# Patient Record
Sex: Female | Born: 1950
Health system: Southern US, Community
[De-identification: ages and names within clinical notes are randomized; demographics above are authoritative.]

## PROBLEM LIST (undated history)

## (undated) DIAGNOSIS — F419 Anxiety disorder, unspecified: Secondary | ICD-10-CM

## (undated) DIAGNOSIS — I10 Essential (primary) hypertension: Secondary | ICD-10-CM

## (undated) DIAGNOSIS — F329 Major depressive disorder, single episode, unspecified: Secondary | ICD-10-CM

## (undated) DIAGNOSIS — M1990 Unspecified osteoarthritis, unspecified site: Secondary | ICD-10-CM

## (undated) DIAGNOSIS — M549 Dorsalgia, unspecified: Secondary | ICD-10-CM

## (undated) DIAGNOSIS — F410 Panic disorder [episodic paroxysmal anxiety] without agoraphobia: Secondary | ICD-10-CM

## (undated) DIAGNOSIS — M5020 Other cervical disc displacement, unspecified cervical region: Secondary | ICD-10-CM

## (undated) DIAGNOSIS — M961 Postlaminectomy syndrome, not elsewhere classified: Secondary | ICD-10-CM

## (undated) DIAGNOSIS — F32A Depression, unspecified: Secondary | ICD-10-CM

## (undated) DIAGNOSIS — I714 Abdominal aortic aneurysm, without rupture, unspecified: Secondary | ICD-10-CM

## (undated) DIAGNOSIS — E7800 Pure hypercholesterolemia, unspecified: Secondary | ICD-10-CM

## (undated) DIAGNOSIS — M48061 Spinal stenosis, lumbar region without neurogenic claudication: Secondary | ICD-10-CM

## (undated) HISTORY — PX: APPENDECTOMY: SHX54

## (undated) HISTORY — PX: CARPAL TUNNEL RELEASE: SHX101

## (undated) HISTORY — DX: Other cervical disc displacement, unspecified cervical region: M50.20

## (undated) HISTORY — DX: Depression, unspecified: F32.A

## (undated) HISTORY — PX: BACK SURGERY: SHX140

## (undated) HISTORY — DX: Pure hypercholesterolemia, unspecified: E78.00

## (undated) HISTORY — DX: Abdominal aortic aneurysm, without rupture, unspecified: I71.40

## (undated) HISTORY — DX: Panic disorder (episodic paroxysmal anxiety): F41.0

## (undated) HISTORY — PX: OTHER SURGICAL HISTORY: SHX169

## (undated) HISTORY — DX: Abdominal aortic aneurysm, without rupture: I71.4

---
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
## 1998-09-17 ENCOUNTER — Ambulatory Visit (HOSPITAL_COMMUNITY): Admission: RE | Admit: 1998-09-17 | Discharge: 1998-09-17 | Payer: Self-pay | Admitting: Family Medicine

## 1998-09-17 ENCOUNTER — Encounter: Payer: Self-pay | Admitting: Family Medicine

## 2000-05-05 ENCOUNTER — Ambulatory Visit (HOSPITAL_COMMUNITY): Admission: RE | Admit: 2000-05-05 | Discharge: 2000-05-05 | Payer: Self-pay | Admitting: Obstetrics and Gynecology

## 2000-05-05 ENCOUNTER — Other Ambulatory Visit: Admission: RE | Admit: 2000-05-05 | Discharge: 2000-05-05 | Payer: Self-pay | Admitting: Obstetrics and Gynecology

## 2000-05-05 ENCOUNTER — Encounter: Payer: Self-pay | Admitting: Obstetrics and Gynecology

## 2001-03-05 ENCOUNTER — Other Ambulatory Visit: Admission: RE | Admit: 2001-03-05 | Discharge: 2001-03-05 | Payer: Self-pay | Admitting: Obstetrics and Gynecology

## 2001-03-05 ENCOUNTER — Encounter (INDEPENDENT_AMBULATORY_CARE_PROVIDER_SITE_OTHER): Payer: Self-pay

## 2004-06-12 ENCOUNTER — Encounter: Admission: RE | Admit: 2004-06-12 | Discharge: 2004-06-12 | Payer: Self-pay | Admitting: Family Medicine

## 2007-07-29 ENCOUNTER — Encounter (INDEPENDENT_AMBULATORY_CARE_PROVIDER_SITE_OTHER): Payer: Self-pay | Admitting: General Surgery

## 2007-07-29 ENCOUNTER — Inpatient Hospital Stay (HOSPITAL_COMMUNITY): Admission: EM | Admit: 2007-07-29 | Discharge: 2007-08-07 | Payer: Self-pay | Admitting: Emergency Medicine

## 2007-07-29 ENCOUNTER — Ambulatory Visit (HOSPITAL_COMMUNITY): Admission: RE | Admit: 2007-07-29 | Discharge: 2007-07-29 | Payer: Self-pay | Admitting: Family Medicine

## 2007-08-16 ENCOUNTER — Encounter: Admission: RE | Admit: 2007-08-16 | Discharge: 2007-08-16 | Payer: Self-pay | Admitting: General Surgery

## 2007-09-21 ENCOUNTER — Encounter: Admission: RE | Admit: 2007-09-21 | Discharge: 2007-09-21 | Payer: Self-pay | Admitting: General Surgery

## 2010-12-08 ENCOUNTER — Encounter: Payer: Self-pay | Admitting: Family Medicine

## 2011-04-01 NOTE — H&P (Signed)
Dawn Beasley, Dawn Beasley NO.:  [id number]   MEDICAL RECORD NO.:  [id number]          PATIENT TYPE:  INP   LOCATION:  5707                         FACILITY:  [facility]   PHYSICIAN:  Cherylynn Ridges, M.D.    DATE OF BIRTH:  03/28/1951   DATE OF ADMISSION:  07/29/2007  DATE OF DISCHARGE:                              HISTORY & PHYSICAL   CHIEF COMPLAINT:  The patient is a 60 year old female with abdominal  pain localized in the right lower quadrant and CT scan demonstrating  likely acute appendicitis.   HISTORY OF PRESENT ILLNESS:  The patient started getting sick on Monday  evening.  It was not so severe that she could not go to work on Tuesday,  but did not feel well most of the day, started having symptoms of more  severe diffuse pain across the lower portion of her abdomen, which she  thought may be acute appendicitis.  However, she did not go and see her  primary care physician until Wednesday when the patient had some blood  work done but was not completed.  She was given a Demerol shot and also  some Demerol pills and sent home with a diagnosis of possibly being a  gallstone or a kidney stone, but no definitive diagnosis was made.  She  was sent over to [facility] today for an ultrasound  for possible gallstones.  They suggested in the radiology that the  patient get a CT scan which demonstrated acute appendicitis.   She has had no diarrhea or constipation.  She has had nausea, vomiting,  fevers and chills.   PAST MEDICAL HISTORY:  Pretty much unremarkable except for history of  neck injury years ago treated nonsurgically.   PAST SURGICAL HISTORY:  She has had jaw surgery in the past and only  normal spontaneous vaginal delivery.  She has never had a C section  other abdominal surgery.   MEDICATIONS:  1. Hydrochlorothiazide 25 mg a day which she takes for fluid build up,      not for hypertension.  2. Klonopin 0.5 mg b.i.d. which she takes  for spasms related to her      neck injury.   ALLERGIES:  She is not allergic to SULFA medications.   REVIEW OF SYSTEMS:  No diarrhea or constipation but positive fevers and  chills.   PHYSICAL EXAMINATION:  VITAL SIGNS:  She had a fever of 102.2 when she  was admitted.  She feels warm currently, but her temperature when last  documented was 98.7, pulse 114, blood pressure 111/69.  HEENT:  She is normocephalic and atraumatic and anicteric.  Mucous  membranes dry and parched and appear have of sort of very parched and  furrowed tongue.  NECK:  Supple with no bruits.  No palpable masses.  LUNGS:  Clear to auscultation.  CHEST:  Clear to auscultation and percussion.  CARDIAC:  Regular rhythm and rate with no murmurs, gallops, lifts or  heaves.  She does appear to be tachycardiac.  ABDOMEN:  Distended and bloated, hypoactive bowel sounds.  She has  significant tenderness in the right lower quadrant with guarding and tap  cough tenderness.  RECTAL:  Exam was not performed.   LABORATORY STUDIES:  White count 16.6000.  Her hemoglobin was 14.9,  hematocrit of 42.1.  Her electrolytes all within normal limits.  UA is  negative for urinary tract infection.  She had a chest x-ray which did  not show any significant infiltrates or abnormalities.   EKG shows sinus tachycardia only.   I reviewed the CT scan which demonstrates an enlarged appendix extending  onto the pelvis.  There is a small amount of fluid in the cul-de-sac.   IMPRESSION:  Acute appendicitis.   PLAN:  A laparoscopic appendectomy with possible procedure, this has  been discussed with the patient and her family.  We will be doing this  as soon as possible.      Cherylynn Ridges, M.D.  Electronically Signed     JOW/MEDQ  D:  07/29/2007  T:  07/30/2007  Job:  [id number]

## 2011-04-01 NOTE — Op Note (Signed)
NAMEROLANDO, WHITBY NO.:  [id number]   MEDICAL RECORD NO.:  [id number]          PATIENT TYPE:  INP   LOCATION:  5707                         FACILITY:  [facility]   PHYSICIAN:  Cherylynn Ridges, M.D.    DATE OF BIRTH:  09-24-1951   DATE OF PROCEDURE:  DATE OF DISCHARGE:                               OPERATIVE REPORT   PREOPERATIVE DIAGNOSIS:  Acute appendicitis.   POSTOPERATIVE DIAGNOSIS:  Acute appendicitis, gangrenous with rupture.   PROCEDURE:  Laparoscopic appendectomy.   SURGEON:  Cherylynn Ridges, M.D.   ANESTHESIA:  General endotracheal.   ESTIMATED BLOOD LOSS:  Less than 20 mL.   COMPLICATIONS:  None.   CONDITION:  Stable.   FINDINGS:  The patient had a gangrenous appendix with pus in the pelvis  and a small amount of feculent drainage along the right paracolic area.   INDICATIONS FOR OPERATION:  The patient is a 60 year old with abdominal  pain, now localized to the right lower quadrant and a CT demonstrating  acute appendicitis.   OPERATION:  The patient was taken to the operating room, placed on the  table in the supine position.  After an adequate general endotracheal  anesthesia was administered, she was prepped and draped in the usual  sterile manner exposing the midline and the right lower quadrant.   A supraumbilical curvilinear incision was made using a #11 blade and  taken down into the midline fascia.  We incised the midline fascia  longitudinally and then grabbed the edges with Kocher clamps.  We then  dissected down bluntly into the peritoneal cavity using a Kelly clamp  and then passed a pursestring suture of 0 Vicryl around the fascial  opening.  This secured and a Hassan cannula, which was then passed  through the supraumbilical fascia into the peritoneal cavity and secured  it in place with a pursestring suture.   Carbon dioxide gas was insufflated into the peritoneal cavity up to a  maximum intraabdominal pressure of 50 mmHg.  Once  this was done, a right  costal margin 5-mm cannula and subsequently a suprapubic slightly to the  left of midline 12-mm cannula were passed under direct vision into the  peritoneal cavity.   Immediately upon entering the peritoneal cavity and placing the patient  in Trendelenburg, the left side was tilted down.  There were dilated  small bowel and large bowel loops around the appendix.  There was also  some omentum attached to it, helped to keep this area of inflammation  sealed off.  There was pus in the pelvis, which we aspirated.  We were  able to mobilize the appendix by digging down into the pelvis behind the  round ligament towards the ovary.  We mobilized the appendix up into the  wound, made a window between the mesoappendix and the base of the cecum  using a dissector and then passing into a GIA 3.5-mm closure staple  across the base of the appendix at the cecum.  Once we fired the GIA,  this detached the appendix from the cecum.  We passed  a 2.5-mm closure  staple across the mesoappendix, controlling bleeding from the  mesoappendix.  We removed the appendix from the suprapubic site using an  EndoCatch bag with minimal difficulty.  We then spent the rest of the  case irrigating out the pelvis, the right paracolic area and right upper  quadrant using about 5 liters of saline solution.  There was no purulent  drainage left after we had irrigated; however, the patient is still at  risk for postoperative infection and mainly an abscess in the pelvis or  right paracolic area.   Once we had aspirated and all fluid and gas from above the liver and in  the pelvis, we removed all cannulas.  We closed the supraumbilical site  using a pursestring suture which was in place.  We used a 0.25% Marcaine  with epinephrine to inject at all sites.  Then we closed the skin using  a running subcuticular stitch of 5-0 Vicryl.  Sterile dressings were  applied.  All needle count, sponge counts and  instrument counts were  correct.      Cherylynn Ridges, M.D.  Electronically Signed     JOW/MEDQ  D:  07/29/2007  T:  07/30/2007  Job:  [id number]

## 2011-04-01 NOTE — Discharge Summary (Signed)
NAMEYOMARIS, PALECEK NO.:  [id number]   MEDICAL RECORD NO.:  [id number]          PATIENT TYPE:  INP   LOCATION:  5707                         FACILITY:  [facility]   PHYSICIAN:  Angelia Mould. Derrell Lolling, M.D.DATE OF BIRTH:  06-01-1951   DATE OF ADMISSION:  07/29/2007  DATE OF DISCHARGE:  08/07/2007                               DISCHARGE SUMMARY   CHIEF COMPLAINT/REASON FOR ADMISSION:  Dawn Beasley is a 60 year old  female patient who presented to the ER with right lower quadrant  abdominal pain which had been ongoing for several days.  Past history  significant for hypertension.  In the ER she initially presented with  high fever 102.2, but had defervesced by the time Dr. Lindie Spruce examined  her.  Her abdomen was distended and bloated with hypoactive bowel sounds  with significant tenderness in the right lower quadrant with guarding  and rebounding.  White count was 16,600.  CT scan demonstrated an  enlarged appendix extending into the pelvis with a small amount of fluid  in cul-de-sac.  The patient was admitted with a diagnosis of acute  appendicitis.   HOSPITAL COURSE:  The patient was taken from the ER to the OR by Dr.  Lindie Spruce where she underwent laparoscopic appendectomy for gangrenous  appendix with rupture.  She was sent back to the general floor to  recover with IV fluids and empiric Unasyn, gentamicin and Flagyl.   By postop day #1 the patient was afebrile, mildly tachycardiac.  Her  abdomen was distended and tympanitic with very little bowel sounds  auscultated.  She was extremely tender diffusely.  Her white count had  peaked at 26,700 with neutrophils of 97%.  Toradol was added for pain  and because of her tachycardia her fluids were increased to 150 an hour.   For the next several days the patient continued to slowly progress.  She  had an obvious ileus by clinical exam, but with the addition of Toradol  her pain was much better controlled.  By postoperative day #3  she was  started on a clear liquid diet.   By postoperative day #4 the patient once again developed tachycardia and  fever up to 101.  Her white count had further decreased to 14,700.  Her  neutrophils were 87%.  She was tolerating a full liquid diet with no  abdominal pain.  She was complaining of the itching from the Percocet.  Plans were to advance her to regular diet, change her over to Vicodin  and hopefully send her home in a few more days if she was stable on oral  Augmentin.   The patient by postoperative day #5 began complaining of gas and  bloating without emesis.  This was after she attempted more solid diet  with roughage.  Her abdomen was round and bloated, but nontender and  high-pitched bowel sounds.  Plain x-rays were unrevealing.  Her white  count continued to increase by the following day postop day #6 and she  had a T-max of 103.4.  CT abdomen and pelvis was obtained and this did  reveal  a cul-de-sac abscess.  Her Augmentin had been changed over to  Primaxin IV.   On postoperative day #7 the patient underwent placement of a  percutaneous drain transgluteal to the cul-de-sac abscess in  intervention radiology.  30 mL of red purulent fluid was obtained and a  drain was left in place.  Over the next several days the patient  continued to improve.  She began having a solid BMs.  Her abdomen was  soft, nondistended.  Bowel sounds were more appropriate.  She had some  difficulty with hypokalemia and this was repleted orally.  She was  changed over to p.o. Augmentin.  By postoperative day #8 she was doing  quite well.  She still had thin cloudy drainage 60 mL over a 24 hour  period through the transgluteal drain.  She was having bowel movements  and tolerating a diet.  Her abdomen was otherwise unremarkable.  She was  deemed appropriate for discharge home with the drain in place with  follow-up CT to be determined by Dr. Lindie Spruce at follow-up.  She was sent  out on Augmentin  and Vicodin.   DISCHARGE DIAGNOSES:  1. Abdominal pain and fever secondary to perforated appendix.  2. Status post laparoscopic appendectomy for perforated gangrenous      appendix.  3. Postoperative ileus, resolved.  4. Cul-de-sac abscess status post percutaneous drainage in      interventional radiology.   DISCHARGE MEDICATIONS:  1. Hydrochlorothiazide daily.  2. Klonopin 0.5 mg daily.  3. Xanax 1 mg 1/2 tablet p.r.n.  4. New medications include over-the-counter Benadryl at bedtime for      sleep.  5. Vicodin 5/325 1-2 tablets every 4 hours as needed for pain.  6. Over-the-counter ibuprofen 2-3 tablets every 8 hours in addition to      her Vicodin.  7. Augmentin 875 mg b.i.d. for 7 days.   OTHER INSTRUCTIONS:  Return to work:  Please see work note.  Diet:  No  restrictions.  Wound care:  Flush the percutaneous drain daily with 10  mL of sterile saline.  Allow Steri-Strips to fall off.  Activity:  Increase activity slowly.  May walk up steps.  May shower.  No lifting  greater than 15 pounds for 2 weeks.  No driving for 2 weeks or while  taking Vicodin.  Follow up with Dr. Lindie Spruce in 1 week, you need to call  for an appointment.      Allison L. Rennis Harding, N.P.      Angelia Mould. Derrell Lolling, M.D.  Electronically Signed    ALE/MEDQ  D:  09/07/2007  T:  09/08/2007  Job:  [id number]   cc:   Cherylynn Ridges, M.D.

## 2011-08-28 LAB — URINALYSIS, ROUTINE W REFLEX MICROSCOPIC
Bilirubin Urine: NEGATIVE
Ketones, ur: NEGATIVE
Nitrite: NEGATIVE
Specific Gravity, Urine: 1.008
Urobilinogen, UA: 0.2

## 2011-08-28 LAB — DIFFERENTIAL
Basophils Absolute: 0
Basophils Relative: 0
Basophils Relative: 0
Eosinophils Absolute: 0.1
Eosinophils Absolute: 0.2
Eosinophils Relative: 1
Lymphocytes Relative: 7 — ABNORMAL LOW
Lymphs Abs: 1
Lymphs Abs: 1.8
Monocytes Absolute: 1.6 — ABNORMAL HIGH
Monocytes Relative: 10
Monocytes Relative: 6
Neutro Abs: 12.8 — ABNORMAL HIGH
Neutrophils Relative %: 78 — ABNORMAL HIGH
Neutrophils Relative %: 84 — ABNORMAL HIGH
Neutrophils Relative %: 87 — ABNORMAL HIGH

## 2011-08-28 LAB — BASIC METABOLIC PANEL
BUN: 2 — ABNORMAL LOW
BUN: 5 — ABNORMAL LOW
CO2: 27
CO2: 32
Calcium: 8 — ABNORMAL LOW
Calcium: 8.3 — ABNORMAL LOW
Chloride: 88 — ABNORMAL LOW
Chloride: 91 — ABNORMAL LOW
Creatinine, Ser: 0.84
Creatinine, Ser: 0.89
Creatinine, Ser: 1.06
GFR calc Af Amer: >60
GFR calc non Af Amer: >60
Glucose, Bld: 109 — ABNORMAL HIGH
Glucose, Bld: 129 — ABNORMAL HIGH
Potassium: 2.8 — ABNORMAL LOW
Sodium: 135

## 2011-08-28 LAB — CBC
HCT: 30.4 — ABNORMAL LOW
HCT: 30.5 — ABNORMAL LOW
HCT: 32.1 — ABNORMAL LOW
Hemoglobin: 10.5 — ABNORMAL LOW
Hemoglobin: 9.7 — ABNORMAL LOW
MCHC: 34.6
MCHC: 35
MCHC: 35.6
MCV: 95.3
MCV: 96.1
MCV: 96.2
MCV: 98.4
Platelets: 352
Platelets: 446 — ABNORMAL HIGH
Platelets: 641 — ABNORMAL HIGH
RBC: 3.09 — ABNORMAL LOW
RBC: 3.14 — ABNORMAL LOW
RBC: 3.34 — ABNORMAL LOW
RDW: 13.8
RDW: 13.9
WBC: 14 — ABNORMAL HIGH
WBC: 14.8 — ABNORMAL HIGH
WBC: 16.4 — ABNORMAL HIGH

## 2011-08-28 LAB — CULTURE, ROUTINE-ABSCESS

## 2011-08-28 LAB — CULTURE, BLOOD (ROUTINE X 2)

## 2011-08-28 LAB — CLOSTRIDIUM DIFFICILE EIA: C difficile Toxins A+B, EIA: NEGATIVE

## 2011-08-28 LAB — URINE CULTURE: Culture: NO GROWTH

## 2011-08-28 LAB — URINE MICROSCOPIC-ADD ON

## 2011-08-29 LAB — URINALYSIS, ROUTINE W REFLEX MICROSCOPIC
Nitrite: NEGATIVE
Specific Gravity, Urine: 1.023
Urobilinogen, UA: 1

## 2011-08-29 LAB — BASIC METABOLIC PANEL
BUN: 5 — ABNORMAL LOW
CO2: 26
Calcium: 8 — ABNORMAL LOW
Calcium: 8.1 — ABNORMAL LOW
Creatinine, Ser: 0.98
GFR calc non Af Amer: 59 — ABNORMAL LOW
Glucose, Bld: 108 — ABNORMAL HIGH
Sodium: 130 — ABNORMAL LOW

## 2011-08-29 LAB — CBC
HCT: 30.6 — ABNORMAL LOW
Hemoglobin: 10.6 — ABNORMAL LOW
Hemoglobin: 14.9
MCHC: 34.6
MCHC: 35.4
MCV: 96.2
MCV: 96.8
MCV: 98.1
Platelets: 388
RBC: 4.36
RDW: 13.3
WBC: 26.7 — ABNORMAL HIGH

## 2011-08-29 LAB — COMPREHENSIVE METABOLIC PANEL
ALT: 23
AST: 40 — ABNORMAL HIGH
Albumin: 4.4
CO2: 28
Calcium: 9.6
Chloride: 93 — ABNORMAL LOW
Creatinine, Ser: 1
Creatinine, Ser: 1.22 — ABNORMAL HIGH
GFR calc Af Amer: >60
GFR calc non Af Amer: 46 — ABNORMAL LOW
Glucose, Bld: 148 — ABNORMAL HIGH
Sodium: 133 — ABNORMAL LOW
Total Bilirubin: 0.6

## 2011-08-29 LAB — DIFFERENTIAL
Basophils Absolute: 0
Basophils Absolute: 0
Basophils Relative: 0
Eosinophils Absolute: 0.1
Eosinophils Relative: 0
Eosinophils Relative: 0
Eosinophils Relative: 1
Lymphocytes Relative: 6 — ABNORMAL LOW
Lymphs Abs: 1.6
Monocytes Absolute: 0.3
Monocytes Absolute: 0.4
Monocytes Absolute: 1.1 — ABNORMAL HIGH
Monocytes Relative: 2 — ABNORMAL LOW
Monocytes Relative: 4
Neutro Abs: 12.9 — ABNORMAL HIGH
Neutro Abs: 24 — ABNORMAL HIGH
Neutrophils Relative %: 92 — ABNORMAL HIGH
WBC Morphology: INCREASED

## 2011-08-29 LAB — GENTAMICIN LEVEL, RANDOM: Gentamicin Rm: 3.5

## 2011-08-29 LAB — URINE MICROSCOPIC-ADD ON

## 2011-08-29 LAB — AMYLASE: Amylase: 68

## 2011-08-29 LAB — LIPASE, BLOOD: Lipase: 50

## 2012-09-24 ENCOUNTER — Ambulatory Visit: Payer: Self-pay | Admitting: Internal Medicine

## 2014-01-17 ENCOUNTER — Other Ambulatory Visit: Payer: Self-pay | Admitting: Internal Medicine

## 2014-01-17 MED ORDER — PROMETHAZINE HCL 25 MG RE SUPP: 25.0000 mg | Freq: Four times a day (QID) | RECTAL | Status: DC | PRN

## 2014-03-14 ENCOUNTER — Other Ambulatory Visit: Payer: Self-pay | Admitting: Specialist

## 2014-03-14 DIAGNOSIS — M48061 Spinal stenosis, lumbar region without neurogenic claudication: Secondary | ICD-10-CM

## 2014-03-16 ENCOUNTER — Ambulatory Visit
Admission: RE | Admit: 2014-03-16 | Discharge: 2014-03-16 | Disposition: A | Discharge status: Home / Self Care | Payer: BC Managed Care – PPO | Source: Ambulatory Visit | Attending: Specialist | Admitting: Specialist

## 2014-03-16 ENCOUNTER — Inpatient Hospital Stay
Admission: RE | Admit: 2014-03-16 | Discharge: 2014-03-16 | Disposition: A | Discharge status: Home / Self Care | Payer: Self-pay | Source: Ambulatory Visit | Attending: Specialist | Admitting: Specialist

## 2014-03-16 ENCOUNTER — Other Ambulatory Visit: Payer: Self-pay | Admitting: Specialist

## 2014-03-16 VITALS — BP 178/87 | HR 100

## 2014-03-16 DIAGNOSIS — M48061 Spinal stenosis, lumbar region without neurogenic claudication: Secondary | ICD-10-CM

## 2014-03-16 MED ORDER — IOHEXOL 180 MG/ML  SOLN
17.0000 mL | Freq: Once | INTRAMUSCULAR | Status: AC | PRN
  Administered 2014-03-16: 17 mL via INTRATHECAL

## 2014-03-16 MED ORDER — OXYCODONE-ACETAMINOPHEN 5-325 MG PO TABS
2.0000 | ORAL_TABLET | Freq: Once | ORAL | Status: AC
  Administered 2014-03-16: 2 via ORAL

## 2014-03-16 MED ORDER — DIAZEPAM 5 MG PO TABS
10.0000 mg | ORAL_TABLET | Freq: Once | ORAL | Status: AC
  Administered 2014-03-16: 10 mg via ORAL

## 2014-03-16 NOTE — Discharge Instructions (Signed)

## 2014-03-31 ENCOUNTER — Other Ambulatory Visit: Payer: Self-pay | Admitting: Orthopedic Surgery

## 2014-03-31 NOTE — Progress Notes (Signed)
Need orders in EPIC please - pt coming for preop 04/06/14 thank you

## 2014-04-03 ENCOUNTER — Encounter (HOSPITAL_COMMUNITY): Payer: Self-pay | Admitting: Pharmacy Technician

## 2014-04-05 ENCOUNTER — Other Ambulatory Visit: Payer: Self-pay | Admitting: Orthopedic Surgery

## 2014-04-05 NOTE — H&P (Signed)
Dawn Beasley is an 63 y.o. female.   Chief Complaint: back and leg pain HPI: The patient is a 63 year old female who presents today for follow up of their back. The patient is being followed for their back pain. They are now 2 year(s) out from when symptoms began. Symptoms reported today include: pain (low back) and leg pain (bilateral). Current treatment includes: NSAIDs (Ibuprofen). The following medication has been used for pain control: Norco. The patient reports their current pain level to be moderate to severe. The patient presents today following CT/Myelogram.  Dawn Beasley follows up. She reports persistent pain and request a refill of Norco. She has had a CT myelogram. Two years of symptoms gradually worsening. When she is upright walking, her legs feel heavy into the calves. She has to sit. She has been doing an exercise program and activity modification. No fevers, chills or change in bowel or bladder function. There is no pain which awakens him at night or unexplained recent weight loss.  No past medical history on file.  No past surgical history on file.  No family history on file. Social History:  has no tobacco, alcohol, and drug history on file.  Allergies:  Allergies  Allergen Reactions  . Celebrex [Celecoxib] Anaphylaxis  . Sulfa Antibiotics Swelling     (Not in a hospital admission)  No results found for this or any previous visit (from the past 48 hour(s)). No results found.  Review of Systems  Constitutional: Negative.   HENT: Negative.   Eyes: Negative.   Respiratory: Negative.   Cardiovascular: Negative.   Gastrointestinal: Negative.   Genitourinary: Negative.   Musculoskeletal: Positive for back pain.  Skin: Negative.   Neurological: Positive for focal weakness.  Psychiatric/Behavioral: Negative.     There were no vitals taken for this visit. Physical Exam  Constitutional: She is oriented to person, place, and time. She appears well-developed and  well-nourished.  HENT:  Head: Normocephalic and atraumatic.  Eyes: Conjunctivae and EOM are normal. Pupils are equal, round, and reactive to light.  Neck: Normal range of motion. Neck supple.  Cardiovascular: Normal rate and regular rhythm.   Respiratory: Effort normal and breath sounds normal.  GI: Soft. Bowel sounds are normal.  Musculoskeletal:  She is upright. Mild distress. Mood and affect appropriate. She walks with a slight antalgic gait, forward flexed. Slight quadriceps weakness is noted bilaterally. Pulses are intact. No DVT. No instability of the hips, knees and ankles.   Neurological: She is alert and oriented to person, place, and time. She has normal reflexes.  Skin: Skin is warm and dry.  Psychiatric: She has a normal mood and affect.    I reviewed her myelogram. Again, when she is upright in the extended position there is a near complete block in extension. It is relieved with forward flexion. This is predominantly at L4-5. She does have some significant lateral recess stenosis at L3-4.  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 Moderate to severe at L3-4 requiring narcotic analgesics two years duration, progressively worse.  We discussed options of living with her symptoms ,which she does not want to do,she can not live with her symptoms, forward flexed gait , avoid extension and favor flexion. We discussed intermittent epidural steroid injections and she has declined. Possible surgical option of decompression at L4-5, perhaps extend to L3-4.  I had an extensive discussion of the risks and benefits of the lumbar  decompression with the patient including bleeding, infection, damage to neurovascular structures, epidural fibrosis, CSF leak requiring repair. We also discussed increase in pain, adjacent segment disease, recurrent disc herniation, need for future surgery  including repeat decompression and/or fusion. We also discussed risks of postoperative hematoma, paralysis, anesthetic complications including DVT, PE, death, cardiopulmonary dysfunction. In addition, the perioperative and postoperative courses were discussed in detail including the rehabilitative time and return to functional activity and work. I provided the patient with an illustrated handout and utilized the appropriate surgical models.  She would like to proceed with that. 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 She would likely have residual symptoms related to that. She understands.  Her son is a Proofreadernuclear engineer. I would be happy to discuss her condition with her. I will see her back following that. I will keep her out of work. She is unable to continue to work.  She will require preoperative clearance. No history of MRSA. Again, she declined epidurals, dosepak, etc.  Dayna BarkerJaclyn M. Bissell PA-C for Dr. Shelle IronBeane 04/05/2014, 8:39 PM

## 2014-04-05 NOTE — Patient Instructions (Addendum)
Dawn Beasley  04/05/2014                           YOUR PROCEDURE IS SCHEDULED ON: 04/12/14 AT 7:30 AM               ENTER THRU [facility] ENTRANCE AND                            FOLLOW  SIGNS TO SHORT STAY CENTER                 ARRIVE AT SHORT STAY AT: 5:30 AM               CALL THIS NUMBER IF ANY PROBLEMS THE DAY OF SURGERY :               [phone]                                REMEMBER:   Do not eat food or drink liquids AFTER MIDNIGHT             Take these medicines the morning of surgery with               A SIPS OF WATER :  ALPRAZOLAM / KLONOPIN / MAY TAKE HYDROCODONE IF NEEDED       Do not wear jewelry, make-up   Do not wear lotions, powders, or perfumes.   Do not shave legs or underarms 12 hrs. before surgery (men may shave face)  Do not bring valuables to the hospital.  Contacts, dentures or bridgework may not be worn into surgery.  Leave suitcase in the car. After surgery it may be brought to your room.  For patients admitted to the hospital more than one night, checkout time is            11:00 AM                                                       The day of discharge.   Patients discharged the day of surgery will not be allowed to drive home.            If going home same day of surgery, must have someone stay with you              FIRST 24 hrs at home and arrange for some one to drive you              home from hospital.   ________________________________________________________________________                                                                        [facility] - PREPARING FOR SURGERY  Before surgery, you can play an important role.  Because skin is not sterile, your skin needs to be as free of germs as possible.  You can reduce the number of  germs on your skin by washing with CHG (chlorahexidine gluconate) soap before surgery.  CHG is an antiseptic cleaner which kills germs and bonds with the skin to continue  killing germs even after washing. Please DO NOT use if you have an allergy to CHG or antibacterial soaps.  If your skin becomes reddened/irritated stop using the CHG and inform your nurse when you arrive at Short Stay. Do not shave (including legs and underarms) for at least 48 hours prior to the first CHG shower.  You may shave your face. Please follow these instructions carefully:   1.  Shower with CHG Soap the night before surgery and the  morning of Surgery.   2.  If you choose to wash your hair, wash your hair first as usual with your  normal  Shampoo.   3.  After you shampoo, rinse your hair and body thoroughly to remove the  shampoo.                                         4.  Use CHG as you would any other liquid soap.  You can apply chg directly  to the skin and wash . Gently wash with scrungie or clean wascloth    5.  Apply the CHG Soap to your body ONLY FROM THE NECK DOWN.   Do not use on open                           Wound or open sores. Avoid contact with eyes, ears mouth and genitals (private parts).                        Genitals (private parts) with your normal soap.              6.  Wash thoroughly, paying special attention to the area where your surgery  will be performed.   7.  Thoroughly rinse your body with warm water from the neck down.   8.  DO NOT shower/wash with your normal soap after using and rinsing off  the CHG Soap .                9.  Pat yourself dry with a clean towel.             10.  Wear clean pajamas.             11.  Place clean sheets on your bed the night of your first shower and do not  sleep with pets.  Day of Surgery : Do not apply any lotions/deodorants the morning of surgery.  Please wear clean clothes to the hospital/surgery center.  FAILURE TO FOLLOW THESE INSTRUCTIONS MAY RESULT IN THE CANCELLATION OF YOUR SURGERY    PATIENT SIGNATURE_________________________________     Incentive Spirometer  An incentive spirometer is a  tool that can help keep your lungs clear and active. This tool measures how well you are filling your lungs with each breath. Taking long deep breaths may help reverse or decrease the chance of developing breathing (pulmonary) problems (especially infection) following:  A long period of time when you are unable to move or be active. BEFORE THE PROCEDURE   If the spirometer includes an indicator to show your best effort, your nurse or respiratory therapist will set it to a  desired goal.  If possible, sit up straight or lean slightly forward. Try not to slouch.  Hold the incentive spirometer in an upright position. INSTRUCTIONS FOR USE  1. Sit on the edge of your bed if possible, or sit up as far as you can in bed or on a chair. 2. Hold the incentive spirometer in an upright position. 3. Breathe out normally. 4. Place the mouthpiece in your mouth and seal your lips tightly around it. 5. Breathe in slowly and as deeply as possible, raising the piston or the ball toward the top of the column. 6. Hold your breath for 3-5 seconds or for as long as possible. Allow the piston or ball to fall to the bottom of the column. 7. Remove the mouthpiece from your mouth and breathe out normally. 8. Rest for a few seconds and repeat Steps 1 through 7 at least 10 times every 1-2 hours when you are awake. Take your time and take a few normal breaths between deep breaths. 9. The spirometer may include an indicator to show your best effort. Use the indicator as a goal to work toward during each repetition. 10. After each set of 10 deep breaths, practice coughing to be sure your lungs are clear. If you have an incision (the cut made at the time of surgery), support your incision when coughing by placing a pillow or rolled up towels firmly against it. Once you are able to get out of bed, walk around indoors and cough well. You may stop using the incentive spirometer when instructed by your caregiver.  RISKS AND  COMPLICATIONS  Take your time so you do not get dizzy or light-headed.  If you are in pain, you may need to take or ask for pain medication before doing incentive spirometry. It is harder to take a deep breath if you are having pain. AFTER USE  Rest and breathe slowly and easily.  It can be helpful to keep track of a log of your progress. Your caregiver can provide you with a simple table to help with this. If you are using the spirometer at home, follow these instructions: SEEK MEDICAL CARE IF:   You are having difficultly using the spirometer.  You have trouble using the spirometer as often as instructed.  Your pain medication is not giving enough relief while using the spirometer.  You develop fever of 100.5 F (38.1 C) or higher. SEEK IMMEDIATE MEDICAL CARE IF:   You cough up bloody sputum that had not been present before.  You develop fever of 102 F (38.9 C) or greater.  You develop worsening pain at or near the incision site. MAKE SURE YOU:   Understand these instructions.  Will watch your condition.  Will get help right away if you are not doing well or get worse. Document Released: 03/16/2007 Document Revised: 01/26/2012 Document Reviewed: 05/17/2007 [facility] Patient Information 2014 [place], [place].   ________________________________________________________________________

## 2014-04-06 ENCOUNTER — Ambulatory Visit (HOSPITAL_COMMUNITY)
Admission: RE | Admit: 2014-04-06 | Discharge: 2014-04-06 | Disposition: A | Discharge status: Home / Self Care | Payer: BC Managed Care – PPO | Source: Ambulatory Visit | Attending: Anesthesiology | Admitting: Anesthesiology

## 2014-04-06 ENCOUNTER — Other Ambulatory Visit: Payer: Self-pay

## 2014-04-06 ENCOUNTER — Encounter (HOSPITAL_COMMUNITY)
Admission: RE | Admit: 2014-04-06 | Discharge: 2014-04-06 | Disposition: A | Discharge status: Home / Self Care | Payer: BC Managed Care – PPO | Source: Ambulatory Visit | Attending: Specialist | Admitting: Specialist

## 2014-04-06 ENCOUNTER — Encounter (HOSPITAL_COMMUNITY): Payer: Self-pay

## 2014-04-06 DIAGNOSIS — Z01818 Encounter for other preprocedural examination: Secondary | ICD-10-CM | POA: Insufficient documentation

## 2014-04-06 DIAGNOSIS — F17200 Nicotine dependence, unspecified, uncomplicated: Secondary | ICD-10-CM | POA: Insufficient documentation

## 2014-04-06 DIAGNOSIS — Z01810 Encounter for preprocedural cardiovascular examination: Secondary | ICD-10-CM | POA: Insufficient documentation

## 2014-04-06 DIAGNOSIS — Z01812 Encounter for preprocedural laboratory examination: Secondary | ICD-10-CM | POA: Insufficient documentation

## 2014-04-06 HISTORY — DX: Spinal stenosis, lumbar region without neurogenic claudication: M48.061

## 2014-04-06 HISTORY — DX: Essential (primary) hypertension: I10

## 2014-04-06 HISTORY — DX: Dorsalgia, unspecified: M54.9

## 2014-04-06 HISTORY — DX: Anxiety disorder, unspecified: F41.9

## 2014-04-06 HISTORY — DX: Unspecified osteoarthritis, unspecified site: M19.90

## 2014-04-06 LAB — CBC
HEMATOCRIT: 42.7 % (ref 36.0–46.0)
HEMOGLOBIN: 14.6 g/dL (ref 12.0–15.0)
MCH: 35.6 pg — AB (ref 26.0–34.0)
MCHC: 34.2 g/dL (ref 30.0–36.0)
MCV: 104.1 fL — AB (ref 78.0–100.0)
Platelets: 271 10*3/uL (ref 150–400)
RBC: 4.1 MIL/uL (ref 3.87–5.11)
RDW: 14.2 % (ref 11.5–15.5)
WBC: 6.5 10*3/uL (ref 4.0–10.5)

## 2014-04-06 LAB — BASIC METABOLIC PANEL
BUN: 12 mg/dL (ref 6–23)
CHLORIDE: 97 meq/L (ref 96–112)
CO2: 29 meq/L (ref 19–32)
Calcium: 9.6 mg/dL (ref 8.4–10.5)
Creatinine, Ser: 0.78 mg/dL (ref 0.50–1.10)
GFR calc Af Amer: >90 mL/min (ref >90)
GFR calc non Af Amer: 88 mL/min — ABNORMAL LOW (ref >90)
Glucose, Bld: 98 mg/dL (ref 70–99)
POTASSIUM: 4.7 meq/L (ref 3.7–5.3)
SODIUM: 139 meq/L (ref 137–147)

## 2014-04-06 LAB — SURGICAL PCR SCREEN
MRSA, PCR: NEGATIVE
Staphylococcus aureus: NEGATIVE

## 2014-04-06 NOTE — Progress Notes (Signed)
04/06/14 1030  OBSTRUCTIVE SLEEP APNEA  Have you ever been diagnosed with sleep apnea through a sleep study? No  Do you snore loudly (loud enough to be heard through closed doors)?  1  Do you often feel tired, fatigued, or sleepy during the daytime? 1  Has anyone observed you stop breathing during your sleep? 0  Do you have, or are you being treated for high blood pressure? 1  BMI more than 35 kg/m2? 0  Age over 61 years old? 1  Neck circumference greater than 40 cm/16 inches? 0  Gender: 0  Obstructive Sleep Apnea Score 4  Score 4 or greater  Results sent to PCP   

## 2014-04-06 NOTE — Progress Notes (Signed)
04/06/14 1030  OBSTRUCTIVE SLEEP APNEA  Have you ever been diagnosed with sleep apnea through a sleep study? No  Do you snore loudly (loud enough to be heard through closed doors)?  1  Do you often feel tired, fatigued, or sleepy during the daytime? 1  Has anyone observed you stop breathing during your sleep? 0  Do you have, or are you being treated for high blood pressure? 1  BMI more than 35 kg/m2? 0  Age over 61 years old? 1  Neck circumference greater than 40 cm/16 inches? 0  Gender: 0  Obstructive Sleep Apnea Score 4  Score 4 or greater  Results sent to PCP

## 2014-04-11 NOTE — Anesthesia Preprocedure Evaluation (Addendum)
Anesthesia Evaluation  Patient identified by MRN, date of birth, ID band Patient awake    Reviewed: Allergy & Precautions, H&P , NPO status , Patient's Chart, lab work & pertinent test results  Airway Mallampati: II TM Distance: >3 FB Neck ROM: full    Dental no notable dental hx. (+) Teeth Intact, Dental Advisory Given   Pulmonary neg pulmonary ROS, Current Smoker,  breath sounds clear to auscultation  Pulmonary exam normal       Cardiovascular Exercise Tolerance: Good hypertension, Pt. on medications negative cardio ROS  Rhythm:regular Rate:Normal     Neuro/Psych negative neurological ROS  negative psych ROS   GI/Hepatic negative GI ROS, Neg liver ROS,   Endo/Other  negative endocrine ROS  Renal/GU negative Renal ROS  negative genitourinary   Musculoskeletal   Abdominal   Peds  Hematology negative hematology ROS (+)   Anesthesia Other Findings   Reproductive/Obstetrics negative OB ROS                          Anesthesia Physical Anesthesia Plan  ASA: II  Anesthesia Plan: General   Post-op Pain Management:    Induction: Intravenous  Airway Management Planned: Oral ETT  Additional Equipment:   Intra-op Plan:   Post-operative Plan: Extubation in OR  Informed Consent: I have reviewed the patients History and Physical, chart, labs and discussed the procedure including the risks, benefits and alternatives for the proposed anesthesia with the patient or authorized representative who has indicated his/her understanding and acceptance.   Dental Advisory Given  Plan Discussed with: CRNA and Surgeon  Anesthesia Plan Comments:         Anesthesia Quick Evaluation

## 2014-04-12 ENCOUNTER — Ambulatory Visit (HOSPITAL_COMMUNITY): Payer: BC Managed Care – PPO

## 2014-04-12 ENCOUNTER — Ambulatory Visit (HOSPITAL_COMMUNITY)
Admission: RE | Admit: 2014-04-12 | Discharge: 2014-04-13 | Disposition: A | Discharge status: Home / Self Care | Payer: BC Managed Care – PPO | Source: Ambulatory Visit | Attending: Specialist | Admitting: Specialist

## 2014-04-12 ENCOUNTER — Encounter (HOSPITAL_COMMUNITY): Payer: Self-pay | Admitting: *Deleted

## 2014-04-12 ENCOUNTER — Encounter (HOSPITAL_COMMUNITY): Payer: BC Managed Care – PPO | Admitting: Anesthesiology

## 2014-04-12 ENCOUNTER — Ambulatory Visit (HOSPITAL_COMMUNITY): Payer: BC Managed Care – PPO | Admitting: Anesthesiology

## 2014-04-12 ENCOUNTER — Encounter (HOSPITAL_COMMUNITY)
Admission: RE | Disposition: A | Discharge status: Home / Self Care | Payer: Self-pay | Source: Ambulatory Visit | Attending: Specialist

## 2014-04-12 DIAGNOSIS — M48062 Spinal stenosis, lumbar region with neurogenic claudication: Secondary | ICD-10-CM | POA: Insufficient documentation

## 2014-04-12 DIAGNOSIS — I10 Essential (primary) hypertension: Secondary | ICD-10-CM | POA: Insufficient documentation

## 2014-04-12 DIAGNOSIS — M51379 Other intervertebral disc degeneration, lumbosacral region without mention of lumbar back pain or lower extremity pain: Secondary | ICD-10-CM | POA: Insufficient documentation

## 2014-04-12 DIAGNOSIS — M5137 Other intervertebral disc degeneration, lumbosacral region: Secondary | ICD-10-CM | POA: Insufficient documentation

## 2014-04-12 DIAGNOSIS — M48061 Spinal stenosis, lumbar region without neurogenic claudication: Secondary | ICD-10-CM | POA: Diagnosis present

## 2014-04-12 DIAGNOSIS — F17200 Nicotine dependence, unspecified, uncomplicated: Secondary | ICD-10-CM | POA: Insufficient documentation

## 2014-04-12 HISTORY — PX: LUMBAR LAMINECTOMY/DECOMPRESSION MICRODISCECTOMY: SHX5026

## 2014-04-12 SURGERY — LUMBAR LAMINECTOMY/DECOMPRESSION MICRODISCECTOMY 1 LEVEL
Anesthesia: General | Site: Back

## 2014-04-12 MED ORDER — GLYCOPYRROLATE 0.2 MG/ML IJ SOLN
INTRAMUSCULAR | Status: DC | PRN
  Administered 2014-04-12: 0.4 mg via INTRAVENOUS

## 2014-04-12 MED ORDER — MIDAZOLAM HCL 5 MG/5ML IJ SOLN
INTRAMUSCULAR | Status: DC | PRN
  Administered 2014-04-12: 1 mg via INTRAVENOUS

## 2014-04-12 MED ORDER — TRIAMTERENE-HCTZ 37.5-25 MG PO TABS
1.0000 | ORAL_TABLET | Freq: Every morning | ORAL | Status: DC
  Administered 2014-04-12 – 2014-04-13 (×2): 1 via ORAL
  Filled 2014-04-12 (×2): qty 1

## 2014-04-12 MED ORDER — CEFAZOLIN SODIUM-DEXTROSE 2-3 GM-% IV SOLR
2.0000 g | Freq: Three times a day (TID) | INTRAVENOUS | Status: AC
  Administered 2014-04-12 – 2014-04-13 (×2): 2 g via INTRAVENOUS
  Filled 2014-04-12 (×2): qty 50

## 2014-04-12 MED ORDER — ACETAMINOPHEN 650 MG RE SUPP: 650.0000 mg | RECTAL | Status: DC | PRN

## 2014-04-12 MED ORDER — DEXAMETHASONE SODIUM PHOSPHATE 4 MG/ML IJ SOLN
INTRAMUSCULAR | Status: DC | PRN
  Administered 2014-04-12: 10 mg via INTRAVENOUS

## 2014-04-12 MED ORDER — OXYCODONE-ACETAMINOPHEN 5-325 MG PO TABS
1.0000 | ORAL_TABLET | ORAL | Status: DC | PRN
  Administered 2014-04-12: 2 via ORAL
  Filled 2014-04-12: qty 2

## 2014-04-12 MED ORDER — HYDROMORPHONE HCL PF 1 MG/ML IJ SOLN: 0.2500 mg | INTRAMUSCULAR | Status: DC | PRN

## 2014-04-12 MED ORDER — ACETAMINOPHEN 325 MG PO TABS: 650.0000 mg | ORAL_TABLET | ORAL | Status: DC | PRN

## 2014-04-12 MED ORDER — EPHEDRINE SULFATE 50 MG/ML IJ SOLN
INTRAMUSCULAR | Status: AC
  Filled 2014-04-12: qty 1

## 2014-04-12 MED ORDER — ONDANSETRON HCL 4 MG/2ML IJ SOLN
INTRAMUSCULAR | Status: DC | PRN
  Administered 2014-04-12 (×2): 2 mg via INTRAVENOUS

## 2014-04-12 MED ORDER — HYDROMORPHONE HCL PF 1 MG/ML IJ SOLN
0.5000 mg | INTRAMUSCULAR | Status: DC | PRN
  Administered 2014-04-12 – 2014-04-13 (×2): 1 mg via INTRAVENOUS
  Filled 2014-04-12 (×2): qty 1

## 2014-04-12 MED ORDER — LIDOCAINE HCL (CARDIAC) 20 MG/ML IV SOLN
INTRAVENOUS | Status: DC | PRN
Start: 2014-04-12 — End: 2014-04-12
  Administered 2014-04-12: 30 mg via INTRAVENOUS

## 2014-04-12 MED ORDER — DOCUSATE SODIUM 100 MG PO CAPS
100.0000 mg | ORAL_CAPSULE | Freq: Two times a day (BID) | ORAL | Status: DC
  Administered 2014-04-12 – 2014-04-13 (×2): 100 mg via ORAL

## 2014-04-12 MED ORDER — BUPIVACAINE-EPINEPHRINE 0.5% -1:200000 IJ SOLN
INTRAMUSCULAR | Status: DC | PRN
  Administered 2014-04-12: 8 mL

## 2014-04-12 MED ORDER — NALOXONE HCL 0.4 MG/ML IJ SOLN
INTRAMUSCULAR | Status: DC | PRN
  Administered 2014-04-12: .04 mg via INTRAVENOUS

## 2014-04-12 MED ORDER — SODIUM CHLORIDE 0.9 % IJ SOLN: 3.0000 mL | INTRAMUSCULAR | Status: DC | PRN

## 2014-04-12 MED ORDER — DOCUSATE SODIUM 100 MG PO CAPS: 100.0000 mg | ORAL_CAPSULE | Freq: Two times a day (BID) | ORAL | Status: DC | PRN

## 2014-04-12 MED ORDER — CISATRACURIUM BESYLATE (PF) 10 MG/5ML IV SOLN
INTRAVENOUS | Status: DC | PRN
  Administered 2014-04-12: 4 mg via INTRAVENOUS
  Administered 2014-04-12 (×4): 1 mg via INTRAVENOUS

## 2014-04-12 MED ORDER — NICOTINE 21 MG/24HR TD PT24
21.0000 mg | MEDICATED_PATCH | Freq: Every day | TRANSDERMAL | Status: DC
  Administered 2014-04-12: 21 mg via TRANSDERMAL
  Filled 2014-04-12 (×3): qty 1

## 2014-04-12 MED ORDER — THROMBIN 5000 UNITS EX SOLR
CUTANEOUS | Status: AC
  Filled 2014-04-12: qty 10000

## 2014-04-12 MED ORDER — CEFAZOLIN SODIUM-DEXTROSE 2-3 GM-% IV SOLR
2.0000 g | INTRAVENOUS | Status: AC
  Administered 2014-04-12: 2 g via INTRAVENOUS

## 2014-04-12 MED ORDER — SODIUM CHLORIDE 0.9 % IV SOLN
250.0000 mL | INTRAVENOUS | Status: DC
Start: 2014-04-12 — End: 2014-04-13

## 2014-04-12 MED ORDER — MIDAZOLAM HCL 2 MG/2ML IJ SOLN
INTRAMUSCULAR | Status: AC
  Filled 2014-04-12: qty 2

## 2014-04-12 MED ORDER — LACTATED RINGERS IV SOLN: INTRAVENOUS | Status: DC

## 2014-04-12 MED ORDER — PROPOFOL 10 MG/ML IV BOLUS
INTRAVENOUS | Status: AC
  Filled 2014-04-12: qty 20

## 2014-04-12 MED ORDER — GLYCOPYRROLATE 0.2 MG/ML IJ SOLN
INTRAMUSCULAR | Status: AC
  Filled 2014-04-12: qty 2

## 2014-04-12 MED ORDER — ONDANSETRON HCL 4 MG/2ML IJ SOLN
INTRAMUSCULAR | Status: AC
  Filled 2014-04-12: qty 2

## 2014-04-12 MED ORDER — MENTHOL 3 MG MT LOZG
1.0000 | LOZENGE | OROMUCOSAL | Status: DC | PRN
  Filled 2014-04-12: qty 9

## 2014-04-12 MED ORDER — PHENOL 1.4 % MT LIQD
1.0000 | OROMUCOSAL | Status: DC | PRN
  Filled 2014-04-12: qty 177

## 2014-04-12 MED ORDER — CEFAZOLIN SODIUM-DEXTROSE 2-3 GM-% IV SOLR
INTRAVENOUS | Status: AC
  Filled 2014-04-12: qty 50

## 2014-04-12 MED ORDER — THROMBIN 5000 UNITS EX SOLR
OROMUCOSAL | Status: DC | PRN
  Administered 2014-04-12: 09:00:00 via TOPICAL

## 2014-04-12 MED ORDER — NEOSTIGMINE METHYLSULFATE 10 MG/10ML IV SOLN
INTRAVENOUS | Status: DC | PRN
  Administered 2014-04-12: 3 mg via INTRAVENOUS

## 2014-04-12 MED ORDER — CISATRACURIUM BESYLATE 20 MG/10ML IV SOLN
INTRAVENOUS | Status: AC
  Filled 2014-04-12: qty 10

## 2014-04-12 MED ORDER — SODIUM CHLORIDE 0.9 % IR SOLN
Status: DC | PRN
  Administered 2014-04-12: 08:00:00

## 2014-04-12 MED ORDER — METHOCARBAMOL 1000 MG/10ML IJ SOLN
500.0000 mg | Freq: Four times a day (QID) | INTRAVENOUS | Status: DC | PRN
  Administered 2014-04-12: 500 mg via INTRAVENOUS
  Filled 2014-04-12 (×2): qty 5

## 2014-04-12 MED ORDER — DIPHENHYDRAMINE HCL 25 MG PO CAPS
25.0000 mg | ORAL_CAPSULE | Freq: Four times a day (QID) | ORAL | Status: DC | PRN
  Administered 2014-04-12: 25 mg via ORAL
  Filled 2014-04-12: qty 1

## 2014-04-12 MED ORDER — HYDROMORPHONE HCL PF 1 MG/ML IJ SOLN
INTRAMUSCULAR | Status: DC | PRN
  Administered 2014-04-12: 0.5 mg via INTRAVENOUS
  Administered 2014-04-12 (×2): .25 mg via INTRAVENOUS
  Administered 2014-04-12: 1 mg via INTRAVENOUS

## 2014-04-12 MED ORDER — FENTANYL CITRATE 0.05 MG/ML IJ SOLN
INTRAMUSCULAR | Status: AC
  Filled 2014-04-12: qty 5

## 2014-04-12 MED ORDER — SODIUM CHLORIDE 0.9 % IJ SOLN
INTRAMUSCULAR | Status: AC
  Filled 2014-04-12: qty 10

## 2014-04-12 MED ORDER — THROMBIN 5000 UNITS EX SOLR
CUTANEOUS | Status: AC
  Filled 2014-04-12: qty 5000

## 2014-04-12 MED ORDER — HYDROCODONE-ACETAMINOPHEN 5-325 MG PO TABS: 1.0000 | ORAL_TABLET | ORAL | Status: DC | PRN

## 2014-04-12 MED ORDER — ALPRAZOLAM 1 MG PO TABS
1.0000 mg | ORAL_TABLET | Freq: Three times a day (TID) | ORAL | Status: DC
  Administered 2014-04-12 – 2014-04-13 (×2): 1 mg via ORAL
  Filled 2014-04-12 (×2): qty 1

## 2014-04-12 MED ORDER — HYDROMORPHONE HCL PF 2 MG/ML IJ SOLN
INTRAMUSCULAR | Status: AC
Start: 2014-04-12 — End: 2014-04-12
  Filled 2014-04-12: qty 1

## 2014-04-12 MED ORDER — HYDROCODONE-ACETAMINOPHEN 5-325 MG PO TABS
1.0000 | ORAL_TABLET | ORAL | Status: DC | PRN
  Administered 2014-04-12 – 2014-04-13 (×4): 2 via ORAL
  Filled 2014-04-12 (×4): qty 2

## 2014-04-12 MED ORDER — LIDOCAINE HCL (CARDIAC) 20 MG/ML IV SOLN
INTRAVENOUS | Status: AC
  Filled 2014-04-12: qty 5

## 2014-04-12 MED ORDER — EPHEDRINE SULFATE 50 MG/ML IJ SOLN
INTRAMUSCULAR | Status: DC | PRN
  Administered 2014-04-12: 10 mg via INTRAVENOUS
  Administered 2014-04-12 (×4): 5 mg via INTRAVENOUS

## 2014-04-12 MED ORDER — KETAMINE HCL 10 MG/ML IJ SOLN
INTRAMUSCULAR | Status: DC | PRN
  Administered 2014-04-12: 25 mg via INTRAVENOUS

## 2014-04-12 MED ORDER — KETAMINE HCL 10 MG/ML IJ SOLN
INTRAMUSCULAR | Status: AC
  Filled 2014-04-12: qty 1

## 2014-04-12 MED ORDER — SODIUM CHLORIDE 0.9 % IJ SOLN: 3.0000 mL | Freq: Two times a day (BID) | INTRAMUSCULAR | Status: DC

## 2014-04-12 MED ORDER — ONDANSETRON HCL 4 MG/2ML IJ SOLN: 4.0000 mg | INTRAMUSCULAR | Status: DC | PRN

## 2014-04-12 MED ORDER — LACTATED RINGERS IV SOLN
INTRAVENOUS | Status: DC | PRN
  Administered 2014-04-12 (×2): via INTRAVENOUS

## 2014-04-12 MED ORDER — PHENYLEPHRINE HCL 10 MG/ML IJ SOLN
INTRAMUSCULAR | Status: DC | PRN
  Administered 2014-04-12 (×2): 40 ug via INTRAVENOUS

## 2014-04-12 MED ORDER — GELATIN ABSORBABLE MT POWD
OROMUCOSAL | Status: DC | PRN
  Administered 2014-04-12: 09:00:00 via TOPICAL

## 2014-04-12 MED ORDER — CLONAZEPAM 0.5 MG PO TABS
0.5000 mg | ORAL_TABLET | Freq: Three times a day (TID) | ORAL | Status: DC | PRN
  Administered 2014-04-12 – 2014-04-13 (×2): 0.5 mg via ORAL
  Filled 2014-04-12 (×3): qty 1

## 2014-04-12 MED ORDER — PHENYLEPHRINE 40 MCG/ML (10ML) SYRINGE FOR IV PUSH (FOR BLOOD PRESSURE SUPPORT)
PREFILLED_SYRINGE | INTRAVENOUS | Status: AC
  Filled 2014-04-12: qty 10

## 2014-04-12 MED ORDER — SODIUM CHLORIDE 0.45 % IV SOLN
INTRAVENOUS | Status: DC
  Administered 2014-04-12: 1000 mL via INTRAVENOUS

## 2014-04-12 MED ORDER — FENTANYL CITRATE 0.05 MG/ML IJ SOLN
INTRAMUSCULAR | Status: DC | PRN
Start: 2014-04-12 — End: 2014-04-12
  Administered 2014-04-12: 100 ug via INTRAVENOUS
  Administered 2014-04-12 (×3): 50 ug via INTRAVENOUS

## 2014-04-12 MED ORDER — DEXAMETHASONE SODIUM PHOSPHATE 10 MG/ML IJ SOLN
INTRAMUSCULAR | Status: AC
  Filled 2014-04-12: qty 1

## 2014-04-12 MED ORDER — BUPIVACAINE-EPINEPHRINE (PF) 0.5% -1:200000 IJ SOLN
INTRAMUSCULAR | Status: AC
  Filled 2014-04-12: qty 30

## 2014-04-12 MED ORDER — PROPOFOL 10 MG/ML IV BOLUS
INTRAVENOUS | Status: DC | PRN
  Administered 2014-04-12: 150 mg via INTRAVENOUS

## 2014-04-12 SURGICAL SUPPLY — 45 items
BAG SPEC THK2 15X12 ZIP CLS (MISCELLANEOUS)
BAG ZIPLOCK 12X15 (MISCELLANEOUS) IMPLANT
CHLORAPREP W/TINT 26ML (MISCELLANEOUS) IMPLANT
CLEANER TIP ELECTROSURG 2X2 (MISCELLANEOUS) ×2 IMPLANT
CLOTH 2% CHLOROHEXIDINE 3PK (PERSONAL CARE ITEMS) ×2 IMPLANT
DRAPE MICROSCOPE LEICA (MISCELLANEOUS) ×2 IMPLANT
DRAPE POUCH INSTRU U-SHP 10X18 (DRAPES) ×2 IMPLANT
DRAPE SURG 17X11 SM STRL (DRAPES) ×2 IMPLANT
DRAPE UTILITY XL STRL (DRAPES) ×2 IMPLANT
DRSG AQUACEL AG ADV 3.5X 4 (GAUZE/BANDAGES/DRESSINGS) IMPLANT
DRSG AQUACEL AG ADV 3.5X 6 (GAUZE/BANDAGES/DRESSINGS) ×1 IMPLANT
DURAPREP 26ML APPLICATOR (WOUND CARE) ×2 IMPLANT
DURASEAL SPINE SEALANT 3ML (MISCELLANEOUS) IMPLANT
ELECT BLADE TIP CTD 4 INCH (ELECTRODE) ×1 IMPLANT
ELECT REM PT RETURN 9FT ADLT (ELECTROSURGICAL) ×2
ELECTRODE REM PT RTRN 9FT ADLT (ELECTROSURGICAL) ×1 IMPLANT
GLOVE BIOGEL PI IND STRL 7.5 (GLOVE) ×1 IMPLANT
GLOVE BIOGEL PI INDICATOR 7.5 (GLOVE) ×1
GLOVE SURG SS PI 7.5 STRL IVOR (GLOVE) ×2 IMPLANT
GLOVE SURG SS PI 8.0 STRL IVOR (GLOVE) ×4 IMPLANT
GOWN STRL REUS W/TWL XL LVL3 (GOWN DISPOSABLE) ×5 IMPLANT
IV CATH 14GX2 1/4 (CATHETERS) ×2 IMPLANT
KIT BASIN OR (CUSTOM PROCEDURE TRAY) ×2 IMPLANT
KIT POSITIONING SURG ANDREWS (MISCELLANEOUS) ×2 IMPLANT
MANIFOLD NEPTUNE II (INSTRUMENTS) ×2 IMPLANT
NDL SPNL 18GX3.5 QUINCKE PK (NEEDLE) ×2 IMPLANT
NEEDLE SPNL 18GX3.5 QUINCKE PK (NEEDLE) ×4 IMPLANT
PATTIES SURGICAL .5 X.5 (GAUZE/BANDAGES/DRESSINGS) IMPLANT
PATTIES SURGICAL .75X.75 (GAUZE/BANDAGES/DRESSINGS) IMPLANT
PATTIES SURGICAL 1X1 (DISPOSABLE) IMPLANT
SPONGE SURGIFOAM ABS GEL 100 (HEMOSTASIS) ×2 IMPLANT
STAPLER VISISTAT (STAPLE) IMPLANT
SUT PROLENE 3 0 PS 2 (SUTURE) ×2 IMPLANT
SUT VIC AB 1 CT1 27 (SUTURE) ×4
SUT VIC AB 1 CT1 27XBRD ANTBC (SUTURE) IMPLANT
SUT VIC AB 1-0 CT2 27 (SUTURE) ×1 IMPLANT
SUT VIC AB 2-0 CT1 27 (SUTURE) ×2
SUT VIC AB 2-0 CT1 TAPERPNT 27 (SUTURE) IMPLANT
SUT VIC AB 2-0 CT2 27 (SUTURE) ×1 IMPLANT
SYR 3ML LL SCALE MARK (SYRINGE) ×2 IMPLANT
TOWEL OR 17X26 10 PK STRL BLUE (TOWEL DISPOSABLE) ×2 IMPLANT
TOWEL OR NON WOVEN STRL DISP B (DISPOSABLE) IMPLANT
TRAY FOLEY CATH 14FRSI W/METER (CATHETERS) ×1 IMPLANT
TRAY LAMINECTOMY (CUSTOM PROCEDURE TRAY) ×2 IMPLANT
YANKAUER SUCT BULB TIP NO VENT (SUCTIONS) IMPLANT

## 2014-04-12 NOTE — Brief Op Note (Signed)
04/12/2014  9:16 AM  PATIENT:  Dawn Beasley  63 y.o. female  PRE-OPERATIVE DIAGNOSIS:  STENOSIS LUMBAR THREE- LUMBAR FOUR, LUMBAR FOUR- LUMBAR FIVE  POST-OPERATIVE DIAGNOSIS:  STENOSIS LUMBAR THREE- LUMBAR FOUR, LUMBAR FOUR- LUMBAR FIVE  PROCEDURE:  Procedure(s): MICRO LUMBAR DECOMPRESSION LUMBAR THREE TO FOUR,LUMBAR  FOUR TO FIVE (N/A)  SURGEON:  Surgeon(s) and Role:    * Javier Docker, MD - Primary  PHYSICIAN ASSISTANT:   ASSISTANTS: [place]   ANESTHESIA:   general  EBL:  Total I/O In: 1000 [I.V.:1000] Out: 150 [Urine:100; Blood:50]  BLOOD ADMINISTERED:none  DRAINS: none   LOCAL MEDICATIONS USED:  MARCAINE     SPECIMEN:  No Specimen  DISPOSITION OF SPECIMEN:  N/A  COUNTS:  YES  TOURNIQUET:  * No tourniquets in log *  DICTATION: .Other Dictation: Dictation Number [id number]  PLAN OF CARE: Admit for overnight observation  PATIENT DISPOSITION:  PACU - hemodynamically stable.   Delay start of Pharmacological VTE agent (>24hrs) due to surgical blood loss or risk of bleeding: yes

## 2014-04-12 NOTE — H&P (View-Only) (Signed)
Dawn Beasley is an 63 y.o. female.   Chief Complaint: back and leg pain HPI: The patient is a 63 year old female who presents today for follow up of their back. The patient is being followed for their back pain. They are now 2 year(s) out from when symptoms began. Symptoms reported today include: pain (low back) and leg pain (bilateral). Current treatment includes: NSAIDs (Ibuprofen). The following medication has been used for pain control: Norco. The patient reports their current pain level to be moderate to severe. The patient presents today following CT/Myelogram.  Dawn Beasley follows up. She reports persistent pain and request a refill of Norco. She has had a CT myelogram. Two years of symptoms gradually worsening. When she is upright walking, her legs feel heavy into the calves. She has to sit. She has been doing an exercise program and activity modification. No fevers, chills or change in bowel or bladder function. There is no pain which awakens him at night or unexplained recent weight loss.  No past medical history on file.  No past surgical history on file.  No family history on file. Social History:  has no tobacco, alcohol, and drug history on file.  Allergies:  Allergies  Allergen Reactions  . Celebrex [Celecoxib] Anaphylaxis  . Sulfa Antibiotics Swelling     (Not in a hospital admission)  No results found for this or any previous visit (from the past 48 hour(s)). No results found.  Review of Systems  Constitutional: Negative.   HENT: Negative.   Eyes: Negative.   Respiratory: Negative.   Cardiovascular: Negative.   Gastrointestinal: Negative.   Genitourinary: Negative.   Musculoskeletal: Positive for back pain.  Skin: Negative.   Neurological: Positive for focal weakness.  Psychiatric/Behavioral: Negative.     There were no vitals taken for this visit. Physical Exam  Constitutional: She is oriented to person, place, and time. She appears well-developed and  well-nourished.  HENT:  Head: Normocephalic and atraumatic.  Eyes: Conjunctivae and EOM are normal. Pupils are equal, round, and reactive to light.  Neck: Normal range of motion. Neck supple.  Cardiovascular: Normal rate and regular rhythm.   Respiratory: Effort normal and breath sounds normal.  GI: Soft. Bowel sounds are normal.  Musculoskeletal:  She is upright. Mild distress. Mood and affect appropriate. She walks with a slight antalgic gait, forward flexed. Slight quadriceps weakness is noted bilaterally. Pulses are intact. No DVT. No instability of the hips, knees and ankles.   Neurological: She is alert and oriented to person, place, and time. She has normal reflexes.  Skin: Skin is warm and dry.  Psychiatric: She has a normal mood and affect.    I reviewed her myelogram. Again, when she is upright in the extended position there is a near complete block in extension. It is relieved with forward flexion. This is predominantly at L4-5. She does have some significant lateral recess stenosis at L3-4.  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 Moderate to severe at L3-4 requiring narcotic analgesics two years duration, progressively worse.  We discussed options of living with her symptoms ,which she does not want to do,she can not live with her symptoms, forward flexed gait , avoid extension and favor flexion. We discussed intermittent epidural steroid injections and she has declined. Possible surgical option of decompression at L4-5, perhaps extend to L3-4.  I had an extensive discussion of the risks and benefits of the lumbar  decompression with the patient including bleeding, infection, damage to neurovascular structures, epidural fibrosis, CSF leak requiring repair. We also discussed increase in pain, adjacent segment disease, recurrent disc herniation, need for future surgery  including repeat decompression and/or fusion. We also discussed risks of postoperative hematoma, paralysis, anesthetic complications including DVT, PE, death, cardiopulmonary dysfunction. In addition, the perioperative and postoperative courses were discussed in detail including the rehabilitative time and return to functional activity and work. I provided the patient with an illustrated handout and utilized the appropriate surgical models.  She would like to proceed with that. 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 She would likely have residual symptoms related to that. She understands.  Her son is a Proofreadernuclear engineer. I would be happy to discuss her condition with her. I will see her back following that. I will keep her out of work. She is unable to continue to work.  She will require preoperative clearance. No history of MRSA. Again, she declined epidurals, dosepak, etc.  Dawn BarkerJaclyn M. Boubacar Lerette PA-C for Dr. Shelle IronBeane 04/05/2014, 8:39 PM

## 2014-04-12 NOTE — Transfer of Care (Signed)
Immediate Anesthesia Transfer of Care Note  Patient: Dawn Beasley  Procedure(s) Performed: Procedure(s): MICRO LUMBAR DECOMPRESSION LUMBAR THREE TO FOUR,LUMBAR  FOUR TO FIVE (N/A)  Patient Location: PACU  Anesthesia Type:General  Level of Consciousness: awake, oriented and sedated  Airway & Oxygen Therapy: Patient Spontanous Breathing and Patient connected to face mask oxygen  Post-op Assessment: Report given to PACU RN and Post -op Vital signs reviewed and stable  Post vital signs: stable  Complications: No apparent anesthesia complications

## 2014-04-12 NOTE — Anesthesia Postprocedure Evaluation (Signed)
  Anesthesia Post-op Note  Patient: Dawn Beasley  Procedure(s) Performed: Procedure(s) (LRB): MICRO LUMBAR DECOMPRESSION LUMBAR THREE TO FOUR,LUMBAR  FOUR TO FIVE (N/A)  Patient Location: PACU  Anesthesia Type: General  Level of Consciousness: awake and alert   Airway and Oxygen Therapy: Patient Spontanous Breathing  Post-op Pain: mild  Post-op Assessment: Post-op Vital signs reviewed, Patient's Cardiovascular Status Stable, Respiratory Function Stable, Patent Airway and No signs of Nausea or vomiting  Last Vitals:  Filed Vitals:   04/12/14 1401  BP: 113/69  Pulse: 98  Temp: 36.4 C  Resp: 16    Post-op Vital Signs: stable   Complications: No apparent anesthesia complications

## 2014-04-12 NOTE — Discharge Instructions (Signed)
Walk As Tolerated utilizing back precautions.  No bending, twisting, or lifting.  No driving for 2 weeks.   °Aquacel dressing may remain in place for 7 days. May shower with aquacel dressing in place. After 7 days, remove aquacel dressing and place gauze and tape dressing which should be kept clean and dry and changed daily. Do not remove steri-strips if they are present. °See Dr. Felix Meras in office in 10 to 14 days. Begin taking aspirin 81mg per day starting 4 days after your surgery if not allergic to aspirin or on another blood thinner. °Walk daily even outside. Use a cane or walker only if necessary. °Avoid sitting on soft sofas. ° °

## 2014-04-12 NOTE — Interval H&P Note (Signed)
History and Physical Interval Note:  04/12/2014 7:23 AM  Dawn Beasley  has presented today for surgery, with the diagnosis of STENOSIS L3-L4   The various methods of treatment have been discussed with the patient and family. After consideration of risks, benefits and other options for treatment, the patient has consented to  Procedure(s): MICRO LUMBAR DECOMPRESSION L3-L4 L4-L5  (2 LEVEL)  (N/A) as a surgical intervention .  The patient's history has been reviewed, patient examined, no change in status, stable for surgery.  I have reviewed the patient's chart and labs.  Questions were answered to the patient's satisfaction.     Javier Docker

## 2014-04-13 ENCOUNTER — Encounter (HOSPITAL_COMMUNITY): Payer: Self-pay | Admitting: Specialist

## 2014-04-13 NOTE — Progress Notes (Signed)
Subjective: 1 Day Post-Op Procedure(s) (LRB): MICRO LUMBAR DECOMPRESSION LUMBAR THREE TO FOUR,LUMBAR  FOUR TO FIVE (N/A) Patient reports pain as mild.  Doing well this AM. Pain well controlled. Seen in AM rounds by Dr. Shelle Iron. Leg pain improved. Foley still in place.  Objective: Vital signs in last 24 hours: Temp:  [97.6 F (36.4 C)-98.6 F (37 C)] 97.8 F (36.6 C) (05/28 0526) Pulse Rate:  [89-129] 89 (05/28 0526) Resp:  [12-20] 20 (05/28 0526) BP: (106-154)/(62-94) 138/72 mmHg (05/28 0526) SpO2:  [92 %-99 %] 96 % (05/28 0526) Weight:  [73.029 kg (161 lb)] 73.029 kg (161 lb) (05/27 1058)  Intake/Output from previous day: 05/27 0701 - 05/28 0700 In: 2600 [P.O.:600; I.V.:2000] Out: 1500 [Urine:1450; Blood:50] Intake/Output this shift:    No results found for this basename: HGB,  in the last 72 hours No results found for this basename: WBC, RBC, HCT, PLT,  in the last 72 hours No results found for this basename: NA, K, CL, CO2, BUN, CREATININE, GLUCOSE, CALCIUM,  in the last 72 hours No results found for this basename: LABPT, INR,  in the last 72 hours  Neurologically intact ABD soft Neurovascular intact Sensation intact distally Intact pulses distally Dorsiflexion/Plantar flexion intact Incision: dressing C/D/I and no drainage No cellulitis present Compartment soft no calf pain or sign of DVT  Assessment/Plan: 1 Day Post-Op Procedure(s) (LRB): MICRO LUMBAR DECOMPRESSION LUMBAR THREE TO FOUR,LUMBAR  FOUR TO FIVE (N/A) Advance diet Up with therapy D/C IV fluids D/C foley this AM If no void in 4 hrs bladder scan, straight cath prn Discussed D/C instructions, Lspine precautions Plan D/C later today if voiding without difficulty and pain well controlled in PT  Kenyana Husak M. Nichlos Kunzler 04/13/2014, 8:03 AM

## 2014-04-13 NOTE — Discharge Summary (Signed)
Physician Discharge Summary   Patient ID: Dawn Beasley MRN: [id number] DOB/AGE: 12/31/1950 63 y.o.  Admit date: 04/12/2014 Discharge date: 04/13/2014  Primary Diagnosis:   STENOSIS LUMBAR THREE- LUMBAR FOUR, LUMBAR FOUR- LUMBAR FIVE  Admission Diagnoses:  Past Medical History  Diagnosis Date  . Hypertension   . Stenosis, spinal, lumbar   . Arthritis   . Anxiety   . Back pain    Discharge Diagnoses:   Active Problems:   Lumbar spinal stenosis  Procedure:  Procedure(s) (LRB): MICRO LUMBAR DECOMPRESSION LUMBAR THREE TO FOUR,LUMBAR  FOUR TO FIVE (N/A)   Consults: None  HPI:  see H&P    Laboratory Data: Hospital Outpatient Visit on 04/06/2014  Component Date Value Ref Range Status  . Sodium 04/06/2014 139  137 - 147 mEq/L Final  . Potassium 04/06/2014 4.7  3.7 - 5.3 mEq/L Final  . Chloride 04/06/2014 97  96 - 112 mEq/L Final  . CO2 04/06/2014 29  19 - 32 mEq/L Final  . Glucose, Bld 04/06/2014 98  70 - 99 mg/dL Final  . BUN 04/06/2014 12  6 - 23 mg/dL Final  . Creatinine, Ser 04/06/2014 0.78  0.50 - 1.10 mg/dL Final  . Calcium 04/06/2014 9.6  8.4 - 10.5 mg/dL Final  . GFR calc non Af Amer 04/06/2014 88* >90 mL/min Final  . GFR calc Af Amer 04/06/2014 >90  >90 mL/min Final   Comment: (NOTE)                          The eGFR has been calculated using the CKD EPI equation.                          This calculation has not been validated in all clinical situations.                          eGFR's persistently <90 mL/min signify possible Chronic Kidney                          Disease.  . WBC 04/06/2014 6.5  4.0 - 10.5 K/uL Final  . RBC 04/06/2014 4.10  3.87 - 5.11 MIL/uL Final  . Hemoglobin 04/06/2014 14.6  12.0 - 15.0 g/dL Final  . HCT 04/06/2014 42.7  36.0 - 46.0 % Final  . MCV 04/06/2014 104.1* 78.0 - 100.0 fL Final  . MCH 04/06/2014 35.6* 26.0 - 34.0 pg Final  . MCHC 04/06/2014 34.2  30.0 - 36.0 g/dL Final  . RDW 04/06/2014 14.2  11.5 - 15.5 % Final  . Platelets  04/06/2014 271  150 - 400 K/uL Final  . MRSA, PCR 04/06/2014 NEGATIVE  NEGATIVE Final  . Staphylococcus aureus 04/06/2014 NEGATIVE  NEGATIVE Final   Comment:                                 The Xpert SA Assay (FDA                          approved for NASAL specimens                          in patients over 43 years of age),  is one component of                          a comprehensive surveillance                          program.  Test performance has                          been validated by [facility] for patients greater                          than or equal to 39 year old.                          It is not intended                          to diagnose infection nor to                          guide or monitor treatment.   No results found for this basename: HGB,  in the last 72 hours No results found for this basename: WBC, RBC, HCT, PLT,  in the last 72 hours No results found for this basename: NA, K, CL, CO2, BUN, CREATININE, GLUCOSE, CALCIUM,  in the last 72 hours No results found for this basename: LABPT, INR,  in the last 72 hours  X-Rays:Dg Chest 2 View  04/06/2014   CLINICAL DATA:  Preop lumbar decompression.  Smoker.  EXAM: CHEST  2 VIEW  COMPARISON:  07/29/2007  FINDINGS: Heart size and vascularity are normal. Lungs are clear without infiltrate effusion or mass.  IMPRESSION: No active cardiopulmonary disease.   Electronically Signed   By: Franchot Gallo M.D.   On: 04/06/2014 14:10   Ct Lumbar Spine W Contrast  03/16/2014   CLINICAL DATA:  Back pain and bilateral lower extremity pain, spinal stenosis  EXAM: LUMBAR MYELOGRAM  CT LUMBAR SPINE WITH INTRATHECAL CONTRAST  FLUOROSCOPY TIME:  1 min 4 seconds  TECHNIQUE: The procedure, risks (including but not limited to bleeding, infection, organ damage ), benefits, and alternatives were explained to the patient. Questions regarding the procedure were encouraged and answered. The  patient understands and consents to the procedure. An appropriate entry site was determined under fluoroscopy. Operator donned sterile gloves and mask. Skin site was marked, prepped with Betadine, and draped in usual sterile fashion, and infiltrated locally with 1% lidocaine. A 22 gauge spinal needle was advanced into the thecal sac at L4-5 from a right parasagittal approach. Clear colorless CSF returned. 17 ml Omnipaque 180 were administered intrathecally for lumbar myelography, followed by axial CT scanning of the lumbar spine. I personally performed the lumbar puncture and administered the intrathecal contrast. I also personally supervised acquisition of the myelogram images. Coronal and sagittal reconstructions were generated from the axial scan.  COMPARISON:  02/23/2014  FINDINGS: The Five non rib-bearing segments are assigned L1-L5. Normal alignment. Negative for fracture.  T12-L1:  Unremarkable  L1-2: Narrowing of the interspace with early vacuum phenomenon. Small Schmorl's nodes in the inferior endplate of L1. Mild circumferential  disc bulge. Mild narrowing of the AP diameter of the thecal sac. Foramina remain patent.  L2-3: Moderate narrowing of the interspace with early central vacuum phenomenon, and small Schmorl's nodes in the adjacent endplates. Circumferential disc bulge. Mild central canal stenosis and mild bilateral foraminal encroachment.  L3-4: Mild narrowing of the interspace with central vacuum phenomenon. Circumferential disc bulge. Bilateral facet spurring and some thickening of ligamentum flavum contributing to moderately severe central canal stenosis, bilateral foraminal encroachment, and subarticular recess stenosis left greater than right.  L4-5: Moderate narrowing of the interspace. Central vacuum phenomenon. Moderate circumferential disc bulge. Mild bilateral facet degenerative change with some thickening of ligamentum flavum contributing moderate central canal stenosis and bilateral  foraminal stenosis right worse than left. Marland Kitchen  L5-S1: Moderate narrowing of the interspace with anterior and posterior endplate spurring, mild disc bulge. Central canal capacious. Foramina are patent.  No dynamic instability on standing lateral flexion-extension radiographs. Patchy aortoiliac arterial calcified plaque is noted. Remainder visualized paraspinal soft tissues unremarkable.  IMPRESSION: 1. Multilevel spinal stenosis as detailed above, mild at L1-2 and L2-3, moderately severe L3-4, moderate L4-5. 2. Bilateral foraminal encroachment L2-3 and L3-4, and stenosis at L4-5 right worse than left.   Electronically Signed   By: Arne Cleveland M.D.   On: 03/16/2014 14:53   Dg Spine Portable 1 View  04/12/2014   CLINICAL DATA:  Lumbar laminectomy  EXAM: PORTABLE SPINE - 1 VIEW  COMPARISON:  Study obtained earlier in the day  FINDINGS: The metallic probe tips overlie the inferior aspect of the L3 vertebral body from a posterior approach in the superior aspect of the L5 vertebral body from a posterior approach. There is marked disc space narrowing at L2-3, L4-5 and L5-S1. There is moderate disc space narrowing at L1-2 and L3-4. No fracture or spondylolisthesis.  IMPRESSION: Metallic probes tips as described. Multilevel osteoarthritic change.   Electronically Signed   By: Lowella Grip M.D.   On: 04/12/2014 09:11   Dg Spine Portable 1 View  04/12/2014   CLINICAL DATA:  Intraoperative localization.  EXAM: PORTABLE SPINE - 1 VIEW  COMPARISON:  04/12/2014 at 0806 hr  FINDINGS: Soft tissue retractors are now present overlying the dorsal aspect of L3 and L4. Clamp is present on the L2 and L5 spinous processes, superior and inferior to the soft tissue retractors. Numbering used on exam earlier today is preserved.  IMPRESSION: Intraoperative localization from L2 through L5.   Electronically Signed   By: Dereck Ligas M.D.   On: 04/12/2014 08:26   Dg Spine Portable 1 View  04/12/2014   CLINICAL DATA:  Lumbar  laminectomy  EXAM: PORTABLE SPINE - 1 VIEW  COMPARISON:  03/16/2014, 02/23/2014  FINDINGS: Lateral radiograph of the lumbar spine was obtained and reveals needles within the posterior soft tissues between the spinous processes of L2-3 and directly posterior to the L5 spinous process. Multilevel disc space narrowing is noted from L1 to S1.   Electronically Signed   By: Inez Catalina M.D.   On: 04/12/2014 08:17   Dg Myelography Lumbar Inj Lumbosacral  03/16/2014   CLINICAL DATA:  Back pain and bilateral lower extremity pain, spinal stenosis  EXAM: LUMBAR MYELOGRAM  CT LUMBAR SPINE WITH INTRATHECAL CONTRAST  FLUOROSCOPY TIME:  1 min 4 seconds  TECHNIQUE: The procedure, risks (including but not limited to bleeding, infection, organ damage ), benefits, and alternatives were explained to the patient. Questions regarding the procedure were encouraged and answered. The patient understands and consents to the  procedure. An appropriate entry site was determined under fluoroscopy. Operator donned sterile gloves and mask. Skin site was marked, prepped with Betadine, and draped in usual sterile fashion, and infiltrated locally with 1% lidocaine. A 22 gauge spinal needle was advanced into the thecal sac at L4-5 from a right parasagittal approach. Clear colorless CSF returned. 17 ml Omnipaque 180 were administered intrathecally for lumbar myelography, followed by axial CT scanning of the lumbar spine. I personally performed the lumbar puncture and administered the intrathecal contrast. I also personally supervised acquisition of the myelogram images. Coronal and sagittal reconstructions were generated from the axial scan.  COMPARISON:  02/23/2014  FINDINGS: The Five non rib-bearing segments are assigned L1-L5. Normal alignment. Negative for fracture.  T12-L1:  Unremarkable  L1-2: Narrowing of the interspace with early vacuum phenomenon. Small Schmorl's nodes in the inferior endplate of L1. Mild circumferential disc bulge. Mild  narrowing of the AP diameter of the thecal sac. Foramina remain patent.  L2-3: Moderate narrowing of the interspace with early central vacuum phenomenon, and small Schmorl's nodes in the adjacent endplates. Circumferential disc bulge. Mild central canal stenosis and mild bilateral foraminal encroachment.  L3-4: Mild narrowing of the interspace with central vacuum phenomenon. Circumferential disc bulge. Bilateral facet spurring and some thickening of ligamentum flavum contributing to moderately severe central canal stenosis, bilateral foraminal encroachment, and subarticular recess stenosis left greater than right.  L4-5: Moderate narrowing of the interspace. Central vacuum phenomenon. Moderate circumferential disc bulge. Mild bilateral facet degenerative change with some thickening of ligamentum flavum contributing moderate central canal stenosis and bilateral foraminal stenosis right worse than left. Marland Kitchen  L5-S1: Moderate narrowing of the interspace with anterior and posterior endplate spurring, mild disc bulge. Central canal capacious. Foramina are patent.  No dynamic instability on standing lateral flexion-extension radiographs. Patchy aortoiliac arterial calcified plaque is noted. Remainder visualized paraspinal soft tissues unremarkable.  IMPRESSION: 1. Multilevel spinal stenosis as detailed above, mild at L1-2 and L2-3, moderately severe L3-4, moderate L4-5. 2. Bilateral foraminal encroachment L2-3 and L3-4, and stenosis at L4-5 right worse than left.   Electronically Signed   By: Arne Cleveland M.D.   On: 03/16/2014 14:53    EKG: Orders placed in visit on 04/06/14  . EKG 12-LEAD     Hospital Course: Patient was admitted to [facility] and taken to the OR and underwent the above state procedure without complications.  Patient tolerated the procedure well and was later transferred to the recovery room and then to the orthopaedic floor for postoperative care.  They were given PO and IV analgesics  for pain control following their surgery.  They were given 24 hours of postoperative antibiotics.   PT was consulted postop to assist with mobility and transfers.  The patient was allowed to be WBAT with therapy and was taught back precautions. Discharge planning was consulted to help with postop disposition and equipment needs.  Patient had a good night on the evening of surgery and started to get up OOB with therapy on day one. Patient was seen in rounds and was ready to go home on day one.  They were given discharge instructions and dressing directions.  They were instructed on when to follow up in the office with Dr. Tonita Cong.   Diet: Regular diet Activity:WBAT; Lspine precautions Follow-up:in 10-14 days Disposition - Home Discharged Condition: good   Discharge Instructions   Call MD / Call 911    Complete by:  As directed   If you experience chest pain  or shortness of breath, CALL 911 and be transported to the hospital emergency room.  If you develope a fever above 101 F, pus (white drainage) or increased drainage or redness at the wound, or calf pain, call your surgeon's office.     Constipation Prevention    Complete by:  As directed   Drink plenty of fluids.  Prune juice may be helpful.  You may use a stool softener, such as Colace (over the counter) 100 mg twice a day.  Use MiraLax (over the counter) for constipation as needed.     Diet - low sodium heart healthy    Complete by:  As directed      Increase activity slowly as tolerated    Complete by:  As directed             Medication List         ALPRAZolam 1 MG tablet  Commonly known as:  XANAX  Take 1 mg by mouth 3 (three) times daily.     clonazePAM 0.5 MG tablet  Commonly known as:  KLONOPIN  Take 0.5 mg by mouth 3 (three) times daily as needed for anxiety.     docusate sodium 100 MG capsule  Commonly known as:  COLACE  Take 1 capsule (100 mg total) by mouth 2 (two) times daily as needed for mild constipation.      HYDROcodone-acetaminophen 5-325 MG per tablet  Commonly known as:  NORCO/VICODIN  Take 1-2 tablets by mouth every 4 (four) hours as needed.     triamterene-hydrochlorothiazide 37.5-25 MG per tablet  Commonly known as:  MAXZIDE-25  Take 1 tablet by mouth every morning.           Follow-up Information   Follow up with BEANE,JEFFREY C, MD In 2 weeks.   Specialty:  Orthopedic Surgery   Contact information:   [address] [place] [address] [phone]       Signed: Lacie Draft, PA-C Orthopaedic Surgery 04/13/2014, 6:21 PM

## 2014-04-13 NOTE — Evaluation (Signed)
Occupational Therapy Evaluation Patient Details Name: Dawn Beasley MRN: [id number] DOB: 11-Nov-1951 Today's Date: 04/13/2014    History of Present Illness Pt is s/p L3-4, L4-5 decompression   Clinical Impression   This 63 year old female was admitted for the above surgery.  All education was completed and pt/husband verbalize understanding of back precautions with adls.  No further OT is needed at this time.      Follow Up Recommendations  No OT follow up    Equipment Recommendations  3 in 1 bedside comode    Recommendations for Other Services       Precautions / Restrictions Precautions Precautions: Back Restrictions Weight Bearing Restrictions: No      Mobility Bed Mobility Overal bed mobility: Needs Assistance Bed Mobility: Sidelying to Sit   Sidelying to sit: Min assist       General bed mobility comments: cues for technique; used rail  Transfers Overall transfer level: Needs assistance Equipment used: Rolling walker (2 wheeled) Transfers: Sit to/from Stand Sit to Stand: Min assist         General transfer comment: cues for back precautions    Balance                                            ADL Overall ADL's : Needs assistance/impaired     Grooming: Oral care;Supervision/safety;Standing   Upper Body Bathing: Set up;Sitting   Lower Body Bathing: Maximal assistance;Sit to/from stand   Upper Body Dressing : Minimal assistance;Sitting   Lower Body Dressing: Maximal assistance;Sit to/from stand   Toilet Transfer: Minimal assistance;Stand-pivot (chair)   Toileting- Clothing Manipulation and Hygiene: Moderate assistance;Sit to/from stand         General ADL Comments: Pt performed bathing from eob and changed gown.  Husband will assist with adls.  Educated on AE and toilet aide--pt would benefit from toilet aide.  Demonstrated safely sidestepping into tub but she is not ready for this yet.  She will sponge bathe initially.   Reviewed back precaution handout and all questions answered     Vision                     Perception     Praxis      Pertinent Vitals/Pain Back is "ok"  Repositioned; pt was premedicated     Hand Dominance Right   Extremity/Trunk Assessment Upper Extremity Assessment Upper Extremity Assessment: Overall WFL for tasks assessed           Communication Communication Communication: No difficulties   Cognition Arousal/Alertness: Awake/alert Behavior During Therapy: WFL for tasks assessed/performed Overall Cognitive Status: Within Functional Limits for tasks assessed                     General Comments       Exercises       Shoulder Instructions      Home Living Family/patient expects to be discharged to:: Private residence Living Arrangements: Spouse/significant other   Type of Home: House Home Access: Stairs to enter Entergy CorporationEntrance Stairs-Number of Steps: 2 from garage   Home Layout:  (can stay on one level)     Bathroom Shower/Tub: Tub/shower unit Shower/tub characteristics: Engineer, building servicesCurtain Bathroom Toilet: Standard     Home Equipment: None   Additional Comments: may be able to borrow walker from work--she will check to see if they have  RW      Prior Functioning/Environment Level of Independence: Independent             OT Diagnosis:     OT Problem List:     OT Treatment/Interventions:      OT Goals(Current goals can be found in the care plan section) Acute Rehab OT Goals Patient Stated Goal: get back to being independent  OT Frequency:     Barriers to D/C:            Co-evaluation              End of Session Nurse Communication:  (pt needs mesh underwear and pad when catheter out)  Activity Tolerance: Patient tolerated treatment well Patient left: in chair;with call bell/phone within reach   Time: 5732-2025 OT Time Calculation (min): 41 min Charges:  OT General Charges $OT Visit: 1 Procedure OT Evaluation $Initial  OT Evaluation Tier I: 1 Procedure OT Treatments $Self Care/Home Management : 23-37 mins G-Codes: OT G-codes **NOT FOR INPATIENT CLASS** Functional Assessment Tool Used: clinical observation and judgment Functional Limitation: Self care Self Care Current Status (K2706): At least 60 percent but less than 80 percent impaired, limited or restricted Self Care Goal Status (C3762): At least 60 percent but less than 80 percent impaired, limited or restricted Self Care Discharge Status [phone]): At least 60 percent but less than 80 percent impaired, limited or restricted  [facility] 04/13/2014, 8:57 AM   Marica Otter, OTR/L [phone] 04/13/2014

## 2014-04-13 NOTE — Care Management Note (Signed)
    Page 1 of 1   04/13/2014     4:49:58 PM CARE MANAGEMENT NOTE 04/13/2014  Patient:  Dawn Beasley, Dawn Beasley   Account Number:  [id number]  Date Initiated:  04/13/2014  Documentation initiated by:  Lanier Clam  Subjective/Objective Assessment:   63 YO F ADMITTED W/LUMBAR STENOSIS.     Action/Plan:   FROM HOME.   Anticipated DC Date:  04/13/2014   Anticipated DC Plan:  HOME/SELF CARE      DC Planning Services  CM consult      Choice offered to / List presented to:             Status of service:  Completed, signed off Medicare Important Message given?   (If response is "NO", the following Medicare IM given date fields will be blank) Date Medicare IM given:   Date Additional Medicare IM given:    Discharge Disposition:  HOME/SELF CARE  Per UR Regulation:  Reviewed for med. necessity/level of care/duration of stay  If discussed at Long Length of Stay Meetings, dates discussed:    Comments:

## 2014-04-13 NOTE — Progress Notes (Signed)
Physical Therapy Treatment Patient Details Name: Dawn Beasley MRN: [id number] DOB: May 07, 1951 Today's Date: 04/13/2014    History of Present Illness Pt is s/p L3-4, L4-5 decompression    PT Comments    Progressing well, ready for DC.  Follow Up Recommendations  No PT follow up     Equipment Recommendations  Rolling walker with 5" wheels    Recommendations for Other Services       Precautions / Restrictions Precautions Precautions: Back Precaution Booklet Issued: Yes (comment)    Mobility  Bed Mobility   Bed Mobility: Sit to Supine       Sit to supine: Min guard   General bed mobility comments: cues for back precautions,   Transfers Overall transfer level: Needs assistance Equipment used: Rolling walker (2 wheeled);None Transfers: Sit to/from Stand Sit to Stand: Supervision         General transfer comment: cues to use sink from toilet, cues for UE use/position  Ambulation/Gait Ambulation/Gait assistance: Supervision Ambulation Distance (Feet): 200 Feet Assistive device: Rolling walker (2 wheeled)   Gait velocity: slow   General Gait Details: pt ambulated x 10 ft without RW in room withou a problem   Stairs Stairs: Yes Stairs assistance: Min assist Stair Management: No rails Number of Stairs: 2 General stair comments: used wall and HH  Wheelchair Mobility    Modified Rankin (Stroke Patients Only)       Balance                                    Cognition Arousal/Alertness: Awake/alert Behavior During Therapy: WFL for tasks assessed/performed Overall Cognitive Status: Within Functional Limits for tasks assessed                      Exercises      General Comments        Pertinent Vitals/Pain 5 back    Home Living Family/patient expects to be discharged to:: Private residence   Available Help at Discharge: Family Type of Home: House Home Access: Stairs to enter     Home Equipment: None Additional  Comments: may be able to borrow walker from work--she will check to see if they have RW    Prior Function Level of Independence: Needs assistance    ADL's / Homemaking Assistance Needed: spouse did all since january     PT Goals (current goals can now be found in the care plan section) Acute Rehab PT Goals Patient Stated Goal: get back to being independent PT Goal Formulation: With patient/family Time For Goal Achievement: 04/14/14 Potential to Achieve Goals: Good Progress towards PT goals: Progressing toward goals    Frequency  Min 5X/week    PT Plan Current plan remains appropriate    Co-evaluation             End of Session   Activity Tolerance: Patient tolerated treatment well Patient left: with family/visitor present     Time: 1155-1205 PT Time Calculation (min): 10 min  Charges:  $Gait Training: 8-22 mins $Self Care/Home Management: 8-22                    G Codes:  Functional Assessment Tool Used: clinical judement Functional Limitation: Mobility: Walking and moving around Mobility: Walking and Moving Around Current Status ([id number]): At least 20 percent but less than 40 percent impaired, limited or restricted Mobility: Walking and Moving Around  Goal Status [phone]): At least 1 percent but less than 20 percent impaired, limited or restricted Mobility: Walking and Moving Around Discharge Status [phone]): At least 1 percent but less than 20 percent impaired, limited or restricted   Dawn Beasley 04/13/2014, 12:11 PM Blanchard Kelch PT [phone]

## 2014-04-13 NOTE — Evaluation (Signed)
Physical Therapy Evaluation Patient Details Name: Dawn Beasley MRN: [id number] DOB: [birth year] Today's Date: 04/13/2014   History of Present Illness  Pt is s/p L3-4, L4-5 decompression  Clinical Impression  Pt mobilizing well. Plans DC later today . Pt will benefit from PT to address problems.    Follow Up Recommendations No PT follow up    Equipment Recommendations  Rolling walker with 5" wheels    Recommendations for Other Services       Precautions / Restrictions Precautions Precautions: Back Precaution Booklet Issued: Yes (comment) Restrictions Weight Bearing Restrictions: No      Mobility  Bed Mobility Overal bed mobility: Needs Assistance Bed Mobility: Sit to Supine      Sit to supine: Min guard, practiced onto high bed, spouse present to instruct to get a step stool to assist getting onto bed.    General bed mobility comments: cues for back precautions,   Transfers Overall transfer level: Needs assistance Equipment used: Rolling walker (2 wheeled);None Transfers: Sit to/from Stand Sit to Stand: Supervision         General transfer comment: cues to use sink from toilet, cues for UE use/position  Ambulation/Gait Ambulation/Gait assistance: Min guard Ambulation Distance (Feet): 200 Feet Assistive device: Rolling walker (2 wheeled)   Gait velocity: slow   General Gait Details: pt ambulated x 10 ft without RW in room withou a problem  Stairs            Wheelchair Mobility    Modified Rankin (Stroke Patients Only)       Balance                                             Pertinent Vitals/Pain 6-7 back, to get medicated.    Home Living Family/patient expects to be discharged to:: Private residence Living Arrangements: Spouse/significant other Available Help at Discharge: Family Type of Home: House Home Access: Stairs to enter   [employer] Corporation[employer]trance Stairs-Number of Steps: 2 from garage Home Layout:  (can stay on one  level) Home Equipment: None Additional Comments: may be able to borrow walker from work--she will check to see if they have RW    Prior Function Level of Independence: Needs assistance      ADL's / Homemaking Assistance Needed: spouse did all since january        Hand Dominance   Dominant Hand: Right    Extremity/Trunk Assessment   Upper Extremity Assessment: Overall WFL for tasks assessed           Lower Extremity Assessment: Overall WFL for tasks assessed      Cervical / Trunk Assessment: Normal  Communication   Communication: No difficulties  Cognition Arousal/Alertness: Awake/alert Behavior During Therapy: WFL for tasks assessed/performed Overall Cognitive Status: Within Functional Limits for tasks assessed                      General Comments      Exercises        Assessment/Plan    PT Assessment Patient needs continued PT services  PT Diagnosis Acute pain   PT Problem List Decreased mobility;Decreased knowledge of precautions  PT Treatment Interventions DME instruction;Gait training;Stair training;Functional mobility training;Therapeutic activities;Patient/family education   PT Goals (Current goals can be found in the Care Plan section) Acute Rehab PT Goals Patient Stated Goal: get back to being independent PT  Goal Formulation: With patient/family Time For Goal Achievement: 04/14/14 Potential to Achieve Goals: Good    Frequency Min 5X/week   Barriers to discharge        Co-evaluation               End of Session   Activity Tolerance: Patient tolerated treatment well Patient left: in bed;with call bell/phone within reach;with family/visitor present Nurse Communication: Mobility status    Functional Assessment Tool Used: clinical judement Functional Limitation: Mobility: Walking and moving around Mobility: Walking and Moving Around Current Status (R4163): At least 20 percent but less than 40 percent impaired, limited or  restricted Mobility: Walking and Moving Around Goal Status [phone]): At least 1 percent but less than 20 percent impaired, limited or restricted    Time: 0940-1009 PT Time Calculation (min): 29 min   Charges:   PT Evaluation $Initial PT Evaluation Tier I: 1 Procedure PT Treatments $Gait Training: 8-22 mins $Self Care/Home Management: 8-22   PT G Codes:   Functional Assessment Tool Used: clinical judement Functional Limitation: Mobility: Walking and moving around    [employer] 04/13/2014, 11:44 AM Blanchard Kelch PT [phone]

## 2014-04-13 NOTE — Op Note (Signed)
NAMKnute Neu:  Malinowski, [place]                 ACCOUNT NO.:  [id number]  MEDICAL RECORD NO.:  [id number]  LOCATION:  1606                         [facility]  PHYSICIAN:  Jene EveryJeffrey Stacie Knutzen, M.D.    DATE OF BIRTH:  Nov 29, 1950  DATE OF PROCEDURE: DATE OF DISCHARGE:                              OPERATIVE REPORT   PREOPERATIVE DIAGNOSIS:  Spinal stenosis and disk space at L4-5, L3-4.  POSTOPERATIVE DIAGNOSIS:  Spinal stenosis and disk space at L4-5, L3-4.  PROCEDURES PERFORMED: 1. Microlumbar decompression L3-4, L4-5, with bilateral hemilaminotomy     and lateral recess decompression at L3-4, L4-5. 2. Removal central arch of L4 and L3.  Bilateral foraminotomies of L3,     L4, and L5.  ANESTHESIA:  General.  ASSISTANT:  Lanna PocheJacqueline Bissell, PA.  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  She is indicated for lumbar decompression, failing conservative treatment.  Risk and benefits discussed including bleeding, infection, damage to neurovascular structures, DVT, PE, and anesthetic complications, need for fusion in the future.  She understood residual back pain.  The predominant complaint was leg and radicular pain.  We did not feel a concomitant fusion was required.  TECHNIQUE:  With the patient in supine position, after induction of adequate general anesthesia, 2 g Kefzol, placed prone on the [place] frame.  All bony prominences well padded.  Lumbar region was prepped and draped in usual sterile fashion.  Two 18-gauge spinal needle was utilized to localize the L3-4, L4-5 interspace, confirmed with x-ray. Incision was made from the  spinous process of L2-5.  Subcutaneous tissue was dissected.  Electrocautery was utilized to achieve hemostasis.  The dorsal lumbar fascia was identified and divided in line with the skin incision.  Paraspinous muscle elevated from lamina of L4-5 and L3-4. McCullough retractors were placed.  Confirmatory radiographs obtained with Kocher's on the spinous processes L2-3 and L4-5.  Operating microscope was draped and brought into surgical field.  We removed the spinous process of L3 and L4 and partial L5.  First, we used a 2-mm Kerrison centrally beneath the lamina of L4, removing the neural arch of L4.  Severe central stenosis was noted, ligamentum flavum hypertrophy. We then proceeded to essentially remove the ligamentum flavum detached from the cephalad edge of L5.  Then from both sides of the operating room table, we decompressed the lateral recesses to the medial border of the pedicle removing a small portion of the facets bilaterally as well as the ligamentum flavum.  This was decompressed to the medial border of the pedicle bilaterally and then severe stenosis was noted bilaterally right greater than left.  Generous foraminotomies were performed of L5 and L4 bilaterally.  We continued the decompression cephalad and removed ligamentum flavum from the interspace at L3-4 and performed hemilaminotomy bilaterally at L3 and from the caudad edge of L3 decompressing the lateral recesses to the medial border of the  pedicle and performing foraminotomies of L3 bilaterally, both  secondary ligamentum flavum hypertrophy and facet hypertrophy.  Bipolar electrocautery was utilized to achieve hemostasis.  There was an epidural venous plexus noted.  No disk herniation was noted bilaterally. Confirmatory radiograph obtained with a neuro retractor at the foramen of L3 and 4.  There was ample room in the foramen of L5, L4, and L3 bilaterally following the decompression.  There was excellent dimension of  the foramen following decompression.  No instability was noted.  No active bleeding or CSF leakage.  There was some attenuation of the thecal sac centrally.  Good restoration of thecal sac was noted and was copiously irrigated.  Inspection revealed no evidence of CSF leakage or active bleeding.  There was 1 cm excursion of the L3, L4, and L5 roots bilaterally medial to the pedicle without tension.  I then placed thrombin-soaked Gelfoam in the laminotomy defect.  Removed the McCullough retractors and irrigated the paraspinous musculature.  No active bleeding.  We closed the dorsolumbar fascia with #1 Vicryl interrupted figure-of-8 sutures, subcu with 2-0, skin with staples. Wound was dressed sterilely.  Placed supine on the hospital bed, extubated without difficulty, and transported to the recovery room in satisfactory condition.  The patient tolerated the procedure well.  No complications.  Assistant, Lanna Poche, PA was utilized for patient positioning, gentle intermittent neural retraction, and closure.  Blood loss 50 mL.     Jene Every, M.D.     Cordelia Pen  D:  04/12/2014  T:  04/12/2014  Job:  [id number]

## 2015-06-14 ENCOUNTER — Other Ambulatory Visit: Payer: Self-pay | Admitting: Orthopedic Surgery

## 2015-06-14 DIAGNOSIS — M961 Postlaminectomy syndrome, not elsewhere classified: Secondary | ICD-10-CM

## 2015-06-21 ENCOUNTER — Inpatient Hospital Stay
Admission: RE | Admit: 2015-06-21 | Discharge: 2015-06-21 | Disposition: A | Discharge status: Home / Self Care | Payer: Self-pay | Source: Ambulatory Visit | Attending: Orthopedic Surgery | Admitting: Orthopedic Surgery

## 2015-06-21 ENCOUNTER — Other Ambulatory Visit: Payer: Self-pay | Admitting: Orthopedic Surgery

## 2015-06-21 ENCOUNTER — Ambulatory Visit
Admission: RE | Admit: 2015-06-21 | Discharge: 2015-06-21 | Disposition: A | Discharge status: Home / Self Care | Payer: 59 | Source: Ambulatory Visit | Attending: Orthopedic Surgery | Admitting: Orthopedic Surgery

## 2015-06-21 VITALS — BP 148/109 | HR 104 | Temp 98.1°F | Resp 14

## 2015-06-21 DIAGNOSIS — M961 Postlaminectomy syndrome, not elsewhere classified: Secondary | ICD-10-CM

## 2015-06-21 DIAGNOSIS — M48061 Spinal stenosis, lumbar region without neurogenic claudication: Secondary | ICD-10-CM

## 2015-06-21 MED ORDER — IOHEXOL 180 MG/ML  SOLN
4.5000 mL | Freq: Once | INTRAMUSCULAR | Status: AC | PRN
  Administered 2015-06-21: 4.5 mL

## 2015-06-21 MED ORDER — ONDANSETRON HCL 4 MG/2ML IJ SOLN: 4.0000 mg | Freq: Four times a day (QID) | INTRAMUSCULAR | Status: DC | PRN

## 2015-06-21 MED ORDER — CEFAZOLIN SODIUM-DEXTROSE 2-3 GM-% IV SOLR
2.0000 g | Freq: Once | INTRAVENOUS | Status: AC
  Administered 2015-06-21: 2 g via INTRAVENOUS

## 2015-06-21 MED ORDER — SODIUM CHLORIDE 0.9 % IV SOLN
Freq: Once | INTRAVENOUS | Status: AC
  Administered 2015-06-21: 08:00:00 via INTRAVENOUS

## 2015-06-21 MED ORDER — FENTANYL CITRATE (PF) 100 MCG/2ML IJ SOLN
25.0000 ug | INTRAMUSCULAR | Status: DC | PRN
  Administered 2015-06-21 (×2): 50 ug via INTRAVENOUS

## 2015-06-21 MED ORDER — HYDROCODONE-ACETAMINOPHEN 5-325 MG PO TABS
1.0000 | ORAL_TABLET | Freq: Once | ORAL | Status: AC
  Administered 2015-06-21: 1 via ORAL

## 2015-06-21 MED ORDER — MIDAZOLAM HCL 2 MG/2ML IJ SOLN
1.0000 mg | INTRAMUSCULAR | Status: DC | PRN
  Administered 2015-06-21 (×2): 1 mg via INTRAVENOUS

## 2015-06-21 NOTE — Progress Notes (Signed)
This set of vital signs obtained at [facility]

## 2015-06-21 NOTE — Discharge Instructions (Signed)
Discogram Post Procedure Discharge Instructions ° °1. May resume a regular diet and any medications that you routinely take (including pain medications). °2. No driving day of procedure. °3. Upon discharge go home and rest for at least 4 hours.  May use an ice pack as needed to injection sites on back.  Ice to back 30 minutes on and 30 minutes off, all day. °4. May remove bandades later, today. °5. It is not unusual to be sore for several days after this procedure. ° ° ° °Please contact our office at [phone] for the following symptoms: ° °· Fever greater than 100 degrees °· Increased swelling, pain, or redness at injection site. ° ° °Thank you for visiting [facility]. ° ° °

## 2015-11-20 ENCOUNTER — Ambulatory Visit: Payer: Self-pay | Admitting: Physician Assistant

## 2015-11-28 NOTE — Pre-Procedure Instructions (Signed)
    Danae OrleansJudy C Ricardo  11/28/2015      [id number] [place] [place] - [address] [place] [address] [facility] [address] [place] [address] Phone: [phone] Fax: [phone]    Your procedure is scheduled on 12/05/15.  [facility].M[facility]  Call this number if you have problems the morning of surgery:  [phone]   Remember:  Do not eat food or drink liquids after midnight.  Take these medicines the morning of surgery with A SIP OF WATER --xanax,hydrocodone   Do not wear jewelry, make-up or nail polish.  Do not wear lotions, powders, or perfumes.  You may wear deodorant.  Do not shave 48 hours prior to surgery.  Men may shave face and neck.  Do not bring valuables to the hospital.  [facility] is not responsible for any belongings or valuables.  Contacts, dentures or bridgework may not be worn into surgery.  Leave your suitcase in the car.  After surgery it may be brought to your room.  For patients admitted to the hospital, discharge time will be determined by your treatment team.  Patients discharged the day of surgery will not be allowed to drive home.   Name and phone number of your driver:    Special instructions:    Please read over the following fact sheets that you were given. Pain Booklet, Coughing and Deep Breathing, MRSA Information and Surgical Site Infection Prevention

## 2015-11-29 ENCOUNTER — Encounter (HOSPITAL_COMMUNITY)
Admission: RE | Admit: 2015-11-29 | Discharge: 2015-11-29 | Disposition: A | Discharge status: Home / Self Care | Payer: BLUE CROSS/BLUE SHIELD | Source: Ambulatory Visit | Attending: Orthopedic Surgery | Admitting: Orthopedic Surgery

## 2015-11-29 ENCOUNTER — Encounter (HOSPITAL_COMMUNITY): Payer: Self-pay

## 2015-11-29 DIAGNOSIS — R9431 Abnormal electrocardiogram [ECG] [EKG]: Secondary | ICD-10-CM | POA: Diagnosis not present

## 2015-11-29 DIAGNOSIS — Z01812 Encounter for preprocedural laboratory examination: Secondary | ICD-10-CM | POA: Diagnosis not present

## 2015-11-29 DIAGNOSIS — G894 Chronic pain syndrome: Secondary | ICD-10-CM | POA: Insufficient documentation

## 2015-11-29 DIAGNOSIS — Z01818 Encounter for other preprocedural examination: Secondary | ICD-10-CM | POA: Diagnosis present

## 2015-11-29 LAB — SURGICAL PCR SCREEN
MRSA, PCR: NEGATIVE
Staphylococcus aureus: POSITIVE — AB

## 2015-11-29 LAB — BASIC METABOLIC PANEL
Anion gap: 11 (ref 5–15)
BUN: 9 mg/dL (ref 6–20)
CALCIUM: 9.5 mg/dL (ref 8.9–10.3)
CO2: 31 mmol/L (ref 22–32)
Chloride: 98 mmol/L — ABNORMAL LOW (ref 101–111)
Creatinine, Ser: 0.76 mg/dL (ref 0.44–1.00)
GFR calc non Af Amer: >60 mL/min (ref >60)
GLUCOSE: 102 mg/dL — AB (ref 65–99)
Potassium: 4.4 mmol/L (ref 3.5–5.1)
Sodium: 140 mmol/L (ref 135–145)

## 2015-11-29 LAB — CBC
HEMATOCRIT: 45.1 % (ref 36.0–46.0)
Hemoglobin: 15.3 g/dL — ABNORMAL HIGH (ref 12.0–15.0)
MCH: 36.6 pg — AB (ref 26.0–34.0)
MCHC: 33.9 g/dL (ref 30.0–36.0)
MCV: 107.9 fL — AB (ref 78.0–100.0)
Platelets: 270 10*3/uL (ref 150–400)
RBC: 4.18 MIL/uL (ref 3.87–5.11)
RDW: 13.3 % (ref 11.5–15.5)
WBC: 6.7 10*3/uL (ref 4.0–10.5)

## 2015-12-06 ENCOUNTER — Ambulatory Visit (HOSPITAL_COMMUNITY): Payer: BLUE CROSS/BLUE SHIELD | Admitting: Anesthesiology

## 2015-12-06 ENCOUNTER — Encounter (HOSPITAL_COMMUNITY)
Admission: RE | Disposition: A | Discharge status: Home / Self Care | Payer: Self-pay | Source: Ambulatory Visit | Attending: Orthopedic Surgery

## 2015-12-06 ENCOUNTER — Encounter (HOSPITAL_COMMUNITY): Payer: Self-pay | Admitting: Anesthesiology

## 2015-12-06 ENCOUNTER — Ambulatory Visit (HOSPITAL_COMMUNITY): Payer: BLUE CROSS/BLUE SHIELD

## 2015-12-06 ENCOUNTER — Observation Stay (HOSPITAL_COMMUNITY)
Admission: RE | Admit: 2015-12-06 | Discharge: 2015-12-07 | Disposition: A | Discharge status: Home / Self Care | Payer: BLUE CROSS/BLUE SHIELD | Source: Ambulatory Visit | Attending: Orthopedic Surgery | Admitting: Orthopedic Surgery

## 2015-12-06 DIAGNOSIS — F17210 Nicotine dependence, cigarettes, uncomplicated: Secondary | ICD-10-CM | POA: Diagnosis not present

## 2015-12-06 DIAGNOSIS — G8929 Other chronic pain: Secondary | ICD-10-CM | POA: Diagnosis present

## 2015-12-06 DIAGNOSIS — R531 Weakness: Secondary | ICD-10-CM | POA: Insufficient documentation

## 2015-12-06 DIAGNOSIS — Y838 Other surgical procedures as the cause of abnormal reaction of the patient, or of later complication, without mention of misadventure at the time of the procedure: Secondary | ICD-10-CM | POA: Insufficient documentation

## 2015-12-06 DIAGNOSIS — G894 Chronic pain syndrome: Principal | ICD-10-CM | POA: Insufficient documentation

## 2015-12-06 DIAGNOSIS — M961 Postlaminectomy syndrome, not elsewhere classified: Secondary | ICD-10-CM | POA: Diagnosis not present

## 2015-12-06 DIAGNOSIS — Z419 Encounter for procedure for purposes other than remedying health state, unspecified: Secondary | ICD-10-CM

## 2015-12-06 HISTORY — PX: SPINAL CORD STIMULATOR INSERTION: SHX5378

## 2015-12-06 SURGERY — INSERTION, SPINAL CORD STIMULATOR, LUMBAR
Anesthesia: General

## 2015-12-06 MED ORDER — HYDROMORPHONE HCL 1 MG/ML IJ SOLN
0.2500 mg | INTRAMUSCULAR | Status: DC | PRN
  Administered 2015-12-06 (×4): 0.5 mg via INTRAVENOUS

## 2015-12-06 MED ORDER — ARTIFICIAL TEARS OP OINT
TOPICAL_OINTMENT | OPHTHALMIC | Status: DC | PRN
  Administered 2015-12-06: 1 via OPHTHALMIC

## 2015-12-06 MED ORDER — MIDAZOLAM HCL 2 MG/2ML IJ SOLN
INTRAMUSCULAR | Status: AC
  Filled 2015-12-06: qty 2

## 2015-12-06 MED ORDER — PHENOL 1.4 % MT LIQD: 1.0000 | OROMUCOSAL | Status: DC | PRN

## 2015-12-06 MED ORDER — OXYCODONE HCL 5 MG PO TABS
ORAL_TABLET | ORAL | Status: AC
  Filled 2015-12-06: qty 2

## 2015-12-06 MED ORDER — ONDANSETRON HCL 4 MG/2ML IJ SOLN
INTRAMUSCULAR | Status: DC | PRN
  Administered 2015-12-06: 4 mg via INTRAVENOUS

## 2015-12-06 MED ORDER — DEXAMETHASONE SODIUM PHOSPHATE 4 MG/ML IJ SOLN
INTRAMUSCULAR | Status: DC | PRN
  Administered 2015-12-06: 4 mg via INTRAVENOUS

## 2015-12-06 MED ORDER — SODIUM CHLORIDE 0.9 % IV SOLN
10.0000 mg | INTRAVENOUS | Status: DC | PRN
  Administered 2015-12-06: 20 ug/min via INTRAVENOUS

## 2015-12-06 MED ORDER — ROCURONIUM BROMIDE 100 MG/10ML IV SOLN
INTRAVENOUS | Status: DC | PRN
  Administered 2015-12-06: 10 mg via INTRAVENOUS
  Administered 2015-12-06: 30 mg via INTRAVENOUS

## 2015-12-06 MED ORDER — FENTANYL CITRATE (PF) 250 MCG/5ML IJ SOLN
INTRAMUSCULAR | Status: AC
Start: 2015-12-06 — End: 2015-12-06
  Filled 2015-12-06: qty 5

## 2015-12-06 MED ORDER — CEFAZOLIN SODIUM-DEXTROSE 2-3 GM-% IV SOLR
2.0000 g | INTRAVENOUS | Status: DC
Start: 2015-12-06 — End: 2015-12-06

## 2015-12-06 MED ORDER — 0.9 % SODIUM CHLORIDE (POUR BTL) OPTIME
TOPICAL | Status: DC | PRN
  Administered 2015-12-06: 1000 mL

## 2015-12-06 MED ORDER — CEFAZOLIN SODIUM 1-5 GM-% IV SOLN
1.0000 g | Freq: Three times a day (TID) | INTRAVENOUS | Status: AC
  Administered 2015-12-06 – 2015-12-07 (×2): 1 g via INTRAVENOUS
  Filled 2015-12-06 (×2): qty 50

## 2015-12-06 MED ORDER — ARTIFICIAL TEARS OP OINT
TOPICAL_OINTMENT | OPHTHALMIC | Status: AC
  Filled 2015-12-06: qty 7

## 2015-12-06 MED ORDER — ALPRAZOLAM 0.5 MG PO TABS
1.0000 mg | ORAL_TABLET | Freq: Two times a day (BID) | ORAL | Status: DC
  Administered 2015-12-06 – 2015-12-07 (×2): 1 mg via ORAL
  Filled 2015-12-06 (×2): qty 2

## 2015-12-06 MED ORDER — MIDAZOLAM HCL 5 MG/5ML IJ SOLN
INTRAMUSCULAR | Status: DC | PRN
  Administered 2015-12-06: 2 mg via INTRAVENOUS

## 2015-12-06 MED ORDER — OXYCODONE-ACETAMINOPHEN 10-325 MG PO TABS: 1.0000 | ORAL_TABLET | ORAL | Status: DC | PRN

## 2015-12-06 MED ORDER — EPHEDRINE SULFATE 50 MG/ML IJ SOLN
INTRAMUSCULAR | Status: AC
  Filled 2015-12-06: qty 1

## 2015-12-06 MED ORDER — ROCURONIUM BROMIDE 50 MG/5ML IV SOLN
INTRAVENOUS | Status: AC
  Filled 2015-12-06: qty 1

## 2015-12-06 MED ORDER — HYDROMORPHONE HCL 1 MG/ML IJ SOLN
INTRAMUSCULAR | Status: AC
  Administered 2015-12-06: 0.5 mg via INTRAVENOUS
  Filled 2015-12-06: qty 1

## 2015-12-06 MED ORDER — VECURONIUM BROMIDE 10 MG IV SOLR
INTRAVENOUS | Status: AC
  Filled 2015-12-06: qty 10

## 2015-12-06 MED ORDER — METHOCARBAMOL 500 MG PO TABS: 500.0000 mg | ORAL_TABLET | Freq: Three times a day (TID) | ORAL | Status: DC | PRN

## 2015-12-06 MED ORDER — SUGAMMADEX SODIUM 200 MG/2ML IV SOLN
INTRAVENOUS | Status: DC | PRN
  Administered 2015-12-06: 200 mg via INTRAVENOUS

## 2015-12-06 MED ORDER — ONDANSETRON HCL 4 MG/2ML IJ SOLN
INTRAMUSCULAR | Status: AC
  Filled 2015-12-06: qty 2

## 2015-12-06 MED ORDER — SUCCINYLCHOLINE CHLORIDE 20 MG/ML IJ SOLN
INTRAMUSCULAR | Status: DC | PRN
  Administered 2015-12-06: 100 mg via INTRAVENOUS

## 2015-12-06 MED ORDER — ONDANSETRON HCL 4 MG/2ML IJ SOLN
4.0000 mg | INTRAMUSCULAR | Status: DC | PRN
  Administered 2015-12-06: 4 mg via INTRAVENOUS
  Filled 2015-12-06: qty 2

## 2015-12-06 MED ORDER — LIDOCAINE HCL (CARDIAC) 20 MG/ML IV SOLN
INTRAVENOUS | Status: DC | PRN
  Administered 2015-12-06: 60 mg via INTRAVENOUS

## 2015-12-06 MED ORDER — SUCCINYLCHOLINE CHLORIDE 20 MG/ML IJ SOLN
INTRAMUSCULAR | Status: AC
  Filled 2015-12-06: qty 1

## 2015-12-06 MED ORDER — SODIUM CHLORIDE 0.9 % IJ SOLN: 3.0000 mL | INTRAMUSCULAR | Status: DC | PRN

## 2015-12-06 MED ORDER — LACTATED RINGERS IV SOLN
INTRAVENOUS | Status: DC
  Administered 2015-12-06: 09:00:00 via INTRAVENOUS

## 2015-12-06 MED ORDER — BUPIVACAINE-EPINEPHRINE (PF) 0.25% -1:200000 IJ SOLN
INTRAMUSCULAR | Status: AC
  Filled 2015-12-06: qty 30

## 2015-12-06 MED ORDER — HYDROMORPHONE HCL 1 MG/ML IJ SOLN
0.5000 mg | INTRAMUSCULAR | Status: DC | PRN
  Administered 2015-12-06: 1 mg via INTRAVENOUS
  Filled 2015-12-06: qty 1

## 2015-12-06 MED ORDER — LIDOCAINE HCL (CARDIAC) 20 MG/ML IV SOLN
INTRAVENOUS | Status: AC
  Filled 2015-12-06: qty 10

## 2015-12-06 MED ORDER — DEXAMETHASONE 4 MG PO TABS
4.0000 mg | ORAL_TABLET | Freq: Four times a day (QID) | ORAL | Status: AC
  Administered 2015-12-06 – 2015-12-07 (×3): 4 mg via ORAL
  Filled 2015-12-06 (×3): qty 1

## 2015-12-06 MED ORDER — PROPOFOL 10 MG/ML IV BOLUS
INTRAVENOUS | Status: DC | PRN
  Administered 2015-12-06: 120 mg via INTRAVENOUS

## 2015-12-06 MED ORDER — DEXTROSE 5 % IV SOLN
500.0000 mg | Freq: Four times a day (QID) | INTRAVENOUS | Status: DC | PRN
  Filled 2015-12-06: qty 5

## 2015-12-06 MED ORDER — PROPOFOL 10 MG/ML IV BOLUS
INTRAVENOUS | Status: AC
  Filled 2015-12-06: qty 20

## 2015-12-06 MED ORDER — TRIAMTERENE-HCTZ 37.5-25 MG PO TABS
1.0000 | ORAL_TABLET | Freq: Every morning | ORAL | Status: DC
  Administered 2015-12-07: 1 via ORAL
  Filled 2015-12-06: qty 1

## 2015-12-06 MED ORDER — SODIUM CHLORIDE 0.9 % IJ SOLN: 3.0000 mL | Freq: Two times a day (BID) | INTRAMUSCULAR | Status: DC

## 2015-12-06 MED ORDER — SODIUM CHLORIDE 0.9 % IV SOLN
INTRAVENOUS | Status: DC | PRN
  Administered 2015-12-06: 12:00:00 via INTRAVENOUS

## 2015-12-06 MED ORDER — LACTATED RINGERS IV SOLN: INTRAVENOUS | Status: DC

## 2015-12-06 MED ORDER — PHENYLEPHRINE HCL 10 MG/ML IJ SOLN
INTRAMUSCULAR | Status: DC | PRN
  Administered 2015-12-06 (×3): 80 ug via INTRAVENOUS
  Administered 2015-12-06: 160 ug via INTRAVENOUS

## 2015-12-06 MED ORDER — BUPIVACAINE-EPINEPHRINE 0.25% -1:200000 IJ SOLN
INTRAMUSCULAR | Status: DC | PRN
  Administered 2015-12-06: 10 mL

## 2015-12-06 MED ORDER — OXYCODONE HCL 5 MG PO TABS
10.0000 mg | ORAL_TABLET | ORAL | Status: DC | PRN
  Administered 2015-12-06 – 2015-12-07 (×4): 10 mg via ORAL
  Filled 2015-12-06 (×3): qty 2

## 2015-12-06 MED ORDER — CEFAZOLIN SODIUM-DEXTROSE 2-3 GM-% IV SOLR
INTRAVENOUS | Status: AC
  Administered 2015-12-06: 2 g via INTRAVENOUS
  Filled 2015-12-06: qty 50

## 2015-12-06 MED ORDER — SUGAMMADEX SODIUM 200 MG/2ML IV SOLN
INTRAVENOUS | Status: AC
  Filled 2015-12-06: qty 2

## 2015-12-06 MED ORDER — DEXAMETHASONE SODIUM PHOSPHATE 4 MG/ML IJ SOLN
INTRAMUSCULAR | Status: AC
  Filled 2015-12-06: qty 1

## 2015-12-06 MED ORDER — ONDANSETRON HCL 4 MG PO TABS: 4.0000 mg | ORAL_TABLET | Freq: Three times a day (TID) | ORAL | Status: DC | PRN

## 2015-12-06 MED ORDER — FENTANYL CITRATE (PF) 100 MCG/2ML IJ SOLN
INTRAMUSCULAR | Status: DC | PRN
  Administered 2015-12-06 (×3): 50 ug via INTRAVENOUS
  Administered 2015-12-06: 100 ug via INTRAVENOUS

## 2015-12-06 MED ORDER — THROMBIN 20000 UNITS EX SOLR
CUTANEOUS | Status: DC | PRN
  Administered 2015-12-06: 20000 [IU] via TOPICAL

## 2015-12-06 MED ORDER — DEXAMETHASONE SODIUM PHOSPHATE 4 MG/ML IJ SOLN: 4.0000 mg | Freq: Four times a day (QID) | INTRAMUSCULAR | Status: AC

## 2015-12-06 MED ORDER — MENTHOL 3 MG MT LOZG: 1.0000 | LOZENGE | OROMUCOSAL | Status: DC | PRN

## 2015-12-06 MED ORDER — METHOCARBAMOL 500 MG PO TABS
500.0000 mg | ORAL_TABLET | Freq: Four times a day (QID) | ORAL | Status: DC | PRN
  Administered 2015-12-06 – 2015-12-07 (×3): 500 mg via ORAL
  Filled 2015-12-06 (×3): qty 1

## 2015-12-06 MED ORDER — EPHEDRINE SULFATE 50 MG/ML IJ SOLN
INTRAMUSCULAR | Status: DC | PRN
  Administered 2015-12-06: 5 mg via INTRAVENOUS

## 2015-12-06 MED ORDER — THROMBIN 20000 UNITS EX SOLR
CUTANEOUS | Status: AC
  Filled 2015-12-06: qty 20000

## 2015-12-06 MED ORDER — LACTATED RINGERS IV SOLN
INTRAVENOUS | Status: DC | PRN
  Administered 2015-12-06: 12:00:00 via INTRAVENOUS

## 2015-12-06 SURGICAL SUPPLY — 56 items
CANISTER SUCTION 2500CC (MISCELLANEOUS) ×3 IMPLANT
CLOSURE STERI-STRIP 1/2X4 (GAUZE/BANDAGES/DRESSINGS) ×2
CLSR STERI-STRIP ANTIMIC 1/2X4 (GAUZE/BANDAGES/DRESSINGS) ×3 IMPLANT
COVER PROBE W GEL 5X96 (DRAPES) ×2 IMPLANT
COVER SURGICAL LIGHT HANDLE (MISCELLANEOUS) ×3 IMPLANT
DRAPE C-ARM 42X72 X-RAY (DRAPES) ×3 IMPLANT
DRAPE C-ARMOR (DRAPES) ×3 IMPLANT
DRAPE INCISE IOBAN 85X60 (DRAPES) ×3 IMPLANT
DRAPE SURG 17X23 STRL (DRAPES) ×3 IMPLANT
DRAPE U-SHAPE 47X51 STRL (DRAPES) ×3 IMPLANT
DRSG MEPILEX BORDER 4X4 (GAUZE/BANDAGES/DRESSINGS) ×3 IMPLANT
DRSG MEPILEX BORDER 4X8 (GAUZE/BANDAGES/DRESSINGS) ×3 IMPLANT
DURAPREP 26ML APPLICATOR (WOUND CARE) ×3 IMPLANT
ELECT BLADE 4.0 EZ CLEAN MEGAD (MISCELLANEOUS) ×3
ELECT CAUTERY BLADE 6.4 (BLADE) ×3 IMPLANT
ELECT PENCIL ROCKER SW 15FT (MISCELLANEOUS) ×1 IMPLANT
ELECT REM PT RETURN 9FT ADLT (ELECTROSURGICAL) ×3
ELECTRODE BLDE 4.0 EZ CLN MEGD (MISCELLANEOUS) ×1 IMPLANT
ELECTRODE REM PT RTRN 9FT ADLT (ELECTROSURGICAL) ×1 IMPLANT
GENERATOR PULSE PROCLAIM 5ELIT (Neuro Prosthesis/Implant) IMPLANT
GLOVE BIOGEL PI IND STRL 8.5 (GLOVE) ×1 IMPLANT
GLOVE BIOGEL PI INDICATOR 8.5 (GLOVE) ×4
GLOVE SS N UNI LF 8.5 STRL (GLOVE) ×6 IMPLANT
GOWN STRL REUS W/TWL 2XL LVL3 (GOWN DISPOSABLE) ×3 IMPLANT
KIT BASIN OR (CUSTOM PROCEDURE TRAY) ×3 IMPLANT
KIT ROOM TURNOVER OR (KITS) ×3 IMPLANT
LAMI NARROW PRIPOLE 16CH (Orthopedic Implant) ×2 IMPLANT
NDL SPNL 18GX3.5 QUINCKE PK (NEEDLE) ×3 IMPLANT
NDL SUT 6 .5 CRC .975X.05 MAYO (NEEDLE) ×1 IMPLANT
NEEDLE 22X1 1/2 (OR ONLY) (NEEDLE) ×3 IMPLANT
NEEDLE MAYO TAPER (NEEDLE) ×3
NEEDLE SPNL 18GX3.5 QUINCKE PK (NEEDLE) ×6 IMPLANT
NS IRRIG 1000ML POUR BTL (IV SOLUTION) ×3 IMPLANT
PACK LAMINECTOMY ORTHO (CUSTOM PROCEDURE TRAY) ×3 IMPLANT
PACK UNIVERSAL I (CUSTOM PROCEDURE TRAY) ×3 IMPLANT
PAD ARMBOARD 7.5X6 YLW CONV (MISCELLANEOUS) ×6 IMPLANT
PULSE GENERATOR PROCLAIM 5ELIT (Neuro Prosthesis/Implant) ×3 IMPLANT
SPONGE LAP 4X18 X RAY DECT (DISPOSABLE) IMPLANT
SPONGE SURGIFOAM ABS GEL 100 (HEMOSTASIS) ×3 IMPLANT
STAPLER VISISTAT 35W (STAPLE) ×3 IMPLANT
SURGIFLO W/THROMBIN 8M KIT (HEMOSTASIS) ×3 IMPLANT
SUT BONE WAX W31G (SUTURE) ×3 IMPLANT
SUT FIBERWIRE #2 38 REV NDL BL (SUTURE) ×3
SUT MNCRL AB 3-0 PS2 18 (SUTURE) ×4 IMPLANT
SUT VIC AB 1 CT1 18XCR BRD 8 (SUTURE) ×1 IMPLANT
SUT VIC AB 1 CT1 27 (SUTURE)
SUT VIC AB 1 CT1 27XBRD ANBCTR (SUTURE) ×1 IMPLANT
SUT VIC AB 1 CT1 8-18 (SUTURE) ×9
SUT VIC AB 2-0 CT1 18 (SUTURE) ×5 IMPLANT
SUTURE FIBERWR#2 38 REV NDL BL (SUTURE) ×1 IMPLANT
SYR BULB IRRIGATION 50ML (SYRINGE) ×3 IMPLANT
SYR CONTROL 10ML LL (SYRINGE) ×3 IMPLANT
TOWEL OR 17X24 6PK STRL BLUE (TOWEL DISPOSABLE) ×3 IMPLANT
TOWEL OR 17X26 10 PK STRL BLUE (TOWEL DISPOSABLE) ×3 IMPLANT
TRAY FOLEY CATH 16FRSI W/METER (SET/KITS/TRAYS/PACK) IMPLANT
WATER STERILE IRR 1000ML POUR (IV SOLUTION) ×3 IMPLANT

## 2015-12-06 NOTE — Anesthesia Preprocedure Evaluation (Addendum)
Anesthesia Evaluation  Patient identified by MRN, date of birth, ID band Patient awake    Reviewed: Allergy & Precautions, H&P , NPO status , Patient's Chart, lab work & pertinent test results  Airway Mallampati: III  TM Distance: >3 FB Neck ROM: Full  Mouth opening: Limited Mouth Opening  Dental no notable dental hx. (+) Teeth Intact, Dental Advisory Given   Pulmonary Current Smoker,    Pulmonary exam normal breath sounds clear to auscultation       Cardiovascular hypertension, Pt. on medications  Rhythm:Regular Rate:Normal     Neuro/Psych Anxiety negative neurological ROS     GI/Hepatic negative GI ROS, Neg liver ROS,   Endo/Other  negative endocrine ROS  Renal/GU negative Renal ROS  negative genitourinary   Musculoskeletal  (+) Arthritis , Osteoarthritis,    Abdominal   Peds  Hematology negative hematology ROS (+)   Anesthesia Other Findings   Reproductive/Obstetrics negative OB ROS                            Anesthesia Physical Anesthesia Plan  ASA: III  Anesthesia Plan: General   Post-op Pain Management:    Induction: Intravenous  Airway Management Planned: Oral ETT and Video Laryngoscope Planned  Additional Equipment:   Intra-op Plan:   Post-operative Plan: Extubation in OR  Informed Consent: I have reviewed the patients History and Physical, chart, labs and discussed the procedure including the risks, benefits and alternatives for the proposed anesthesia with the patient or authorized representative who has indicated his/her understanding and acceptance.   Dental advisory given  Plan Discussed with: CRNA  Anesthesia Plan Comments:        Anesthesia Quick Evaluation

## 2015-12-06 NOTE — H&P (Signed)
History of Present Illness The patient is a 65 year old female who presents today for follow up of their back. The patient is being followed for their SCS trial F/U (She has had a Thoracic MRI). Symptoms reported today include: pain (lower back "alot of times it goes more to the left"), aching, throbbing, pain with weightbearing, difficulty ambulating, difficulty arising from chair, pain with overhead motions, weakness (bilateral legs), leg pain ("feel sore and heavy"), pain with lying, pain with lifting, pain with sitting and pain with standing, while the patient does not report symptoms of: numbness, burning, urinary incontinence or foot pain. The patient states that they are doing poorly. Current treatment includes: activity modification and pain medications. The following medication has been used for pain control: Hydrocodone (5/325 Rx'd by Dr Ethelene Hal) and Xanax. The patient reports their current pain level to be 8 / 10 (right now "but I am used to it"). The patient presents today following SCS trial.  Additional reasons for visit:  Transition into care is described as the following: The patient is transitioning into care from another physician (Dr Sheran Luz who did a SCS trial) .   Problem List/Past Medical  Problems Reconciled   Allergies  Sulfa Drugs   Family History  Kidney disease  Mother. Rheumatoid Arthritis  Maternal Grandmother. Cancer  Sister. Heart disease in female family member before age 65  Chronic Obstructive Lung Disease  Mother. Congestive Heart Failure  Maternal Grandmother. First Degree Relatives  reported  Social History  Tobacco use  Current every day smoker. 12/16/2013: smoke(d) 1 pack(s) per day Tobacco / smoke exposure  12/16/2013: yes Marital status  married Not under pain contract  Children  2 Current drinker  12/16/2013: Currently drinks wine less than 5 times per week Exercise  Exercises daily; does running / walking Living situation   live with spouse No history of drug/alcohol rehab  Current work status  working full time, 4 hrs  Medication History Norco (5-325MG  Tablet, 1 (one) Oral three times daily, as needed, Taken starting 11/23/2015) Active. Xanax (  Tablet, 1 (one) Oral two times daily, as needed, Taken starting 11/16/2015) Active. (1 BID) Flector (1.3% Patch, 1 (one) Patch Transdermal every twelve hours, as needed, Taken starting 09/12/2015) Active.  Triamterene-HCTZ (37.5-25MG  Tablet, Oral) Active. (QD) Medications Reconciled  Vitals  11/23/2015 8:37 AM Weight: 157 lb Height: 62in Body Surface Area: 1.72 m Body Mass Index: 28.72 kg/m  Pulse: 103 (Regular)  BP: 147/83 (Sitting, Left Arm, Standard) w/shoes smt  Objective Transcription Her MRI shows no significant canal stenosis, no significant thoracic cord compression.  She has had excellent result with her recent spinal cord stimulator and would like to have a permanent implant. Lungs are clear to auscultation. Heart regular rate and rhythm. No shortness of breath or chest pain.  Abdomen is soft and nontender.  Intact peripheral pulses throughout.  She has sitting intolerance, horrific lumbar spine pain with bilateral radicular leg pain  No focal motor deficits in the lower extremity, well-healed lumbar incision from previous decompression.   Plan: At this point in time, we have gone over the spinal cord stimulator procedure including the risks which are infection, bleeding, nerve damage, death, stroke, paralysis, loss of bowel and bladder control, migration of the lead, ongoing or worse pain. We have gone over the two incision technique that we will be using. All of her questions were encouraged and addressed. We will plan on moving forward with the spinal cord stimulator on January 18. We will  get preoperative medical clearance from her primary care physician some point next week, which will allow [place] to move forward with surgery on  18th.  Risks: Posterior Lumbar/thoracic Decompression/disectomy: Risks of surgery include infection, bleeding, nerve damage, death, stroke, paralysis, failure to heal, need for further surgery, ongoing or worse pain, need for further surgery, CSF leak, loss of bowel or bladder, and migration of the lead which would necessitate need for further surgery. Goal Of Surgery: Discussed that goal of surgery is to reduce pain and improve function and quality of life. Patient is aware that despite all appropriate treatment that there pain and function could be the same, worse, or no different.

## 2015-12-06 NOTE — Anesthesia Postprocedure Evaluation (Signed)
Anesthesia Post Note  Patient: Dawn Beasley  Procedure(s) Performed: Procedure(s) (LRB): LUMBAR SPINAL CORD STIMULATOR INSERTION (N/A)  Patient location during evaluation: PACU Anesthesia Type: General Level of consciousness: awake and alert, oriented and patient cooperative Pain management: pain level controlled Vital Signs Assessment: post-procedure vital signs reviewed and stable Respiratory status: spontaneous breathing, nonlabored ventilation and respiratory function stable Cardiovascular status: blood pressure returned to baseline and stable Postop Assessment: no signs of nausea or vomiting Anesthetic complications: no    Last Vitals:  Filed Vitals:   12/06/15 1600 12/06/15 1624  BP: 117/71 137/71  Pulse: 79 76  Temp: 36.7 C 36.4 C  Resp: 13 16    Last Pain:  Filed Vitals:   12/06/15 1629  PainSc: Asleep                 Fatuma Dowers,E. Anselm Aumiller

## 2015-12-06 NOTE — Anesthesia Procedure Notes (Addendum)
Procedure Name: Intubation Date/Time: 12/06/2015 12:01 PM Performed by: Wray Kearns A Pre-anesthesia Checklist: Patient identified, Timeout performed, Emergency Drugs available, Suction available and Patient being monitored Patient Re-evaluated:Patient Re-evaluated prior to inductionOxygen Delivery Method: Circle system utilized Preoxygenation: Pre-oxygenation with 100% oxygen Intubation Type: IV induction and Cricoid Pressure applied Ventilation: Mask ventilation without difficulty and Oral airway inserted - appropriate to patient size Laryngoscope Size: Mac, 3 and Glidescope Grade View: Grade I Tube type: Oral Tube size: 7.0 mm Number of attempts: 1 Airway Equipment and Method: Rigid stylet and Video-laryngoscopy Placement Confirmation: ETT inserted through vocal cords under direct vision,  breath sounds checked- equal and bilateral and positive ETCO2 Secured at: 21 cm Tube secured with: Tape Dental Injury: Teeth and Oropharynx as per pre-operative assessment  Difficulty Due To: Difficulty was anticipated, Difficult Airway- due to reduced neck mobility, Difficult Airway- due to anterior larynx, Difficult Airway- due to limited oral opening and Difficult Airway- due to dentition Future Recommendations: Recommend- induction with short-acting agent, and alternative techniques readily available

## 2015-12-06 NOTE — Transfer of Care (Signed)
Immediate Anesthesia Transfer of Care Note  Patient: Dawn Beasley  Procedure(s) Performed: Procedure(s): LUMBAR SPINAL CORD STIMULATOR INSERTION (N/A)  Patient Location: PACU  Anesthesia Type:General  Level of Consciousness: awake, oriented, sedated, patient cooperative and responds to stimulation  Airway & Oxygen Therapy: Patient Spontanous Breathing and Patient connected to nasal cannula oxygen  Post-op Assessment: Report given to RN, Post -op Vital signs reviewed and stable, Patient moving all extremities and Patient moving all extremities X 4  Post vital signs: Reviewed and stable  Last Vitals:  Filed Vitals:   12/06/15 0917  BP: 147/82  Pulse: 75  Temp: 36.7 C  Resp: 18    Complications: No apparent anesthesia complications

## 2015-12-06 NOTE — Brief Op Note (Signed)
12/06/2015  2:11 PM  PATIENT:  Dawn Beasley  65 y.o. female  PRE-OPERATIVE DIAGNOSIS:  CHRONIC PAIN SYNDROME  POST-OPERATIVE DIAGNOSIS:  CHRONIC PAIN SYNDROME  PROCEDURE:  Procedure(s): LUMBAR SPINAL CORD STIMULATOR INSERTION (N/A)  SURGEON:  Surgeon(s) and Role:    * Venita Lick, MD - Primary  PHYSICIAN ASSISTANT:   ASSISTANTS: Carmen Mayo   ANESTHESIA:   general  EBL:  Total I/O In: 1500 [I.V.:1500] Out: -   BLOOD ADMINISTERED:none  DRAINS: none   LOCAL MEDICATIONS USED:  MARCAINE     SPECIMEN:  No Specimen  DISPOSITION OF SPECIMEN:  N/A  COUNTS:  YES  TOURNIQUET:  * No tourniquets in log *  DICTATION: .Other Dictation: Dictation Number [id number]  PLAN OF CARE: Admit for overnight observation  PATIENT DISPOSITION:  PACU - hemodynamically stable.

## 2015-12-07 ENCOUNTER — Encounter (HOSPITAL_COMMUNITY): Payer: Self-pay | Admitting: Orthopedic Surgery

## 2015-12-07 DIAGNOSIS — G894 Chronic pain syndrome: Secondary | ICD-10-CM | POA: Diagnosis not present

## 2015-12-07 NOTE — Evaluation (Signed)
Physical Therapy Evaluation Patient Details Name: Dawn Beasley MRN: [id number] DOB: 10/27/1951 Today's Date: 12/07/2015   History of Present Illness  Pt is a 65 y/o female who presents s/p lumbar spinal cord stimulator on 12/06/15.  Clinical Impression  Patient evaluated by Physical Therapy with no further acute PT needs identified. All education has been completed and the patient has no further questions. At the time of PT eval pt was able to perform transfers and ambulation with supervision for safety. One instance of LOB during ambulation due to uneven floor height, however feel this was an isolated incident and that pt is overall steady with use of SPC. See below for any follow-up Physical Therapy or equipment needs. PT is signing off. Thank you for this referral.    Follow Up Recommendations Outpatient PT;Supervision for mobility/OOB    Equipment Recommendations  3in1 (PT) (vs. shower seat. Will defer to OT)    Recommendations for Other Services       Precautions / Restrictions Precautions Precautions: Fall;Back Precaution Booklet Issued: Yes (comment) Precaution Comments: Reviewed precautions handout and during functional mobility.  Restrictions Weight Bearing Restrictions: No      Mobility  Bed Mobility Overal bed mobility: Needs Assistance Bed Mobility: Rolling;Sidelying to Sit Rolling: Supervision Sidelying to sit: Supervision       General bed mobility comments: Supervision for safety and VC's for sequencing and technique. Pt initially scooting to very edge of bed, and required cues to be in the middle of the bed prior to initiating roll for safety. Increased time to achieve full sitting position. HOB flat and rails lowered to simulate home environment.   Transfers Overall transfer level: Needs assistance Equipment used: Straight cane Transfers: Sit to/from Stand Sit to Stand: Modified independent (Device/Increase time)         General transfer comment: No  LOB noted. No assist required.   Ambulation/Gait Ambulation/Gait assistance: Supervision;Min assist Ambulation Distance (Feet): 400 Feet Assistive device: Straight cane Gait Pattern/deviations: Step-through pattern;Decreased stride length;Trunk flexed Gait velocity: Decreased Gait velocity interpretation: Below normal speed for age/gender General Gait Details: 1 instance of LOB with uneven floor. Pt required min assist to recover, but was otherwise at a supervision level for safety.   Stairs Stairs: Yes Stairs assistance: Supervision Stair Management: One rail Left;With cane;Step to pattern;Forwards Number of Stairs: 10 General stair comments: Flight for safety to get to second floor bathroom. VC's for general safety awareness.   Wheelchair Mobility    Modified Rankin (Stroke Patients Only)       Balance Overall balance assessment: Needs assistance Sitting-balance support: Feet supported;No upper extremity supported Sitting balance-Leahy Scale: Fair     Standing balance support: No upper extremity supported Standing balance-Leahy Scale: Fair                               Pertinent Vitals/Pain Pain Assessment: Faces Faces Pain Scale: Hurts little more Pain Location: Incision Pain Descriptors / Indicators: Operative site guarding;Grimacing Pain Intervention(s): Limited activity within patient's tolerance;Monitored during session;Repositioned    Home Living Family/patient expects to be discharged to:: Private residence Living Arrangements: Spouse/significant other Available Help at Discharge: Family;Available 24 hours/day Type of Home: House Home Access: Stairs to enter   [employer] of Steps: 2 from garage Home Layout: Two level;Able to live on main level with bedroom/bathroom Home Equipment: Gilmer Mor - single point Additional Comments: whirlpool tub/shower downstairs with steps to get in. Regular tub shower upstairs  with bigger bathroom that she  prefers    Prior Function Level of Independence: Independent         Comments: Husband provides casual supervision as she gets up the stairs and into the shower for safety.      Hand Dominance   Dominant Hand: Right    Extremity/Trunk Assessment   Upper Extremity Assessment: Defer to OT evaluation           Lower Extremity Assessment: Generalized weakness      Cervical / Trunk Assessment: Normal (s/p surgery)  Communication   Communication: No difficulties (Very talkative)  Cognition Arousal/Alertness: Awake/alert Behavior During Therapy: WFL for tasks assessed/performed Overall Cognitive Status: Within Functional Limits for tasks assessed                      General Comments      Exercises        Assessment/Plan    PT Assessment Patent does not need any further PT services  PT Diagnosis Difficulty walking;Acute pain   PT Problem List    PT Treatment Interventions DME instruction;Gait training;Stair training;Functional mobility training;Therapeutic activities;Therapeutic exercise;Neuromuscular re-education;Patient/family education   PT Goals (Current goals can be found in the Care Plan section) Acute Rehab PT Goals Patient Stated Goal: Shower PT Goal Formulation: With patient Time For Goal Achievement: 12/14/15 Potential to Achieve Goals: Good    Frequency Min 5X/week   Barriers to discharge        Co-evaluation               End of Session   Activity Tolerance: Patient tolerated treatment well Patient left: in chair;with call bell/phone within reach Nurse Communication: Mobility status    Functional Assessment Tool Used: Clinical judgement Functional Limitation: Mobility: Walking and moving around Mobility: Walking and Moving Around Current Status (Z6109): At least 1 percent but less than 20 percent impaired, limited or restricted Mobility: Walking and Moving Around Goal Status [phone]): At least 1 percent but less than 20  percent impaired, limited or restricted Mobility: Walking and Moving Around Discharge Status [phone]): At least 1 percent but less than 20 percent impaired, limited or restricted    Time: 0804-0828 PT Time Calculation (min) (ACUTE ONLY): 24 min   Charges:   PT Evaluation $PT Eval Moderate Complexity: 1 Procedure PT Treatments $Gait Training: 8-22 mins   PT G Codes:   PT G-Codes **NOT FOR INPATIENT CLASS** Functional Assessment Tool Used: Clinical judgement Functional Limitation: Mobility: Walking and moving around Mobility: Walking and Moving Around Current Status (B1478): At least 1 percent but less than 20 percent impaired, limited or restricted Mobility: Walking and Moving Around Goal Status [phone]): At least 1 percent but less than 20 percent impaired, limited or restricted Mobility: Walking and Moving Around Discharge Status [phone]): At least 1 percent but less than 20 percent impaired, limited or restricted    Conni Slipper 12/07/2015, 9:09 AM   Conni Slipper, PT, DPT Acute Rehabilitation Services Pager: [phone]

## 2015-12-07 NOTE — Progress Notes (Signed)
Occupational Therapy Evaluation/Discharge Patient Details Name: MARGURETTE BRENER MRN: [id number] DOB: [birth year] Today's Date: 12/07/2015    History of Present Illness Pt is a 65 y/o female who presents s/p lumbar spinal cord stimulator on 12/06/15.   Clinical Impression   Patient evaluated by Occupational Therapy with no further acute OT needs identified at this time. Reviewed back precautions, compensatory strategies for ADLs, pain management, and fall prevention strategies. Pt completed all functional transfers with min guard-supervision assist and required mod verbal cues to adhere to back precautions. Pt reported that she is unable to wash her own hair due to pain and has it washed 1x/week at a salon - advised pt to avoid arching back in reclining salon chair during these appointments. All education has been completed and pt has no further questions. No OT follow recommended and pt will purchase shower chair on her own. OT signing off. Thank you for this referral.    Follow Up Recommendations  No OT follow up;Supervision - Intermittent    Equipment Recommendations  Tub/shower seat (pt will purchase)    Recommendations for Other Services       Precautions / Restrictions Precautions Precautions: Fall;Back Precaution Booklet Issued: No Precaution Comments: Reviewed back precautions on handout and during ADL tasks Restrictions Weight Bearing Restrictions: No      Mobility Bed Mobility Overal bed mobility: Needs Assistance Bed Mobility: Rolling;Sit to Sidelying Rolling: Min guard Sidelying to sit: Supervision     Sit to sidelying: Min guard General bed mobility comments: Pt attempting to lay down from long sitting position in bed. Verbal cues to complete log roll technique every time getting in/out of bed. Min guard assisto ensure technique completed properly.  Transfers Overall transfer level: Needs assistance Equipment used: None Transfers: Sit to/from Stand Sit to Stand:  Supervision         General transfer comment: Supervision for safety - pt with increased back pain. No physical assist required, no LOB or reports of dizziness    Balance Overall balance assessment: Needs assistance Sitting-balance support: No upper extremity supported;Feet supported Sitting balance-Leahy Scale: Good     Standing balance support: No upper extremity supported;During functional activity Standing balance-Leahy Scale: Fair                              ADL Overall ADL's : Needs assistance/impaired     Grooming: Wash/dry hands;Supervision/safety;Standing Grooming Details (indicate cue type and reason): Educated on 2 cup method for oral care         Upper Body Dressing : Set up;Sitting   Lower Body Dressing: Set up;Sit to/from stand Lower Body Dressing Details (indicate cue type and reason): able to cross ankle-over-knee Toilet Transfer: Supervision/safety;Cueing for safety;Ambulation;Regular Toilet;Grab bars Toilet Transfer Details (indicate cue type and reason): use grab bars to simulate use of sink vanity Toileting- Clothing Manipulation and Hygiene: Supervision/safety;Cueing for compensatory techniques;Cueing for back precautions;Sit to/from stand Toileting - Clothing Manipulation Details (indicate cue type and reason): Educated on availability of AE (toilet aid) for pericare - pt reports being able to squat to manage Tub/ Shower Transfer: Tub transfer;Supervision/safety;Cueing for sequencing;Ambulation;Grab bars   Functional mobility during ADLs: Supervision/safety;Cueing for safety General ADL Comments: Reviewed back precautions, compensatory strategies ADLs (oral care, dressing, pericare, bathing), pain managemet (use of ice), fall prevention, and availability of AE. Pt gets hair washed at salon - reviewed avoiding arching back in reclining salon chair during these appointments. Husband present for eval.  Vision Vision Assessment?: No apparent  visual deficits   Perception     Praxis      Pertinent Vitals/Pain Pain Assessment: 0-10 Pain Score: 6  Faces Pain Scale: Hurts little more Pain Location: back Pain Descriptors / Indicators: Aching;Sore Pain Intervention(s): Limited activity within patient's tolerance;Monitored during session;Repositioned     Hand Dominance Right   Extremity/Trunk Assessment Upper Extremity Assessment Upper Extremity Assessment: Overall WFL for tasks assessed   Lower Extremity Assessment Lower Extremity Assessment: Generalized weakness   Cervical / Trunk Assessment Cervical / Trunk Assessment: Normal (s/p surgery)   Communication Communication Communication: No difficulties   Cognition Arousal/Alertness: Awake/alert Behavior During Therapy: WFL for tasks assessed/performed Overall Cognitive Status: Within Functional Limits for tasks assessed                     General Comments       Exercises       Shoulder Instructions      Home Living Family/patient expects to be discharged to:: Private residence Living Arrangements: Spouse/significant other Available Help at Discharge: Family;Available 24 hours/day (Until Sunday) Type of Home: House Home Access: Stairs to enter [employer] of Steps: 2 from garage   Home Layout: Two level;Able to live on main level with bedroom/bathroom Alternate Level Stairs-Number of Steps: flight Alternate Level Stairs-Rails: Left Bathroom Shower/Tub: Tub/shower unit;Curtain Shower/tub characteristics: Engineer, building services: Standard Bathroom Accessibility: Yes How Accessible: Accessible via walker Home Equipment: Cane - single point;Grab bars - tub/shower   Additional Comments: whirlpool tub/shower downstairs with steps to get in. Regular tub shower upstairs with bigger bathroom that she prefers      Prior Functioning/Environment Level of Independence: Independent        Comments: Pt unable to reach to wash her hair  prior to surgery - has hair washed at salon 1x/week. Husband provides casual supervision as she gets up the stairs and into the shower for safety.     OT Diagnosis: Acute pain   OT Problem List: Decreased strength;Decreased range of motion;Decreased activity tolerance;Impaired balance (sitting and/or standing);Decreased coordination;Decreased safety awareness;Decreased knowledge of use of DME or AE;Decreased knowledge of precautions;Pain   OT Treatment/Interventions:      OT Goals(Current goals can be found in the care plan section) Acute Rehab OT Goals Patient Stated Goal: to go home OT Goal Formulation: With patient Time For Goal Achievement: 12/21/15 Potential to Achieve Goals: Good  OT Frequency:     Barriers to D/C:            Co-evaluation              End of Session Equipment Utilized During Treatment: Gait belt Nurse Communication: Mobility status;Patient requests pain meds;Precautions  Activity Tolerance: Patient tolerated treatment well Patient left: in bed;with call bell/phone within reach;with family/visitor present   Time: 1610-9604 OT Time Calculation (min): 18 min Charges:  OT General Charges $OT Visit: 1 Procedure OT Evaluation $OT Eval Moderate Complexity: 1 Procedure G-Codes: OT G-codes **NOT FOR INPATIENT CLASS** Functional Assessment Tool Used: clinical judgement Functional Limitation: Self care Self Care Current Status (V4098): At least 1 percent but less than 20 percent impaired, limited or restricted Self Care Goal Status (J1914): At least 1 percent but less than 20 percent impaired, limited or restricted Self Care Discharge Status [phone]): At least 1 percent but less than 20 percent impaired, limited or restricted  Nils Pyle, OTR/L Pager: [phone] 12/07/2015, 10:10 AM

## 2015-12-07 NOTE — Op Note (Signed)
Dawn Beasley, Dawn Beasley NO.:  [id number]  MEDICAL RECORD NO.:  [id number]  LOCATION:  3C05C                        FACILITY:  [facility]  PHYSICIAN:  Mar Walmer D. Shon Baton, M.D. DATE OF BIRTH:  11-Oct-1951  DATE OF PROCEDURE:  12/06/2015 DATE OF DISCHARGE:                              OPERATIVE REPORT   PREOPERATIVE DIAGNOSES:  Chronic pain syndrome, post-laminectomy syndrome.  OPERATIVE PROCEDURE:  Spinal cord stimulator implant.  IMPLANT USED:  St. Jude's (Abbott) tripolar lead placed through a T10 laminotomy, secured to the battery in the left lower gluteal region.  INTRAOPERATIVE FINDINGS:  T10 laminotomy performed, but still had difficulty passing the trial lead and so had to perform a small T9 laminotomy as well to remove the epidural fat which was stopping the progression of the trial.  Once that was removed, the implant could easily be passed up.  HISTORY:  This is a very pleasant 65 year old woman who has been having significant debilitating back, buttock, and bilateral leg pain.  She had a trial spinal cord stimulator placement, had excellent response, and had improvement in her pain and quality of life.  As a result, she elected to proceed with permanent implantation.  All appropriate risks, benefits, and alternatives were discussed with the patient and consent was obtained.  FIRST ASSISTANT:  Anette Riedel, [place].  OPERATIVE NOTE:  The patient was brought to the operating room, placed supine on the operating table.  After successful induction of general anesthesia and endotracheal intubation, TEDs and SCDs were applied.  She was turned prone onto the Wilson frame and all bony prominences were well padded.  A time-out was taken confirming patient, procedure, and all other pertinent important data.  Using AP fluoro, I identified the T12 vertebral body based on rib and encountered up to the T10 level.  I then marked out an incision starting at the superior  aspect of the T10 pedicle and proceeding to the inferior aspect of the T11 pedicle. Midline incision was infiltrated with 0.25% Marcaine and then incision was made.  Sharp dissection was carried out down to the deep fascia. Deep fascia was incised and I stripped the paraspinal muscles using Cobb and Bovie to expose the lamina of T10 and T11.  I confirmed the T10 spinous process and T10 pedicle with fluoro.  Once this was done, I removed the bulk of the T10 spinous process.  I then used a 2 and 3 mm Kerrison punch to perform a generous laminotomy of T10.  I then used Penfield 4 to dissect through the central raphe and resect the ligamentum flavum.  The epidural fat was gently dissected and I exposed the dorsal surface of the thecal sac.  I then took the dural spatula and attempted to pass it up underneath the lamina of T10.  I did get just to about the level of the disk space of T9 and T10 before the trialing device could no longer be advanced.  At this point, I attempted to reposition the bed by lowering the kyphosis from the Wilson frame, but this did not help.  At this point, I elected to perform a T9 laminotomy to determine what was preventing the  trial from advancing.  I identified the T9-10 intervertebral space and removed a portion of the T9 spinous process and performed a small laminotomy of T9 using a 2 mm Kerrison punch.  The ligamentum flavum was removed and exposed a small dorsal aspect of the thecal sac.  It was there I noticed two epidural veins along with the fat that was in the midline. I realized that the trialing device was running into this epidural vein and it could not advance.  I isolated the vein and coagulated using bipolar electrocautery.  Once this was done, I could now pass the trialing device superiorly.  I then placed the actual implant.  It did go slightly to the right and I attempted to reposition it.  It was not easily passing and it was causing too much  dorsal compression to the thecal sac, so I elected to leave it where it was most easily placed.  I then sutured it directly to the spinous process of T11 using FiberWire through bone holes.  I then made a second incision in the left lower gluteal region and made a pocket approximately 4 cm deep.  I then used the submuscular Passer and passed the leads from the thoracic wound to the gluteal wound.  I connected them to the battery and torqued the locking nut according to manufacture's standards.  I then placed the battery into the cavity and it was tested by the rep.  It was functioning without difficulty.  I secured the battery to the deep fascia with 2 #1 Vicryl sutures.  Both wounds were copiously irrigated with normal saline, and then I closed each deep fascia with interrupted #1 Vicryl sutures after ensuring I had hemostasis using bipolar electrocautery and Floseal.  I did place a thrombin-soaked Gelfoam patty over the exposed thecal sac.  I then closed superficial with 2-0 Vicryl sutures and a 3-0 Monocryl for the skin.  Steri-Strips and a dry dressing were applied, and the patient was ultimately extubated and transferred to PACU without incident.  At the end of the case, all needle and sponge counts were correct.     Giovan Pinsky D. Shon Baton, M.D.     DDB/MEDQ  D:  12/06/2015  T:  12/06/2015  Job:  [id number]

## 2015-12-07 NOTE — Progress Notes (Signed)
    Subjective: Procedure(s) (LRB): LUMBAR SPINAL CORD STIMULATOR INSERTION (N/A) 1 Day Post-Op  Patient reports pain as 3 on 0-10 scale.  Reports deferred at this time leg pain reports incisional back pain   Positive void Negative bowel movement Positive flatus Negative chest pain or shortness of breath  Objective: Vital signs in last 24 hours: Temp:  [97.5 F (36.4 C)-98.5 F (36.9 C)] 98.4 F (36.9 C) (01/20 0400) Pulse Rate:  [75-93] 92 (01/20 0400) Resp:  [13-19] 18 (01/20 0400) BP: (105-147)/(64-91) 123/64 mmHg (01/20 0400) SpO2:  [93 %-98 %] 94 % (01/20 0400) Weight:  [71.169 kg (156 lb 14.4 oz)] 71.169 kg (156 lb 14.4 oz) (01/19 0917)  Intake/Output from previous day: 01/19 0701 - 01/20 0700 In: 1700 [I.V.:1700] Out: -   Labs: No results for input(s): WBC, RBC, HCT, PLT in the last 72 hours. No results for input(s): NA, K, CL, CO2, BUN, CREATININE, GLUCOSE, CALCIUM in the last 72 hours. No results for input(s): LABPT, INR in the last 72 hours.  Physical Exam: Neurologically intact ABD soft Intact pulses distally Dorsiflexion/Plantar flexion intact Incision: dressing C/D/I and no drainage Compartment soft      Assessment/Plan: Patient stable  xrays N/A Continue mobilization with physical therapy Continue care  Advance diet Up with therapy  Plan on d/c today after SCS is programmed F/U 2 weeks for wound check  Venita Lick, MD [facility] [phone]

## 2015-12-18 NOTE — Discharge Summary (Signed)
Patient ID: Dawn Beasley MRN: [id number] DOB/AGE: 12-03-1950 65 y.o.  Admit date: 12/06/2015 Discharge date: 12/18/2015  Admission Diagnoses:  Active Problems:   Chronic pain   Discharge Diagnoses:  Active Problems:   Chronic pain  status post Procedure(s): LUMBAR SPINAL CORD STIMULATOR INSERTION  Past Medical History  Diagnosis Date  . Hypertension   . Stenosis, spinal, lumbar   . Arthritis   . Anxiety   . Back pain     Surgeries: Procedure(s): LUMBAR SPINAL CORD STIMULATOR INSERTION on 12/06/2015   Consultants:    Discharged Condition: Improved  Hospital Course: Dawn Beasley is an 65 y.o. female who was admitted 12/06/2015 for operative treatment of LBP and radicular leg pain. Patient failed conservative treatments (please see the history and physical for the specifics) and had severe unremitting pain that affects sleep, daily activities and work/hobbies. After pre-op clearance, the patient was taken to the operating room on 12/06/2015 and underwent  Procedure(s): LUMBAR SPINAL CORD STIMULATOR INSERTION.  Pt was discharged on 12/07/15.  Patient was given perioperative antibiotics:  Anti-infectives    Start     Dose/Rate Route Frequency Ordered Stop   12/06/15 2000  ceFAZolin (ANCEF) IVPB 1 g/50 mL premix     1 g 100 mL/hr over 30 Minutes Intravenous Every 8 hours 12/06/15 1638 12/07/15 0355   12/06/15 0930  ceFAZolin (ANCEF) IVPB 2 g/50 mL premix  Status:  Discontinued     2 g 100 mL/hr over 30 Minutes Intravenous To [facility]Stay [facility] 12/06/15 0905 12/06/15 1612   12/06/15 0907  ceFAZolin (ANCEF) 2-3 GM-% IVPB SOLR    Comments:  Dia Crawford   : cabinet override      12/06/15 0907 12/06/15 1235       Patient was given sequential compression devices and early ambulation to prevent DVT.   Patient benefited maximally from hospital stay and there were no complications. At the time of discharge, the patient was urinating/moving their bowels without difficulty,  tolerating a regular diet, pain is controlled with oral pain medications and they have been cleared by PT/OT.   Recent vital signs: No data found.    Recent laboratory studies: No results for input(s): WBC, HGB, HCT, PLT, NA, K, CL, CO2, BUN, CREATININE, GLUCOSE, INR, CALCIUM in the last 72 hours.  Invalid input(s): PT, 2   Discharge Medications:     Medication List    STOP taking these medications        HYDROcodone-acetaminophen 5-325 MG tablet  Commonly known as:  NORCO/VICODIN     ibuprofen 800 MG tablet  Commonly known as:  ADVIL,MOTRIN      TAKE these medications        ALPRAZolam 1 MG tablet  Commonly known as:  XANAX  Take 1 mg by mouth 2 (two) times daily.     methocarbamol 500 MG tablet  Commonly known as:  ROBAXIN  Take 1 tablet (500 mg total) by mouth 3 (three) times daily as needed for muscle spasms.     ondansetron 4 MG tablet  Commonly known as:  ZOFRAN  Take 1 tablet (4 mg total) by mouth every 8 (eight) hours as needed for nausea or vomiting.     oxyCODONE-acetaminophen 10-325 MG tablet  Commonly known as:  PERCOCET  Take 1 tablet by mouth every 4 (four) hours as needed for pain.     triamterene-hydrochlorothiazide 37.5-25 MG tablet  Commonly known as:  MAXZIDE-25  Take 1 tablet by mouth every morning.  Diagnostic Studies: Dg Lumbar Spine 2-3 Views  12/06/2015  CLINICAL DATA:  Spinal cord stimulator insertion. EXAM: LUMBAR SPINE - 2-3 VIEW; DG C-ARM 61-120 MIN COMPARISON:  None. FINDINGS: Two fluoroscopic spot images demonstrate dorsal column stimulator projecting over the posterior portion of the thoracic spinal canal. I believe the leads to likely be at the T8-9, T9, and T9-10 levels. IMPRESSION: 1. Dorsal column stimulator placement, projecting posteriorly over the spinal canal in the lower thoracic spine likely from T8 through T10. Electronically Signed   By: Gaylyn Rong M.D.   On: 12/06/2015 15:09   Dg C-arm 61-120 Min  12/06/2015   CLINICAL DATA:  Spinal cord stimulator insertion. EXAM: LUMBAR SPINE - 2-3 VIEW; DG C-ARM 61-120 MIN COMPARISON:  None. FINDINGS: Two fluoroscopic spot images demonstrate dorsal column stimulator projecting over the posterior portion of the thoracic spinal canal. I believe the leads to likely be at the T8-9, T9, and T9-10 levels. IMPRESSION: 1. Dorsal column stimulator placement, projecting posteriorly over the spinal canal in the lower thoracic spine likely from T8 through T10. Electronically Signed   By: Gaylyn Rong M.D.   On: 12/06/2015 15:09          Follow-up Information    Follow up with Alvy Beal, MD. Schedule an appointment as soon as possible for a visit in 2 weeks.   Specialty:  Orthopedic Surgery   Why:  If symptoms worsen, For suture removal, For wound re-check   Contact information:   [address] [place] [address] [phone]       Discharge Plan:  discharge to   Disposition:     Signed: MayoBaxter Kail for Dr. Venita Lick [facility] [phone] 12/18/2015, 8:34 PM

## 2016-05-12 ENCOUNTER — Other Ambulatory Visit: Payer: Self-pay | Admitting: Orthopedic Surgery

## 2016-05-12 DIAGNOSIS — Z4789 Encounter for other orthopedic aftercare: Secondary | ICD-10-CM

## 2016-05-15 ENCOUNTER — Ambulatory Visit
Admission: RE | Admit: 2016-05-15 | Discharge: 2016-05-15 | Disposition: A | Discharge status: Home / Self Care | Payer: BLUE CROSS/BLUE SHIELD | Source: Ambulatory Visit | Attending: Orthopedic Surgery | Admitting: Orthopedic Surgery

## 2016-05-15 VITALS — BP 142/75 | HR 102

## 2016-05-15 DIAGNOSIS — M48061 Spinal stenosis, lumbar region without neurogenic claudication: Secondary | ICD-10-CM

## 2016-05-15 DIAGNOSIS — Z4789 Encounter for other orthopedic aftercare: Secondary | ICD-10-CM

## 2016-05-15 MED ORDER — IOPAMIDOL (ISOVUE-M 200) INJECTION 41%
15.0000 mL | Freq: Once | INTRAMUSCULAR | Status: AC
  Administered 2016-05-15: 15 mL via INTRATHECAL

## 2016-05-15 MED ORDER — ONDANSETRON HCL 4 MG/2ML IJ SOLN
4.0000 mg | Freq: Once | INTRAMUSCULAR | Status: AC
  Administered 2016-05-15: 4 mg via INTRAMUSCULAR

## 2016-05-15 MED ORDER — DIAZEPAM 5 MG PO TABS
10.0000 mg | ORAL_TABLET | Freq: Once | ORAL | Status: AC
Start: 2016-05-15 — End: 2016-05-15
  Administered 2016-05-15: 10 mg via ORAL

## 2016-05-15 MED ORDER — MEPERIDINE HCL 100 MG/ML IJ SOLN
75.0000 mg | Freq: Once | INTRAMUSCULAR | Status: AC
  Administered 2016-05-15: 75 mg via INTRAMUSCULAR

## 2016-05-15 NOTE — Discharge Instructions (Signed)

## 2016-06-11 ENCOUNTER — Other Ambulatory Visit: Payer: Self-pay | Admitting: Physical Medicine and Rehabilitation

## 2016-06-11 DIAGNOSIS — M961 Postlaminectomy syndrome, not elsewhere classified: Secondary | ICD-10-CM

## 2016-06-16 ENCOUNTER — Ambulatory Visit
Admission: RE | Admit: 2016-06-16 | Discharge: 2016-06-16 | Disposition: A | Discharge status: Home / Self Care | Payer: BLUE CROSS/BLUE SHIELD | Source: Ambulatory Visit | Attending: Physical Medicine and Rehabilitation | Admitting: Physical Medicine and Rehabilitation

## 2016-06-16 ENCOUNTER — Other Ambulatory Visit: Payer: Self-pay | Admitting: Physical Medicine and Rehabilitation

## 2016-06-16 DIAGNOSIS — M961 Postlaminectomy syndrome, not elsewhere classified: Secondary | ICD-10-CM

## 2016-06-16 MED ORDER — IOPAMIDOL (ISOVUE-M 200) INJECTION 41%
1.0000 mL | Freq: Once | INTRAMUSCULAR | Status: AC
  Administered 2016-06-16: 1 mL via EPIDURAL

## 2016-06-16 MED ORDER — METHYLPREDNISOLONE ACETATE 40 MG/ML INJ SUSP (RADIOLOG
120.0000 mg | Freq: Once | INTRAMUSCULAR | Status: AC
  Administered 2016-06-16: 120 mg via EPIDURAL

## 2016-06-16 NOTE — Discharge Instructions (Signed)

## 2016-06-23 ENCOUNTER — Telehealth: Payer: Self-pay | Admitting: Radiology

## 2016-06-23 NOTE — Telephone Encounter (Signed)
Pt called because of symptoms Dr. Ethelene Halamos said were from her steroid injection. C/o right side of face burning with out redness and left eye watering. Also unable to sleep. Explained that symptoms from the steroid were usually gone in 2-3 days max post injection. Also, what she was describing were not things we see with the injection.  Pt states she is taking benadryl 3 x a day per Dr. Ethelene Halamos

## 2016-10-31 DIAGNOSIS — Z9689 Presence of other specified functional implants: Secondary | ICD-10-CM | POA: Insufficient documentation

## 2016-12-23 ENCOUNTER — Inpatient Hospital Stay (HOSPITAL_COMMUNITY)
Admission: EM | Admit: 2016-12-23 | Discharge: 2016-12-25 | DRG: 897 | Disposition: A | Discharge status: Home / Self Care | Payer: BLUE CROSS/BLUE SHIELD | Attending: Family Medicine | Admitting: Family Medicine

## 2016-12-23 ENCOUNTER — Observation Stay (HOSPITAL_COMMUNITY): Payer: BLUE CROSS/BLUE SHIELD

## 2016-12-23 ENCOUNTER — Encounter (HOSPITAL_COMMUNITY): Payer: Self-pay | Admitting: Emergency Medicine

## 2016-12-23 ENCOUNTER — Emergency Department (HOSPITAL_COMMUNITY): Payer: BLUE CROSS/BLUE SHIELD

## 2016-12-23 DIAGNOSIS — G8929 Other chronic pain: Secondary | ICD-10-CM | POA: Diagnosis present

## 2016-12-23 DIAGNOSIS — F1010 Alcohol abuse, uncomplicated: Secondary | ICD-10-CM | POA: Diagnosis present

## 2016-12-23 DIAGNOSIS — F13239 Sedative, hypnotic or anxiolytic dependence with withdrawal, unspecified: Secondary | ICD-10-CM | POA: Diagnosis not present

## 2016-12-23 DIAGNOSIS — F411 Generalized anxiety disorder: Secondary | ICD-10-CM

## 2016-12-23 DIAGNOSIS — R404 Transient alteration of awareness: Secondary | ICD-10-CM | POA: Diagnosis present

## 2016-12-23 DIAGNOSIS — Z888 Allergy status to other drugs, medicaments and biological substances status: Secondary | ICD-10-CM

## 2016-12-23 DIAGNOSIS — E871 Hypo-osmolality and hyponatremia: Secondary | ICD-10-CM | POA: Diagnosis present

## 2016-12-23 DIAGNOSIS — M1990 Unspecified osteoarthritis, unspecified site: Secondary | ICD-10-CM | POA: Diagnosis present

## 2016-12-23 DIAGNOSIS — Z9889 Other specified postprocedural states: Secondary | ICD-10-CM | POA: Diagnosis not present

## 2016-12-23 DIAGNOSIS — I10 Essential (primary) hypertension: Secondary | ICD-10-CM | POA: Diagnosis present

## 2016-12-23 DIAGNOSIS — Z79899 Other long term (current) drug therapy: Secondary | ICD-10-CM

## 2016-12-23 DIAGNOSIS — Z882 Allergy status to sulfonamides status: Secondary | ICD-10-CM

## 2016-12-23 DIAGNOSIS — G9340 Encephalopathy, unspecified: Secondary | ICD-10-CM | POA: Diagnosis present

## 2016-12-23 DIAGNOSIS — F13231 Sedative, hypnotic or anxiolytic dependence with withdrawal delirium: Secondary | ICD-10-CM | POA: Diagnosis present

## 2016-12-23 DIAGNOSIS — R4182 Altered mental status, unspecified: Secondary | ICD-10-CM

## 2016-12-23 DIAGNOSIS — Z79891 Long term (current) use of opiate analgesic: Secondary | ICD-10-CM

## 2016-12-23 DIAGNOSIS — R4789 Other speech disturbances: Secondary | ICD-10-CM

## 2016-12-23 DIAGNOSIS — M961 Postlaminectomy syndrome, not elsewhere classified: Secondary | ICD-10-CM | POA: Diagnosis present

## 2016-12-23 DIAGNOSIS — F1120 Opioid dependence, uncomplicated: Secondary | ICD-10-CM | POA: Diagnosis present

## 2016-12-23 DIAGNOSIS — I639 Cerebral infarction, unspecified: Secondary | ICD-10-CM

## 2016-12-23 DIAGNOSIS — F419 Anxiety disorder, unspecified: Secondary | ICD-10-CM | POA: Diagnosis present

## 2016-12-23 DIAGNOSIS — R4701 Aphasia: Secondary | ICD-10-CM | POA: Diagnosis present

## 2016-12-23 DIAGNOSIS — Z72820 Sleep deprivation: Secondary | ICD-10-CM | POA: Diagnosis not present

## 2016-12-23 DIAGNOSIS — E785 Hyperlipidemia, unspecified: Secondary | ICD-10-CM | POA: Diagnosis present

## 2016-12-23 DIAGNOSIS — Z801 Family history of malignant neoplasm of trachea, bronchus and lung: Secondary | ICD-10-CM | POA: Diagnosis not present

## 2016-12-23 DIAGNOSIS — G894 Chronic pain syndrome: Secondary | ICD-10-CM | POA: Diagnosis present

## 2016-12-23 DIAGNOSIS — R479 Unspecified speech disturbances: Secondary | ICD-10-CM | POA: Diagnosis present

## 2016-12-23 DIAGNOSIS — F1194 Opioid use, unspecified with opioid-induced mood disorder: Secondary | ICD-10-CM | POA: Diagnosis not present

## 2016-12-23 DIAGNOSIS — Z8249 Family history of ischemic heart disease and other diseases of the circulatory system: Secondary | ICD-10-CM

## 2016-12-23 DIAGNOSIS — F17210 Nicotine dependence, cigarettes, uncomplicated: Secondary | ICD-10-CM | POA: Diagnosis present

## 2016-12-23 DIAGNOSIS — F13939 Sedative, hypnotic or anxiolytic use, unspecified with withdrawal, unspecified: Secondary | ICD-10-CM | POA: Diagnosis present

## 2016-12-23 HISTORY — DX: Postlaminectomy syndrome, not elsewhere classified: M96.1

## 2016-12-23 LAB — COMPREHENSIVE METABOLIC PANEL
ALBUMIN: 4.1 g/dL (ref 3.5–5.0)
ALT: 23 U/L (ref 14–54)
AST: 33 U/L (ref 15–41)
Alkaline Phosphatase: 53 U/L (ref 38–126)
Anion gap: 20 — ABNORMAL HIGH (ref 5–15)
BILIRUBIN TOTAL: 1.4 mg/dL — AB (ref 0.3–1.2)
BUN: 12 mg/dL (ref 6–20)
CO2: 26 mmol/L (ref 22–32)
Calcium: 9.6 mg/dL (ref 8.9–10.3)
Chloride: 82 mmol/L — ABNORMAL LOW (ref 101–111)
Creatinine, Ser: 0.86 mg/dL (ref 0.44–1.00)
GFR calc Af Amer: >60 mL/min (ref >60)
GFR calc non Af Amer: >60 mL/min (ref >60)
GLUCOSE: 91 mg/dL (ref 65–99)
POTASSIUM: 3.1 mmol/L — AB (ref 3.5–5.1)
Sodium: 128 mmol/L — ABNORMAL LOW (ref 135–145)
TOTAL PROTEIN: 7.1 g/dL (ref 6.5–8.1)

## 2016-12-23 LAB — BASIC METABOLIC PANEL
Anion gap: 15 (ref 5–15)
BUN: 7 mg/dL (ref 6–20)
CALCIUM: 8.5 mg/dL — AB (ref 8.9–10.3)
CO2: 23 mmol/L (ref 22–32)
CREATININE: 0.76 mg/dL (ref 0.44–1.00)
Chloride: 93 mmol/L — ABNORMAL LOW (ref 101–111)
GFR calc Af Amer: >60 mL/min (ref >60)
GFR calc non Af Amer: >60 mL/min (ref >60)
GLUCOSE: 78 mg/dL (ref 65–99)
POTASSIUM: 2.7 mmol/L — AB (ref 3.5–5.1)
SODIUM: 131 mmol/L — AB (ref 135–145)

## 2016-12-23 LAB — URINALYSIS, ROUTINE W REFLEX MICROSCOPIC
Bacteria, UA: NONE SEEN
Bilirubin Urine: NEGATIVE
Bilirubin Urine: NEGATIVE
Glucose, UA: NEGATIVE mg/dL
Glucose, UA: NEGATIVE mg/dL
Hgb urine dipstick: NEGATIVE
KETONES UR: 80 mg/dL — AB
KETONES UR: 80 mg/dL — AB
Nitrite: NEGATIVE
Nitrite: NEGATIVE
PH: 7 (ref 5.0–8.0)
PROTEIN: 100 mg/dL — AB
PROTEIN: NEGATIVE mg/dL
Specific Gravity, Urine: 1.014 (ref 1.005–1.030)
Specific Gravity, Urine: 1.029 (ref 1.005–1.030)
pH: 6 (ref 5.0–8.0)

## 2016-12-23 LAB — CBC
HEMATOCRIT: 47.6 % — AB (ref 36.0–46.0)
Hemoglobin: 17.1 g/dL — ABNORMAL HIGH (ref 12.0–15.0)
MCH: 36.6 pg — ABNORMAL HIGH (ref 26.0–34.0)
MCHC: 35.9 g/dL (ref 30.0–36.0)
MCV: 101.9 fL — AB (ref 78.0–100.0)
Platelets: 277 10*3/uL (ref 150–400)
RBC: 4.67 MIL/uL (ref 3.87–5.11)
RDW: 13.1 % (ref 11.5–15.5)
WBC: 10.9 10*3/uL — ABNORMAL HIGH (ref 4.0–10.5)

## 2016-12-23 LAB — CBG MONITORING, ED: Glucose-Capillary: 92 mg/dL (ref 65–99)

## 2016-12-23 LAB — AMMONIA: AMMONIA: 23 umol/L (ref 9–35)

## 2016-12-23 LAB — I-STAT VENOUS BLOOD GAS, ED
Acid-Base Excess: 8 mmol/L — ABNORMAL HIGH (ref 0.0–2.0)
Bicarbonate: 32.1 mmol/L — ABNORMAL HIGH (ref 20.0–28.0)
O2 Saturation: 71 %
TCO2: 33 mmol/L (ref 0–100)
pCO2, Ven: 39.7 mmHg — ABNORMAL LOW (ref 44.0–60.0)
pH, Ven: 7.516 — ABNORMAL HIGH (ref 7.250–7.430)
pO2, Ven: 33 mmHg (ref 32.0–45.0)

## 2016-12-23 LAB — ACETAMINOPHEN LEVEL: Acetaminophen (Tylenol), Serum: <10 ug/mL — ABNORMAL LOW (ref 10–30)

## 2016-12-23 LAB — RAPID URINE DRUG SCREEN, HOSP PERFORMED
AMPHETAMINES: NOT DETECTED
Barbiturates: NOT DETECTED
Benzodiazepines: POSITIVE — AB
Cocaine: NOT DETECTED
Opiates: POSITIVE — AB
Tetrahydrocannabinol: NOT DETECTED

## 2016-12-23 LAB — SODIUM, URINE, RANDOM: Sodium, Ur: <10 mmol/L

## 2016-12-23 LAB — I-STAT TROPONIN, ED: Troponin i, poc: 0.02 ng/mL (ref 0.00–0.08)

## 2016-12-23 LAB — OSMOLALITY: OSMOLALITY: 271 mosm/kg — AB (ref 275–295)

## 2016-12-23 LAB — OSMOLALITY, URINE: OSMOLALITY UR: 443 mosm/kg (ref 300–900)

## 2016-12-23 LAB — ETHANOL: Alcohol, Ethyl (B): <5 mg/dL (ref <5)

## 2016-12-23 MED ORDER — BUSPIRONE HCL 15 MG PO TABS
7.5000 mg | ORAL_TABLET | Freq: Two times a day (BID) | ORAL | Status: DC
  Filled 2016-12-23: qty 1

## 2016-12-23 MED ORDER — CLONIDINE HCL 0.2 MG PO TABS: 0.1000 mg | ORAL_TABLET | Freq: Once | ORAL | Status: DC

## 2016-12-23 MED ORDER — PRAVASTATIN SODIUM 40 MG PO TABS: 40.0000 mg | ORAL_TABLET | Freq: Every day | ORAL | Status: DC

## 2016-12-23 MED ORDER — IOPAMIDOL (ISOVUE-370) INJECTION 76%
INTRAVENOUS | Status: AC
  Administered 2016-12-23: 50 mL
  Filled 2016-12-23: qty 50

## 2016-12-23 MED ORDER — SODIUM CHLORIDE 0.9 % IV BOLUS (SEPSIS)
1000.0000 mL | Freq: Once | INTRAVENOUS | Status: AC
  Administered 2016-12-23: 1000 mL via INTRAVENOUS

## 2016-12-23 MED ORDER — LORAZEPAM 2 MG/ML IJ SOLN
INTRAMUSCULAR | Status: AC
  Filled 2016-12-23: qty 1

## 2016-12-23 MED ORDER — DEXTROSE 5 % IV SOLN: 1.0000 g | Freq: Once | INTRAVENOUS | Status: DC

## 2016-12-23 MED ORDER — LORAZEPAM 2 MG/ML IJ SOLN
0.5000 mg | Freq: Four times a day (QID) | INTRAMUSCULAR | Status: DC
  Administered 2016-12-23 (×2): 0.5 mg via INTRAVENOUS
  Filled 2016-12-23: qty 1

## 2016-12-23 MED ORDER — POTASSIUM CHLORIDE CRYS ER 20 MEQ PO TBCR
40.0000 meq | EXTENDED_RELEASE_TABLET | ORAL | Status: AC
Start: 2016-12-23 — End: 2016-12-23
  Administered 2016-12-23 (×2): 40 meq via ORAL
  Filled 2016-12-23 (×2): qty 2

## 2016-12-23 MED ORDER — LORAZEPAM 1 MG PO TABS
1.0000 mg | ORAL_TABLET | Freq: Four times a day (QID) | ORAL | Status: DC | PRN
  Administered 2016-12-24: 1 mg via ORAL
  Filled 2016-12-23: qty 1

## 2016-12-23 MED ORDER — MORPHINE SULFATE (PF) 4 MG/ML IV SOLN
7.0000 mg | INTRAVENOUS | Status: DC
  Administered 2016-12-23 – 2016-12-25 (×6): 7 mg via INTRAVENOUS
  Filled 2016-12-23 (×7): qty 2

## 2016-12-23 MED ORDER — SODIUM CHLORIDE 0.9 % IV SOLN
INTRAVENOUS | Status: DC
  Administered 2016-12-23 – 2016-12-25 (×3): via INTRAVENOUS

## 2016-12-23 MED ORDER — CLONAZEPAM 0.5 MG PO TABS
1.0000 mg | ORAL_TABLET | Freq: Two times a day (BID) | ORAL | Status: DC
Start: 2016-12-23 — End: 2016-12-23

## 2016-12-23 MED ORDER — OXYCODONE HCL 5 MG PO TABS
5.0000 mg | ORAL_TABLET | ORAL | Status: DC | PRN
Start: 2016-12-23 — End: 2016-12-25
  Administered 2016-12-24 – 2016-12-25 (×3): 5 mg via ORAL
  Filled 2016-12-23 (×3): qty 1

## 2016-12-23 MED ORDER — SODIUM CHLORIDE 0.9% FLUSH
3.0000 mL | Freq: Two times a day (BID) | INTRAVENOUS | Status: DC
  Administered 2016-12-23 – 2016-12-24 (×2): 3 mL via INTRAVENOUS

## 2016-12-23 MED ORDER — ADULT MULTIVITAMIN W/MINERALS CH
1.0000 | ORAL_TABLET | Freq: Every day | ORAL | Status: DC
  Administered 2016-12-23 – 2016-12-25 (×3): 1 via ORAL
  Filled 2016-12-23 (×3): qty 1

## 2016-12-23 MED ORDER — OXYCODONE-ACETAMINOPHEN 10-325 MG PO TABS: 1.0000 | ORAL_TABLET | ORAL | Status: DC | PRN

## 2016-12-23 MED ORDER — METOPROLOL TARTRATE 25 MG PO TABS: 25.0000 mg | ORAL_TABLET | Freq: Every day | ORAL | Status: DC

## 2016-12-23 MED ORDER — LORAZEPAM 2 MG/ML IJ SOLN
1.0000 mg | Freq: Four times a day (QID) | INTRAMUSCULAR | Status: DC | PRN
  Filled 2016-12-23: qty 1

## 2016-12-23 MED ORDER — METOPROLOL TARTRATE 5 MG/5ML IV SOLN
5.0000 mg | Freq: Once | INTRAVENOUS | Status: AC
  Administered 2016-12-23: 5 mg via INTRAVENOUS
  Filled 2016-12-23: qty 5

## 2016-12-23 MED ORDER — AMLODIPINE BESYLATE 10 MG PO TABS: 10.0000 mg | ORAL_TABLET | Freq: Every day | ORAL | Status: DC

## 2016-12-23 MED ORDER — GABAPENTIN 300 MG PO CAPS: 300.0000 mg | ORAL_CAPSULE | Freq: Three times a day (TID) | ORAL | Status: DC

## 2016-12-23 MED ORDER — ENOXAPARIN SODIUM 40 MG/0.4ML ~~LOC~~ SOLN
40.0000 mg | SUBCUTANEOUS | Status: DC
  Administered 2016-12-23 – 2016-12-24 (×2): 40 mg via SUBCUTANEOUS
  Filled 2016-12-23 (×2): qty 0.4

## 2016-12-23 MED ORDER — THIAMINE HCL 100 MG/ML IJ SOLN
100.0000 mg | INTRAMUSCULAR | Status: DC
  Administered 2016-12-23: 100 mg via INTRAVENOUS
  Filled 2016-12-23: qty 2

## 2016-12-23 MED ORDER — FOLIC ACID 1 MG PO TABS
1.0000 mg | ORAL_TABLET | Freq: Every day | ORAL | Status: DC
  Administered 2016-12-23 – 2016-12-25 (×3): 1 mg via ORAL
  Filled 2016-12-23 (×3): qty 1

## 2016-12-23 MED ORDER — OXYCODONE-ACETAMINOPHEN 5-325 MG PO TABS
1.0000 | ORAL_TABLET | ORAL | Status: DC | PRN
  Administered 2016-12-24 – 2016-12-25 (×2): 1 via ORAL
  Filled 2016-12-23 (×2): qty 1

## 2016-12-23 NOTE — ED Triage Notes (Signed)
Pt here for AMS; pt speaking rapidly and not sleeping per pt; pt sts she takes opiods for pain

## 2016-12-23 NOTE — ED Notes (Signed)
Patient in EEG.  

## 2016-12-23 NOTE — Progress Notes (Signed)
Dawn OrleansJudy C Gilleland [id number] Admission Data: 12/23/2016 6:48 PM Attending Provider: Leighton Roachodd D McDiarmid, MD  WUJ:[id number]:Nathan Melford Aaseonroy, PA-C Consults/ Treatment Team: Treatment Team:  Kym GroomNeuro1 [facility], MD  Dawn Beasley is a 66 y.o. female patient admitted from ED awake, alert  & orientated  X 3,  Full Code, VSS - Blood pressure (!) 144/96, pulse (!) 114, temperature 98.3 F (36.8 C), temperature source Oral, resp. rate 18, height 5\' 3"  (1.6 m), weight 60.4 kg (133 lb 3.2 oz), SpO2 97 %., O2    2 L nasal cannular, no c/o shortness of breath, no c/o chest pain, no distress noted. Tele # 02 placed and pt is currently running:sinus tachycardia.   IV site WDL:  forearm left, condition patent and no redness with a transparent dsg that's clean dry and intact.  Allergies:   Allergies  Allergen Reactions  . Celebrex [Celecoxib] Swelling    Throat swelling  . Sulfa Antibiotics Swelling    Throat swelling     Past Medical History:  Diagnosis Date  . Anxiety   . Arthritis   . Back pain   . Hypertension   . Stenosis, spinal, lumbar     History:  obtained from chart review. Tobacco/alcohol: denied none  Pt orientation to unit, room and routine. Information packet given to patient/family and safety video watched.  Admission INP armband ID verified with patient/family, and in place. SR up x 2, fall risk assessment complete with Patient and family verbalizing understanding of risks associated with falls. Pt verbalizes an understanding of how to use the call bell and to call for help before getting out of bed.  Skin, clean-dry- intact without evidence of bruising, or skin tears.   No evidence of skin break down noted on exam. color normal, vascularity normal, no rashes or suspicious lesions, no evidence of bleeding or bruising, no lesions noted, no rash, no edema, temperature normal, texture normal, mobility and turgor normal    Will cont to monitor and assist as needed.  Camillo FlamingVicki L Tarun Patchell, RN 12/23/2016 6:48 PM

## 2016-12-23 NOTE — ED Notes (Signed)
Patient returned from EEG

## 2016-12-23 NOTE — Procedures (Signed)
ELECTROENCEPHALOGRAM REPORT  Date of Study: 12/23/2016  Patient's Name: Dawn Beasley MRN: [id number] Date of Birth: 05/08/1951  Referring Provider: Felicie Mornavid Smith, PA-C  Clinical History: This is a 66 year old woman with altered mental status  Medications: cefTRIAXone (ROCEPHIN) 1 g in dextrose 5 % 50 mL IVPB  clonazePAM (KLONOPIN) tablet 1 mg  cloNIDine (CATAPRES) tablet 0.1 mg   Technical Summary: A multichannel digital EEG recording measured by the international 10-20 system with electrodes applied with paste and impedances below 5000 ohms performed as portable with EKG monitoring in an awake and confused patient.  Hyperventilation and photic stimulation were not performed.  The digital EEG was referentially recorded, reformatted, and digitally filtered in a variety of bipolar and referential montages for optimal display.   Description: The patient is awake and confused during the recording.  During maximal wakefulness, there is a symmetric, medium voltage 7-7.5 Hz posterior dominant rhythm that attenuates with eye opening. This is admixed with a small amount of diffuse 4-5 Hz theta slowing of the waking background.  Sleep architecture was not seen. Hyperventilation and photic stimulation were not performed. The study is limited by muscle artifact over the frontal and temporal leads, in between artifact, there were no epileptiform discharges or electrographic seizures seen.    Technical difficulties with EKG lead.  Impression: This awake and asleep EEG is abnormal due to mild diffuse slowing of the waking background.  Clinical Correlation of the above findings indicates diffuse cerebral dysfunction that is non-specific in etiology and can be seen with hypoxic/ischemic injury, toxic/metabolic encephalopathies, neurodegenerative disorders, or medication effect. This is a limited study due to muscle artifact obscuring frontal and temporal leads. The absence of epileptiform discharges does  not rule out a clinical diagnosis of epilepsy.  Clinical correlation is advised.   Patrcia DollyKaren Yazen Rosko, M.D.

## 2016-12-23 NOTE — ED Notes (Signed)
CBG- 92 

## 2016-12-23 NOTE — Progress Notes (Signed)
EEG Completed; Results Pending  

## 2016-12-23 NOTE — ED Provider Notes (Signed)
[facility]-EMERGENCY DEPT Provider Note   CSN: [id number] Arrival date & time: 12/23/16  0810     History   Chief Complaint Chief Complaint  Patient presents with  . Altered Mental Status    HPI Dawn Beasley is a 66 y.o. female.  The history is provided by a relative and the patient.  Altered Mental Status   This is a new problem. The current episode started 12 to 24 hours ago. The problem has not changed since onset.Associated symptoms include confusion. Associated symptoms comments: Speaking in incomprehensible language with normal cadence. Risk factors: Polypharmacy and chronic opioid use. Her past medical history does not include CVA.    Past Medical History:  Diagnosis Date  . Anxiety   . Arthritis   . Back pain   . Hypertension   . Stenosis, spinal, lumbar     Patient Active Problem List   Diagnosis Date Noted  . Chronic pain 12/06/2015  . Lumbar spinal stenosis 04/12/2014    Past Surgical History:  Procedure Laterality Date  . APPENDECTOMY    . BACK SURGERY    . CARPAL TUNNEL RELEASE    . LUMBAR LAMINECTOMY/DECOMPRESSION MICRODISCECTOMY N/A 04/12/2014   Procedure: MICRO LUMBAR DECOMPRESSION LUMBAR THREE TO FOUR,LUMBAR  FOUR TO FIVE;  Surgeon: Javier Docker, MD;  Location: [facility];  Service: Orthopedics;  Laterality: N/A;  . MAXOFACIAL SURGERY    . SPINAL CORD STIMULATOR INSERTION N/A 12/06/2015   Procedure: LUMBAR SPINAL CORD STIMULATOR INSERTION;  Surgeon: Venita Lick, MD;  Location: [facility] OR;  Service: Orthopedics;  Laterality: N/A;    OB History    No data available       Home Medications    Prior to Admission medications   Medication Sig Start Date End Date Taking? Authorizing Provider  ALPRAZolam Prudy Feeler) 1 MG tablet Take 1 mg by mouth 2 (two) times daily.     Historical Provider, MD  methocarbamol (ROBAXIN) 500 MG tablet Take 1 tablet (500 mg total) by mouth 3 (three) times daily as needed for muscle spasms. 12/06/15   Venita Lick, MD  ondansetron  (ZOFRAN) 4 MG tablet Take 1 tablet (4 mg total) by mouth every 8 (eight) hours as needed for nausea or vomiting. 12/06/15   Venita Lick, MD  oxyCODONE-acetaminophen (PERCOCET) 10-325 MG tablet Take 1 tablet by mouth every 4 (four) hours as needed for pain. 12/06/15   Venita Lick, MD  triamterene-hydrochlorothiazide (MAXZIDE-25) 37.5-25 MG per tablet Take 1 tablet by mouth every morning.    Historical Provider, MD    Family History History reviewed. No pertinent family history.  Social History Social History  Substance Use Topics  . Smoking status: Current Every Day Smoker    Packs/day: 0.50    Years: 40.00  . Smokeless tobacco: Not on file  . Alcohol use Yes     Comment: OCCASIONAL     Allergies   Celebrex [celecoxib] and Sulfa antibiotics   Review of Systems Review of Systems  Psychiatric/Behavioral: Positive for confusion.  All other systems reviewed and are negative.    Physical Exam Updated Vital Signs BP 137/96 (BP Location: Right Arm)   Pulse 116   Temp 99.1 F (37.3 C) (Oral)   Resp 24   SpO2 93%   Physical Exam  Constitutional: She is oriented to person, place, and time. She appears well-developed and well-nourished. No distress.  HENT:  Head: Normocephalic.  Nose: Nose normal.  Eyes: Conjunctivae are normal.  Neck: Neck supple. No tracheal deviation present.  Cardiovascular: Normal rate and regular rhythm.   Pulmonary/Chest: Effort normal. No respiratory distress.  Abdominal: Soft. She exhibits no distension.  Neurological: She is alert and oriented to person, place, and time.  Skin: Skin is warm and dry.  Psychiatric: She has a normal mood and affect.     ED Treatments / Results  Labs (all labs ordered are listed, but only abnormal results are displayed) Labs Reviewed  COMPREHENSIVE METABOLIC PANEL - Abnormal; Notable for the following:       Result Value   Sodium 128 (*)    Potassium 3.1 (*)    Chloride 82 (*)    Total Bilirubin 1.4 (*)      Anion gap 20 (*)    All other components within normal limits  CBC - Abnormal; Notable for the following:    WBC 10.9 (*)    Hemoglobin 17.1 (*)    HCT 47.6 (*)    MCV 101.9 (*)    MCH 36.6 (*)    All other components within normal limits  URINALYSIS, ROUTINE W REFLEX MICROSCOPIC - Abnormal; Notable for the following:    APPearance CLOUDY (*)    Hgb urine dipstick SMALL (*)    Ketones, ur 80 (*)    Protein, ur 100 (*)    Leukocytes, UA MODERATE (*)    Bacteria, UA FEW (*)    Squamous Epithelial / LPF TOO NUMEROUS TO COUNT (*)    All other components within normal limits  ACETAMINOPHEN LEVEL - Abnormal; Notable for the following:    Acetaminophen (Tylenol), Serum <10 (*)    All other components within normal limits  OSMOLALITY - Abnormal; Notable for the following:    Osmolality 271 (*)    All other components within normal limits  RAPID URINE DRUG SCREEN, HOSP PERFORMED - Abnormal; Notable for the following:    Opiates POSITIVE (*)    Benzodiazepines POSITIVE (*)    All other components within normal limits  I-STAT VENOUS BLOOD GAS, ED - Abnormal; Notable for the following:    pH, Ven 7.516 (*)    pCO2, Ven 39.7 (*)    Bicarbonate 32.1 (*)    Acid-Base Excess 8.0 (*)    All other components within normal limits  URINE CULTURE  AMMONIA  OSMOLALITY, URINE  SODIUM, URINE, RANDOM  ETHANOL  BASIC METABOLIC PANEL  URINALYSIS, ROUTINE W REFLEX MICROSCOPIC  BASIC METABOLIC PANEL  CBC  CBG MONITORING, ED  I-STAT TROPOININ, ED    EKG  EKG Interpretation  Date/Time:  Tuesday December 23 2016 10:54:46 EST Ventricular Rate:  111 PR Interval:    QRS Duration: 93 QT Interval:  345 QTC Calculation: 469 R Axis:   120 Text Interpretation:  Sinus tachycardia Biatrial enlargement Probable RVH w/ secondary repol abnormality ST depr, consider ischemia, inferior leads Confirmed by Valine Drozdowski MD, Adele Milson [id number]) on 12/23/2016 11:36:52 AM       Radiology Ct Angio Head W Or Wo  Contrast  Result Date: 12/23/2016 CLINICAL DATA:  Altered mental status and pressured speech. EXAM: CT ANGIOGRAPHY HEAD AND NECK TECHNIQUE: Multidetector CT imaging of the head and neck was performed using the standard protocol during bolus administration of intravenous contrast. Multiplanar CT image reconstructions and MIPs were obtained to evaluate the vascular anatomy. Carotid stenosis measurements (when applicable) are obtained utilizing NASCET criteria, using the distal internal carotid diameter as the denominator. CONTRAST:  50 mL Isovue 370 COMPARISON:  Noncontrast head CT earlier today FINDINGS: CT HEAD FINDINGS Brain: There is no  evidence of acute cortical infarct, intracranial hemorrhage, mass, midline shift, or extra-axial fluid collection. Ventricles and sulci are normal for age. Vascular: Mild calcified atherosclerosis at the skullbase. No hyperdense vessel. Skull: No fracture or focal osseous lesion. Sinuses: Prior internal fixation of maxillary sinus fractures. No significant inflammatory sinus disease. Minimal right mastoid air cell opacification. Orbits: Unremarkable. Review of the MIP images confirms the above findings CTA NECK FINDINGS Aortic arch: 3 vessel aortic arch with mild atherosclerotic plaque. Brachiocephalic and subclavian arteries are widely patent. Right carotid system: Mild calcified and soft plaque in the proximal ICA without significant stenosis. No evidence of dissection. Left carotid system: Minimal calcified and soft plaque at the carotid bifurcation and in the proximal ICA without stenosis. No evidence of dissection. Vertebral arteries: Patent and codominant without evidence of stenosis or dissection. Skeleton: Mild cervical disc degeneration, predominantly C4-C7. Slight retrolisthesis of C3 on C4. Other neck: No mass or lymph node enlargement. Upper chest: Mild centrilobular emphysema. Review of the MIP images confirms the above findings CTA HEAD FINDINGS Anterior circulation:  Internal carotid arteries are patent from skullbase to carotid termini. There is mild carotid siphon atherosclerosis bilaterally without significant stenosis. ACAs and MCAs are patent without evidence of significant A1 or M1 stenosis. There is prominent ACA and MCA branch vessel atherosclerotic type irregularity bilaterally with focal regions of up to severe stenosis. No aneurysm. Posterior circulation: Intracranial vertebral arteries are widely patent to the basilar. PICA, AICA, and SCA origins are patent. Basilar artery is widely patent. Posterior communicating arteries are not identified. PCAs are patent with mild atherosclerotic type irregularity bilaterally as well as mild right greater than left P2 segment narrowing. No aneurysm. Venous sinuses: Patent. Dominant right transverse and right sigmoid sinuses. Anatomic variants: None. Delayed phase: No abnormal enhancement. Review of the MIP images confirms the above findings IMPRESSION: 1. No large vessel occlusion. 2. Prominent intracranial branch vessel atherosclerosis. No significant large vessel stenosis. 3. Mild cervical carotid atherosclerosis without stenosis. Electronically Signed   By: Sebastian Ache M.D.   On: 12/23/2016 15:35   Ct Head Wo Contrast  Result Date: 12/23/2016 CLINICAL DATA:  Altered mental status. EXAM: CT HEAD WITHOUT CONTRAST TECHNIQUE: Contiguous axial images were obtained from the base of the skull through the vertex without intravenous contrast. COMPARISON:  None. FINDINGS: Brain: No evidence of acute infarction, hemorrhage, hydrocephalus, extra-axial collection or mass lesion/mass effect. Vascular: No hyperdense vessel or unexpected calcification. Skull: Normal. Negative for fracture or focal lesion. Sinuses/Orbits: Right maxillary mucous retention cyst is noted. Other: None. IMPRESSION: No acute intracranial abnormality seen. Electronically Signed   By: Lupita Raider, M.D.   On: 12/23/2016 10:42   Ct Angio Neck W Or Wo  Contrast  Result Date: 12/23/2016 CLINICAL DATA:  Altered mental status and pressured speech. EXAM: CT ANGIOGRAPHY HEAD AND NECK TECHNIQUE: Multidetector CT imaging of the head and neck was performed using the standard protocol during bolus administration of intravenous contrast. Multiplanar CT image reconstructions and MIPs were obtained to evaluate the vascular anatomy. Carotid stenosis measurements (when applicable) are obtained utilizing NASCET criteria, using the distal internal carotid diameter as the denominator. CONTRAST:  50 mL Isovue 370 COMPARISON:  Noncontrast head CT earlier today FINDINGS: CT HEAD FINDINGS Brain: There is no evidence of acute cortical infarct, intracranial hemorrhage, mass, midline shift, or extra-axial fluid collection. Ventricles and sulci are normal for age. Vascular: Mild calcified atherosclerosis at the skullbase. No hyperdense vessel. Skull: No fracture or focal osseous lesion. Sinuses: Prior internal  fixation of maxillary sinus fractures. No significant inflammatory sinus disease. Minimal right mastoid air cell opacification. Orbits: Unremarkable. Review of the MIP images confirms the above findings CTA NECK FINDINGS Aortic arch: 3 vessel aortic arch with mild atherosclerotic plaque. Brachiocephalic and subclavian arteries are widely patent. Right carotid system: Mild calcified and soft plaque in the proximal ICA without significant stenosis. No evidence of dissection. Left carotid system: Minimal calcified and soft plaque at the carotid bifurcation and in the proximal ICA without stenosis. No evidence of dissection. Vertebral arteries: Patent and codominant without evidence of stenosis or dissection. Skeleton: Mild cervical disc degeneration, predominantly C4-C7. Slight retrolisthesis of C3 on C4. Other neck: No mass or lymph node enlargement. Upper chest: Mild centrilobular emphysema. Review of the MIP images confirms the above findings CTA HEAD FINDINGS Anterior circulation:  Internal carotid arteries are patent from skullbase to carotid termini. There is mild carotid siphon atherosclerosis bilaterally without significant stenosis. ACAs and MCAs are patent without evidence of significant A1 or M1 stenosis. There is prominent ACA and MCA branch vessel atherosclerotic type irregularity bilaterally with focal regions of up to severe stenosis. No aneurysm. Posterior circulation: Intracranial vertebral arteries are widely patent to the basilar. PICA, AICA, and SCA origins are patent. Basilar artery is widely patent. Posterior communicating arteries are not identified. PCAs are patent with mild atherosclerotic type irregularity bilaterally as well as mild right greater than left P2 segment narrowing. No aneurysm. Venous sinuses: Patent. Dominant right transverse and right sigmoid sinuses. Anatomic variants: None. Delayed phase: No abnormal enhancement. Review of the MIP images confirms the above findings IMPRESSION: 1. No large vessel occlusion. 2. Prominent intracranial branch vessel atherosclerosis. No significant large vessel stenosis. 3. Mild cervical carotid atherosclerosis without stenosis. Electronically Signed   By: Sebastian AcheAllen  Grady M.D.   On: 12/23/2016 15:35    Procedures Procedures (including critical care time)  Medications Ordered in ED Medications  amLODipine (NORVASC) tablet 10 mg (0 mg Oral Hold 12/23/16 1735)  busPIRone (BUSPAR) tablet 7.5 mg (not administered)  gabapentin (NEURONTIN) capsule 300 mg (0 mg Oral Hold 12/23/16 1736)  metoprolol tartrate (LOPRESSOR) tablet 25 mg (0 mg Oral Hold 12/23/16 1736)  pravastatin (PRAVACHOL) tablet 40 mg (0 mg Oral Hold 12/23/16 1736)  sodium chloride flush (NS) 0.9 % injection 3 mL (not administered)  thiamine (B-1) injection 100 mg (100 mg Intravenous Given 12/23/16 1600)  LORazepam (ATIVAN) injection 0.5 mg (0.5 mg Intravenous Given 12/23/16 1708)  oxyCODONE-acetaminophen (PERCOCET/ROXICET) 5-325 MG per tablet 1 tablet (not  administered)    And  oxyCODONE (Oxy IR/ROXICODONE) immediate release tablet 5 mg (not administered)  sodium chloride 0.9 % bolus 1,000 mL (0 mLs Intravenous Stopped 12/23/16 1500)  iopamidol (ISOVUE-370) 76 % injection (50 mLs  Contrast Given 12/23/16 1449)  sodium chloride 0.9 % bolus 1,000 mL (1,000 mLs Intravenous New Bag/Given 12/23/16 1708)     Initial Impression / Assessment and Plan / ED Course  I have reviewed the triage vital signs and the nursing notes.  Pertinent labs & imaging results that were available during my care of the patient were reviewed by me and considered in my medical decision making (see chart for details).     66 y.o. female presents with altered mental status starting last night. Has been in mild opioid withdrawal over the last week after overdosing her chronic pain regimen at home. Also on chronic high dose of xanax. Speech here is scattered with aphasia but appears to be understanding speech and unable to respond  appropriately. Hyponatremia evident but not to degree expected to produce symptoms. Neurology consulted for imaging recommendations to r/o CNS pathology after CT negative. Heavy pyuria on contaminated sample suggests possible UTI, will treat empirically with rocephin pending culture. Suspect encephalopathy from infection, polypharmacy or benzo withdrawal. Family practice was consulted for admission and will see the patient in the emergency department.   Final Clinical Impressions(s) / ED Diagnoses   Final diagnoses:  Encephalopathy acute  Broca's aphasia  Acute hyponatremia    New Prescriptions New Prescriptions   No medications on file     Lyndal Pulley, MD 12/23/16 1747

## 2016-12-23 NOTE — Consult Note (Signed)
NEURO HOSPITALIST CONSULT NOTE   Requestig physician: Dr. Perley Jain   Reason for Consult: AMS   History obtained from:  husband  HPI:                                                                                                                                          Dawn Beasley is an 66 y.o. female brought to the hospital secondary to altered mental status. The history is very complicated as the husband is really unsure of what the wife is doing with her medications and the wife is very confused and talking in word salad. She also has extremely tangential thoughts and cannot concentrate on one thing. In fact she is not able to stop talking even though I am trying to commonly settle her down. When I do gather is that she has chronic back pain and has been taking more narcotics then she has been supposed to. Patient ran out early and was given pain medications that were her husband's and order to keep her pain down. Patient is also supposed to be on Xanax 1 mg twice a day. Her drug screen showed no benzodiazepines and patient states that she's been taking more benzos then she has been supposed to in fact she might of been taking up to 4-6 pills a day. Unfortunately there is no Xanax bottle with her and I cannot see when they were refilled. Currently she has a heart rate of 133 and appears extremely anxious and has tangential thoughts. She is able to repeat what I have said, follow commands, speak clearly at times, name objects.  Past Medical History:  Diagnosis Date  . Anxiety   . Arthritis   . Back pain   . Hypertension   . Stenosis, spinal, lumbar     Past Surgical History:  Procedure Laterality Date  . APPENDECTOMY    . BACK SURGERY    . CARPAL TUNNEL RELEASE    . LUMBAR LAMINECTOMY/DECOMPRESSION MICRODISCECTOMY N/A 04/12/2014   Procedure: MICRO LUMBAR DECOMPRESSION LUMBAR THREE TO FOUR,LUMBAR  FOUR TO FIVE;  Surgeon: Javier Docker, MD;  Location: [facility];   Service: Orthopedics;  Laterality: N/A;  . MAXOFACIAL SURGERY    . SPINAL CORD STIMULATOR INSERTION N/A 12/06/2015   Procedure: LUMBAR SPINAL CORD STIMULATOR INSERTION;  Surgeon: Venita Lick, MD;  Location: [facility];  Service: Orthopedics;  Laterality: N/A;    History reviewed. No pertinent family history.    Social History:  reports that she has been smoking.  She has a 20.00 pack-year smoking history. She does not have any smokeless tobacco history on file. She reports that she drinks alcohol. She reports that she does not use drugs.  Allergies  Allergen Reactions  . Celebrex [Celecoxib] Swelling    Throat  swelling  . Sulfa Antibiotics Swelling    Throat swelling    MEDICATIONS:                                                                                                                     Current Facility-Administered Medications  Medication Dose Route Frequency Provider Last Rate Last Dose  . cefTRIAXone (ROCEPHIN) 1 g in dextrose 5 % 50 mL IVPB  1 g Intravenous Once Lyndal Pulley, MD      . cloNIDine (CATAPRES) tablet 0.1 mg  0.1 mg Oral Once Lyndal Pulley, MD       Current Outpatient Prescriptions  Medication Sig Dispense Refill  . amLODipine (NORVASC) 10 MG tablet Take 10 mg by mouth daily.  0  . busPIRone (BUSPAR) 15 MG tablet Take 7.5 mg by mouth 2 (two) times daily.   5  . gabapentin (NEURONTIN) 300 MG capsule Take 300 mg by mouth 3 (three) times daily.  1  . hydrOXYzine (VISTARIL) 50 MG capsule Take 50 mg by mouth 3 (three) times daily as needed for anxiety.  0  . methocarbamol (ROBAXIN) 500 MG tablet Take 1 tablet (500 mg total) by mouth 3 (three) times daily as needed for muscle spasms. 60 tablet 0  . metoprolol tartrate (LOPRESSOR) 25 MG tablet Take 25 mg by mouth daily.  4  . oxyCODONE-acetaminophen (PERCOCET) 10-325 MG tablet Take 1 tablet by mouth every 4 (four) hours as needed for pain. 60 tablet 0  . pravastatin (PRAVACHOL) 40 MG tablet Take 40 mg by mouth daily.   5  . triamterene-hydrochlorothiazide (MAXZIDE-25) 37.5-25 MG per tablet Take 1.5 tablets by mouth every morning.     Marland Kitchen ALPRAZolam (XANAX) 1 MG tablet Take 1 mg by mouth 2 (two) times daily.     . ondansetron (ZOFRAN) 4 MG tablet Take 1 tablet (4 mg total) by mouth every 8 (eight) hours as needed for nausea or vomiting. (Patient not taking: Reported on 12/23/2016) 20 tablet 0      ROS:                                                                                                                                       History obtained from unobtainable from patient due to mental status    Blood pressure 156/61, pulse 109, temperature 99.1 F (37.3 C), temperature source Oral, resp. rate (!) 27, SpO2 95 %.  Neurologic Examination:                                                                                                      HEENT-  Normocephalic, no lesions, without obvious abnormality.  Normal external eye and conjunctiva.  Normal TM's bilaterally.  Normal auditory canals and external ears. Normal external nose, mucus membranes and septum.  Normal pharynx. Cardiovascular- S1, S2 normal, pulses palpable throughout   Lungs- chest clear, no wheezing, rales, normal symmetric air entry Abdomen- normal findings: bowel sounds normal Extremities- no edema Lymph-no adenopathy palpable Musculoskeletal-no joint tenderness, deformity or swelling Skin-warm and dry, no hyperpigmentation, vitiligo, or suspicious lesions  Neurological Examination Mental Status: Alert, not oriented, able to follow simple commands and talking with pressured speech at time with word salad and other times tangentially. She is able to name objects, repeat, express herself if she slows her though process down Cranial Nerves: II: blinks to threat bilaterally pupils equal, round, reactive to light and accommodation III,IV, VI: ptosis not present, extra-ocular motions intact bilaterally V,VII: smile symmetric, facial  light touch sensation normal bilaterally VIII: hearing normal bilaterally IX,X: uvula rises symmetrically XI: bilateral shoulder shrug XII: midline tongue extension Motor: Right : Upper extremity   5/5    Left:     Upper extremity   5/5  Lower extremity   5/5     Lower extremity   5/5 Tone and bulk:normal tone throughout; no atrophy noted Sensory: Pinprick and light touch intact throughout, bilaterally Deep Tendon Reflexes: 2+ and symmetric throughout Plantars: Right: downgoing   Left: downgoing Cerebellar: normal finger-to-nose,  Gait: not tested      Lab Results: Basic Metabolic Panel:  Recent Labs Lab 12/23/16 0926  NA 128*  K 3.1*  CL 82*  CO2 26  GLUCOSE 91  BUN 12  CREATININE 0.86  CALCIUM 9.6    Liver Function Tests:  Recent Labs Lab 12/23/16 0926  AST 33  ALT 23  ALKPHOS 53  BILITOT 1.4*  PROT 7.1  ALBUMIN 4.1   No results for input(s): LIPASE, AMYLASE in the last 168 hours.  Recent Labs Lab 12/23/16 0955  AMMONIA 23    CBC:  Recent Labs Lab 12/23/16 0926  WBC 10.9*  HGB 17.1*  HCT 47.6*  MCV 101.9*  PLT 277    Cardiac Enzymes: No results for input(s): CKTOTAL, CKMB, CKMBINDEX, TROPONINI in the last 168 hours.  Lipid Panel: No results for input(s): CHOL, TRIG, HDL, CHOLHDL, VLDL, LDLCALC in the last 168 hours.  CBG:  Recent Labs Lab 12/23/16 1012  GLUCAP 92    Microbiology: Results for orders placed or performed during the hospital encounter of 11/29/15  Surgical pcr screen     Status: Abnormal   Collection Time: 11/29/15  9:18 AM  Result Value Ref Range Status   MRSA, PCR NEGATIVE NEGATIVE Final   Staphylococcus aureus POSITIVE (A) NEGATIVE Final    Comment:        The Xpert SA Assay (FDA approved for NASAL specimens in patients over 32 years of age), is one component of  a comprehensive surveillance program.  Test performance has been validated by [facility] for patients greater than or equal to 1 year  old. It is not intended to diagnose infection nor to guide or monitor treatment.     Coagulation Studies: No results for input(s): LABPROT, INR in the last 72 hours.  Imaging: Ct Head Wo Contrast  Result Date: 12/23/2016 CLINICAL DATA:  Altered mental status. EXAM: CT HEAD WITHOUT CONTRAST TECHNIQUE: Contiguous axial images were obtained from the base of the skull through the vertex without intravenous contrast. COMPARISON:  None. FINDINGS: Brain: No evidence of acute infarction, hemorrhage, hydrocephalus, extra-axial collection or mass lesion/mass effect. Vascular: No hyperdense vessel or unexpected calcification. Skull: Normal. Negative for fracture or focal lesion. Sinuses/Orbits: Right maxillary mucous retention cyst is noted. Other: None. IMPRESSION: No acute intracranial abnormality seen. Electronically Signed   By: Lupita RaiderJames  Green Jr, M.D.   On: 12/23/2016 10:42       Assessment and plan per attending neurologist  Felicie Mornavid Ezzard Ditmer PA-C [facility] [phone]  12/23/2016, 1:01 PM   Assessment/Plan: Given the unknown of her narcotics abuse/uses and the inability to know how she has been taking her Xanax, there is large speculation of medication miss use and likely withdrawal. Less likely stroke. Patient is unable to obtain MRI due to spinal simulator.   Recommend: 1) place on Clonazepam 1 mg BID 2) EEG 3 CTA head and neck.

## 2016-12-23 NOTE — ED Notes (Signed)
Admitting MDs at bedside.

## 2016-12-23 NOTE — ED Notes (Signed)
Neuro PA at bedside.  

## 2016-12-23 NOTE — ED Notes (Signed)
Patient is NPO at this time, No Diet was ordered for Dinner.

## 2016-12-23 NOTE — H&P (Signed)
Family Medicine Teaching [facility] Admission History and Physical Service Pager: [phone]  Patient name: Dawn Beasley Medical record number: [id number] Date of birth: [birth year] Age: 66 y.o. Gender: female  Primary Care Provider: Lonie Peak, PA-C Consultants: Neurology Code Status: Full  Chief Complaint: AMS, abnormal speech  Assessment and Plan: DONISE WOODLE is a 66 y.o. female presenting with change in speech. PMH is significant for lumbar spinal stenosis s/p SCS placement last year, HTN, HLD, arthritis, and anxiety.   #AMS/speech change: Patient unable to verbalize normally, speaking "word salad" but not aphasic as able to name objects on Neuro exam. CT head without any acute abnormalities. Cannot obtain MRI brain due to SCS. No focal weakness on exam but had difficulty walking around house prior to presentation. Tachycardic to 124 with dilated and sluggish pupils on exam, consistent with benzo withdrawal. UDS positive for opiates and benzodiazepines. Husband was not aware of patient taking xanax, but it is listed on her medication list but not part of pill bottles brought today. Patient unable to confirm. Mild leukocytosis to 10.9. Husband denies patient complaining of any dysuria or fevers prior to onset of symptoms. Hyponatremia could also be contributing but Na only 128. - Admit inpatient, Attending Dr. McDiarmid - Neurology consulting, appreciate recommendations: to have EEG, CTA head/neck to r/o intracranial abnormality - IV ativan 0.5 mg q6h scheduled - CIWA protocol ordered for alcohol history - Swallow eval (failed ED bedside eval) - r/o UTI with I/O cath UA, given WBC of 10.9.  - PT/OT evaluation  #Hyponatremia: 128 on admission. Baseline around 140 (last known 11/29/15). Osmolality mildly low at 271. Husband says she drinks a lot of water. Also taking HCTZ as on of her medications. Appears slightly dehydrated on exam, so could consider hypovolemia.  - Urine sodium <  10  - Urine osmolality WNL at 443 - Hold maxide.  - Obtain TSH - Recheck urine sodium in a.m. after MIVFs overnight  #Chronic pain: - Takes 90 morphine equivalents of percocet at home. Since not taking good PO, will give as IV at half home needs. - Morphine 7 mg IV q4h - Restart gabapentin as able - Restart robaxin as able - Switch to PO once passes swallow eval  #HTN: - Holding home norvasc 10 mg daily, metoprolol 25 mg daily, and triamterene-HCTZ 37.5-25 mg (1.5 tabs) daily while NPO - Will give 5 mg IV metoprolol prn  #Anxiety: - Holding xanax and vistaril - Restart buspar 7.5 mg BID when taking PO - Ativan as above  #HLD: - Continue home pravastatin 40 mg as able  FEN/GI: NPO until swallow eval, NS 100 mL/hr Prophylaxis: Lovenox SQ  Disposition: Pending improvement in mental status  History of Present Illness:  Dawn Beasley is a 66 y.o. female presenting with AMS/change in speech. She woke up last night around 1 a.m. and was not making sense and "babbling" to her husband. He says she last had normal speech prior to going to bed that night. He reports no changes in medications recently, though she was taking more percocet pills such that she ran out 4 days early but restarted with her normal refill yesterday. Her husband had been giving her some of his norco in the interim. Husband reports her pain has been worse of the last few years and that his wife is "not well off like the rest of [place]" due to her back problems but used to be fit and very active in her younger years. Only recent  stressor was that one of their family dogs died. Husband says she is normally very "hyper" but has never been hospitalized for mood issues, and no family members have known mood issues. Patient has not been eating as much lately--"eating like a bird," having just half a banana and a glass of milk over the last few days but has had a decent amount of water, per husband. He did not think she had been taking  xanax but says she had been on klonopin in the past but was weaned off. She drinks about 3 glasses of wine a night to help with nerves but has not had a drink since Thursday.   Review Of Systems: Per HPI with the following additions:   Review of Systems  Constitutional: Negative for fever and weight loss.  HENT: Negative for hearing loss and nosebleeds.   Respiratory: Negative for cough and wheezing.   Cardiovascular: Negative for chest pain and leg swelling.  Gastrointestinal: Positive for constipation. Negative for nausea and vomiting.  Genitourinary: Positive for frequency. Negative for dysuria.  Musculoskeletal: Positive for back pain. Negative for neck pain.  Skin: Negative for rash.  Neurological: Positive for speech change. Negative for focal weakness.  Psychiatric/Behavioral: The patient is nervous/anxious and has insomnia.     Patient Active Problem List   Diagnosis Date Noted  . Encephalopathy acute 12/23/2016  . Chronic pain 12/06/2015  . Lumbar spinal stenosis 04/12/2014    Past Medical History: Past Medical History:  Diagnosis Date  . Anxiety   . Arthritis   . Back pain   . Hypertension   . Stenosis, spinal, lumbar     Past Surgical History: Past Surgical History:  Procedure Laterality Date  . APPENDECTOMY    . BACK SURGERY    . CARPAL TUNNEL RELEASE    . LUMBAR LAMINECTOMY/DECOMPRESSION MICRODISCECTOMY N/A 04/12/2014   Procedure: MICRO LUMBAR DECOMPRESSION LUMBAR THREE TO FOUR,LUMBAR  FOUR TO FIVE;  Surgeon: Javier Docker, MD;  Location: [facility];  Service: Orthopedics;  Laterality: N/A;  . MAXOFACIAL SURGERY    . SPINAL CORD STIMULATOR INSERTION N/A 12/06/2015   Procedure: LUMBAR SPINAL CORD STIMULATOR INSERTION;  Surgeon: Venita Lick, MD;  Location: [facility];  Service: Orthopedics;  Laterality: N/A;    Social History: Social History  Substance Use Topics  . Smoking status: Current Every Day Smoker    Packs/day: 0.50    Years: 40.00  . Smokeless  tobacco: Not on file  . Alcohol use Yes     Comment: OCCASIONAL   Additional social history: 2 dogs and lives with husband Please also refer to relevant sections of EMR.  Family History: History reviewed. No pertinent family history. Sister died from lung cancer Mother died from CHF Father died of TB Twin brothers and another brother relatively healthy  Allergies and Medications: Allergies  Allergen Reactions  . Celebrex [Celecoxib] Swelling    Throat swelling  . Sulfa Antibiotics Swelling    Throat swelling   No current facility-administered medications on file prior to encounter.    Current Outpatient Prescriptions on File Prior to Encounter  Medication Sig Dispense Refill  . methocarbamol (ROBAXIN) 500 MG tablet Take 1 tablet (500 mg total) by mouth 3 (three) times daily as needed for muscle spasms. 60 tablet 0  . oxyCODONE-acetaminophen (PERCOCET) 10-325 MG tablet Take 1 tablet by mouth every 4 (four) hours as needed for pain. 60 tablet 0  . triamterene-hydrochlorothiazide (MAXZIDE-25) 37.5-25 MG per tablet Take 1.5 tablets by mouth every morning.     Marland Kitchen  ALPRAZolam (XANAX) 1 MG tablet Take 1 mg by mouth 2 (two) times daily.     . ondansetron (ZOFRAN) 4 MG tablet Take 1 tablet (4 mg total) by mouth every 8 (eight) hours as needed for nausea or vomiting. (Patient not taking: Reported on 12/23/2016) 20 tablet 0    Objective: BP 156/61   Pulse 109   Temp 99.1 F (37.3 C) (Oral)   Resp (!) 27   SpO2 95%  Exam: General: Older female in NAD, resting in bed, appears slightly agitated Eyes: EOMI, pupils dilated and sluggish to constrict to light ENTM: No nasal congestion. Normal posterior oropharynx. Neck: Supple. FROM.  Cardiovascular: Mildly tachycardic, normal S1/S2, no m/r/g Respiratory: Speaking quickly with ease. [place] in place though no desaturation events. No wheezing or focal findings on lung exam, moving air well throughout.  Gastrointestinal: +BS, soft, NT, slightly  distended and tympanic to percussion MSK: Strength 5/5 of UE and LEs. Normal tone.  Derm: No rashes. No bruising on exposed skin.  Neuro: CNII-XII intact. Can name objects. Some rhyming/sing-songy quality to speech.  Psych: Pressured speech. Anxious appearing.   Labs and Imaging: CBC BMET   Recent Labs Lab 12/23/16 0926  WBC 10.9*  HGB 17.1*  HCT 47.6*  PLT 277    Recent Labs Lab 12/23/16 0926  NA 128*  K 3.1*  CL 82*  CO2 26  BUN 12  CREATININE 0.86  GLUCOSE 91  CALCIUM 9.6     Ct Angio Head W Or Wo Contrast  Result Date: 12/23/2016 CLINICAL DATA:  Altered mental status and pressured speech. EXAM: CT ANGIOGRAPHY HEAD AND NECK TECHNIQUE: Multidetector CT imaging of the head and neck was performed using the standard protocol during bolus administration of intravenous contrast. Multiplanar CT image reconstructions and MIPs were obtained to evaluate the vascular anatomy. Carotid stenosis measurements (when applicable) are obtained utilizing NASCET criteria, using the distal internal carotid diameter as the denominator. CONTRAST:  50 mL Isovue 370 COMPARISON:  Noncontrast head CT earlier today FINDINGS: CT HEAD FINDINGS Brain: There is no evidence of acute cortical infarct, intracranial hemorrhage, mass, midline shift, or extra-axial fluid collection. Ventricles and sulci are normal for age. Vascular: Mild calcified atherosclerosis at the skullbase. No hyperdense vessel. Skull: No fracture or focal osseous lesion. Sinuses: Prior internal fixation of maxillary sinus fractures. No significant inflammatory sinus disease. Minimal right mastoid air cell opacification. Orbits: Unremarkable. Review of the MIP images confirms the above findings CTA NECK FINDINGS Aortic arch: 3 vessel aortic arch with mild atherosclerotic plaque. Brachiocephalic and subclavian arteries are widely patent. Right carotid system: Mild calcified and soft plaque in the proximal ICA without significant stenosis. No  evidence of dissection. Left carotid system: Minimal calcified and soft plaque at the carotid bifurcation and in the proximal ICA without stenosis. No evidence of dissection. Vertebral arteries: Patent and codominant without evidence of stenosis or dissection. Skeleton: Mild cervical disc degeneration, predominantly C4-C7. Slight retrolisthesis of C3 on C4. Other neck: No mass or lymph node enlargement. Upper chest: Mild centrilobular emphysema. Review of the MIP images confirms the above findings CTA HEAD FINDINGS Anterior circulation: Internal carotid arteries are patent from skullbase to carotid termini. There is mild carotid siphon atherosclerosis bilaterally without significant stenosis. ACAs and MCAs are patent without evidence of significant A1 or M1 stenosis. There is prominent ACA and MCA branch vessel atherosclerotic type irregularity bilaterally with focal regions of up to severe stenosis. No aneurysm. Posterior circulation: Intracranial vertebral arteries are widely patent to the  basilar. PICA, AICA, and SCA origins are patent. Basilar artery is widely patent. Posterior communicating arteries are not identified. PCAs are patent with mild atherosclerotic type irregularity bilaterally as well as mild right greater than left P2 segment narrowing. No aneurysm. Venous sinuses: Patent. Dominant right transverse and right sigmoid sinuses. Anatomic variants: None. Delayed phase: No abnormal enhancement. Review of the MIP images confirms the above findings IMPRESSION: 1. No large vessel occlusion. 2. Prominent intracranial branch vessel atherosclerosis. No significant large vessel stenosis. 3. Mild cervical carotid atherosclerosis without stenosis. Electronically Signed   By: Sebastian AcheAllen  Grady M.D.   On: 12/23/2016 15:35   Ct Head Wo Contrast  Result Date: 12/23/2016 CLINICAL DATA:  Altered mental status. EXAM: CT HEAD WITHOUT CONTRAST TECHNIQUE: Contiguous axial images were obtained from the base of the skull  through the vertex without intravenous contrast. COMPARISON:  None. FINDINGS: Brain: No evidence of acute infarction, hemorrhage, hydrocephalus, extra-axial collection or mass lesion/mass effect. Vascular: No hyperdense vessel or unexpected calcification. Skull: Normal. Negative for fracture or focal lesion. Sinuses/Orbits: Right maxillary mucous retention cyst is noted. Other: None. IMPRESSION: No acute intracranial abnormality seen. Electronically Signed   By: Lupita RaiderJames  Green Jr, M.D.   On: 12/23/2016 10:42   Ct Angio Neck W Or Wo Contrast  Result Date: 12/23/2016 CLINICAL DATA:  Altered mental status and pressured speech. EXAM: CT ANGIOGRAPHY HEAD AND NECK TECHNIQUE: Multidetector CT imaging of the head and neck was performed using the standard protocol during bolus administration of intravenous contrast. Multiplanar CT image reconstructions and MIPs were obtained to evaluate the vascular anatomy. Carotid stenosis measurements (when applicable) are obtained utilizing NASCET criteria, using the distal internal carotid diameter as the denominator. CONTRAST:  50 mL Isovue 370 COMPARISON:  Noncontrast head CT earlier today FINDINGS: CT HEAD FINDINGS Brain: There is no evidence of acute cortical infarct, intracranial hemorrhage, mass, midline shift, or extra-axial fluid collection. Ventricles and sulci are normal for age. Vascular: Mild calcified atherosclerosis at the skullbase. No hyperdense vessel. Skull: No fracture or focal osseous lesion. Sinuses: Prior internal fixation of maxillary sinus fractures. No significant inflammatory sinus disease. Minimal right mastoid air cell opacification. Orbits: Unremarkable. Review of the MIP images confirms the above findings CTA NECK FINDINGS Aortic arch: 3 vessel aortic arch with mild atherosclerotic plaque. Brachiocephalic and subclavian arteries are widely patent. Right carotid system: Mild calcified and soft plaque in the proximal ICA without significant stenosis. No  evidence of dissection. Left carotid system: Minimal calcified and soft plaque at the carotid bifurcation and in the proximal ICA without stenosis. No evidence of dissection. Vertebral arteries: Patent and codominant without evidence of stenosis or dissection. Skeleton: Mild cervical disc degeneration, predominantly C4-C7. Slight retrolisthesis of C3 on C4. Other neck: No mass or lymph node enlargement. Upper chest: Mild centrilobular emphysema. Review of the MIP images confirms the above findings CTA HEAD FINDINGS Anterior circulation: Internal carotid arteries are patent from skullbase to carotid termini. There is mild carotid siphon atherosclerosis bilaterally without significant stenosis. ACAs and MCAs are patent without evidence of significant A1 or M1 stenosis. There is prominent ACA and MCA branch vessel atherosclerotic type irregularity bilaterally with focal regions of up to severe stenosis. No aneurysm. Posterior circulation: Intracranial vertebral arteries are widely patent to the basilar. PICA, AICA, and SCA origins are patent. Basilar artery is widely patent. Posterior communicating arteries are not identified. PCAs are patent with mild atherosclerotic type irregularity bilaterally as well as mild right greater than left P2 segment narrowing. No aneurysm.  Venous sinuses: Patent. Dominant right transverse and right sigmoid sinuses. Anatomic variants: None. Delayed phase: No abnormal enhancement. Review of the MIP images confirms the above findings IMPRESSION: 1. No large vessel occlusion. 2. Prominent intracranial branch vessel atherosclerosis. No significant large vessel stenosis. 3. Mild cervical carotid atherosclerosis without stenosis. Electronically Signed   By: Sebastian Ache M.D.   On: 12/23/2016 15:35    Kaiyah Eber Percell Boston, MD with [facility] [employer] 12/23/2016, 12:43 PM PGY-2, [facility] Family Medicine FPTS Intern pager: [phone], text pages welcome

## 2016-12-24 ENCOUNTER — Encounter (HOSPITAL_COMMUNITY): Payer: Self-pay | Admitting: Family Medicine

## 2016-12-24 DIAGNOSIS — Z79891 Long term (current) use of opiate analgesic: Secondary | ICD-10-CM

## 2016-12-24 DIAGNOSIS — M961 Postlaminectomy syndrome, not elsewhere classified: Secondary | ICD-10-CM

## 2016-12-24 DIAGNOSIS — Z9889 Other specified postprocedural states: Secondary | ICD-10-CM

## 2016-12-24 DIAGNOSIS — F17210 Nicotine dependence, cigarettes, uncomplicated: Secondary | ICD-10-CM

## 2016-12-24 DIAGNOSIS — F1194 Opioid use, unspecified with opioid-induced mood disorder: Secondary | ICD-10-CM

## 2016-12-24 DIAGNOSIS — Z79899 Other long term (current) drug therapy: Secondary | ICD-10-CM

## 2016-12-24 DIAGNOSIS — Z882 Allergy status to sulfonamides status: Secondary | ICD-10-CM

## 2016-12-24 DIAGNOSIS — Z888 Allergy status to other drugs, medicaments and biological substances status: Secondary | ICD-10-CM

## 2016-12-24 HISTORY — DX: Postlaminectomy syndrome, not elsewhere classified: M96.1

## 2016-12-24 LAB — CBC
HEMATOCRIT: 43.2 % (ref 36.0–46.0)
HEMOGLOBIN: 14.4 g/dL (ref 12.0–15.0)
MCH: 35.6 pg — AB (ref 26.0–34.0)
MCHC: 33.3 g/dL (ref 30.0–36.0)
MCV: 106.7 fL — AB (ref 78.0–100.0)
Platelets: 263 10*3/uL (ref 150–400)
RBC: 4.05 MIL/uL (ref 3.87–5.11)
RDW: 13.4 % (ref 11.5–15.5)
WBC: 9.6 10*3/uL (ref 4.0–10.5)

## 2016-12-24 LAB — VITAMIN B12: Vitamin B-12: 256 pg/mL (ref 180–914)

## 2016-12-24 LAB — BASIC METABOLIC PANEL
ANION GAP: 11 (ref 5–15)
BUN: 14 mg/dL (ref 6–20)
CHLORIDE: 98 mmol/L — AB (ref 101–111)
CO2: 29 mmol/L (ref 22–32)
Calcium: 8.5 mg/dL — ABNORMAL LOW (ref 8.9–10.3)
Creatinine, Ser: 0.63 mg/dL (ref 0.44–1.00)
GFR calc non Af Amer: >60 mL/min (ref >60)
Glucose, Bld: 90 mg/dL (ref 65–99)
POTASSIUM: 3.8 mmol/L (ref 3.5–5.1)
Sodium: 138 mmol/L (ref 135–145)

## 2016-12-24 LAB — URINE CULTURE: CULTURE: NO GROWTH

## 2016-12-24 LAB — RETICULOCYTES
RBC.: 4.18 MIL/uL (ref 3.87–5.11)
RETIC COUNT ABSOLUTE: 46 10*3/uL (ref 19.0–186.0)
RETIC CT PCT: 1.1 % (ref 0.4–3.1)

## 2016-12-24 LAB — TSH: TSH: 0.558 u[IU]/mL (ref 0.350–4.500)

## 2016-12-24 LAB — SODIUM, URINE, RANDOM: Sodium, Ur: 34 mmol/L

## 2016-12-24 LAB — FOLATE: Folate: >100 ng/mL (ref >5.9)

## 2016-12-24 MED ORDER — HALOPERIDOL LACTATE 5 MG/ML IJ SOLN: 0.5000 mg | Freq: Four times a day (QID) | INTRAMUSCULAR | Status: DC | PRN

## 2016-12-24 MED ORDER — NICOTINE 21 MG/24HR TD PT24: 21.0000 mg | MEDICATED_PATCH | Freq: Every day | TRANSDERMAL | Status: DC

## 2016-12-24 MED ORDER — AMLODIPINE BESYLATE 10 MG PO TABS
10.0000 mg | ORAL_TABLET | Freq: Every day | ORAL | Status: DC
  Administered 2016-12-24 – 2016-12-25 (×2): 10 mg via ORAL
  Filled 2016-12-24 (×2): qty 1

## 2016-12-24 MED ORDER — LORAZEPAM 2 MG/ML IJ SOLN
1.0000 mg | Freq: Once | INTRAMUSCULAR | Status: AC
  Administered 2016-12-24: 1 mg via INTRAVENOUS
  Filled 2016-12-24: qty 1

## 2016-12-24 MED ORDER — METOPROLOL TARTRATE 25 MG PO TABS
25.0000 mg | ORAL_TABLET | Freq: Every day | ORAL | Status: DC
  Administered 2016-12-24 – 2016-12-25 (×2): 25 mg via ORAL
  Filled 2016-12-24 (×2): qty 1

## 2016-12-24 MED ORDER — GABAPENTIN 600 MG PO TABS
300.0000 mg | ORAL_TABLET | Freq: Three times a day (TID) | ORAL | Status: DC
  Administered 2016-12-24: 300 mg via ORAL
  Filled 2016-12-24: qty 1

## 2016-12-24 MED ORDER — DIAZEPAM 5 MG/ML IJ SOLN
5.0000 mg | Freq: Once | INTRAMUSCULAR | Status: AC
  Administered 2016-12-24: 5 mg via INTRAVENOUS
  Filled 2016-12-24: qty 2

## 2016-12-24 MED ORDER — BUSPIRONE HCL 15 MG PO TABS
7.5000 mg | ORAL_TABLET | Freq: Two times a day (BID) | ORAL | Status: DC
  Administered 2016-12-24: 7.5 mg via ORAL
  Filled 2016-12-24: qty 1

## 2016-12-24 MED ORDER — QUETIAPINE FUMARATE 25 MG PO TABS
25.0000 mg | ORAL_TABLET | Freq: Three times a day (TID) | ORAL | Status: DC
  Administered 2016-12-24 – 2016-12-25 (×3): 25 mg via ORAL
  Filled 2016-12-24 (×3): qty 1

## 2016-12-24 MED ORDER — GABAPENTIN 600 MG PO TABS
600.0000 mg | ORAL_TABLET | Freq: Three times a day (TID) | ORAL | Status: DC
  Administered 2016-12-24 – 2016-12-25 (×2): 600 mg via ORAL
  Filled 2016-12-24 (×2): qty 1

## 2016-12-24 MED ORDER — LORAZEPAM 2 MG/ML IJ SOLN
0.5000 mg | Freq: Four times a day (QID) | INTRAMUSCULAR | Status: DC
  Administered 2016-12-24: 0.5 mg via INTRAVENOUS
  Filled 2016-12-24 (×2): qty 1

## 2016-12-24 MED ORDER — PRAVASTATIN SODIUM 40 MG PO TABS
40.0000 mg | ORAL_TABLET | Freq: Every day | ORAL | Status: DC
  Administered 2016-12-24: 40 mg via ORAL
  Filled 2016-12-24: qty 1

## 2016-12-24 MED ORDER — NICOTINE 21 MG/24HR TD PT24
21.0000 mg | MEDICATED_PATCH | Freq: Every day | TRANSDERMAL | Status: DC
Start: 2016-12-24 — End: 2016-12-25
  Administered 2016-12-24 – 2016-12-25 (×3): 21 mg via TRANSDERMAL
  Filled 2016-12-24 (×3): qty 1

## 2016-12-24 MED ORDER — THIAMINE HCL 100 MG/ML IJ SOLN
250.0000 mg | INTRAMUSCULAR | Status: DC
  Administered 2016-12-24 (×2): 250 mg via INTRAVENOUS
  Filled 2016-12-24 (×2): qty 4

## 2016-12-24 MED ORDER — DIAZEPAM 5 MG/ML IJ SOLN: 5.0000 mg | Freq: Once | INTRAMUSCULAR | Status: DC

## 2016-12-24 MED ORDER — RISPERIDONE 1 MG PO TABS
1.0000 mg | ORAL_TABLET | Freq: Every day | ORAL | Status: DC
  Administered 2016-12-24: 1 mg via ORAL
  Filled 2016-12-24: qty 1

## 2016-12-24 NOTE — Progress Notes (Signed)
Pt. Is alert and confused.  Constantly getting out of bed and talking non stop. Husband and son was at bedside at the beginning of the shift.  At that time, she was still holding conversation with herself but was in bed. When husband and son left, she started jumping out of bed and talking very loudly.  She was taken to the nurses station ; she continued with her loud talking but remained seated in the chair.  When the husband returned, she was taken back to her room and placed in bed;she continued talking and started jumping out of bed.  Because restraining her would only aggravate more, we rounded on her more frequently.  Her potassium was 2.7  MD paged and an order for replacement received. Also MD was paged that current dose of ativan was ineffective and an order for an increased dose of 1 mg was received. RN attempted to take vital sign but it was high related to pt agitation and not sitting still. She started mimicking smoking at which time the husband told RN that she is a smoker. MD was notified and an order for nicotine patch received. MD came to bedside to assess and observed patient. She left to consult with other MD and returned with an order to give valium.  Medication given.  Pt began to calm down and BP was repeated.  At this time it was within normal range.  Both patient and husband finally went to sleep.  Will continue to monitor.

## 2016-12-24 NOTE — Evaluation (Signed)
Clinical/Bedside Swallow Evaluation Patient Details  Name: Dawn Beasley MRN: [id number] Date of Birth: [birth year]  Today's Date: 12/24/2016 Time: SLP Start Time (ACUTE ONLY): 1022 SLP Stop Time (ACUTE ONLY): 1037 SLP Time Calculation (min) (ACUTE ONLY): 15 min  Past Medical History:  Past Medical History:  Diagnosis Date  . Anxiety   . Arthritis   . Back pain   . Hypertension   . Stenosis, spinal, lumbar    Past Surgical History:  Past Surgical History:  Procedure Laterality Date  . APPENDECTOMY    . BACK SURGERY    . CARPAL TUNNEL RELEASE    . LUMBAR LAMINECTOMY/DECOMPRESSION MICRODISCECTOMY N/A 04/12/2014   Procedure: MICRO LUMBAR DECOMPRESSION LUMBAR THREE TO FOUR,LUMBAR  FOUR TO FIVE;  Surgeon: Dawn DockerJeffrey C Beane, MD;  Location: [facility];  Service: Orthopedics;  Laterality: N/A;  . MAXOFACIAL SURGERY    . SPINAL CORD STIMULATOR INSERTION N/A 12/06/2015   Procedure: LUMBAR SPINAL CORD STIMULATOR INSERTION;  Surgeon: Dawn Lickahari Brooks, MD;  Location: [facility];  Service: Orthopedics;  Laterality: N/A;   HPI:  Dawn FraiseJudy C Elkinsis a 66 y.o.femalepresenting with AMS/change in speech. Head CT no acute intracranial abnormality seen. PMH: HTN, anxiety, back pain, stenosis (spinal, lumbar).    Assessment / Plan / Recommendation Clinical Impression  Pt anxious, hyperverbal with husband at bedside who reports "her speech and confusion is better than yesterday." Various consistencies consumed without indications of airway compromise; functional oral prep and transit with solid. No history of dysphagia except pt reports "gagging sometimes with pills." Recommend regular texture and thin liquids, straws allowed, pills with thin. No follow up needed.     Aspiration Risk  Mild aspiration risk    Diet Recommendation Regular;Thin liquid   Liquid Administration via: Cup;Straw Medication Administration: Whole meds with liquid Supervision: Patient able to self feed Compensations: Slow rate;Small  sips/bites Postural Changes: Seated upright at 90 degrees    Other  Recommendations Oral Care Recommendations: Oral care BID   Follow up Recommendations None      Frequency and Duration            Prognosis        Swallow Study   General HPI: Dawn AmericanJudy C Elkinsis a 66 y.o.femalepresenting with AMS/change in speech. Head CT no acute intracranial abnormality seen. PMH: HTN, anxiety, back pain, stenosis (spinal, lumbar).  Type of Study: Bedside Swallow Evaluation Previous Swallow Assessment:  (none) Diet Prior to this Study: NPO Temperature Spikes Noted: No Respiratory Status: Nasal cannula History of Recent Intubation: No Behavior/Cognition: Alert;Cooperative;Pleasant mood (anxious, hyperverbal) Oral Cavity Assessment: Dry Oral Care Completed by SLP: Yes Oral Cavity - Dentition: Adequate natural dentition Vision: Functional for self-feeding Self-Feeding Abilities: Able to feed self Patient Positioning: Upright in bed Baseline Vocal Quality: Normal Volitional Cough: Strong Volitional Swallow: Able to elicit    Oral/Motor/Sensory Function Overall Oral Motor/Sensory Function: Within functional limits   Ice Chips Ice chips: Not tested   Thin Liquid Thin Liquid: Within functional limits Presentation: Cup;Straw    Nectar Thick Nectar Thick Liquid: Not tested   Honey Thick Honey Thick Liquid: Not tested   Puree Puree: Within functional limits   Solid   GO   Solid: Within functional limits        Dawn Beasley, Dawn CoonsLisa Beasley 12/24/2016,10:57 AM  Dawn CoonsLisa Beasley Dawn Beasley M.Ed [employer] Pager [phone]

## 2016-12-24 NOTE — Progress Notes (Signed)
Subjective: Patient remains confused with no significant changes from yesterday. Speech continues to be pressured and tangential. Husband feels that she is slightly better. Again I tried to get a further evaluation of exactly how much Xanax she was on an when she was taking it but her history is very convoluted.  Exam: Vitals:   12/24/16 0312 12/24/16 0510  BP: 138/76 113/67  Pulse:  83  Resp:  17  Temp:  98 F (36.7 C)       Gen: In bed, NAD MS: As above CN: 2 through 12 grossly intact Motor: Moving all extremities well Sensory: Grossly intact   Pertinent Labs/Diagnostics: EEG shows no epileptiform activity  CTA of head and neck showed no abnormalities   Impression: 66 year old female with chronic abuse medication presenting with slight confusion and slurred speech. At this point still there is no clear evidence of aphasia on examination. Patient remains with pressured speech and tangential thinking. Thus far neuro workup has been nonrevealing. At this time most likely etiology of her symptoms is medication overuse or possibly withdrawal at this time. No further recommendations at this time neurology will sign off  Felicie MornDavid Smith PA-C [facility] [phone]     12/24/2016, 11:05 AM

## 2016-12-24 NOTE — Evaluation (Signed)
Occupational Therapy Evaluation Patient Details Name: Dawn Beasley MRN: [id number] DOB: 01/09/1951 Today's Date: 12/24/2016    History of Present Illness Dawn Beasley a 66 y.o.femalepresenting with AMS. PMH is significant for lumbar spinal stenosis s/p SCS placement last year, HTN, HLD, arthritis, and anxiety, ETOH and tobacco use   Clinical Impression   Pt reports she was independent with ADL PTA. Currently pt min guard for functional mobility and ADL. Pt presenting with impaired cognition, pain, impaired standing balance, decreased safety awareness, and requires consistent cues for attention to activities impacting her independence and safety with ADL and functional mobility. Pt planning to d/c home with intermittent supervision from family. Pending improvement in cognition; feel pt will be able to safely d/c home without OT follow up. Pt would benefit from continued skilled OT to address established goals.    Follow Up Recommendations  No OT follow up;Supervision/Assistance - 24 hour (24/7 S due to impaired cognition)    Equipment Recommendations  3 in 1 bedside commode (for use as shower chair)    Recommendations for Other Services       Precautions / Restrictions Precautions Precautions: Fall Restrictions Weight Bearing Restrictions: No      Mobility Bed Mobility Overal bed mobility: Needs Assistance Bed Mobility: Supine to Sit     Supine to sit: Supervision;HOB elevated Sit to supine: Min assist   General bed mobility comments: Supervision for safety; no physical assist required  Transfers Overall transfer level: Needs assistance Equipment used: None Transfers: Sit to/from Stand Sit to Stand: Min guard Stand pivot transfers: Min assist       General transfer comment: Min guard for safety; no physical assist. Cues for safety    Balance Overall balance assessment: Needs assistance Sitting-balance support: Feet supported;No upper extremity  supported Sitting balance-Leahy Scale: Good Sitting balance - Comments: unable to test further d/t decr attention   Standing balance support: No upper extremity supported;During functional activity Standing balance-Leahy Scale: Fair Standing balance comment: unable to test further at this time d/t decr attention/restlessness with pt escalating easily                            ADL Overall ADL's : Needs assistance/impaired Eating/Feeding: Set up;Sitting   Grooming: Set up;Supervision/safety;Sitting   Upper Body Bathing: Set up;Supervision/ safety;Sitting   Lower Body Bathing: Min guard;Sit to/from stand   Upper Body Dressing : Set up;Supervision/safety;Sitting Upper Body Dressing Details (indicate cue type and reason): to don hospital gown Lower Body Dressing: Min guard;Sit to/from stand Lower Body Dressing Details (indicate cue type and reason): Pt able to doff/don socks sitting EOB Toilet Transfer: Min guard;Ambulation;Regular Teacher, adult education Details (indicate cue type and reason): Simulated by sit to stand from EOB with functional mobility in room Toileting- Clothing Manipulation and Hygiene: Min guard;Sit to/from stand       Functional mobility during ADLs: Min guard General ADL Comments: Requires cues for attention to task. SpO2 86% on RA following activity, reapplied 2L supplemental O2 with rise in SpO2 to mid 90s.      Vision     Perception     Praxis      Pertinent Vitals/Pain Pain Assessment: Faces Faces Pain Scale: Hurts little more Pain Location: back Pain Descriptors / Indicators: Grimacing;Sore Pain Intervention(s): Monitored during session;Repositioned     Hand Dominance     Extremity/Trunk Assessment Upper Extremity Assessment Upper Extremity Assessment: Overall WFL for tasks assessed  Lower Extremity Assessment Lower Extremity Assessment: Defer to PT evaluation   Cervical / Trunk Assessment Cervical / Trunk Assessment:  Normal   Communication Communication Communication: No difficulties   Cognition Arousal/Alertness: Awake/alert Behavior During Therapy: Restless Overall Cognitive Status: Impaired/Different from baseline Area of Impairment: Memory;Following commands;Safety/judgement;Awareness;Problem solving;Attention Orientation Level: Disoriented to;Time;Situation Current Attention Level: Focused Memory: Decreased short-term memory Following Commands: Follows one step commands consistently Safety/Judgement: Decreased awareness of safety;Decreased awareness of deficits Awareness: Intellectual Problem Solving: Requires verbal cues;Difficulty sequencing General Comments: Requires consistent redirection and cues to maintain attention to tasks.   General Comments       Exercises       Shoulder Instructions      Home Living Family/patient expects to be discharged to:: Private residence Living Arrangements: Spouse/significant other Available Help at Discharge: Family;Available PRN/intermittently Type of Home: House Home Access: Stairs to enter Entrance Stairs-Number of Steps: 2   Home Layout: Two level;Able to live on main level with bedroom/bathroom Alternate Level Stairs-Number of Steps: flight   Bathroom Shower/Tub: Tub/shower unit Shower/tub characteristics: Engineer, building servicesCurtain Bathroom Toilet: Standard     Home Equipment: Cane - single point          Prior Functioning/Environment Level of Independence: Independent        Comments: does occasionally sit in bottom of tub for baths due to back pain        OT Problem List: Decreased activity tolerance;Impaired balance (sitting and/or standing);Decreased cognition;Decreased safety awareness;Pain   OT Treatment/Interventions: Self-care/ADL training;Energy conservation;DME and/or AE instruction;Therapeutic activities;Patient/family education;Balance training    OT Goals(Current goals can be found in the care plan section) Acute Rehab OT  Goals Patient Stated Goal: to go home OT Goal Formulation: With patient/family Time For Goal Achievement: 01/07/17 Potential to Achieve Goals: Good ADL Goals Pt Will Perform Grooming: standing;with modified independence Pt Will Perform Upper Body Bathing: sitting;with modified independence Pt Will Perform Lower Body Bathing: sit to/from stand;with modified independence Pt Will Perform Upper Body Dressing: sitting;with modified independence Pt Will Perform Lower Body Dressing: sit to/from stand;with modified independence Pt Will Transfer to Toilet: with modified independence;ambulating;regular height toilet Pt Will Perform Toileting - Clothing Manipulation and hygiene: with modified independence;sit to/from stand Pt Will Perform Tub/Shower Transfer: Tub transfer;with supervision;ambulating;3 in 1  OT Frequency: Min 2X/week   Barriers to D/C: Decreased caregiver support  husband works during the day       Co-evaluation              End of Session Equipment Utilized During Treatment: Gait belt;Oxygen Nurse Communication: Mobility status  Activity Tolerance: Patient tolerated treatment well Patient left: in chair;with call bell/phone within reach;with chair alarm set;with family/visitor present   Time: [id number] OT Time Calculation (min): 18 min Charges:  OT General Charges $OT Visit: 1 Procedure OT Evaluation $OT Eval Moderate Complexity: 1 Procedure G-Codes:     Gaye AlkenBailey A Liridona Mashaw M.S., OTR/L Pager: [phone]: [phone]  12/24/2016, 2:06 PM

## 2016-12-24 NOTE — Progress Notes (Signed)
Was alerted by patient's nurse earlier this evening that she was extremely agitated, talking non-stop with nonsensical speech, and had been trying to get out of bed. She was seated at Nurses' station for extra supervision for a while. 1 mg of ativan IV was given in addition to the previous 0.5 mg x 2 scheduled. This helped a little, but patient still hyper-mobile and trying to get out of bed. BP, HR elevated in setting of agitation. Needing O2 to maintain O2 sats but difficult to get accurate SpO2. Patient appears stiff at times but no rhythmic or coordinated motions, more like agitated thrashing. Ordered nicotine patch, as patient smokes about a half a pack a day. Spoke with Dr. Isaiah SergeMannam on e-Link for suggestions on increasing ativan vs trying valium. Will try 5 mg IV valium now and repeat in ~ 4 hours if needed. Continue prn ativan per [facility] protocol. Would like to restart pt's home robaxin when cleared by Speech.   Dani GobbleHillary Najir Roop, MD [facility], PGY-2

## 2016-12-24 NOTE — Evaluation (Signed)
Physical Therapy Evaluation Patient Details Name: Dawn Beasley MRN: [id number] DOB: 1951-10-24 Today's Date: 12/24/2016   History of Present Illness  Dawn Beasley a 66 y.o.femalepresenting with AMS. PMH is significant for lumbar spinal stenosis s/p SCS placement last year, HTN, HLD, arthritis, and anxiety, ETOH and tobacco use  Clinical Impression  Pt admitted with above diagnosis. Pt currently with functional limitations due to the deficits listed below (see PT Problem List).  Pt will benefit from skilled PT to increase their independence and safety with mobility to allow discharge to the venue listed below.  eval limited d/t cognition, feel pt will progress well from mobility standpoint once cognition improves; will continue to follow      Follow Up Recommendations No PT follow up (likely will not need f/u if cognition improves ?[facility] post acute)    Equipment Recommendations  Other (comment) (TBD)    Recommendations for Other Services       Precautions / Restrictions Precautions Precautions: Fall Restrictions Weight Bearing Restrictions: No      Mobility  Bed Mobility Overal bed mobility: Needs Assistance Bed Mobility: Supine to Sit;Sit to Supine     Supine to sit: Min assist Sit to supine: Min assist   General bed mobility comments: multi-modal cues to attend to task and facilitation of transition to and from sitting position  Transfers Overall transfer level: Needs assistance Equipment used: None Transfers: Sit to/from [employer] Sit to Stand: Min assist Stand pivot transfers: Min assist       General transfer comment: multi-modal cues to attend to task and facilitation of transition to standing and to pivot to BSC; HR 106 at rest, up to 120 with transfer  Ambulation/Gait                Stairs            Wheelchair Mobility    Modified Rankin (Stroke Patients Only)       Balance Overall balance assessment: Needs  assistance (husband denies falls at home)   Sitting balance-Leahy Scale: Fair Sitting balance - Comments: unable to test further d/t decr attention   Standing balance support: Single extremity supported Standing balance-Leahy Scale: Fair Standing balance comment: unable to test further at this time d/t decr attention/restlessness with pt escalating easily                             Pertinent Vitals/Pain Pain Assessment: Faces Faces Pain Scale: No hurt Pain Intervention(s): Monitored during session    Home Living Family/patient expects to be discharged to:: Private residence Living Arrangements: Spouse/significant other Available Help at Discharge: Available PRN/intermittently Type of Home: House Home Access: Stairs to enter   [employer] of Steps: 2 Home Layout: Two level;Able to live on main level with bedroom/bathroom Home Equipment: [employer] - single point      Prior Function Level of Independence: Independent               Hand Dominance        Extremity/Trunk Assessment   Upper Extremity Assessment Upper Extremity Assessment: Overall WFL for tasks assessed    Lower Extremity Assessment Lower Extremity Assessment: Overall WFL for tasks assessed       Communication   Communication: No difficulties  Cognition Arousal/Alertness: Awake/alert Behavior During Therapy: Restless Overall Cognitive Status: Impaired/Different from baseline Area of Impairment: Orientation;Attention;Problem solving;Following commands Orientation Level: Disoriented to;Time;Situation Current Attention Level: Focused   Following  Commands: Follows one step commands inconsistently     Problem Solving: Requires verbal cues;Difficulty sequencing General Comments: pt talking excessively once awake, requires constant redirection to simple task d/t internal distractions    General Comments      Exercises     Assessment/Plan    PT Assessment Patient needs  continued PT services  PT Problem List Decreased activity tolerance;Decreased balance;Decreased cognition;Decreased coordination;Decreased safety awareness          PT Treatment Interventions DME instruction;Gait training;Functional mobility training;Therapeutic exercise;Patient/family education;Balance training    PT Goals (Current goals can be found in the Care Plan section)  Acute Rehab PT Goals Patient Stated Goal: pt unable to state PT Goal Formulation: With patient/family Time For Goal Achievement: 01/07/17 Potential to Achieve Goals: Good    Frequency Min 2X/week   Barriers to discharge        Co-evaluation               End of Session Equipment Utilized During Treatment: Gait belt Activity Tolerance: Patient tolerated treatment well;Other (comment) (limited by cognition) Patient left: in bed;with call bell/phone within reach;with family/visitor present;with bed alarm set Nurse Communication: Mobility status;Other (comment) (pt mental status/RN and DO made aware)         Time: [id number] PT Time Calculation (min) (ACUTE ONLY): 27 min   Charges:   PT Evaluation $PT Eval Moderate Complexity: 1 Procedure PT Treatments $Therapeutic Activity: 8-22 mins   PT G Codes:        Leyla Soliz 12/24/2016, 10:23 AM

## 2016-12-24 NOTE — Progress Notes (Signed)
Family Medicine Teaching Service Daily Progress Note Intern Pager: [phone]  Patient name: Dawn Beasley Medical record number: [id number] Date of birth: 1951/11/12 Age: 66 y.o. Gender: female  Primary Care Provider: Lonie Peak, PA-C Consultants: neurology Code Status: full  Pt Overview and Major Events to Date:  2/6- admitted to FPTS with abnormal speech  Assessment and Plan: Dawn Beasley is a 66 y.o. female presenting with change in speech. PMH is significant for lumbar spinal stenosis s/p SCS placement last year, HTN, HLD, arthritis, and anxiety.   #AMS/speech change: Patient unable to verbalize normally, speaking "word salad" but not aphasic as able to name objects on Neuro exam. Can answer questions appropriately. CT head without any acute abnormalities. Cannot obtain MRI brain due to SCS. No focal weakness on exam but had difficulty walking around house prior to presentation. Tachycardic to 124 with dilated and sluggish pupils on exam, consistent with benzo withdrawal. UDS positive for opiates and benzos. Mild leukocytosis to 10.9. Differential includes benzo withdrawal vs neurologic event vs wernicke's encephalopathy vs psychogenic etiology. - Neurology following, appreciate recommendations - EEG with background slowing - IV ativan 0.5 mg q6h scheduled, s/p 5mg  valium overnight for agitation - addition 5 mg valium this morning for agitation - CIWA protocol ordered for alcohol history- overnight 15 and 29. Scored 7 for agitation. - Swallow eval pending - UA trace leukocytes, ngeative nitrites, 6-30 squams -consult to psych  -start risperdol 1mg  daily -haldol 0.5 mg as needed for agitation - PT/OT evaluation  #Hyponatremia- resolved: 128 on admission. Baseline around 140 (last known 11/29/15). Osmolality mildly low at 271. Husband says she drinks a lot of water. Also taking HCTZ as on of her medications. Appears slightly dehydrated on exam, so could consider hypovolemia.  Sodium 138 this morning.  - Hold maxide.  - TSH normal - Recheck urine sodium in a.m. after MIVFs overnight  #Chronic pain: on 90 morphine equivalents of percocet at home. - continue Morphine 7 mg IV q4h - Restart gabapentin as able - Restart robaxin as able - Switch to PO once passes swallow eval  #HTN- stable: BP 113/67 - Holding home norvasc 10 mg daily, metoprolol 25 mg daily, and triamterene-HCTZ 37.5-25 mg (1.5 tabs) daily while NPO - Continue 5 mg IV metoprolol prn  #Anxiety: - Holding xanax and vistaril - Restart buspar 7.5 mg BID when taking PO - Ativan as above  #HLD- stable - Continue home pravastatin 40 mg as able  FEN/GI: NPO until swallow eval, NS 100 mL/hr Prophylaxis: Lovenox SQ  Disposition: pending clinical improvement  Subjective:  [name] is able to state her name, where she is, what year it is. She denies pain. Talks nonsensically when not answering questions. Per husband she has improved some since admission.  Objective: Temp:  [98 F (36.7 C)-99.4 F (37.4 C)] 98 F (36.7 C) (02/07 0510) Pulse Rate:  [83-120] 83 (02/07 0510) Resp:  [17-27] 17 (02/07 0510) BP: (113-170)/(61-146) 113/67 (02/07 0510) SpO2:  [76 %-99 %] 97 % (02/07 0510) Weight:  [133 lb 3.2 oz (60.4 kg)] 133 lb 3.2 oz (60.4 kg) (02/06 1832) Physical Exam: General: middle aged lady laying in bed in NAD, Vivian in place Cardiovascular: RRR no MRG Respiratory: no increased WOB CTAB Abdomen: soft, non-tender, non-distended, +BS Extremities: no edema or cyanosis  Laboratory:  Recent Labs Lab 12/23/16 0926 12/24/16 0721  WBC 10.9* 9.6  HGB 17.1* 14.4  HCT 47.6* 43.2  PLT 277 263    Recent Labs Lab 12/23/16  9604 12/23/16 1929 12/24/16 0721  NA 128* 131* 138  K 3.1* 2.7* 3.8  CL 82* 93* 98*  CO2 26 23 29   BUN 12 7 14   CREATININE 0.86 0.76 0.63  CALCIUM 9.6 8.5* 8.5*  PROT 7.1  --   --   BILITOT 1.4*  --   --   ALKPHOS 53  --   --   ALT 23  --   --   AST 33   --   --   GLUCOSE 91 78 90   TSH 0.588  Imaging/Diagnostic Tests: Ct Angio Head W Or Wo Contrast  Result Date: 12/23/2016 CLINICAL DATA:  Altered mental status and pressured speech. EXAM: CT ANGIOGRAPHY HEAD AND NECK TECHNIQUE: Multidetector CT imaging of the head and neck was performed using the standard protocol during bolus administration of intravenous contrast. Multiplanar CT image reconstructions and MIPs were obtained to evaluate the vascular anatomy. Carotid stenosis measurements (when applicable) are obtained utilizing NASCET criteria, using the distal internal carotid diameter as the denominator. CONTRAST:  50 mL Isovue 370 COMPARISON:  Noncontrast head CT earlier today FINDINGS: CT HEAD FINDINGS Brain: There is no evidence of acute cortical infarct, intracranial hemorrhage, mass, midline shift, or extra-axial fluid collection. Ventricles and sulci are normal for age. Vascular: Mild calcified atherosclerosis at the skullbase. No hyperdense vessel. Skull: No fracture or focal osseous lesion. Sinuses: Prior internal fixation of maxillary sinus fractures. No significant inflammatory sinus disease. Minimal right mastoid air cell opacification. Orbits: Unremarkable. Review of the MIP images confirms the above findings CTA NECK FINDINGS Aortic arch: 3 vessel aortic arch with mild atherosclerotic plaque. Brachiocephalic and subclavian arteries are widely patent. Right carotid system: Mild calcified and soft plaque in the proximal ICA without significant stenosis. No evidence of dissection. Left carotid system: Minimal calcified and soft plaque at the carotid bifurcation and in the proximal ICA without stenosis. No evidence of dissection. Vertebral arteries: Patent and codominant without evidence of stenosis or dissection. Skeleton: Mild cervical disc degeneration, predominantly C4-C7. Slight retrolisthesis of C3 on C4. Other neck: No mass or lymph node enlargement. Upper chest: Mild centrilobular  emphysema. Review of the MIP images confirms the above findings CTA HEAD FINDINGS Anterior circulation: Internal carotid arteries are patent from skullbase to carotid termini. There is mild carotid siphon atherosclerosis bilaterally without significant stenosis. ACAs and MCAs are patent without evidence of significant A1 or M1 stenosis. There is prominent ACA and MCA branch vessel atherosclerotic type irregularity bilaterally with focal regions of up to severe stenosis. No aneurysm. Posterior circulation: Intracranial vertebral arteries are widely patent to the basilar. PICA, AICA, and SCA origins are patent. Basilar artery is widely patent. Posterior communicating arteries are not identified. PCAs are patent with mild atherosclerotic type irregularity bilaterally as well as mild right greater than left P2 segment narrowing. No aneurysm. Venous sinuses: Patent. Dominant right transverse and right sigmoid sinuses. Anatomic variants: None. Delayed phase: No abnormal enhancement. Review of the MIP images confirms the above findings IMPRESSION: 1. No large vessel occlusion. 2. Prominent intracranial branch vessel atherosclerosis. No significant large vessel stenosis. 3. Mild cervical carotid atherosclerosis without stenosis. Electronically Signed   By: Sebastian Ache M.D.   On: 12/23/2016 15:35   Ct Head Wo Contrast  Result Date: 12/23/2016 CLINICAL DATA:  Altered mental status. EXAM: CT HEAD WITHOUT CONTRAST TECHNIQUE: Contiguous axial images were obtained from the base of the skull through the vertex without intravenous contrast. COMPARISON:  None. FINDINGS: Brain: No evidence of acute infarction,  hemorrhage, hydrocephalus, extra-axial collection or mass lesion/mass effect. Vascular: No hyperdense vessel or unexpected calcification. Skull: Normal. Negative for fracture or focal lesion. Sinuses/Orbits: Right maxillary mucous retention cyst is noted. Other: None. IMPRESSION: No acute intracranial abnormality seen.  Electronically Signed   By: Lupita RaiderJames  Green Jr, M.D.   On: 12/23/2016 10:42   Ct Angio Neck W Or Wo Contrast  Result Date: 12/23/2016 CLINICAL DATA:  Altered mental status and pressured speech. EXAM: CT ANGIOGRAPHY HEAD AND NECK TECHNIQUE: Multidetector CT imaging of the head and neck was performed using the standard protocol during bolus administration of intravenous contrast. Multiplanar CT image reconstructions and MIPs were obtained to evaluate the vascular anatomy. Carotid stenosis measurements (when applicable) are obtained utilizing NASCET criteria, using the distal internal carotid diameter as the denominator. CONTRAST:  50 mL Isovue 370 COMPARISON:  Noncontrast head CT earlier today FINDINGS: CT HEAD FINDINGS Brain: There is no evidence of acute cortical infarct, intracranial hemorrhage, mass, midline shift, or extra-axial fluid collection. Ventricles and sulci are normal for age. Vascular: Mild calcified atherosclerosis at the skullbase. No hyperdense vessel. Skull: No fracture or focal osseous lesion. Sinuses: Prior internal fixation of maxillary sinus fractures. No significant inflammatory sinus disease. Minimal right mastoid air cell opacification. Orbits: Unremarkable. Review of the MIP images confirms the above findings CTA NECK FINDINGS Aortic arch: 3 vessel aortic arch with mild atherosclerotic plaque. Brachiocephalic and subclavian arteries are widely patent. Right carotid system: Mild calcified and soft plaque in the proximal ICA without significant stenosis. No evidence of dissection. Left carotid system: Minimal calcified and soft plaque at the carotid bifurcation and in the proximal ICA without stenosis. No evidence of dissection. Vertebral arteries: Patent and codominant without evidence of stenosis or dissection. Skeleton: Mild cervical disc degeneration, predominantly C4-C7. Slight retrolisthesis of C3 on C4. Other neck: No mass or lymph node enlargement. Upper chest: Mild centrilobular  emphysema. Review of the MIP images confirms the above findings CTA HEAD FINDINGS Anterior circulation: Internal carotid arteries are patent from skullbase to carotid termini. There is mild carotid siphon atherosclerosis bilaterally without significant stenosis. ACAs and MCAs are patent without evidence of significant A1 or M1 stenosis. There is prominent ACA and MCA branch vessel atherosclerotic type irregularity bilaterally with focal regions of up to severe stenosis. No aneurysm. Posterior circulation: Intracranial vertebral arteries are widely patent to the basilar. PICA, AICA, and SCA origins are patent. Basilar artery is widely patent. Posterior communicating arteries are not identified. PCAs are patent with mild atherosclerotic type irregularity bilaterally as well as mild right greater than left P2 segment narrowing. No aneurysm. Venous sinuses: Patent. Dominant right transverse and right sigmoid sinuses. Anatomic variants: None. Delayed phase: No abnormal enhancement. Review of the MIP images confirms the above findings IMPRESSION: 1. No large vessel occlusion. 2. Prominent intracranial branch vessel atherosclerosis. No significant large vessel stenosis. 3. Mild cervical carotid atherosclerosis without stenosis. Electronically Signed   By: Sebastian AcheAllen  Grady M.D.   On: 12/23/2016 15:35     Tillman SersAngela C Jd Mccaster, DO 12/24/2016, 8:21 AM PGY-1, [facility] FPTS Intern pager: [phone], text pages welcome

## 2016-12-24 NOTE — Consult Note (Signed)
[facility] Consult   Reason for Consult:  Manic behaviors and questionable withdrawal of medication Referring Physician:  Dr. Wendy Poet Patient Identification: Dawn Beasley MRN:  [id number] Principal Diagnosis: Encephalopathy acute Diagnosis:   Patient Active Problem List   Diagnosis Date Noted  . 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 Encephalopathy acute [G93.40] 12/23/2016  . Altered mental status [R41.82] 12/23/2016  . Acute hyponatremia [E87.1]   . Speech disturbance [R47.9]   . Benzodiazepine withdrawal with complication ([place]) [N82.956]   . Other chronic pain [G89.29] 12/06/2015  . Lumbar spinal stenosis [M48.061] 04/12/2014    Total Time spent with patient: 1 hour  Subjective:   Dawn Beasley is a 67 y.o. female patient admitted with tangential and racing thoughts.  HPI:  Dawn Beasley is a 66 y.o. female, seen, chart reviewed and spoke with her sister and daughter who was in her with patient consent. Patient appeared sitting in a chair next to her bed. Patient seen for this face to face psychiatric consultation and evaluation of increased or manic behaviors. Patient has no previous history of mental illness or treatment needs. Patient reportedly suffering with chronic back pain and had at least 3 different times surgeries. Patient has been seeing pain management and also primary care physician who has been prescribing her pain medication. Patient reportedly does not want take pain medication but she has been taking officially 4 times a day and reportedly taking extra medication from her husband to control her pain. Patient complains her PCP or pain medication management doctors does not want her bleeding off her medication because medication making her more drowsy, tired, forgetful, losing time periods, does not know when she shopped or not, does not know what happened the meat in her house, not able to sleep due to ongoing discomfort secondary to back pain.  Patient reported she did not sleep about 2 weeks before coming to the hospital. Patient is sleep deprived, presented with racing thoughts, tangential thoughts and sometimes unable to follow her speech because of babbling according to her husband. Patient primary care physician started BuSpar 7.5 mg twice daily thinking she has anxiety. Patient is willing to change her medication to calm down and also help with her sleep and pain. We will start Seroquel 25 mg 3 times daily to control the mood and anxiety and also will increase her gabapentin 600 mg 3 times daily. Patient reported her previous provider given up to 3200 mg of gabapentin daily. Patient denied suicidal/homicidal ideation, paranoid delusions and hallucinations. Reportedly patient has been stressed about her dog being died recently. Patient reportedly taking Xanax and Klonopin stool and she drinks about 3 glasses of wine a night but not drinking since Thursday. She has been made come to the hospital after hours.  Past Psychiatric History: No history of mental illness and treatment  Risk to Self: Is patient at risk for suicide?: No Risk to Others:   Prior Inpatient Therapy:   Prior Outpatient Therapy:    Past Medical History:  Past Medical History:  Diagnosis Date  . Anxiety   . Arthritis   . Back pain   . 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 Hypertension   . Stenosis, spinal, lumbar     Past Surgical History:  Procedure Laterality Date  . APPENDECTOMY    . BACK SURGERY    . CARPAL TUNNEL RELEASE    . LUMBAR LAMINECTOMY/DECOMPRESSION MICRODISCECTOMY N/A 04/12/2014   Procedure: MICRO LUMBAR DECOMPRESSION LUMBAR THREE TO Theresa Mulligan  FOUR TO FIVE;  Surgeon: Johnn Hai, MD;  Location: [facility];  Service: Orthopedics;  Laterality: N/A;  . MAXOFACIAL SURGERY    . SPINAL CORD STIMULATOR INSERTION N/A 12/06/2015   Procedure: LUMBAR SPINAL CORD STIMULATOR INSERTION;  Surgeon: Melina Schools, MD;  Location: [facility];  Service: Orthopedics;   Laterality: N/A;   Family History: History reviewed. No pertinent family history. Family Psychiatric  History: Not significant for mental illness. Social History:  History  Alcohol Use  . Yes    Comment: OCCASIONAL     History  Drug Use No    Social History   Social History  . Marital status: Married    Spouse name: N/A  . Number of children: N/A  . Years of education: N/A   Social History Main Topics  . Smoking status: Current Every Day Smoker    Packs/day: 0.50    Years: 40.00  . Smokeless tobacco: None  . Alcohol use Yes     Comment: OCCASIONAL  . Drug use: No  . Sexual activity: Not Asked   Other Topics Concern  . None   Social History Narrative  . None   Additional Social History:    Allergies:   Allergies  Allergen Reactions  . Celebrex [Celecoxib] Swelling    Throat swelling  . Sulfa Antibiotics Swelling    Throat swelling    Labs:  Results for orders placed or performed during the hospital encounter of 12/23/16 (from the past 48 hour(s))  Comprehensive metabolic panel     Status: Abnormal   Collection Time: 12/23/16  9:26 AM  Result Value Ref Range   Sodium 128 (L) 135 - 145 mmol/L   Potassium 3.1 (L) 3.5 - 5.1 mmol/L   Chloride 82 (L) 101 - 111 mmol/L   CO2 26 22 - 32 mmol/L   Glucose, Bld 91 65 - 99 mg/dL   BUN 12 6 - 20 mg/dL   Creatinine, Ser 0.86 0.44 - 1.00 mg/dL   Calcium 9.6 8.9 - 10.3 mg/dL   Total Protein 7.1 6.5 - 8.1 g/dL   Albumin 4.1 3.5 - 5.0 g/dL   AST 33 15 - 41 U/L   ALT 23 14 - 54 U/L   Alkaline Phosphatase 53 38 - 126 U/L   Total Bilirubin 1.4 (H) 0.3 - 1.2 mg/dL   GFR calc non Af Amer >60 >60 mL/min   GFR calc Af Amer >60 >60 mL/min    Comment: (NOTE) The eGFR has been calculated using the CKD EPI equation. This calculation has not been validated in all clinical situations. eGFR's persistently <60 mL/min signify possible Chronic Kidney Disease.    Anion gap 20 (H) 5 - 15  CBC     Status: Abnormal   Collection  Time: 12/23/16  9:26 AM  Result Value Ref Range   WBC 10.9 (H) 4.0 - 10.5 K/uL   RBC 4.67 3.87 - 5.11 MIL/uL   Hemoglobin 17.1 (H) 12.0 - 15.0 g/dL   HCT 47.6 (H) 36.0 - 46.0 %   MCV 101.9 (H) 78.0 - 100.0 fL   MCH 36.6 (H) 26.0 - 34.0 pg   MCHC 35.9 30.0 - 36.0 g/dL   RDW 13.1 11.5 - 15.5 %   Platelets 277 150 - 400 K/uL  Ammonia     Status: None   Collection Time: 12/23/16  9:55 AM  Result Value Ref Range   Ammonia 23 9 - 35 umol/L  I-Stat Venous Blood Gas, ED (order at [facility]  and [facility] only)     Status: Abnormal   Collection Time: 12/23/16  9:55 AM  Result Value Ref Range   pH, Ven 7.516 (H) 7.250 - 7.430   pCO2, Ven 39.7 (L) 44.0 - 60.0 mmHg   pO2, Ven 33.0 32.0 - 45.0 mmHg   Bicarbonate 32.1 (H) 20.0 - 28.0 mmol/L   TCO2 33 0 - 100 mmol/L   O2 Saturation 71.0 %   Acid-Base Excess 8.0 (H) 0.0 - 2.0 mmol/L   Patient temperature HIDE    Sample type VENOUS    Comment NOTIFIED PHYSICIAN   Acetaminophen level     Status: Abnormal   Collection Time: 12/23/16  9:55 AM  Result Value Ref Range   Acetaminophen (Tylenol), Serum <10 (L) 10 - 30 ug/mL    Comment:        THERAPEUTIC CONCENTRATIONS VARY SIGNIFICANTLY. A RANGE OF 10-30 ug/mL MAY BE AN EFFECTIVE CONCENTRATION FOR MANY PATIENTS. HOWEVER, SOME ARE BEST TREATED AT CONCENTRATIONS OUTSIDE THIS RANGE. ACETAMINOPHEN CONCENTRATIONS >150 ug/mL AT 4 HOURS AFTER INGESTION AND >50 ug/mL AT 12 HOURS AFTER INGESTION ARE OFTEN ASSOCIATED WITH TOXIC REACTIONS.   Osmolality     Status: Abnormal   Collection Time: 12/23/16  9:55 AM  Result Value Ref Range   Osmolality 271 (L) 275 - 295 mOsm/kg  Osmolality, urine     Status: None   Collection Time: 12/23/16  9:55 AM  Result Value Ref Range   Osmolality, Ur 443 300 - 900 mOsm/kg  CBG monitoring, ED     Status: None   Collection Time: 12/23/16 10:12 AM  Result Value Ref Range   Glucose-Capillary 92 65 - 99 mg/dL   Comment 1 Notify RN    Comment 2 Document in Chart   Urinalysis,  Routine w reflex microscopic     Status: Abnormal   Collection Time: 12/23/16 10:47 AM  Result Value Ref Range   Color, Urine YELLOW YELLOW   APPearance CLOUDY (A) CLEAR   Specific Gravity, Urine 1.014 1.005 - 1.030   pH 6.0 5.0 - 8.0   Glucose, UA NEGATIVE NEGATIVE mg/dL   Hgb urine dipstick SMALL (A) NEGATIVE   Bilirubin Urine NEGATIVE NEGATIVE   Ketones, ur 80 (A) NEGATIVE mg/dL   Protein, ur 100 (A) NEGATIVE mg/dL   Nitrite NEGATIVE NEGATIVE   Leukocytes, UA MODERATE (A) NEGATIVE   RBC / HPF 0-5 0 - 5 RBC/hpf   WBC, UA TOO NUMEROUS TO COUNT 0 - 5 WBC/hpf   Bacteria, UA FEW (A) NONE SEEN   Squamous Epithelial / LPF TOO NUMEROUS TO COUNT (A) NONE SEEN  Rapid urine drug screen (hospital performed)     Status: Abnormal   Collection Time: 12/23/16 10:47 AM  Result Value Ref Range   Opiates POSITIVE (A) NONE DETECTED   Cocaine NONE DETECTED NONE DETECTED   Benzodiazepines POSITIVE (A) NONE DETECTED   Amphetamines NONE DETECTED NONE DETECTED   Tetrahydrocannabinol NONE DETECTED NONE DETECTED   Barbiturates NONE DETECTED NONE DETECTED    Comment:        DRUG SCREEN FOR MEDICAL PURPOSES ONLY.  IF CONFIRMATION IS NEEDED FOR ANY PURPOSE, NOTIFY LAB WITHIN 5 DAYS.        LOWEST DETECTABLE LIMITS FOR URINE DRUG SCREEN Drug Class       Cutoff (ng/mL) Amphetamine      1000 Barbiturate      200 Benzodiazepine   482 Tricyclics       500 Opiates  300 Cocaine          300 THC              50   Urine culture     Status: None   Collection Time: 12/23/16 10:47 AM  Result Value Ref Range   Specimen Description URINE, CATHETERIZED    Special Requests NONE    Culture NO GROWTH    Report Status 12/24/2016 FINAL   Sodium, urine, random     Status: None   Collection Time: 12/23/16 10:47 AM  Result Value Ref Range   Sodium, Ur <10 mmol/L    Comment: REPEATED TO VERIFY  I-Stat Troponin, ED (not at [facility])     Status: None   Collection Time: 12/23/16 12:54 PM  Result Value Ref  Range   Troponin i, poc 0.02 0.00 - 0.08 ng/mL   Comment 3            Comment: Due to the release kinetics of cTnI, a negative result within the first hours of the onset of symptoms does not rule out myocardial infarction with certainty. If myocardial infarction is still suspected, repeat the test at appropriate intervals.   Ethanol     Status: None   Collection Time: 12/23/16  7:29 PM  Result Value Ref Range   Alcohol, Ethyl (B) <5 <5 mg/dL    Comment:        LOWEST DETECTABLE LIMIT FOR SERUM ALCOHOL IS 5 mg/dL FOR MEDICAL PURPOSES ONLY   Basic metabolic panel     Status: Abnormal   Collection Time: 12/23/16  7:29 PM  Result Value Ref Range   Sodium 131 (L) 135 - 145 mmol/L   Potassium 2.7 (LL) 3.5 - 5.1 mmol/L    Comment: REPEATED TO VERIFY CRITICAL RESULT CALLED TO, READ BACK BY AND VERIFIED WITH: R GININWA,RN 2031 12/23/2016 WBOND    Chloride 93 (L) 101 - 111 mmol/L   CO2 23 22 - 32 mmol/L   Glucose, Bld 78 65 - 99 mg/dL   BUN 7 6 - 20 mg/dL   Creatinine, Ser 0.76 0.44 - 1.00 mg/dL   Calcium 8.5 (L) 8.9 - 10.3 mg/dL   GFR calc non Af Amer >60 >60 mL/min   GFR calc Af Amer >60 >60 mL/min    Comment: (NOTE) The eGFR has been calculated using the CKD EPI equation. This calculation has not been validated in all clinical situations. eGFR's persistently <60 mL/min signify possible Chronic Kidney Disease.    Anion gap 15 5 - 15  Urinalysis, Routine w reflex microscopic     Status: Abnormal   Collection Time: 12/23/16  9:08 PM  Result Value Ref Range   Color, Urine YELLOW YELLOW   APPearance HAZY (A) CLEAR   Specific Gravity, Urine 1.029 1.005 - 1.030   pH 7.0 5.0 - 8.0   Glucose, UA NEGATIVE NEGATIVE mg/dL   Hgb urine dipstick NEGATIVE NEGATIVE   Bilirubin Urine NEGATIVE NEGATIVE   Ketones, ur 80 (A) NEGATIVE mg/dL   Protein, ur NEGATIVE NEGATIVE mg/dL   Nitrite NEGATIVE NEGATIVE   Leukocytes, UA TRACE (A) NEGATIVE   RBC / HPF 0-5 0 - 5 RBC/hpf   WBC, UA 0-5 0 -  5 WBC/hpf   Bacteria, UA NONE SEEN NONE SEEN   Squamous Epithelial / LPF 6-30 (A) NONE SEEN   Mucous PRESENT   Sodium, urine, random     Status: None   Collection Time: 12/23/16  9:08 PM  Result Value Ref Range  Sodium, Ur 34 mmol/L  Basic metabolic panel     Status: Abnormal   Collection Time: 12/24/16  7:21 AM  Result Value Ref Range   Sodium 138 135 - 145 mmol/L    Comment: DELTA CHECK NOTED NO VISIBLE HEMOLYSIS    Potassium 3.8 3.5 - 5.1 mmol/L    Comment: DELTA CHECK NOTED NO VISIBLE HEMOLYSIS    Chloride 98 (L) 101 - 111 mmol/L   CO2 29 22 - 32 mmol/L   Glucose, Bld 90 65 - 99 mg/dL   BUN 14 6 - 20 mg/dL   Creatinine, Ser 0.63 0.44 - 1.00 mg/dL   Calcium 8.5 (L) 8.9 - 10.3 mg/dL   GFR calc non Af Amer >60 >60 mL/min   GFR calc Af Amer >60 >60 mL/min    Comment: (NOTE) The eGFR has been calculated using the CKD EPI equation. This calculation has not been validated in all clinical situations. eGFR's persistently <60 mL/min signify possible Chronic Kidney Disease.    Anion gap 11 5 - 15  CBC     Status: Abnormal   Collection Time: 12/24/16  7:21 AM  Result Value Ref Range   WBC 9.6 4.0 - 10.5 K/uL   RBC 4.05 3.87 - 5.11 MIL/uL   Hemoglobin 14.4 12.0 - 15.0 g/dL   HCT 43.2 36.0 - 46.0 %   MCV 106.7 (H) 78.0 - 100.0 fL   MCH 35.6 (H) 26.0 - 34.0 pg   MCHC 33.3 30.0 - 36.0 g/dL   RDW 13.4 11.5 - 15.5 %   Platelets 263 150 - 400 K/uL  TSH     Status: None   Collection Time: 12/24/16  7:21 AM  Result Value Ref Range   TSH 0.558 0.350 - 4.500 uIU/mL    Comment: Performed by a 3rd Generation assay with a functional sensitivity of <=0.01 uIU/mL.  Vitamin B12     Status: None   Collection Time: 12/24/16  2:54 PM  Result Value Ref Range   Vitamin B-12 256 180 - 914 pg/mL    Comment: (NOTE) This assay is not validated for testing neonatal or myeloproliferative syndrome specimens for Vitamin B12 levels.   Reticulocytes     Status: None   Collection Time: 12/24/16   2:54 PM  Result Value Ref Range   Retic Ct Pct 1.1 0.4 - 3.1 %   RBC. 4.18 3.87 - 5.11 MIL/uL   Retic Count, Manual 46.0 19.0 - 186.0 K/uL    Current Facility-Administered Medications  Medication Dose Route Frequency Provider Last Rate Last Dose  . 0.9 %  sodium chloride infusion   Intravenous Continuous Rogue Bussing, MD 100 mL/hr at 12/24/16 1722    . amLODipine (NORVASC) tablet 10 mg  10 mg Oral Daily Steve Rattler, DO   10 mg at 12/24/16 1329  . enoxaparin (LOVENOX) injection 40 mg  40 mg Subcutaneous Q24H Rogue Bussing, MD   40 mg at 12/23/16 2307  . folic acid (FOLVITE) tablet 1 mg  1 mg Oral Daily Rogue Bussing, MD   1 mg at 12/24/16 1318  . gabapentin (NEURONTIN) tablet 600 mg  600 mg Oral TID Ambrose Finland, MD      . haloperidol lactate (HALDOL) injection 0.5 mg  0.5 mg Intravenous Q6H PRN Everrett Coombe, MD      . LORazepam (ATIVAN) injection 0.5 mg  0.5 mg Intravenous Q6H Everrett Coombe, MD      . LORazepam (ATIVAN) tablet 1 mg  1  mg Oral Q6H PRN Rogue Bussing, MD   1 mg at 12/24/16 1040   Or  . LORazepam (ATIVAN) injection 1 mg  1 mg Intravenous Q6H PRN Rogue Bussing, MD      . metoprolol tartrate (LOPRESSOR) tablet 25 mg  25 mg Oral Daily Steve Rattler, DO   25 mg at 12/24/16 1326  . morphine 4 MG/ML injection 7 mg  7 mg Intravenous Q4H Rogue Bussing, MD   7 mg at 12/24/16 0645  . multivitamin with minerals tablet 1 tablet  1 tablet Oral Daily Rogue Bussing, MD   1 tablet at 12/24/16 1318  . nicotine (NICODERM CQ - dosed in mg/24 hours) patch 21 mg  21 mg Transdermal Daily Rogue Bussing, MD   21 mg at 12/24/16 1324  . oxyCODONE-acetaminophen (PERCOCET/ROXICET) 5-325 MG per tablet 1 tablet  1 tablet Oral Q4H PRN Blane Ohara McDiarmid, MD       And  . oxyCODONE (Oxy IR/ROXICODONE) immediate release tablet 5 mg  5 mg Oral Q4H PRN Blane Ohara McDiarmid, MD   5 mg at 12/24/16 1334  . pravastatin  (PRAVACHOL) tablet 40 mg  40 mg Oral q1800 Steve Rattler, DO   40 mg at 12/24/16 1723  . QUEtiapine (SEROQUEL) tablet 25 mg  25 mg Oral TID Ambrose Finland, MD      . sodium chloride flush (NS) 0.9 % injection 3 mL  3 mL Intravenous Q12H Rogue Bussing, MD   3 mL at 12/23/16 2332  . thiamine (B-1) injection 250 mg  250 mg Intravenous Q24H Everrett Coombe, MD   250 mg at 12/24/16 1723    Musculoskeletal: Strength & Muscle Tone: within normal limits Gait & Station: unable to stand Patient leans: N/A  Psychiatric Specialty Exam: Physical Exam as per history and physical   ROS chronic back pain and reportedly taking excessive medication even though she does not want take medication for pain. Patient blames her primary care physician and pain management does not want to stop her medication for pain.  No Fever-chills, No Headache, No changes with Vision or hearing, reports vertigo No problems swallowing food or Liquids, No Chest pain, Cough or Shortness of Breath, No Abdominal pain, No Nausea or Vommitting, Bowel movements are regular, No Blood in stool or Urine, No dysuria, No new skin rashes or bruises, No new joints pains-aches,  No new weakness, tingling, numbness in any extremity, No recent weight gain or loss, No polyuria, polydypsia or polyphagia,  A full 10 point Review of Systems was done, except as stated above, all other Review of Systems were negative.  Blood pressure 112/74, pulse 88, temperature 98.4 F (36.9 C), temperature source Oral, resp. rate 20, height _0  (1.6 m), weight 60.4 kg (133 lb 3.2 oz), SpO2 100 %.Body mass index is 23.6 kg/m.  General Appearance: Guarded  Eye Contact:  Good  Speech:  Pressured  Volume:  Increased  Mood:  Anxious and Depressed  Affect:  Labile  Thought Process:  Coherent and Goal Directed  Orientation:  Full (Time, Place, and Person)  Thought Content:  Rumination and Tangential  Suicidal Thoughts:  No  Homicidal  Thoughts:  No  Memory:  Immediate;   Fair Recent;   Fair Remote;   Fair  Judgement:  Fair  Insight:  Good  Psychomotor Activity:  Increased and Restlessness  Concentration:  Concentration: Fair and Attention Span: Fair  Recall:  Good  Fund of Knowledge:  Good  Language:  Good  Akathisia:  Negative  Handed:  Right  AIMS (if indicated):     Assets:  Communication Skills Desire for Improvement Financial Resources/Insurance Housing Intimacy Leisure Time Resilience Social Support Transportation  ADL's:  Intact  Cognition:  Impaired,  Mild  Sleep:        Treatment Plan Summary: 66 years old female with chronic back pain and excessive pain medication management also benzodiazepines presented with manic-like symptoms secondary to sleepiness nights over 2 weeks, confusion, forgetfulness, losing periods of time, tangential, pressured speech and racing thoughts.   Monitor for the benzodiazepine and opioid withdrawal symptoms Patient has no safety concerns We start Seroquel 25 mg 3 times daily for mood swings and anxiety Discontinue BuSpar which is not helpful We will increase gabapentin 600 mg 3 times daily for chronic pain Patient may benefit from Cymbalta once she has stable mood swings Daily contact with patient to assess and evaluate symptoms and progress in treatment and Medication management  Appreciate psychiatric consultation and follow up as clinically required Please contact [phone] or [phone] if needs further assistance  Disposition: No evidence of imminent risk to self or others at present.   Supportive therapy provided about ongoing stressors.  Ambrose Finland, MD 12/24/2016 5:28 PM

## 2016-12-25 DIAGNOSIS — R404 Transient alteration of awareness: Secondary | ICD-10-CM

## 2016-12-25 DIAGNOSIS — I10 Essential (primary) hypertension: Secondary | ICD-10-CM

## 2016-12-25 DIAGNOSIS — M961 Postlaminectomy syndrome, not elsewhere classified: Secondary | ICD-10-CM

## 2016-12-25 LAB — HIV ANTIBODY (ROUTINE TESTING W REFLEX): HIV SCREEN 4TH GENERATION: NONREACTIVE

## 2016-12-25 MED ORDER — ALPRAZOLAM 1 MG PO TABS: 0.5000 mg | ORAL_TABLET | Freq: Two times a day (BID) | ORAL | 0 refills | Status: DC

## 2016-12-25 MED ORDER — QUETIAPINE FUMARATE 25 MG PO TABS: 25.0000 mg | ORAL_TABLET | Freq: Three times a day (TID) | ORAL | 0 refills | Status: DC

## 2016-12-25 MED ORDER — GABAPENTIN 600 MG PO TABS: 600.0000 mg | ORAL_TABLET | Freq: Three times a day (TID) | ORAL | 0 refills | Status: DC

## 2016-12-25 NOTE — Progress Notes (Signed)
  Family Medicine Teaching Service Daily Progress Note Intern Pager: [phone]  Patient name: Dawn OrleansJudy C Batte Medical record number: [id number] Date of birth: 1951/06/21 Age: 66 y.o. Gender: female  Primary Care Provider: Lonie PeakNathan Conroy, PA-C Consultants: neurology Code Status: full  Pt Overview and Major Events to Date:  2/6- admitted to FPTS with abnormal speech  Assessment and Plan: Dawn Beasley is a 66 y.o. female presenting with change in speech. PMH is significant for lumbar spinal stenosis s/p SCS placement last year, HTN, HLD, arthritis, and anxiety.   #AMS/speech change- resolved: Patient unable to verbalize normally, speaking "word salad" but not aphasic as able to name objects on Neuro exam. CT head without any acute abnormalities. Cannot obtain MRI brain due to SCS. Differential includes benzo withdrawal vs neurologic event vs wernicke's encephalopathy vs psychogenic etiology. - Neurology signed off - IV ativan 0.5 mg q6h scheduled- will transition to PO - CIWA protocol ordered for alcohol history- overnight 3, 4, 1 -psych following, appreciate recs- started seroquel 25mg  TID -folate >100, B12 256 - PT/OT evaluation  #Hyponatremia- resolved: 128 on admission > 138 - Hold maxide  #Chronic pain: on 90 morphine equivalents of percocet at home. - continue home medications - increase gabapentin to 600 TID per psych  #HTN- stable: BP 155/81 - Continue home norvasc 10 mg daily, metoprolol 25 mg daily, and triamterene-HCTZ 37.5-25 mg (1.5 tabs) daily while NPO  #Anxiety: - Holding xanax and vistaril - per psych, discontinue buspar - can start cymbalta as outpatient - Ativan as above  #HLD- stable - Continue home pravastatin 40 mg as able  FEN/GI: regular diet Prophylaxis: Lovenox SQ  Disposition: pending clinical improvement  Subjective:  Ms. Dawn Beasley is feeling well today, she wants to go home. Her speech is normal although pressured. Denies feeling confused.  States shecan not remember some events over past several days. Thinks its due to a new blood pressure medicine she started end of January (norvasc 10mg ). States she stopped benzos mid-January.   Objective: Temp:  [98.4 F (36.9 C)-98.9 F (37.2 C)] 98.9 F (37.2 C) (02/08 0533) Pulse Rate:  [84-125] 125 (02/08 0533) Resp:  [18-20] 18 (02/08 0533) BP: (109-155)/(68-81) 155/81 (02/08 0533) SpO2:  [90 %-100 %] 90 % (02/08 0533) Physical Exam: General: middle aged lady laying in bed in NAD Cardiovascular: RRR no MRG Respiratory: no increased WOB CTAB Abdomen: soft, non-tender, non-distended, +BS Extremities: no edema or cyanosis Neuro: no focal deficits. Speech pressured but intelligible   Laboratory:  Recent Labs Lab 12/23/16 0926 12/24/16 0721  WBC 10.9* 9.6  HGB 17.1* 14.4  HCT 47.6* 43.2  PLT 277 263    Recent Labs Lab 12/23/16 0926 12/23/16 1929 12/24/16 0721  NA 128* 131* 138  K 3.1* 2.7* 3.8  CL 82* 93* 98*  CO2 26 23 29   BUN 12 7 14   CREATININE 0.86 0.76 0.63  CALCIUM 9.6 8.5* 8.5*  PROT 7.1  --   --   BILITOT 1.4*  --   --   ALKPHOS 53  --   --   ALT 23  --   --   AST 33  --   --   GLUCOSE 91 78 90   TSH 0.588  EEG with background slowing  Imaging/Diagnostic Tests: No results found.   Tillman SersAngela C Alexsandria Kivett, DO 12/25/2016, 2:51 PM PGY-1, [facility]S Intern pager: [phone], text pages welcome

## 2016-12-25 NOTE — Discharge Summary (Signed)
[facility]  Patient name: Dawn Beasley Medical record number: [id number] Date of birth: [birth year] Age: 66 y.o. Gender: female Date of Admission: 12/23/2016  Date of Discharge: 12/25/16  Admitting Physician: Leighton Roachodd D McDiarmid, MD  Primary Care Provider: Lonie PeakNathan Conroy, PA-C Consultants: neurology, psychiatry  Indication for Hospitalization: altered mental status  Discharge Diagnoses/Problem List:  Altered Mental Status Benzodiazepine dependence Chronic pain Opioid dependence Alcohol abuse HTN Anxiety  Disposition: home  Discharge Condition: stable, improved  Discharge Exam: see progress note from day of discharge  Brief Hospital Course:   [name] is a 66 year old female who [facility] status. [facility]   Initially there was high concern for stroke given patient's AMS and altered speech. Neurology was consulted. Head CT was negative. MRI could not be obtained due to presence of spinal stimulator. Neurology felt the AMS was due to possible withdrawal from benzodiazepines. Patients UDS was positive for opiates and benzodiazepines. She was given IV Ativan with little improvement. She became agitated overnight and was given Valium with some improvement. Patient continued to have confusion and pressured speech consistent with "word salad". She could answer questions appropriately and then continue to speak nonsensically, almost compulsively. Psychiatry was consulted. She was given a dose of risperdol with improvement within an hour. She returned to her baseline mental status and felt ready for discharge. After discussion with her primary care provider, Lonie PeakNathan Conroy PA-C, the decision was made to discharge her with a short supply of benzodiazepines to avoid future withdrawal and refer the patient to psychiatry to manage this medication. Her other medical problems were stable  during admission. She was discharged home on 12/25/16 with recommendation to follow up with her PCP within 2-3 days.   Issues for Follow Up:  1. Follow up with psychiatry for anxiety 2. Continue tapering benzodiazepines, anticipate this may take a long time as patient has taken these for >25 years 3. Continue tapering narcotic use as able  Significant Procedures: none  Significant Labs and Imaging:   Recent Labs Lab 12/23/16 0926 12/24/16 0721  WBC 10.9* 9.6  HGB 17.1* 14.4  HCT 47.6* 43.2  PLT 277 263    Recent Labs Lab 12/23/16 0926 12/23/16 1929 12/24/16 0721  NA 128* 131* 138  K 3.1* 2.7* 3.8  CL 82* 93* 98*  CO2 26 23 29   GLUCOSE 91 78 90  BUN 12 7 14   CREATININE 0.86 0.76 0.63  CALCIUM 9.6 8.5* 8.5*  ALKPHOS 53  --   --   AST 33  --   --   ALT 23  --   --   ALBUMIN 4.1  --   --    Ct Angio Head W Or Wo Contrast  Result Date: 12/23/2016 CLINICAL DATA:  Altered mental status and pressured speech. EXAM: CT ANGIOGRAPHY HEAD AND NECK TECHNIQUE: Multidetector CT imaging of the head and neck was performed using the standard protocol during bolus administration of intravenous contrast. Multiplanar CT image reconstructions and MIPs were obtained to evaluate the vascular anatomy. Carotid stenosis measurements (when applicable) are obtained utilizing NASCET criteria, using the distal internal carotid diameter as the denominator. CONTRAST:  50 mL Isovue 370 COMPARISON:  Noncontrast head CT earlier today FINDINGS: CT HEAD FINDINGS Brain: There is no evidence of acute cortical infarct, intracranial hemorrhage, mass, midline shift, or extra-axial fluid collection. Ventricles and sulci are normal for age. Vascular: Mild calcified  atherosclerosis at the skullbase. No hyperdense vessel. Skull: No fracture or focal osseous lesion. Sinuses: Prior internal fixation of maxillary sinus fractures. No significant inflammatory sinus disease. Minimal right mastoid air cell opacification. Orbits:  Unremarkable. Review of the MIP images confirms the above findings CTA NECK FINDINGS Aortic arch: 3 vessel aortic arch with mild atherosclerotic plaque. Brachiocephalic and subclavian arteries are widely patent. Right carotid system: Mild calcified and soft plaque in the proximal ICA without significant stenosis. No evidence of dissection. Left carotid system: Minimal calcified and soft plaque at the carotid bifurcation and in the proximal ICA without stenosis. No evidence of dissection. Vertebral arteries: Patent and codominant without evidence of stenosis or dissection. Skeleton: Mild cervical disc degeneration, predominantly C4-C7. Slight retrolisthesis of C3 on C4. Other neck: No mass or lymph node enlargement. Upper chest: Mild centrilobular emphysema. Review of the MIP images confirms the above findings CTA HEAD FINDINGS Anterior circulation: Internal carotid arteries are patent from skullbase to carotid termini. There is mild carotid siphon atherosclerosis bilaterally without significant stenosis. ACAs and MCAs are patent without evidence of significant A1 or M1 stenosis. There is prominent ACA and MCA branch vessel atherosclerotic type irregularity bilaterally with focal regions of up to severe stenosis. No aneurysm. Posterior circulation: Intracranial vertebral arteries are widely patent to the basilar. PICA, AICA, and SCA origins are patent. Basilar artery is widely patent. Posterior communicating arteries are not identified. PCAs are patent with mild atherosclerotic type irregularity bilaterally as well as mild right greater than left P2 segment narrowing. No aneurysm. Venous sinuses: Patent. Dominant right transverse and right sigmoid sinuses. Anatomic variants: None. Delayed phase: No abnormal enhancement. Review of the MIP images confirms the above findings IMPRESSION: 1. No large vessel occlusion. 2. Prominent intracranial branch vessel atherosclerosis. No significant large vessel stenosis. 3. Mild  cervical carotid atherosclerosis without stenosis. Electronically Signed   By: Sebastian Ache M.D.   On: 12/23/2016 15:35   Ct Head Wo Contrast  Result Date: 12/23/2016 CLINICAL DATA:  Altered mental status. EXAM: CT HEAD WITHOUT CONTRAST TECHNIQUE: Contiguous axial images were obtained from the base of the skull through the vertex without intravenous contrast. COMPARISON:  None. FINDINGS: Brain: No evidence of acute infarction, hemorrhage, hydrocephalus, extra-axial collection or mass lesion/mass effect. Vascular: No hyperdense vessel or unexpected calcification. Skull: Normal. Negative for fracture or focal lesion. Sinuses/Orbits: Right maxillary mucous retention cyst is noted. Other: None. IMPRESSION: No acute intracranial abnormality seen. Electronically Signed   By: Lupita Raider, M.D.   On: 12/23/2016 10:42   Ct Angio Neck W Or Wo Contrast  Result Date: 12/23/2016 CLINICAL DATA:  Altered mental status and pressured speech. EXAM: CT ANGIOGRAPHY HEAD AND NECK TECHNIQUE: Multidetector CT imaging of the head and neck was performed using the standard protocol during bolus administration of intravenous contrast. Multiplanar CT image reconstructions and MIPs were obtained to evaluate the vascular anatomy. Carotid stenosis measurements (when applicable) are obtained utilizing NASCET criteria, using the distal internal carotid diameter as the denominator. CONTRAST:  50 mL Isovue 370 COMPARISON:  Noncontrast head CT earlier today FINDINGS: CT HEAD FINDINGS Brain: There is no evidence of acute cortical infarct, intracranial hemorrhage, mass, midline shift, or extra-axial fluid collection. Ventricles and sulci are normal for age. Vascular: Mild calcified atherosclerosis at the skullbase. No hyperdense vessel. Skull: No fracture or focal osseous lesion. Sinuses: Prior internal fixation of maxillary sinus fractures. No significant inflammatory sinus disease. Minimal right mastoid air cell opacification. Orbits:  Unremarkable. Review of the MIP images  confirms the above findings CTA NECK FINDINGS Aortic arch: 3 vessel aortic arch with mild atherosclerotic plaque. Brachiocephalic and subclavian arteries are widely patent. Right carotid system: Mild calcified and soft plaque in the proximal ICA without significant stenosis. No evidence of dissection. Left carotid system: Minimal calcified and soft plaque at the carotid bifurcation and in the proximal ICA without stenosis. No evidence of dissection. Vertebral arteries: Patent and codominant without evidence of stenosis or dissection. Skeleton: Mild cervical disc degeneration, predominantly C4-C7. Slight retrolisthesis of C3 on C4. Other neck: No mass or lymph node enlargement. Upper chest: Mild centrilobular emphysema. Review of the MIP images confirms the above findings CTA HEAD FINDINGS Anterior circulation: Internal carotid arteries are patent from skullbase to carotid termini. There is mild carotid siphon atherosclerosis bilaterally without significant stenosis. ACAs and MCAs are patent without evidence of significant A1 or M1 stenosis. There is prominent ACA and MCA branch vessel atherosclerotic type irregularity bilaterally with focal regions of up to severe stenosis. No aneurysm. Posterior circulation: Intracranial vertebral arteries are widely patent to the basilar. PICA, AICA, and SCA origins are patent. Basilar artery is widely patent. Posterior communicating arteries are not identified. PCAs are patent with mild atherosclerotic type irregularity bilaterally as well as mild right greater than left P2 segment narrowing. No aneurysm. Venous sinuses: Patent. Dominant right transverse and right sigmoid sinuses. Anatomic variants: None. Delayed phase: No abnormal enhancement. Review of the MIP images confirms the above findings IMPRESSION: 1. No large vessel occlusion. 2. Prominent intracranial branch vessel atherosclerosis. No significant large vessel stenosis. 3. Mild  cervical carotid atherosclerosis without stenosis. Electronically Signed   By: Sebastian Ache M.D.   On: 12/23/2016 15:35     Results/Tests Pending at Time of Discharge: none  Discharge Medications:  Allergies as of 12/25/2016      Reactions   Celebrex [celecoxib] Swelling   Throat swelling   Sulfa Antibiotics Swelling   Throat swelling      Medication List    STOP taking these medications   busPIRone 15 MG tablet Commonly known as:  BUSPAR   gabapentin 300 MG capsule Commonly known as:  NEURONTIN Replaced by:  gabapentin 600 MG tablet     TAKE these medications   ALPRAZolam 1 MG tablet Commonly known as:  XANAX Take 0.5 tablets (0.5 mg total) by mouth 2 (two) times daily. What changed:  how much to take   amLODipine 10 MG tablet Commonly known as:  NORVASC Take 10 mg by mouth daily.   gabapentin 600 MG tablet Commonly known as:  NEURONTIN Take 1 tablet (600 mg total) by mouth 3 (three) times daily. Replaces:  gabapentin 300 MG capsule   hydrOXYzine 50 MG capsule Commonly known as:  VISTARIL Take 50 mg by mouth 3 (three) times daily as needed for anxiety.   methocarbamol 500 MG tablet Commonly known as:  ROBAXIN Take 1 tablet (500 mg total) by mouth 3 (three) times daily as needed for muscle spasms.   metoprolol tartrate 25 MG tablet Commonly known as:  LOPRESSOR Take 25 mg by mouth daily.   ondansetron 4 MG tablet Commonly known as:  ZOFRAN Take 1 tablet (4 mg total) by mouth every 8 (eight) hours as needed for nausea or vomiting.   oxyCODONE-acetaminophen 10-325 MG tablet Commonly known as:  PERCOCET Take 1 tablet by mouth every 4 (four) hours as needed for pain.   pravastatin 40 MG tablet Commonly known as:  PRAVACHOL Take 40 mg by mouth daily.  QUEtiapine 25 MG tablet Commonly known as:  SEROQUEL Take 1 tablet (25 mg total) by mouth 3 (three) times daily.   triamterene-hydrochlorothiazide 37.5-25 MG tablet Commonly known as:  MAXZIDE-25 Take 1.5  tablets by mouth every morning.            Durable Medical Equipment        Start     Ordered   12/24/16 1429  For home use only DME Bedside commode  Once    Comments:  3 in 1  Question:  Patient needs a bedside commode to treat with the following condition  Answer:  Falls   12/24/16 1428      Discharge Instructions: Please refer to Patient Instructions section of EMR for full details.  Patient was counseled important signs and symptoms that should prompt return to medical care, changes in medications, dietary instructions, activity restrictions, and follow up appointments.   Follow-Up Appointments: Follow-up Information    Lonie Peak, PA-C. Schedule an appointment as soon as possible for a visit.   Specialty:  Physician Assistant Why:  as soon as possible for hospital follow up Contact information: [address] [place] [address] [phone]           Tillman Sers, DO 12/25/2016, 4:04 PM PGY-1, [facility]

## 2016-12-25 NOTE — Discharge Instructions (Signed)
Please follow up with your PCP within 3-4 days to ensure you are doing well on your medications.   Please follow up with psychiatry for continued benzodiazepine prescriptions.  Benzodiazepine Withdrawal Benzodiazepines are prescription medicines that decrease the activity of (depress) the central nervous system and cause changes in certain brain chemicals (neurotransmitters). Withdrawal is a group of physical and mental symptoms that can happen when you suddenly stop taking a medicine. There are many types of benzodiazepines. Some benzodiazepines take effect quickly and stay in your system for a short amount of time (short-acting). Other benzodiazepines require more time to take effect and stay in your system for longer amounts of time (long-acting). The five most commonly prescribed benzodiazepines are:  Alprazolam.  Lorazepam.  Clonazepam.  Diazepam.  Temazepam. What are the causes? When you take benzodiazepines, your brain needs more and more of the medicine over time in order to get the same effects from it. This increased need is called tolerance. As you develop a tolerance, your brain adapts to the effects of the benzodiazepine and relies on these effects. This is called dependency. Withdrawal happens when you suddenly stop taking your medicine. This does not give your brain enough time to adapt to not having the medicine. What increases the risk? This condition is more likely to develop in:  People who have taken benzodiazepines for more than 1-2 weeks.  People who have developed a tolerance for benzodiazepines.  People who have developed a dependence on benzodiazepines.  People who take high dosages of benzodiazepines.  People who take doses of benzodiazepines that are higher than prescribed.  People who take benzodiazepines without a prescription.  People who use benzodiazepines with other substances that depress the central nervous system, such as alcohol.  People who  have a history of drug or alcohol abuse. What are the signs or symptoms? Symptoms of this condition may include:  Difficulty sleeping.  Anxiety.  Restlessness.  Irritability.  Muscle aches.  Involuntary shaking or trembling of a body part (tremor).  Confusion and poor concentration.  Vomiting.  Sweating.  Headaches.  Feeling or seeing things that are not there (hallucinations).  Seizures. Symptoms of withdrawal from short-acting benzodiazepines may develop 1-2 days after you stop taking your medicine, and they may last for 2-4 weeks or longer. Symptoms of withdrawal from long-acting benzodiazepines may develop 2-7 days after you stop taking your medicine, and they may last for 2-8 weeks or longer. How is this diagnosed? This condition may be diagnosed based on:  Your symptoms.  A physical exam. Your health care provider may check for:  Rapid heartbeat.  Rapid breathing.  Tremors.  High blood pressure.  Blood tests.  Urine tests.  Your alcohol and drug habits.  Your medical history. How is this treated? Treatment for this condition depends on:  Your symptoms.  The type of benzodiazepine you have been taking.  How long you have been taking benzodiazepines. Treatment usually involves starting you on a safe and stable dose of a benzodiazepine and then slowly lowering your dosage over time (tapered withdrawal). This may be done at a hospital or a treatment center. Long-term treatment for this condition may involve medicine, counseling, and support groups. Follow these instructions at home:  Take over-the-counter and prescription medicines only as told by your health care provider.  Check with your health care provider before starting new medicines.  Keep all follow-up visits as told by your health care provider. This is important. How is this prevented?  Do not  take any benzodiazepines without a prescription.  Do not take more than your prescribed  dosage.  Do not mix benzodiazepines with alcohol or other medicines.  Do not stop taking benzodiazepines without speaking with your health care provider. Contact a health care provider if:  You are not able to take your medicines as told by your health care provider.  You have symptoms that get worse.  You develop withdrawal symptoms during your tapered withdrawal.  You develop a craving for drugs or alcohol.  You experience withdrawal again (relapse). Get help right away if:  You have a seizure.  You become very confused.  You lose consciousness.  You have difficulty breathing.  You have serious thoughts about hurting yourself or someone else. This information is not intended to replace advice given to you by your health care provider. Make sure you discuss any questions you have with your health care provider. Document Released: 10/23/2011 Document Revised: 03/13/2016 Document Reviewed: 04/25/2015 Elsevier Interactive Patient Education  2017 [employer]

## 2016-12-25 NOTE — Progress Notes (Signed)
Pt ready for discharge. Pt. Is alert and oriented. Pt is hemodynamically stable. IV removed by patient's RN. AVS reviewed with pt. Capable of re verbalizing medication regimen. Discharge plan appropriate and in place.

## 2016-12-25 NOTE — Care Management Note (Signed)
Case Management Note  Patient Details  Name: Dawn Beasley MRN: [id number] Date of Birth: [birth year]  Subjective/Objective:                 Patient admitted with acute encephalopathy. Spoke with patient's husband, declined offer for DME, BSC. No CM needs identified at this time.    Action/Plan:  Patient will DC to home with spouse today.  Expected Discharge Date:  12/25/16               Expected Discharge Plan:  Home/Self Care  In-House Referral:     Discharge planning Services  CM Consult  Post Acute Care Choice:    Choice offered to:     DME Arranged:    DME Agency:     HH Arranged:    HH Agency:     Status of Service:  Completed, signed off  If discussed at [employer] Length of Stay Meetings, dates discussed:    Additional Comments:  Lawerance SabalDebbie Felton Buczynski, RN 12/25/2016, 4:08 PM

## 2016-12-27 NOTE — ED Notes (Signed)
Wasted 1.5mg  Ativan in sharps with Maxine GlennNikki RN

## 2017-06-09 ENCOUNTER — Ambulatory Visit: Payer: BLUE CROSS/BLUE SHIELD | Admitting: Physical Therapy

## 2017-06-22 ENCOUNTER — Ambulatory Visit: Payer: BLUE CROSS/BLUE SHIELD | Admitting: Physical Therapy

## 2017-07-01 ENCOUNTER — Ambulatory Visit: Payer: BLUE CROSS/BLUE SHIELD | Attending: Physical Medicine and Rehabilitation

## 2017-07-01 DIAGNOSIS — M5441 Lumbago with sciatica, right side: Secondary | ICD-10-CM | POA: Insufficient documentation

## 2017-07-01 DIAGNOSIS — G8929 Other chronic pain: Secondary | ICD-10-CM

## 2017-07-01 DIAGNOSIS — M5442 Lumbago with sciatica, left side: Secondary | ICD-10-CM | POA: Insufficient documentation

## 2017-07-01 NOTE — Therapy (Signed)
[facility] [address] [place], [place], [address] Phone: [phone]   Fax:  [phone]  Patient Details  Name: Dawn Beasley MRN: [id number] Date of Birth: [birth year] Referring Provider:  Sheran Luzamos, Richard, MD  Encounter Date: 07/01/2017        FCE testing was stopped due to high BP with max of 182/105   And best at 173/93.[name]. HER primary care physician Lonie PeakNathan Conroy, PA was contacted and I  spoke his  Nurse. She stated she would talk to Mr Anna GenreConroy and if he feels she is stable he will fax a clearance note so we can do the FCE testing   Caprice RedChasse, [name] M  PT 07/01/2017, 8:50 AM  [facility] [address] [place], [place], [address] Phone: [phone]   Fax:  [phone]

## 2017-07-08 ENCOUNTER — Ambulatory Visit: Payer: BLUE CROSS/BLUE SHIELD

## 2017-12-07 DIAGNOSIS — Z79891 Long term (current) use of opiate analgesic: Secondary | ICD-10-CM | POA: Insufficient documentation

## 2018-02-22 DIAGNOSIS — I10 Essential (primary) hypertension: Secondary | ICD-10-CM | POA: Diagnosis not present

## 2018-02-22 DIAGNOSIS — K529 Noninfective gastroenteritis and colitis, unspecified: Secondary | ICD-10-CM | POA: Diagnosis not present

## 2018-02-22 DIAGNOSIS — M549 Dorsalgia, unspecified: Secondary | ICD-10-CM | POA: Diagnosis not present

## 2018-02-22 DIAGNOSIS — R531 Weakness: Secondary | ICD-10-CM | POA: Diagnosis not present

## 2018-02-22 DIAGNOSIS — R296 Repeated falls: Secondary | ICD-10-CM | POA: Diagnosis not present

## 2018-03-10 DIAGNOSIS — F418 Other specified anxiety disorders: Secondary | ICD-10-CM | POA: Diagnosis not present

## 2018-03-10 DIAGNOSIS — K5903 Drug induced constipation: Secondary | ICD-10-CM | POA: Diagnosis not present

## 2018-03-10 DIAGNOSIS — Z6821 Body mass index (BMI) 21.0-21.9, adult: Secondary | ICD-10-CM | POA: Diagnosis not present

## 2018-03-10 DIAGNOSIS — E871 Hypo-osmolality and hyponatremia: Secondary | ICD-10-CM | POA: Diagnosis not present

## 2018-03-10 DIAGNOSIS — I10 Essential (primary) hypertension: Secondary | ICD-10-CM | POA: Diagnosis not present

## 2018-03-24 DIAGNOSIS — Z79891 Long term (current) use of opiate analgesic: Secondary | ICD-10-CM | POA: Diagnosis not present

## 2018-03-29 DIAGNOSIS — E871 Hypo-osmolality and hyponatremia: Secondary | ICD-10-CM | POA: Diagnosis not present

## 2018-05-11 DIAGNOSIS — M549 Dorsalgia, unspecified: Secondary | ICD-10-CM | POA: Diagnosis not present

## 2018-05-11 DIAGNOSIS — Z1339 Encounter for screening examination for other mental health and behavioral disorders: Secondary | ICD-10-CM | POA: Diagnosis not present

## 2018-05-11 DIAGNOSIS — Z6821 Body mass index (BMI) 21.0-21.9, adult: Secondary | ICD-10-CM | POA: Diagnosis not present

## 2018-05-11 DIAGNOSIS — Z9181 History of falling: Secondary | ICD-10-CM | POA: Diagnosis not present

## 2018-05-11 DIAGNOSIS — F418 Other specified anxiety disorders: Secondary | ICD-10-CM | POA: Diagnosis not present

## 2018-05-11 DIAGNOSIS — K5903 Drug induced constipation: Secondary | ICD-10-CM | POA: Diagnosis not present

## 2018-05-11 DIAGNOSIS — I10 Essential (primary) hypertension: Secondary | ICD-10-CM | POA: Diagnosis not present

## 2018-07-15 DIAGNOSIS — F418 Other specified anxiety disorders: Secondary | ICD-10-CM | POA: Diagnosis not present

## 2018-07-15 DIAGNOSIS — M549 Dorsalgia, unspecified: Secondary | ICD-10-CM | POA: Diagnosis not present

## 2018-07-15 DIAGNOSIS — Z79899 Other long term (current) drug therapy: Secondary | ICD-10-CM | POA: Diagnosis not present

## 2018-07-15 DIAGNOSIS — I10 Essential (primary) hypertension: Secondary | ICD-10-CM | POA: Diagnosis not present

## 2018-07-15 DIAGNOSIS — Z6820 Body mass index (BMI) 20.0-20.9, adult: Secondary | ICD-10-CM | POA: Diagnosis not present

## 2018-07-15 DIAGNOSIS — L659 Nonscarring hair loss, unspecified: Secondary | ICD-10-CM | POA: Diagnosis not present

## 2018-07-26 DIAGNOSIS — Z79891 Long term (current) use of opiate analgesic: Secondary | ICD-10-CM | POA: Diagnosis not present

## 2018-07-26 DIAGNOSIS — M545 Low back pain: Secondary | ICD-10-CM | POA: Diagnosis not present

## 2018-07-26 DIAGNOSIS — G894 Chronic pain syndrome: Secondary | ICD-10-CM | POA: Diagnosis not present

## 2018-09-15 DIAGNOSIS — I10 Essential (primary) hypertension: Secondary | ICD-10-CM | POA: Diagnosis not present

## 2018-09-15 DIAGNOSIS — Z6820 Body mass index (BMI) 20.0-20.9, adult: Secondary | ICD-10-CM | POA: Diagnosis not present

## 2018-09-15 DIAGNOSIS — Z23 Encounter for immunization: Secondary | ICD-10-CM | POA: Diagnosis not present

## 2018-09-15 DIAGNOSIS — R634 Abnormal weight loss: Secondary | ICD-10-CM | POA: Diagnosis not present

## 2018-09-15 DIAGNOSIS — Z87891 Personal history of nicotine dependence: Secondary | ICD-10-CM | POA: Diagnosis not present

## 2018-09-15 DIAGNOSIS — R110 Nausea: Secondary | ICD-10-CM | POA: Diagnosis not present

## 2018-09-15 DIAGNOSIS — L659 Nonscarring hair loss, unspecified: Secondary | ICD-10-CM | POA: Diagnosis not present

## 2018-09-15 DIAGNOSIS — F418 Other specified anxiety disorders: Secondary | ICD-10-CM | POA: Diagnosis not present

## 2018-09-15 DIAGNOSIS — F329 Major depressive disorder, single episode, unspecified: Secondary | ICD-10-CM | POA: Diagnosis not present

## 2018-09-15 DIAGNOSIS — M549 Dorsalgia, unspecified: Secondary | ICD-10-CM | POA: Diagnosis not present

## 2018-09-15 DIAGNOSIS — D751 Secondary polycythemia: Secondary | ICD-10-CM | POA: Diagnosis not present

## 2018-09-15 DIAGNOSIS — F411 Generalized anxiety disorder: Secondary | ICD-10-CM | POA: Diagnosis not present

## 2018-10-06 DIAGNOSIS — R630 Anorexia: Secondary | ICD-10-CM | POA: Diagnosis not present

## 2018-10-06 DIAGNOSIS — R918 Other nonspecific abnormal finding of lung field: Secondary | ICD-10-CM | POA: Diagnosis not present

## 2018-10-06 DIAGNOSIS — R634 Abnormal weight loss: Secondary | ICD-10-CM | POA: Diagnosis not present

## 2018-10-21 DIAGNOSIS — L219 Seborrheic dermatitis, unspecified: Secondary | ICD-10-CM | POA: Diagnosis not present

## 2018-10-21 DIAGNOSIS — L299 Pruritus, unspecified: Secondary | ICD-10-CM | POA: Diagnosis not present

## 2018-10-26 DIAGNOSIS — R634 Abnormal weight loss: Secondary | ICD-10-CM | POA: Diagnosis not present

## 2018-10-26 DIAGNOSIS — R933 Abnormal findings on diagnostic imaging of other parts of digestive tract: Secondary | ICD-10-CM | POA: Diagnosis not present

## 2018-10-26 DIAGNOSIS — K5903 Drug induced constipation: Secondary | ICD-10-CM | POA: Diagnosis not present

## 2018-11-19 DIAGNOSIS — G8921 Chronic pain due to trauma: Secondary | ICD-10-CM | POA: Diagnosis not present

## 2018-11-19 DIAGNOSIS — F411 Generalized anxiety disorder: Secondary | ICD-10-CM | POA: Diagnosis not present

## 2018-11-19 DIAGNOSIS — M549 Dorsalgia, unspecified: Secondary | ICD-10-CM | POA: Diagnosis not present

## 2018-11-19 DIAGNOSIS — G894 Chronic pain syndrome: Secondary | ICD-10-CM | POA: Diagnosis not present

## 2018-11-19 DIAGNOSIS — F418 Other specified anxiety disorders: Secondary | ICD-10-CM | POA: Diagnosis not present

## 2018-11-19 DIAGNOSIS — Z6821 Body mass index (BMI) 21.0-21.9, adult: Secondary | ICD-10-CM | POA: Diagnosis not present

## 2018-11-19 DIAGNOSIS — R935 Abnormal findings on diagnostic imaging of other abdominal regions, including retroperitoneum: Secondary | ICD-10-CM | POA: Diagnosis not present

## 2018-11-19 DIAGNOSIS — G8922 Chronic post-thoracotomy pain: Secondary | ICD-10-CM | POA: Diagnosis not present

## 2018-11-19 DIAGNOSIS — I10 Essential (primary) hypertension: Secondary | ICD-10-CM | POA: Diagnosis not present

## 2018-11-19 DIAGNOSIS — G8929 Other chronic pain: Secondary | ICD-10-CM | POA: Diagnosis not present

## 2018-11-22 DIAGNOSIS — R110 Nausea: Secondary | ICD-10-CM | POA: Diagnosis not present

## 2018-11-22 DIAGNOSIS — Z79891 Long term (current) use of opiate analgesic: Secondary | ICD-10-CM | POA: Diagnosis not present

## 2019-01-18 DIAGNOSIS — R935 Abnormal findings on diagnostic imaging of other abdominal regions, including retroperitoneum: Secondary | ICD-10-CM | POA: Diagnosis not present

## 2019-01-18 DIAGNOSIS — I10 Essential (primary) hypertension: Secondary | ICD-10-CM | POA: Diagnosis not present

## 2019-01-18 DIAGNOSIS — Z79899 Other long term (current) drug therapy: Secondary | ICD-10-CM | POA: Diagnosis not present

## 2019-01-18 DIAGNOSIS — F418 Other specified anxiety disorders: Secondary | ICD-10-CM | POA: Diagnosis not present

## 2019-01-18 DIAGNOSIS — F411 Generalized anxiety disorder: Secondary | ICD-10-CM | POA: Diagnosis not present

## 2019-01-18 DIAGNOSIS — Z6821 Body mass index (BMI) 21.0-21.9, adult: Secondary | ICD-10-CM | POA: Diagnosis not present

## 2019-01-18 DIAGNOSIS — M549 Dorsalgia, unspecified: Secondary | ICD-10-CM | POA: Diagnosis not present

## 2019-03-21 DIAGNOSIS — M5416 Radiculopathy, lumbar region: Secondary | ICD-10-CM | POA: Diagnosis not present

## 2019-04-05 DIAGNOSIS — F418 Other specified anxiety disorders: Secondary | ICD-10-CM | POA: Diagnosis not present

## 2019-04-05 DIAGNOSIS — M549 Dorsalgia, unspecified: Secondary | ICD-10-CM | POA: Diagnosis not present

## 2019-04-05 DIAGNOSIS — Z6822 Body mass index (BMI) 22.0-22.9, adult: Secondary | ICD-10-CM | POA: Diagnosis not present

## 2019-04-05 DIAGNOSIS — E7800 Pure hypercholesterolemia, unspecified: Secondary | ICD-10-CM | POA: Diagnosis not present

## 2019-04-05 DIAGNOSIS — Z139 Encounter for screening, unspecified: Secondary | ICD-10-CM | POA: Diagnosis not present

## 2019-04-05 DIAGNOSIS — I10 Essential (primary) hypertension: Secondary | ICD-10-CM | POA: Diagnosis not present

## 2019-06-28 DIAGNOSIS — F418 Other specified anxiety disorders: Secondary | ICD-10-CM | POA: Diagnosis not present

## 2019-06-28 DIAGNOSIS — Z79899 Other long term (current) drug therapy: Secondary | ICD-10-CM | POA: Diagnosis not present

## 2019-06-28 DIAGNOSIS — Z681 Body mass index (BMI) 19 or less, adult: Secondary | ICD-10-CM | POA: Diagnosis not present

## 2019-06-28 DIAGNOSIS — M549 Dorsalgia, unspecified: Secondary | ICD-10-CM | POA: Diagnosis not present

## 2019-06-28 DIAGNOSIS — Z139 Encounter for screening, unspecified: Secondary | ICD-10-CM | POA: Diagnosis not present

## 2019-06-28 DIAGNOSIS — I10 Essential (primary) hypertension: Secondary | ICD-10-CM | POA: Diagnosis not present

## 2019-06-28 DIAGNOSIS — G8929 Other chronic pain: Secondary | ICD-10-CM | POA: Diagnosis not present

## 2019-06-28 DIAGNOSIS — K439 Ventral hernia without obstruction or gangrene: Secondary | ICD-10-CM | POA: Diagnosis not present

## 2019-06-28 DIAGNOSIS — E7800 Pure hypercholesterolemia, unspecified: Secondary | ICD-10-CM | POA: Diagnosis not present

## 2019-06-28 DIAGNOSIS — Z9181 History of falling: Secondary | ICD-10-CM | POA: Diagnosis not present

## 2019-08-08 DIAGNOSIS — Z79891 Long term (current) use of opiate analgesic: Secondary | ICD-10-CM | POA: Diagnosis not present

## 2019-08-08 DIAGNOSIS — Z79899 Other long term (current) drug therapy: Secondary | ICD-10-CM | POA: Diagnosis not present

## 2019-08-08 DIAGNOSIS — G894 Chronic pain syndrome: Secondary | ICD-10-CM | POA: Diagnosis not present

## 2019-08-08 DIAGNOSIS — Z5181 Encounter for therapeutic drug level monitoring: Secondary | ICD-10-CM | POA: Diagnosis not present

## 2019-08-08 DIAGNOSIS — M545 Low back pain: Secondary | ICD-10-CM | POA: Diagnosis not present

## 2019-08-08 DIAGNOSIS — R110 Nausea: Secondary | ICD-10-CM | POA: Diagnosis not present

## 2019-09-29 DIAGNOSIS — Z87891 Personal history of nicotine dependence: Secondary | ICD-10-CM | POA: Diagnosis not present

## 2019-09-29 DIAGNOSIS — F418 Other specified anxiety disorders: Secondary | ICD-10-CM | POA: Diagnosis not present

## 2019-09-29 DIAGNOSIS — Z23 Encounter for immunization: Secondary | ICD-10-CM | POA: Diagnosis not present

## 2019-09-29 DIAGNOSIS — E7800 Pure hypercholesterolemia, unspecified: Secondary | ICD-10-CM | POA: Diagnosis not present

## 2019-09-29 DIAGNOSIS — I10 Essential (primary) hypertension: Secondary | ICD-10-CM | POA: Diagnosis not present

## 2019-09-29 DIAGNOSIS — M549 Dorsalgia, unspecified: Secondary | ICD-10-CM | POA: Diagnosis not present

## 2019-09-29 DIAGNOSIS — Z6821 Body mass index (BMI) 21.0-21.9, adult: Secondary | ICD-10-CM | POA: Diagnosis not present

## 2019-09-29 DIAGNOSIS — K439 Ventral hernia without obstruction or gangrene: Secondary | ICD-10-CM | POA: Diagnosis not present

## 2019-09-29 DIAGNOSIS — G8929 Other chronic pain: Secondary | ICD-10-CM | POA: Diagnosis not present

## 2019-10-21 DIAGNOSIS — K432 Incisional hernia without obstruction or gangrene: Secondary | ICD-10-CM | POA: Diagnosis not present

## 2019-10-21 DIAGNOSIS — M6208 Separation of muscle (nontraumatic), other site: Secondary | ICD-10-CM | POA: Diagnosis not present

## 2019-10-21 DIAGNOSIS — Z720 Tobacco use: Secondary | ICD-10-CM | POA: Diagnosis not present

## 2019-10-21 DIAGNOSIS — R110 Nausea: Secondary | ICD-10-CM | POA: Diagnosis not present

## 2019-10-21 DIAGNOSIS — F1190 Opioid use, unspecified, uncomplicated: Secondary | ICD-10-CM | POA: Diagnosis not present

## 2019-10-21 DIAGNOSIS — R933 Abnormal findings on diagnostic imaging of other parts of digestive tract: Secondary | ICD-10-CM | POA: Diagnosis not present

## 2019-11-25 ENCOUNTER — Other Ambulatory Visit: Payer: Self-pay | Admitting: General Surgery

## 2019-11-25 DIAGNOSIS — K432 Incisional hernia without obstruction or gangrene: Secondary | ICD-10-CM

## 2019-12-08 DIAGNOSIS — G894 Chronic pain syndrome: Secondary | ICD-10-CM | POA: Diagnosis not present

## 2019-12-14 ENCOUNTER — Other Ambulatory Visit: Payer: BLUE CROSS/BLUE SHIELD

## 2020-01-02 DIAGNOSIS — R935 Abnormal findings on diagnostic imaging of other abdominal regions, including retroperitoneum: Secondary | ICD-10-CM | POA: Diagnosis not present

## 2020-01-02 DIAGNOSIS — Z6820 Body mass index (BMI) 20.0-20.9, adult: Secondary | ICD-10-CM | POA: Diagnosis not present

## 2020-01-02 DIAGNOSIS — I712 Thoracic aortic aneurysm, without rupture: Secondary | ICD-10-CM | POA: Diagnosis not present

## 2020-01-02 DIAGNOSIS — I10 Essential (primary) hypertension: Secondary | ICD-10-CM | POA: Diagnosis not present

## 2020-01-02 DIAGNOSIS — Z79899 Other long term (current) drug therapy: Secondary | ICD-10-CM | POA: Diagnosis not present

## 2020-01-02 DIAGNOSIS — E7800 Pure hypercholesterolemia, unspecified: Secondary | ICD-10-CM | POA: Diagnosis not present

## 2020-01-02 DIAGNOSIS — R911 Solitary pulmonary nodule: Secondary | ICD-10-CM | POA: Diagnosis not present

## 2020-01-02 DIAGNOSIS — G8929 Other chronic pain: Secondary | ICD-10-CM | POA: Diagnosis not present

## 2020-01-02 DIAGNOSIS — M549 Dorsalgia, unspecified: Secondary | ICD-10-CM | POA: Diagnosis not present

## 2020-01-02 DIAGNOSIS — F418 Other specified anxiety disorders: Secondary | ICD-10-CM | POA: Diagnosis not present

## 2020-01-02 DIAGNOSIS — Z87891 Personal history of nicotine dependence: Secondary | ICD-10-CM | POA: Diagnosis not present

## 2020-01-04 ENCOUNTER — Other Ambulatory Visit: Payer: Self-pay | Admitting: Physician Assistant

## 2020-01-04 DIAGNOSIS — I712 Thoracic aortic aneurysm, without rupture, unspecified: Secondary | ICD-10-CM

## 2020-01-09 ENCOUNTER — Ambulatory Visit (INDEPENDENT_AMBULATORY_CARE_PROVIDER_SITE_OTHER): Payer: PPO | Admitting: Gastroenterology

## 2020-01-09 ENCOUNTER — Other Ambulatory Visit: Payer: Self-pay

## 2020-01-09 ENCOUNTER — Encounter: Payer: Self-pay | Admitting: Gastroenterology

## 2020-01-09 VITALS — BP 126/80 | HR 86 | Temp 98.5°F | Ht 62.0 in | Wt 115.0 lb

## 2020-01-09 DIAGNOSIS — K5900 Constipation, unspecified: Secondary | ICD-10-CM

## 2020-01-09 DIAGNOSIS — R634 Abnormal weight loss: Secondary | ICD-10-CM

## 2020-01-09 DIAGNOSIS — R112 Nausea with vomiting, unspecified: Secondary | ICD-10-CM

## 2020-01-09 DIAGNOSIS — R933 Abnormal findings on diagnostic imaging of other parts of digestive tract: Secondary | ICD-10-CM | POA: Diagnosis not present

## 2020-01-09 MED ORDER — NA SULFATE-K SULFATE-MG SULF 17.5-3.13-1.6 GM/177ML PO SOLN: 1.0000 | Freq: Once | ORAL | 0 refills | Status: AC

## 2020-01-09 NOTE — Patient Instructions (Signed)
Continue yogurt to help with bowels. You can also try over the counter Miralax mixing 17 grams in 8 oz of water daily.   You have been scheduled for an endoscopy and colonoscopy. Please follow the written instructions given to you at your visit today. Please pick up your prep supplies at the pharmacy within the next 1-3 days. If you use inhalers (even only as needed), please bring them with you on the day of your procedure.  Normal BMI (Body Mass Index- based on height and weight) is between 23 and 30. Your BMI today is Body mass index is 21.03 kg/m. Marland Kitchen Please consider follow up  regarding your BMI with your Primary Care Provider.  Thank you for choosing me and [facility].  Venita Lick. Pleas Koch., MD., Clementeen Graham

## 2020-01-09 NOTE — Progress Notes (Addendum)
History of Present Illness: This is a 69 year old female referred by Lonie Peak, PA-C for the evaluation of an abnormal CT of the IC valve and TI, weight loss, frequent vomiting. She is accompanied by her husband.  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  She relates difficulties with frequent nocturnal regurgitation, nausea and vomiting in addition to persistent problems with a poor appetite.  She relates about a 60 pound weight loss since early 2019.  Both her and her husband feel her appetite has slightly improved and her weight has stabilized over the past several weeks although her nocturnal vomiting persists.  She has had difficulties with chronic constipation with significant straining at times.  Over the past several weeks she has been taking yogurt twice daily which has led to more regular bowel movements.  She was evaluated by Dr. Gaynelle Adu for an incisional hernia and a repeat CT AP was ordered.  She has intermittent pain around her hernia.  She has not previously undergone colonoscopy. Denies diarrhea, change in stool caliber, melena, hematochezia, dysphagia, chest pain.     Allergies  Allergen Reactions  . Celebrex [Celecoxib] Swelling    Throat swelling  . Sulfa Antibiotics Swelling    Throat swelling   Outpatient Medications Prior to Visit  Medication Sig Dispense Refill  . ALPRAZolam (XANAX) 1 MG tablet Take 0.5 tablets (0.5 mg total) by mouth 2 (two) times daily. 30 tablet 0  . amLODipine (NORVASC) 10 MG tablet Take 10 mg by mouth daily.  0  . gabapentin (NEURONTIN) 600 MG tablet Take 1 tablet  (600 mg total) by mouth 3 (three) times daily. 30 tablet 0  . methocarbamol (ROBAXIN) 500 MG tablet Take 1 tablet (500 mg total) by mouth 3 (three) times daily as needed for muscle spasms. 60 tablet 0  . metoprolol-hydrochlorothiazide (LOPRESSOR HCT) 50-25 MG tablet Take 1 tablet by mouth daily.    . ondansetron (ZOFRAN) 4 MG tablet Take 1 tablet (4 mg total) by mouth every 8 (eight) hours as needed for nausea or vomiting. 20 tablet 0  . oxyCODONE-acetaminophen (PERCOCET) 10-325 MG tablet Take 1 tablet by mouth every 4 (four) hours as needed for pain. 60 tablet 0  . pravastatin (PRAVACHOL) 40 MG tablet Take 40 mg by mouth daily.  5  . hydrOXYzine (VISTARIL) 50 MG capsule Take 50 mg by mouth 3 (three) times daily as needed for anxiety.  0  . metoprolol tartrate (LOPRESSOR) 25 MG tablet Take 25 mg by mouth daily.  4  . QUEtiapine (SEROQUEL) 25 MG tablet Take 1 tablet (25 mg total) by mouth 3 (three) times daily. (Patient not taking: Reported on 07/01/2017) 90 tablet 0  . triamterene-hydrochlorothiazide (MAXZIDE-25) 37.5-25 MG per tablet Take 1.5 tablets by mouth every morning.      No facility-administered medications prior to visit.   Past Medical History:  Diagnosis Date  . AAA (abdominal aortic aneurysm) (HCC)   . Anxiety   . Arthritis   . Back pain   . Cervical disc herniation   . Depression   . 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 Hypercholesteremia   .  Hypertension   . Panic disorder   . Stenosis, spinal, lumbar    Past Surgical History:  Procedure Laterality Date  . APPENDECTOMY    . BACK SURGERY    . CARPAL TUNNEL RELEASE    . LUMBAR LAMINECTOMY/DECOMPRESSION MICRODISCECTOMY N/A 04/12/2014   Procedure: MICRO LUMBAR DECOMPRESSION LUMBAR THREE TO FOUR,LUMBAR  FOUR TO FIVE;  Surgeon: Javier Docker, MD;  Location: [facility];  Service: Orthopedics;  Laterality: N/A;  . MAXOFACIAL SURGERY    . SPINAL CORD STIMULATOR INSERTION N/A 12/06/2015   Procedure: LUMBAR SPINAL CORD STIMULATOR  INSERTION;  Surgeon: Venita Lick, MD;  Location: [facility];  Service: Orthopedics;  Laterality: N/A;   Social History   Socioeconomic History  . Marital status: Married    Spouse name: Not on file  . Number of children: Not on file  . Years of education: Not on file  . Highest education level: Not on file  Occupational History  . Not on file  Tobacco Use  . Smoking status: Current Every Day Smoker    Packs/day: 0.50    Years: 40.00    Pack years: 20.00  Substance and Sexual Activity  . Alcohol use: Yes    Comment: OCCASIONAL  . Drug use: No  . Sexual activity: Not on file  Other Topics Concern  . Not on file  Social History Narrative  . Not on file   Social Determinants of Health   Financial Resource Strain:   . Difficulty of Paying Living Expenses: Not on file  Food Insecurity:   . Worried About Programme researcher, broadcasting/film/video in the Last Year: Not on file  . Ran Out of Food in the Last Year: Not on file  Transportation Needs:   . Lack of Transportation (Medical): Not on file  . Lack of Transportation (Non-Medical): Not on file  Physical Activity:   . Days of Exercise per Week: Not on file  . Minutes of Exercise per Session: Not on file  Stress:   . Feeling of Stress : Not on file  Social Connections:   . Frequency of Communication with Friends and Family: Not on file  . Frequency of Social Gatherings with Friends and Family: Not on file  . Attends Religious Services: Not on file  . Active Member of Clubs or Organizations: Not on file  . Attends Banker Meetings: Not on file  . Marital Status: Not on file   Family History  Problem Relation Age of Onset  . Cancer Sister       Review of Systems: Pertinent positive and negative review of systems were noted in the above HPI section. All other review of systems were otherwise negative.    Physical Exam: General: Well developed, well nourished, no acute distress Head: Normocephalic and atraumatic Eyes:   sclerae anicteric, EOMI Ears: Normal auditory acuity Mouth: Not examined, mask on Neck: Supple, no masses or thyromegaly Lungs: Clear throughout to auscultation Heart: Regular rate and rhythm; no murmurs, rubs or bruits Abdomen: Soft, and non distended. No masses, hepatosplenomegaly noted. Umbilical hernia is mildly tender and easily reducible. Normal Bowel sounds Rectal: Deferred to colonoscopy  Musculoskeletal: Symmetrical with no gross deformities  Skin: No lesions on visible extremities Pulses:  Normal pulses noted Extremities: No clubbing, cyanosis, edema or deformities noted Neurological: Alert oriented x 4, grossly nonfocal Cervical Nodes:  No significant cervical adenopathy Inguinal Nodes: No significant inguinal adenopathy Psychological:  Alert and cooperative. Normal mood and affect   Assessment and Recommendations:  1. Abnormal CT of the IC valve and TI, wall thickening versus under distention. Chronic constipation.  Constipation could be caused by or exacerbated by chronic opioid use.  Repeat CT AP as ordered by Dr. Redmond Pulling.  Maintain yogurt once or twice daily and if this is not adequate for treatment of constipation begin MiraLAX once or twice daily.  Schedule colonoscopy. The risks (including bleeding, perforation, infection, missed lesions, medication reactions and possible hospitalization or surgery if complications occur), benefits, and alternatives to colonoscopy with possible biopsy and possible polypectomy were discussed with the patient and they consent to proceed.   2. Nausea, vomiting, anorexia, weight loss. R/O GOO, ulcer, GERD, gastroparesis.  Schedule EGD. The risks (including bleeding, perforation, infection, missed lesions, medication reactions and possible hospitalization or surgery if complications occur), benefits, and alternatives to endoscopy with possible biopsy and possible dilation were discussed with the patient and they consent to proceed.   3. Umbilical,  incisional hernia. Follow up with Dr. Greer Pickerel.   4.  Descending thoracic aorta aneurysm.  Referral to vascular surgery for further evaluation and follow-up.   cc: Cyndi Bender, PA-C [address] [place],  [place] [address]

## 2020-01-11 ENCOUNTER — Ambulatory Visit
Admission: RE | Admit: 2020-01-11 | Discharge: 2020-01-11 | Disposition: A | Discharge status: Home / Self Care | Payer: PPO | Source: Ambulatory Visit | Attending: Physician Assistant | Admitting: Physician Assistant

## 2020-01-11 ENCOUNTER — Other Ambulatory Visit: Payer: Self-pay

## 2020-01-11 DIAGNOSIS — I712 Thoracic aortic aneurysm, without rupture, unspecified: Secondary | ICD-10-CM

## 2020-01-11 MED ORDER — IOPAMIDOL (ISOVUE-370) INJECTION 76%
75.0000 mL | Freq: Once | INTRAVENOUS | Status: AC | PRN
  Administered 2020-01-11: 75 mL via INTRAVENOUS

## 2020-01-12 ENCOUNTER — Telehealth: Payer: Self-pay | Admitting: Gastroenterology

## 2020-01-12 NOTE — Telephone Encounter (Signed)
Patient aware.

## 2020-01-12 NOTE — Telephone Encounter (Signed)
Patient calling- she wants to let Dr. Russella Dar know that she went to [facility] imaging for a CT yesterday- she wanted to let him know so he can view it. She also wanted to let Dr. Russella Dar know another symptom she is having- states some days she doesn't feel like she has legs.

## 2020-01-12 NOTE — Telephone Encounter (Signed)
Patient advised that the CT did not visualize much of the abdomen.  She wanted Dr. Russella Dar to know that she has leg weakness.  She will come for the colon/endo on 3/22

## 2020-01-12 NOTE — Telephone Encounter (Signed)
Chest CT report from today reviewed. CT AP ordered by Dr. Andrey Campanile has not been completed.  Recommend she speaks with her PCP regarding today's chest CT and her leg weakness.

## 2020-02-06 ENCOUNTER — Other Ambulatory Visit: Payer: Self-pay

## 2020-02-06 ENCOUNTER — Ambulatory Visit (AMBULATORY_SURGERY_CENTER): Payer: PPO | Admitting: Gastroenterology

## 2020-02-06 VITALS — BP 163/116 | HR 94 | Temp 96.8°F | Resp 17 | Ht 62.0 in | Wt 115.0 lb

## 2020-02-06 DIAGNOSIS — K5289 Other specified noninfective gastroenteritis and colitis: Secondary | ICD-10-CM

## 2020-02-06 DIAGNOSIS — K633 Ulcer of intestine: Secondary | ICD-10-CM

## 2020-02-06 DIAGNOSIS — K3189 Other diseases of stomach and duodenum: Secondary | ICD-10-CM | POA: Diagnosis not present

## 2020-02-06 DIAGNOSIS — R933 Abnormal findings on diagnostic imaging of other parts of digestive tract: Secondary | ICD-10-CM

## 2020-02-06 DIAGNOSIS — K2100 Gastro-esophageal reflux disease with esophagitis, without bleeding: Secondary | ICD-10-CM

## 2020-02-06 DIAGNOSIS — K2980 Duodenitis without bleeding: Secondary | ICD-10-CM | POA: Diagnosis not present

## 2020-02-06 DIAGNOSIS — R109 Unspecified abdominal pain: Secondary | ICD-10-CM | POA: Diagnosis not present

## 2020-02-06 DIAGNOSIS — R112 Nausea with vomiting, unspecified: Secondary | ICD-10-CM | POA: Insufficient documentation

## 2020-02-06 DIAGNOSIS — K529 Noninfective gastroenteritis and colitis, unspecified: Secondary | ICD-10-CM | POA: Diagnosis not present

## 2020-02-06 DIAGNOSIS — K2970 Gastritis, unspecified, without bleeding: Secondary | ICD-10-CM

## 2020-02-06 DIAGNOSIS — Z1211 Encounter for screening for malignant neoplasm of colon: Secondary | ICD-10-CM | POA: Diagnosis not present

## 2020-02-06 DIAGNOSIS — R634 Abnormal weight loss: Secondary | ICD-10-CM

## 2020-02-06 MED ORDER — PANTOPRAZOLE SODIUM 40 MG PO TBEC: 40.0000 mg | DELAYED_RELEASE_TABLET | Freq: Two times a day (BID) | ORAL | 11 refills | Status: DC

## 2020-02-06 MED ORDER — SODIUM CHLORIDE 0.9 % IV SOLN: 500.0000 mL | Freq: Once | INTRAVENOUS | Status: DC

## 2020-02-06 NOTE — Patient Instructions (Signed)
Impression/Recommendations:  Gastritis handout given to patient. Esophagitis handout given to patient.  Resume previous diet. Antireflux measure long term. Continue present medications.  Await pathology results.  Protonix (Pantoprazole) 40 mg. By mouth 2 times daily.  Return to GI office in 6 weeks.  Repeat colonoscopy in 10 years for screening purposes.  YOU HAD AN ENDOSCOPIC PROCEDURE TODAY AT THE [facility]:   Refer to the procedure report that was given to you for any specific questions about what was found during the examination.  If the procedure report does not answer your questions, please call your gastroenterologist to clarify.  If you requested that your care partner not be given the details of your procedure findings, then the procedure report has been included in a sealed envelope for you to review at your convenience later.  YOU SHOULD EXPECT: Some feelings of bloating in the abdomen. Passage of more gas than usual.  Walking can help get rid of the air that was put into your GI tract during the procedure and reduce the bloating. If you had a lower endoscopy (such as a colonoscopy or flexible sigmoidoscopy) you may notice spotting of blood in your stool or on the toilet paper. If you underwent a bowel prep for your procedure, you may not have a normal bowel movement for a few days.  Please Note:  You might notice some irritation and congestion in your nose or some drainage.  This is from the oxygen used during your procedure.  There is no need for concern and it should clear up in a day or so.  SYMPTOMS TO REPORT IMMEDIATELY:   Following lower endoscopy (colonoscopy or flexible sigmoidoscopy):  Excessive amounts of blood in the stool  Significant tenderness or worsening of abdominal pains  Swelling of the abdomen that is new, acute  Fever of 100F or higher   Following upper endoscopy (EGD)  Vomiting of blood or coffee ground material  New chest pain or  pain under the shoulder blades  Painful or persistently difficult swallowing  New shortness of breath  Fever of 100F or higher  Black, tarry-looking stools  For urgent or emergent issues, a gastroenterologist can be reached at any hour by calling [phone]. Do not use MyChart messaging for urgent concerns.    DIET:  We do recommend a small meal at first, but then you may proceed to your regular diet.  Drink plenty of fluids but you should avoid alcoholic beverages for 24 hours.  ACTIVITY:  You should plan to take it easy for the rest of today and you should NOT DRIVE or use heavy machinery until tomorrow (because of the sedation medicines used during the test).    FOLLOW UP: Our staff will call the number listed on your records 48-72 hours following your procedure to check on you and address any questions or concerns that you may have regarding the information given to you following your procedure. If we do not reach you, we will leave a message.  We will attempt to reach you two times.  During this call, we will ask if you have developed any symptoms of COVID 19. If you develop any symptoms (ie: fever, flu-like symptoms, shortness of breath, cough etc.) before then, please call [phone].  If you test positive for Covid 19 in the 2 weeks post procedure, please call and report this information to [place].    If any biopsies were taken you will be contacted by phone or by letter within the  next 1-3 weeks.  Please call us at [phone] if you have not heard about the biopsies in 3 weeks.    SIGNATURES/CONFIDENTIALITY: You and/or your care partner have signed paperwork which will be entered into your electronic medical record.  These signatures attest to the fact that that the information above on your After Visit Summary has been reviewed and is understood.  Full responsibility of the confidentiality of this discharge information lies with you and/or your care-partner.

## 2020-02-06 NOTE — Op Note (Signed)
[facility] Patient Name: Dawn Beasley Procedure Date: 02/06/2020 2:12 PM MRN: [id number] Endoscopist: Ladene Artist , MD Age: 69 Referring MD:  Date of Birth: [birth year] Gender: Female Account #: [id number] Procedure:                Upper GI endoscopy Indications:              Gastroesophageal reflux disease, Nausea with                            vomiting, Weight loss Medicines:                Monitored Anesthesia Care Procedure:                Pre-Anesthesia Assessment:                           - Prior to the procedure, a History and Physical                            was performed, and patient medications and                            allergies were reviewed. The patient's tolerance of                            previous anesthesia was also reviewed. The risks                            and benefits of the procedure and the sedation                            options and risks were discussed with the patient.                            All questions were answered, and informed consent                            was obtained. Prior Anticoagulants: The patient has                            taken no previous anticoagulant or antiplatelet                            agents. ASA Grade Assessment: II - A patient with                            mild systemic disease. After reviewing the risks                            and benefits, the patient was deemed in                            satisfactory condition to undergo the procedure.  After obtaining informed consent, the endoscope was                            passed under direct vision. Throughout the                            procedure, the patient's blood pressure, pulse, and                            oxygen saturations were monitored continuously. The                            Endoscope was introduced through the mouth, and                            advanced to the second part of duodenum.  The upper                            GI endoscopy was accomplished without difficulty.                            The patient tolerated the procedure well. Scope In: Scope Out: Findings:                 LA Grade A (one or more mucosal breaks less than 5                            mm, not extending between tops of 2 mucosal folds)                            esophagitis with no bleeding was found at the                            gastroesophageal junction.                           The exam of the esophagus was otherwise normal.                           Diffuse moderate inflammation characterized by                            erythema, friability and granularity was found in                            the entire examined stomach. Biopsies were taken                            with a cold forceps for histology.                           The exam of the stomach was otherwise normal.  Patchy mildly erythematous mucosa without active                            bleeding and with no stigmata of bleeding was found                            in the duodenal bulb and in the second portion of                            the duodenum. Biopsies were taken with a cold                            forceps for histology. Complications:            No immediate complications. Estimated Blood Loss:     Estimated blood loss was minimal. Impression:               - LA Grade A reflux esophagitis with no bleeding.                           - Gastritis. Biopsied.                           - Erythematous duodenopathy. Biopsied. Recommendation:           - Patient has a contact number available for                            emergencies. The signs and symptoms of potential                            delayed complications were discussed with the                            patient. Return to normal activities tomorrow.                            Written discharge instructions were provided to  the                            patient.                           - Resume previous diet.                           - Antireflux measures long term.                           - Continue present medications.                           - Await pathology results.                           - Protonix (pantoprazole) 40 mg PO bid, 1 year of  refills.                           - Return to GI office in 6 weeks. Meryl Dare, MD 02/06/2020 2:54:41 PM This report has been signed electronically.

## 2020-02-06 NOTE — Progress Notes (Signed)
Called to room to assist during endoscopic procedure.  Patient ID and intended procedure confirmed with present staff. Received instructions for my participation in the procedure from the performing physician.  

## 2020-02-06 NOTE — Progress Notes (Signed)
A and O x3. Report to RN. Tolerated MAC anesthesia well.Teeth unchanged after procedure.

## 2020-02-06 NOTE — Progress Notes (Signed)
Pt's states no medical or surgical changes since previsit or office visit. 

## 2020-02-06 NOTE — Op Note (Signed)
[facility] Patient Name: Dawn Beasley Procedure Date: 02/06/2020 2:12 PM MRN: [id number] Endoscopist: Meryl Dare , MD Age: 69 Referring MD:  Date of Birth: [birth year] Gender: Female Account #: [id number] Procedure:                Colonoscopy Indications:              Abnormal CT of the GI tract, Weight loss Medicines:                Monitored Anesthesia Care Procedure:                Pre-Anesthesia Assessment:                           - Prior to the procedure, a History and Physical                            was performed, and patient medications and                            allergies were reviewed. The patient's tolerance of                            previous anesthesia was also reviewed. The risks                            and benefits of the procedure and the sedation                            options and risks were discussed with the patient.                            All questions were answered, and informed consent                            was obtained. Prior Anticoagulants: The patient has                            taken no previous anticoagulant or antiplatelet                            agents. ASA Grade Assessment: II - A patient with                            mild systemic disease. After reviewing the risks                            and benefits, the patient was deemed in                            satisfactory condition to undergo the procedure.                           After obtaining informed consent, the colonoscope  was passed under direct vision. Throughout the                            procedure, the patient's blood pressure, pulse, and                            oxygen saturations were monitored continuously. The                            Colonoscope was introduced through the anus and                            advanced to the the terminal ileum, with                            identification of the  appendiceal orifice and IC                            valve. The terminal ileum, ileocecal valve,                            appendiceal orifice, and rectum were photographed.                            The quality of the bowel preparation was good. The                            colonoscopy was performed without difficulty. The                            patient tolerated the procedure well. Scope In: 2:19:30 PM Scope Out: 2:38:23 PM Scope Withdrawal Time: 0 hours 15 minutes 48 seconds  Total Procedure Duration: 0 hours 18 minutes 53 seconds  Findings:                 The perianal and digital rectal examinations were                            normal.                           The terminal ileum appeared normal.                           A single localized non-bleeding erosion was found                            in the cecum. No stigmata of recent bleeding were                            seen. Biopsies were taken with a cold forceps for                            histology.  Two localized non-bleeding erosions were found in                            the sigmoid colon. No stigmata of recent bleeding                            were seen. Biopsies were taken with a cold forceps                            for histology.                           The exam was otherwise without abnormality on                            direct and retroflexion views. Complications:            No immediate complications. Estimated blood loss:                            None. Estimated Blood Loss:     Estimated blood loss: none. Impression:               - The examined portion of the ileum was normal.                           - A single erosion in the cecum. Biopsied.                           - Two erosions in the sigmoid colon. Biopsied.                           - The examination was otherwise normal on direct                            and retroflexion views. Recommendation:            - Repeat colonoscopy in 10 years for screening                            purposes.                           - Patient has a contact number available for                            emergencies. The signs and symptoms of potential                            delayed complications were discussed with the                            patient. Return to normal activities tomorrow.                            Written discharge instructions were provided to the  patient.                           - Resume previous diet.                           - Continue present medications.                           - Await pathology results. Meryl Dare, MD 02/06/2020 2:51:17 PM This report has been signed electronically.

## 2020-02-08 ENCOUNTER — Telehealth: Payer: Self-pay | Admitting: *Deleted

## 2020-02-08 NOTE — Telephone Encounter (Signed)
Called patient back after talking to the MD. Had to leave her a message as she did not answer. Since her stomach is feeling better hopefully the vomiting will subside, but MD wants her to stay on a liquid diet today and advance slowly tomorrow to a soft diet. Also advised her to go to the ER if she continues to vomit coffee ground material.

## 2020-02-08 NOTE — Telephone Encounter (Signed)
Message left

## 2020-02-11 ENCOUNTER — Other Ambulatory Visit: Payer: Self-pay

## 2020-02-11 ENCOUNTER — Emergency Department (HOSPITAL_COMMUNITY): Payer: PPO

## 2020-02-11 ENCOUNTER — Inpatient Hospital Stay (HOSPITAL_COMMUNITY)
Admission: EM | Admit: 2020-02-11 | Discharge: 2020-03-02 | DRG: 871 | Disposition: A | Discharge status: Rehabilitation Facility | Payer: PPO | Attending: Internal Medicine | Admitting: Internal Medicine

## 2020-02-11 ENCOUNTER — Encounter (HOSPITAL_COMMUNITY): Payer: Self-pay

## 2020-02-11 DIAGNOSIS — N1330 Unspecified hydronephrosis: Secondary | ICD-10-CM | POA: Diagnosis present

## 2020-02-11 DIAGNOSIS — R6521 Severe sepsis with septic shock: Secondary | ICD-10-CM | POA: Diagnosis present

## 2020-02-11 DIAGNOSIS — Z1389 Encounter for screening for other disorder: Secondary | ICD-10-CM

## 2020-02-11 DIAGNOSIS — M1990 Unspecified osteoarthritis, unspecified site: Secondary | ICD-10-CM | POA: Diagnosis present

## 2020-02-11 DIAGNOSIS — J9601 Acute respiratory failure with hypoxia: Secondary | ICD-10-CM

## 2020-02-11 DIAGNOSIS — Z86711 Personal history of pulmonary embolism: Secondary | ICD-10-CM | POA: Diagnosis not present

## 2020-02-11 DIAGNOSIS — A419 Sepsis, unspecified organism: Secondary | ICD-10-CM | POA: Diagnosis present

## 2020-02-11 DIAGNOSIS — F13239 Sedative, hypnotic or anxiolytic dependence with withdrawal, unspecified: Secondary | ICD-10-CM | POA: Diagnosis present

## 2020-02-11 DIAGNOSIS — I082 Rheumatic disorders of both aortic and tricuspid valves: Secondary | ICD-10-CM | POA: Diagnosis present

## 2020-02-11 DIAGNOSIS — Z79899 Other long term (current) drug therapy: Secondary | ICD-10-CM

## 2020-02-11 DIAGNOSIS — J9690 Respiratory failure, unspecified, unspecified whether with hypoxia or hypercapnia: Secondary | ICD-10-CM | POA: Diagnosis present

## 2020-02-11 DIAGNOSIS — E7800 Pure hypercholesterolemia, unspecified: Secondary | ICD-10-CM | POA: Diagnosis not present

## 2020-02-11 DIAGNOSIS — A0472 Enterocolitis due to Clostridium difficile, not specified as recurrent: Secondary | ICD-10-CM | POA: Diagnosis not present

## 2020-02-11 DIAGNOSIS — F329 Major depressive disorder, single episode, unspecified: Secondary | ICD-10-CM | POA: Diagnosis present

## 2020-02-11 DIAGNOSIS — R918 Other nonspecific abnormal finding of lung field: Secondary | ICD-10-CM | POA: Diagnosis not present

## 2020-02-11 DIAGNOSIS — J9621 Acute and chronic respiratory failure with hypoxia: Secondary | ICD-10-CM | POA: Diagnosis not present

## 2020-02-11 DIAGNOSIS — E162 Hypoglycemia, unspecified: Secondary | ICD-10-CM | POA: Diagnosis present

## 2020-02-11 DIAGNOSIS — F05 Delirium due to known physiological condition: Secondary | ICD-10-CM | POA: Diagnosis present

## 2020-02-11 DIAGNOSIS — F17210 Nicotine dependence, cigarettes, uncomplicated: Secondary | ICD-10-CM | POA: Diagnosis present

## 2020-02-11 DIAGNOSIS — R778 Other specified abnormalities of plasma proteins: Secondary | ICD-10-CM | POA: Diagnosis present

## 2020-02-11 DIAGNOSIS — D539 Nutritional anemia, unspecified: Secondary | ICD-10-CM | POA: Diagnosis present

## 2020-02-11 DIAGNOSIS — Z886 Allergy status to analgesic agent status: Secondary | ICD-10-CM

## 2020-02-11 DIAGNOSIS — E785 Hyperlipidemia, unspecified: Secondary | ICD-10-CM | POA: Diagnosis not present

## 2020-02-11 DIAGNOSIS — R1110 Vomiting, unspecified: Secondary | ICD-10-CM

## 2020-02-11 DIAGNOSIS — R4781 Slurred speech: Secondary | ICD-10-CM | POA: Diagnosis not present

## 2020-02-11 DIAGNOSIS — R55 Syncope and collapse: Secondary | ICD-10-CM

## 2020-02-11 DIAGNOSIS — R410 Disorientation, unspecified: Secondary | ICD-10-CM | POA: Diagnosis present

## 2020-02-11 DIAGNOSIS — E860 Dehydration: Secondary | ICD-10-CM | POA: Diagnosis not present

## 2020-02-11 DIAGNOSIS — R339 Retention of urine, unspecified: Secondary | ICD-10-CM | POA: Diagnosis not present

## 2020-02-11 DIAGNOSIS — R0602 Shortness of breath: Secondary | ICD-10-CM | POA: Diagnosis not present

## 2020-02-11 DIAGNOSIS — I712 Thoracic aortic aneurysm, without rupture: Secondary | ICD-10-CM | POA: Diagnosis present

## 2020-02-11 DIAGNOSIS — G92 Toxic encephalopathy: Secondary | ICD-10-CM | POA: Diagnosis present

## 2020-02-11 DIAGNOSIS — J189 Pneumonia, unspecified organism: Secondary | ICD-10-CM | POA: Diagnosis not present

## 2020-02-11 DIAGNOSIS — I248 Other forms of acute ischemic heart disease: Secondary | ICD-10-CM | POA: Diagnosis present

## 2020-02-11 DIAGNOSIS — D509 Iron deficiency anemia, unspecified: Secondary | ICD-10-CM | POA: Diagnosis present

## 2020-02-11 DIAGNOSIS — K2100 Gastro-esophageal reflux disease with esophagitis, without bleeding: Secondary | ICD-10-CM | POA: Diagnosis present

## 2020-02-11 DIAGNOSIS — I5033 Acute on chronic diastolic (congestive) heart failure: Secondary | ICD-10-CM | POA: Diagnosis present

## 2020-02-11 DIAGNOSIS — J9622 Acute and chronic respiratory failure with hypercapnia: Secondary | ICD-10-CM | POA: Diagnosis present

## 2020-02-11 DIAGNOSIS — K219 Gastro-esophageal reflux disease without esophagitis: Secondary | ICD-10-CM | POA: Diagnosis not present

## 2020-02-11 DIAGNOSIS — Z6822 Body mass index (BMI) 22.0-22.9, adult: Secondary | ICD-10-CM

## 2020-02-11 DIAGNOSIS — R140 Abdominal distension (gaseous): Secondary | ICD-10-CM

## 2020-02-11 DIAGNOSIS — R5381 Other malaise: Secondary | ICD-10-CM | POA: Diagnosis not present

## 2020-02-11 DIAGNOSIS — E43 Unspecified severe protein-calorie malnutrition: Secondary | ICD-10-CM | POA: Diagnosis present

## 2020-02-11 DIAGNOSIS — J9811 Atelectasis: Secondary | ICD-10-CM | POA: Diagnosis present

## 2020-02-11 DIAGNOSIS — R197 Diarrhea, unspecified: Secondary | ICD-10-CM | POA: Diagnosis not present

## 2020-02-11 DIAGNOSIS — N280 Ischemia and infarction of kidney: Secondary | ICD-10-CM | POA: Diagnosis present

## 2020-02-11 DIAGNOSIS — N170 Acute kidney failure with tubular necrosis: Secondary | ICD-10-CM | POA: Diagnosis present

## 2020-02-11 DIAGNOSIS — N179 Acute kidney failure, unspecified: Secondary | ICD-10-CM | POA: Diagnosis not present

## 2020-02-11 DIAGNOSIS — F17200 Nicotine dependence, unspecified, uncomplicated: Secondary | ICD-10-CM | POA: Diagnosis not present

## 2020-02-11 DIAGNOSIS — N319 Neuromuscular dysfunction of bladder, unspecified: Secondary | ICD-10-CM | POA: Diagnosis not present

## 2020-02-11 DIAGNOSIS — K920 Hematemesis: Secondary | ICD-10-CM | POA: Diagnosis present

## 2020-02-11 DIAGNOSIS — I2694 Multiple subsegmental pulmonary emboli without acute cor pulmonale: Secondary | ICD-10-CM | POA: Diagnosis not present

## 2020-02-11 DIAGNOSIS — Z20822 Contact with and (suspected) exposure to covid-19: Secondary | ICD-10-CM | POA: Diagnosis present

## 2020-02-11 DIAGNOSIS — G8929 Other chronic pain: Secondary | ICD-10-CM | POA: Diagnosis present

## 2020-02-11 DIAGNOSIS — E871 Hypo-osmolality and hyponatremia: Secondary | ICD-10-CM

## 2020-02-11 DIAGNOSIS — D638 Anemia in other chronic diseases classified elsewhere: Secondary | ICD-10-CM | POA: Diagnosis present

## 2020-02-11 DIAGNOSIS — J439 Emphysema, unspecified: Secondary | ICD-10-CM | POA: Diagnosis present

## 2020-02-11 DIAGNOSIS — I10 Essential (primary) hypertension: Secondary | ICD-10-CM | POA: Diagnosis not present

## 2020-02-11 DIAGNOSIS — L89152 Pressure ulcer of sacral region, stage 2: Secondary | ICD-10-CM | POA: Diagnosis present

## 2020-02-11 DIAGNOSIS — E876 Hypokalemia: Secondary | ICD-10-CM | POA: Diagnosis not present

## 2020-02-11 DIAGNOSIS — R159 Full incontinence of feces: Secondary | ICD-10-CM | POA: Diagnosis not present

## 2020-02-11 DIAGNOSIS — F410 Panic disorder [episodic paroxysmal anxiety] without agoraphobia: Secondary | ICD-10-CM | POA: Diagnosis present

## 2020-02-11 DIAGNOSIS — J690 Pneumonitis due to inhalation of food and vomit: Secondary | ICD-10-CM | POA: Diagnosis present

## 2020-02-11 DIAGNOSIS — D61818 Other pancytopenia: Secondary | ICD-10-CM

## 2020-02-11 DIAGNOSIS — G9341 Metabolic encephalopathy: Secondary | ICD-10-CM | POA: Diagnosis not present

## 2020-02-11 DIAGNOSIS — K592 Neurogenic bowel, not elsewhere classified: Secondary | ICD-10-CM | POA: Diagnosis not present

## 2020-02-11 DIAGNOSIS — G9340 Encephalopathy, unspecified: Secondary | ICD-10-CM | POA: Diagnosis not present

## 2020-02-11 DIAGNOSIS — F1010 Alcohol abuse, uncomplicated: Secondary | ICD-10-CM | POA: Diagnosis present

## 2020-02-11 DIAGNOSIS — R0902 Hypoxemia: Secondary | ICD-10-CM | POA: Diagnosis not present

## 2020-02-11 DIAGNOSIS — M545 Low back pain: Secondary | ICD-10-CM | POA: Diagnosis present

## 2020-02-11 DIAGNOSIS — Z09 Encounter for follow-up examination after completed treatment for conditions other than malignant neoplasm: Secondary | ICD-10-CM

## 2020-02-11 DIAGNOSIS — E875 Hyperkalemia: Secondary | ICD-10-CM | POA: Diagnosis present

## 2020-02-11 DIAGNOSIS — Z4682 Encounter for fitting and adjustment of non-vascular catheter: Secondary | ICD-10-CM | POA: Diagnosis not present

## 2020-02-11 DIAGNOSIS — Z0189 Encounter for other specified special examinations: Secondary | ICD-10-CM

## 2020-02-11 DIAGNOSIS — Z882 Allergy status to sulfonamides status: Secondary | ICD-10-CM

## 2020-02-11 DIAGNOSIS — E870 Hyperosmolality and hypernatremia: Secondary | ICD-10-CM | POA: Diagnosis not present

## 2020-02-11 DIAGNOSIS — M6282 Rhabdomyolysis: Secondary | ICD-10-CM | POA: Diagnosis present

## 2020-02-11 DIAGNOSIS — G894 Chronic pain syndrome: Secondary | ICD-10-CM | POA: Diagnosis not present

## 2020-02-11 DIAGNOSIS — R188 Other ascites: Secondary | ICD-10-CM | POA: Diagnosis not present

## 2020-02-11 DIAGNOSIS — J9600 Acute respiratory failure, unspecified whether with hypoxia or hypercapnia: Secondary | ICD-10-CM

## 2020-02-11 DIAGNOSIS — I110 Hypertensive heart disease with heart failure: Secondary | ICD-10-CM | POA: Diagnosis present

## 2020-02-11 DIAGNOSIS — J90 Pleural effusion, not elsewhere classified: Secondary | ICD-10-CM | POA: Diagnosis not present

## 2020-02-11 DIAGNOSIS — J9620 Acute and chronic respiratory failure, unspecified whether with hypoxia or hypercapnia: Secondary | ICD-10-CM

## 2020-02-11 DIAGNOSIS — G4700 Insomnia, unspecified: Secondary | ICD-10-CM | POA: Diagnosis present

## 2020-02-11 DIAGNOSIS — I493 Ventricular premature depolarization: Secondary | ICD-10-CM | POA: Diagnosis present

## 2020-02-11 DIAGNOSIS — R490 Dysphonia: Secondary | ICD-10-CM | POA: Diagnosis not present

## 2020-02-11 DIAGNOSIS — I2699 Other pulmonary embolism without acute cor pulmonale: Secondary | ICD-10-CM | POA: Diagnosis not present

## 2020-02-11 DIAGNOSIS — N320 Bladder-neck obstruction: Secondary | ICD-10-CM | POA: Diagnosis present

## 2020-02-11 DIAGNOSIS — Z720 Tobacco use: Secondary | ICD-10-CM | POA: Diagnosis not present

## 2020-02-11 DIAGNOSIS — Y95 Nosocomial condition: Secondary | ICD-10-CM | POA: Diagnosis present

## 2020-02-11 DIAGNOSIS — R4020 Unspecified coma: Secondary | ICD-10-CM | POA: Diagnosis not present

## 2020-02-11 DIAGNOSIS — R0689 Other abnormalities of breathing: Secondary | ICD-10-CM | POA: Diagnosis not present

## 2020-02-11 LAB — RETICULOCYTES
Immature Retic Fract: 20 % — ABNORMAL HIGH (ref 2.3–15.9)
RBC.: 2.31 MIL/uL — ABNORMAL LOW (ref 3.87–5.11)
Retic Count, Absolute: 56.4 10*3/uL (ref 19.0–186.0)
Retic Ct Pct: 2.4 % (ref 0.4–3.1)

## 2020-02-11 LAB — CBC
HCT: 24.6 % — ABNORMAL LOW (ref 36.0–46.0)
Hemoglobin: 8.6 g/dL — ABNORMAL LOW (ref 12.0–15.0)
MCH: 39.4 pg — ABNORMAL HIGH (ref 26.0–34.0)
MCHC: 35 g/dL (ref 30.0–36.0)
MCV: 112.8 fL — ABNORMAL HIGH (ref 80.0–100.0)
Platelets: 141 10*3/uL — ABNORMAL LOW (ref 150–400)
RBC: 2.18 MIL/uL — ABNORMAL LOW (ref 3.87–5.11)
RDW: 12.1 % (ref 11.5–15.5)
WBC: 1.3 10*3/uL — CL (ref 4.0–10.5)
nRBC: 0 % (ref 0.0–0.2)

## 2020-02-11 LAB — TYPE AND SCREEN
ABO/RH(D): O NEG
Antibody Screen: NEGATIVE

## 2020-02-11 LAB — BASIC METABOLIC PANEL
Anion gap: 14 (ref 5–15)
BUN: 50 mg/dL — ABNORMAL HIGH (ref 8–23)
CO2: 26 mmol/L (ref 22–32)
Calcium: 8.5 mg/dL — ABNORMAL LOW (ref 8.9–10.3)
Chloride: 87 mmol/L — ABNORMAL LOW (ref 98–111)
Creatinine, Ser: 1.69 mg/dL — ABNORMAL HIGH (ref 0.44–1.00)
GFR calc Af Amer: 36 mL/min — ABNORMAL LOW (ref >60)
GFR calc non Af Amer: 31 mL/min — ABNORMAL LOW (ref >60)
Glucose, Bld: 99 mg/dL (ref 70–99)
Potassium: 3.9 mmol/L (ref 3.5–5.1)
Sodium: 127 mmol/L — ABNORMAL LOW (ref 135–145)

## 2020-02-11 LAB — DIFFERENTIAL
Abs Immature Granulocytes: 0 10*3/uL (ref 0.00–0.07)
Basophils Absolute: 0 10*3/uL (ref 0.0–0.1)
Basophils Relative: 1 %
Eosinophils Absolute: 0 10*3/uL (ref 0.0–0.5)
Eosinophils Relative: 0 %
Immature Granulocytes: 0 %
Lymphocytes Relative: 8 %
Lymphs Abs: 0.1 10*3/uL — ABNORMAL LOW (ref 0.7–4.0)
Monocytes Absolute: 0.1 10*3/uL (ref 0.1–1.0)
Monocytes Relative: 9 %
Neutro Abs: 1.2 10*3/uL — ABNORMAL LOW (ref 1.7–7.7)
Neutrophils Relative %: 82 %

## 2020-02-11 LAB — CBG MONITORING, ED: Glucose-Capillary: 100 mg/dL — ABNORMAL HIGH (ref 70–99)

## 2020-02-11 MED ORDER — LACTATED RINGERS IV BOLUS
2000.0000 mL | Freq: Once | INTRAVENOUS | Status: AC
  Administered 2020-02-11: 2000 mL via INTRAVENOUS

## 2020-02-11 MED ORDER — METRONIDAZOLE IN NACL 5-0.79 MG/ML-% IV SOLN
500.0000 mg | Freq: Once | INTRAVENOUS | Status: AC
  Administered 2020-02-12: 500 mg via INTRAVENOUS
  Filled 2020-02-11: qty 100

## 2020-02-11 MED ORDER — SODIUM CHLORIDE 0.9 % IV SOLN
2.0000 g | INTRAVENOUS | Status: DC
  Administered 2020-02-11: 2 g via INTRAVENOUS
  Filled 2020-02-11: qty 20

## 2020-02-11 MED ORDER — SODIUM CHLORIDE 0.9 % IV SOLN
80.0000 mg | Freq: Once | INTRAVENOUS | Status: AC
  Administered 2020-02-11: 80 mg via INTRAVENOUS
  Filled 2020-02-11: qty 80

## 2020-02-11 MED ORDER — SODIUM CHLORIDE 0.9 % IV SOLN
500.0000 mg | INTRAVENOUS | Status: DC
  Administered 2020-02-12: 500 mg via INTRAVENOUS
  Filled 2020-02-11: qty 500

## 2020-02-11 NOTE — ED Provider Notes (Addendum)
[facility] EMERGENCY DEPARTMENT Provider Note   CSN: [id number] Arrival date & time: 02/11/20  1942     History Chief Complaint  Patient presents with  . Near Syncope    Dawn PENNELLA is a 69 y.o. female.  HPI     69 year old female with history of AAA, hypertension, hyperlipidemia with recent colonoscopy comes in a chief complaint of weakness and dizziness.  Patient reports that she had upper and lower endoscopy on Monday. She has had hoarse voice and persistent nausea and vomiting since then. She has history of poor intake, nausea -which is why the endoscopies were completed. However after the procedure her symptoms have gotten worse. She has not had any emesis today, but post endoscopy she has had emesis that was coffee color. She denies any abdominal pain, back pain. She denies any new chest pain, shortness of breath, cough, fevers, chills, UTI-like symptoms. She has no new one-sided weakness, numbness, vision changes.  Pt has no hx of PE, DVT and denies any exogenous hormone (testosterone / estrogen) use, long distance travels or surgery in the past 6 weeks, active cancer, recent immobilization.  Patient smokes a pack or more a day, and has been since age 9.  Past Medical History:  Diagnosis Date  . AAA (abdominal aortic aneurysm) (HCC)   . Anxiety   . Arthritis   . Back pain   . Cervical disc herniation   . Depression   . 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 Hypercholesteremia   . Hypertension   . Panic disorder   . Stenosis, spinal, lumbar     Patient Active Problem List   Diagnosis Date Noted  . Pancytopenia (HCC) 02/12/2020  . Sepsis (HCC) 02/12/2020  . Aspiration pneumonia (HCC) 02/12/2020  . Abnormal CT scan, colon 02/06/2020  . Nausea and vomiting 02/06/2020  . Essential hypertension   . 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 Encephalopathy acute 12/23/2016  . Transient alteration of awareness 12/23/2016  . Acute hyponatremia   . Speech  disturbance   . Benzodiazepine withdrawal with complication (HCC)   . Other chronic pain 12/06/2015  . Lumbar spinal stenosis 04/12/2014    Past Surgical History:  Procedure Laterality Date  . APPENDECTOMY    . BACK SURGERY    . CARPAL TUNNEL RELEASE    . LUMBAR LAMINECTOMY/DECOMPRESSION MICRODISCECTOMY N/A 04/12/2014   Procedure: MICRO LUMBAR DECOMPRESSION LUMBAR THREE TO FOUR,LUMBAR  FOUR TO FIVE;  Surgeon: Javier Docker, MD;  Location: [facility] ORS;  Service: Orthopedics;  Laterality: N/A;  . MAXOFACIAL SURGERY    . SPINAL CORD STIMULATOR INSERTION N/A 12/06/2015   Procedure: LUMBAR SPINAL CORD STIMULATOR INSERTION;  Surgeon: Venita Lick, MD;  Location: [facility] OR;  Service: Orthopedics;  Laterality: N/A;     OB History   No obstetric history on file.     Family History  Problem Relation Age of Onset  . Cancer Sister     Social History   Tobacco Use  . Smoking status: Current Every Day Smoker    Packs/day: 0.50    Years: 40.00    Pack years: 20.00  Substance Use Topics  . Alcohol use: Yes    Comment: OCCASIONAL  . Drug use: No    Home Medications Prior to Admission medications   Medication Sig Start Date End Date Taking? Authorizing Provider  ALPRAZolam Prudy Feeler) 1 MG tablet Take 0.5 tablets (0.5 mg total) by mouth 2 (two) times daily. 12/25/16   Tillman Sers, DO  amLODipine (NORVASC) 10 MG tablet Take 10 mg by mouth daily. 11/28/16   [provider]  gabapentin (NEURONTIN) 600 MG tablet Take 1 tablet (600 mg total) by mouth 3 (three) times daily. 12/25/16   Tillman Sers, DO  methocarbamol (ROBAXIN) 500 MG tablet Take 1 tablet (500 mg total) by mouth 3 (three) times daily as needed for muscle spasms. 12/06/15   Venita Lick, MD  metoprolol-hydrochlorothiazide (LOPRESSOR HCT) 50-25 MG tablet Take 1 tablet by mouth daily.    [provider]  ondansetron (ZOFRAN) 4 MG tablet Take 1 tablet (4 mg total) by mouth every 8 (eight) hours as needed for nausea or  vomiting. 12/06/15   Venita Lick, MD  oxyCODONE-acetaminophen (PERCOCET) 10-325 MG tablet Take 1 tablet by mouth every 4 (four) hours as needed for pain. 12/06/15   Venita Lick, MD  pantoprazole (PROTONIX) 40 MG tablet Take 1 tablet (40 mg total) by mouth 2 (two) times daily. 02/06/20   Meryl Dare, MD  pravastatin (PRAVACHOL) 40 MG tablet Take 40 mg by mouth daily. 12/08/16   [provider]    Allergies    Celebrex [celecoxib] and Sulfa antibiotics  Review of Systems   Review of Systems  Constitutional: Positive for activity change.  HENT: Positive for voice change. Negative for sore throat.   Respiratory: Negative for cough and shortness of breath.   Cardiovascular: Negative for chest pain.  Gastrointestinal: Positive for nausea and vomiting. Negative for abdominal pain.  Genitourinary: Negative for dysuria.  Neurological: Positive for dizziness.  Hematological: Does not bruise/bleed easily.  All other systems reviewed and are negative.   Physical Exam Updated Vital Signs BP 118/86   Pulse (!) 134   Temp 98.6 F (37 C) (Oral)   Resp (!) 26   Ht 5\' 2"  (1.575 m)   Wt 52.2 kg   SpO2 94%   BMI 21.03 kg/m   Physical Exam Vitals and nursing note reviewed.  Constitutional:      Appearance: She is well-developed.  HENT:     Head: Normocephalic and atraumatic.  Eyes:     Extraocular Movements: Extraocular movements intact.     Pupils: Pupils are equal, round, and reactive to light.  Cardiovascular:     Rate and Rhythm: Normal rate.  Pulmonary:     Effort: Pulmonary effort is normal.     Breath sounds: Rhonchi present.     Comments: Coarse breath sounds, no rales or wheezing Abdominal:     General: Bowel sounds are normal.  Musculoskeletal:     Cervical back: Normal range of motion and neck supple.     Right lower leg: No edema.     Left lower leg: No edema.  Skin:    General: Skin is warm and dry.  Neurological:     Mental Status: She is alert and  oriented to person, place, and time.     Cranial Nerves: No cranial nerve deficit.     Sensory: No sensory deficit.     Motor: No weakness.     Coordination: Coordination normal.     Gait: Gait normal.     Deep Tendon Reflexes: Reflexes normal.     Comments: Normal finger-to-nose exam     ED Results / Procedures / Treatments   Labs (all labs ordered are listed, but only abnormal results are displayed) Labs Reviewed  BASIC METABOLIC PANEL - Abnormal; Notable for the following components:      Result Value   Sodium 127 (*)  Chloride 87 (*)    BUN 50 (*)    Creatinine, Ser 1.69 (*)    Calcium 8.5 (*)    GFR calc non Af Amer 31 (*)    GFR calc Af Amer 36 (*)    All other components within normal limits  CBC - Abnormal; Notable for the following components:   WBC 1.3 (*)    RBC 2.18 (*)    Hemoglobin 8.6 (*)    HCT 24.6 (*)    MCV 112.8 (*)    MCH 39.4 (*)    Platelets 141 (*)    All other components within normal limits  RETICULOCYTES - Abnormal; Notable for the following components:   RBC. 2.31 (*)    Immature Retic Fract 20.0 (*)    All other components within normal limits  DIFFERENTIAL - Abnormal; Notable for the following components:   Neutro Abs 1.2 (*)    Lymphs Abs 0.1 (*)    All other components within normal limits  CBG MONITORING, ED - Abnormal; Notable for the following components:   Glucose-Capillary 100 (*)    All other components within normal limits  SARS CORONAVIRUS 2 (TAT 6-24 HRS)  CULTURE, BLOOD (ROUTINE X 2)  CULTURE, BLOOD (ROUTINE X 2)  EXPECTORATED SPUTUM ASSESSMENT W REFEX TO RESP CULTURE  RESPIRATORY PANEL BY RT PCR (FLU A&B, COVID)  RESPIRATORY PANEL BY PCR  URINALYSIS, ROUTINE W REFLEX MICROSCOPIC  PATHOLOGIST SMEAR REVIEW  HIV ANTIBODY (ROUTINE TESTING W REFLEX)  LACTIC ACID, PLASMA  LACTIC ACID, PLASMA  PROCALCITONIN  STREP PNEUMONIAE URINARY ANTIGEN  URIC ACID  LACTATE DEHYDROGENASE  VITAMIN B12  C-REACTIVE PROTEIN  D-DIMER,  QUANTITATIVE (NOT AT [facility])  FERRITIN  FIBRINOGEN  COMPREHENSIVE METABOLIC PANEL  CBC WITH DIFFERENTIAL/PLATELET  COMPREHENSIVE METABOLIC PANEL  PHOSPHORUS  MAGNESIUM  FOLATE  IRON AND TIBC  RETICULOCYTES  APTT  PROTIME-INR  BRAIN NATRIURETIC PEPTIDE  CK  TYPE AND SCREEN  ABO/RH  TROPONIN I (HIGH SENSITIVITY)    EKG EKG Interpretation  Date/Time:  Saturday February 11 2020 19:52:39 EDT Ventricular Rate:  130 PR Interval:    QRS Duration: 97 QT Interval:  307 QTC Calculation: 452 R Axis:   95 Text Interpretation: Sinus tachycardia Probable left atrial enlargement Right axis deviation Anteroseptal infarct, age indeterminate Borderline T abnormalities, inferior leads No acute changes [id number] is new Confirmed by Derwood KaplanNanavati, Lorrin Bodner [phone]([id number]) on 02/11/2020 8:32:50 PM   Radiology CT Head Wo Contrast  Result Date: 02/11/2020 CLINICAL DATA:  Ataxia, witnessed syncope EXAM: CT HEAD WITHOUT CONTRAST TECHNIQUE: Contiguous axial images were obtained from the base of the skull through the vertex without intravenous contrast. COMPARISON:  12/23/2016 FINDINGS: Brain: Focal hypodensity within the left basal ganglia consistent with chronic infarct. Confluent hypodensities throughout the periventricular white matter are consistent with age-indeterminate small vessel ischemic changes, favor chronic. No other signs of acute infarct or hemorrhage. Lateral ventricles and midline structures are otherwise unremarkable. No acute extra-axial fluid collections. No mass effect. Vascular: No hyperdense vessel or unexpected calcification. Skull: Normal. Negative for fracture or focal lesion. Sinuses/Orbits: No acute displaced fracture. Other: None IMPRESSION: 1. Small-vessel ischemic changes throughout the periventricular white matter and left basal ganglia, likely chronic. 2. No acute hemorrhage. Electronically Signed   By: Sharlet SalinaMichael  Brown M.D.   On: 02/11/2020 22:35   DG Chest Port 1 View  Result Date:  02/11/2020 CLINICAL DATA:  Syncope, shortness of breath EXAM: PORTABLE CHEST 1 VIEW COMPARISON:  01/11/2020 FINDINGS: Single frontal view of the chest demonstrates  stable cardiac silhouette. Ectasia of the thoracic aorta unchanged. There is diffuse interstitial and ground-glass airspace disease throughout the right chest, with denser consolidation in the right infrahilar region. Trace right effusion. No pneumothorax. The left chest is clear. Spinal stimulator overlies the midthoracic spine. IMPRESSION: 1. Diffuse right-sided interstitial opacities and ground-glass airspace disease, with dense consolidation of the right lower lobe. Given history of syncope, aspiration could be considered. Asymmetric edema or diffuse right-sided pneumonia could also give this pattern. Electronically Signed   By: Randa Ngo M.D.   On: 02/11/2020 21:51    Procedures .Critical Care Performed by: Varney Biles, MD Authorized by: Varney Biles, MD   Critical care provider statement:    Critical care time (minutes):  86   Critical care was necessary to treat or prevent imminent or life-threatening deterioration of the following conditions:  Circulatory failure, respiratory failure and renal failure   Critical care was time spent personally by me on the following activities:  Discussions with consultants, evaluation of patient's response to treatment, examination of patient, ordering and performing treatments and interventions, ordering and review of laboratory studies, ordering and review of radiographic studies, pulse oximetry, re-evaluation of patient's condition, obtaining history from patient or surrogate and review of old charts   (including critical care time)  Medications Ordered in ED Medications  cefTRIAXone (ROCEPHIN) 2 g in sodium chloride 0.9 % 100 mL IVPB (0 g Intravenous Stopped 02/12/20 0003)  azithromycin (ZITHROMAX) 500 mg in sodium chloride 0.9 % 250 mL IVPB (has no administration in time range)   metroNIDAZOLE (FLAGYL) IVPB 500 mg (has no administration in time range)  pantoprazole (PROTONIX) 80 mg in sodium chloride 0.9 % 100 mL IVPB (0 mg Intravenous Stopped 02/12/20 0003)  lactated ringers bolus 2,000 mL (2,000 mLs Intravenous New Bag/Given 02/11/20 2204)    ED Course  I have reviewed the triage vital signs and the nursing notes.  Pertinent labs & imaging results that were available during my care of the patient were reviewed by me and considered in my medical decision making (see chart for details).  Clinical Course as of Feb 12 12  Sat Feb 11, 2020  2228 Patient has AKI, with hyponatremia and hypochloremia.  Fluid hydration started.  Creatinine(!): 1.69 [AN]  2228 Patient has pancytopenia. She denies any history of malignancy.  She is unsure if she has had anemia before. She is denying any bloody stools.  She just had upper and lower endoscopy that revealed gastritis, esophagitis.  We will give her IV Protonix.  She could have underlying infectious process driving this or there could be malignancy.  WBC(!!): 1.3 [AN]  2232 BUN to creatinine ratio is over 20.  Patient likely has prerenal AKI.  BUN(!): 50 [AN]  Sun Feb 12, 2020  0011 Discussed case with Dr. Blair Promise, oncology.  They recommend that patient be admitted to medicine service and they can be consulted if Covid is negative.  They request B12 to be added.  Uric acid and LDH also ordered.  Dr. Dorann Ou admitting   [AN]    Clinical Course User Index [AN] Varney Biles, MD   MDM Rules/Calculators/A&P                     69 year old female comes in a chief complaint of weakness and dizziness, with near syncope.  She is also complaining of change in voice ever since she had upper and lower endoscopy with worsening of her nausea.  On exam patient is noted  to be profoundly tachycardic.  She has no history of PE.  She is also noted to be slightly hypoxic and put on oxygen.  She denies any known lung disease and she is  not wheezing on her exam.  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  No large PE seen therefore suspicion for PE is low.  There was some coarse breath sounds and there could be questionable aspiration pneumonia.  She is fully vaccinated, but COVID-19 is also in the differential.  Other possibility includes orthostatic hypotension leading to near syncope.  Patient informs me that she has not been eating or drinking for the last few days.  I anticipate that she likely will have AKI and electrolyte abnormalities.  TENICIA GURAL was evaluated in Emergency Department on 02/12/2020 for the symptoms described in the history of present illness. She was evaluated in the context of the global COVID-19 pandemic, which necessitated consideration that the patient might be at risk for infection with the SARS-CoV-2 virus that causes COVID-19. Institutional protocols and algorithms that pertain to the evaluation of patients at risk for COVID-19 are in a state of rapid change based on information released by regulatory bodies including the CDC and federal and state organizations. These policies and algorithms were followed during the patient's care in the ED.   Final Clinical Impression(s) / ED Diagnoses Final diagnoses:  AKI (acute kidney injury) (HCC)  Hoarseness of voice  Near syncope  Pancytopenia (HCC)  Acute respiratory failure with hypoxia [facility])    Rx / DC Orders ED Discharge Orders    None          Derwood Kaplan, MD 02/12/20 [phone]

## 2020-02-11 NOTE — ED Triage Notes (Signed)
Pt BIB [facility] for complaint of witnessed syncopal episode per husband. Pt had endoscopy/colonoscopy 02/06/20, husband reports pt has not been eating/drinking regularly since. EMS stroke screen negative, given NS en route. BP originally soft, 94 palp, after , 102/80 O2 78%, pt placed on 4L [place]. VSS upon arrival, 100% RA

## 2020-02-11 NOTE — H&P (Signed)
Dawn Beasley WGN:[id number] DOB: 1951-06-23 DOA: 02/11/2020    PCP: Lonie Peak, PA-C   Outpatient Specialists:     GI  Dr.Stark  ( [facility])    Patient arrived to ER on 02/11/20 at 1942  Patient coming from: home Lives   With family    Chief Complaint:   Chief Complaint  Patient presents with  . Near Syncope    HPI: Dawn Beasley is a 69 y.o. female with medical history significant of GERD, wt loss,   esophagitis, AAA, HTN HLD  Presented with witnessed syncopal event today witnessed by her husband on the 22nd patient has undergone endoscopy/colonoscopy was found with esophagitis.  Since the procedure though she has not been able to tolerate p.o. well.  She has had hoarse voice persistent nausea and some vomiting blood pressure initially low given 800 mL normal saline and improved to 102/80.  But noted to be satting 78% on room air started on 4 L.  After that satting 100% No chest pain or shortness of breath no cough no fevers no neurological complaints Patient is on chronic benzodiazepines Husband reports coffee ground Infectious risk factors:  Reports  fever, shortness of breath, dry cough,   anosmia/change in taste, N/V/Diarrhea/abdominal pain,       Has  been vaccinated against COVID in February   in house  PCR testing  Pending  No results found for: SARSCOV2NAA   Regarding pertinent Chronic problems:     Hyperlipidemia -  on statins Pravachol   HTN on Norvasc Lopressor   While in ER: Found to have sodium of 127 creatinine 1.69 up from baseline Evidence of dehydration and AKI As well as pancytopenia   ER Provider Called:     Dr.Sherril They Recommend admit to medicine      Hospitalist was called for admission for acute respiratory failure   The following Work up has been ordered so far:  Orders Placed This Encounter  Procedures  . SARS CORONAVIRUS 2 (TAT 6-24 HRS) Nasopharyngeal Nasopharyngeal Swab  . DG Chest Port 1 View  . CT Head Wo Contrast  .  Basic metabolic panel  . CBC  . Urinalysis, Routine w reflex microscopic  . Reticulocytes  . Differential  . Pathologist smear review  . Cardiac monitoring  . Orthostatic vital signs  . Check temperature  . Consult for Unassigned Medical Admission  ALL PATIENTS BEING ADMITTED/HAVING PROCEDURES NEED COVID-19 SCREENING  . Pulse oximetry, continuous  . CBG monitoring, ED  . EKG 12-Lead  . ED EKG  . Type and screen  . ABO/Rh    Following Medications were ordered in ER: Medications  pantoprazole (PROTONIX) 80 mg in sodium chloride 0.9 % 100 mL IVPB (has no administration in time range)  cefTRIAXone (ROCEPHIN) 2 g in sodium chloride 0.9 % 100 mL IVPB (has no administration in time range)  azithromycin (ZITHROMAX) 500 mg in sodium chloride 0.9 % 250 mL IVPB (has no administration in time range)  metroNIDAZOLE (FLAGYL) IVPB 500 mg (has no administration in time range)  lactated ringers bolus 2,000 mL (2,000 mLs Intravenous New Bag/Given 02/11/20 2204)      Significant initial  Findings: Abnormal Labs Reviewed  BASIC METABOLIC PANEL - Abnormal; Notable for the following components:      Result Value   Sodium 127 (*)    Chloride 87 (*)    BUN 50 (*)    Creatinine, Ser 1.69 (*)    Calcium 8.5 (*)  GFR calc non Af Amer 31 (*)    GFR calc Af Amer 36 (*)    All other components within normal limits  CBC - Abnormal; Notable for the following components:   WBC 1.3 (*)    RBC 2.18 (*)    Hemoglobin 8.6 (*)    HCT 24.6 (*)    MCV 112.8 (*)    MCH 39.4 (*)    Platelets 141 (*)    All other components within normal limits  RETICULOCYTES - Abnormal; Notable for the following components:   RBC. 2.31 (*)    Immature Retic Fract 20.0 (*)    All other components within normal limits  CBG MONITORING, ED - Abnormal; Notable for the following components:   Glucose-Capillary 100 (*)    All other components within normal limits     Otherwise labs showing:    Recent Labs  Lab  02/11/20 2026  NA 127*  K 3.9  CO2 26  GLUCOSE 99  BUN 50*  CREATININE 1.69*  CALCIUM 8.5*    Cr     Up from baseline see below Lab Results  Component Value Date   CREATININE 1.69 (H) 02/11/2020   CREATININE 0.63 12/24/2016   CREATININE 0.76 12/23/2016    No results for input(s): AST, ALT, ALKPHOS, BILITOT, PROT, ALBUMIN in the last 168 hours. Lab Results  Component Value Date   CALCIUM 8.5 (L) 02/11/2020     WBC      Component Value Date/Time   WBC 1.3 (LL) 02/11/2020 2026   ANC    Component Value Date/Time   NEUTROABS 1.2 (L) 02/11/2020 2026   ALC No components found for: LYMPHAB    Plt: Lab Results  Component Value Date   PLT 141 (L) 02/11/2020    Lactic Acid, Venous    Component Value Date/Time   LATICACIDVEN 2.3 (HH) 02/12/2020 0008    Procalcitonin 20   COVID-19 Labs  Recent Labs    02/12/20 0008  DDIMER 2.15*  FERRITIN 538*  LDH 152  CRP 50.5*    Lab Results  Component Value Date   SARSCOV2NAA NEGATIVE 02/12/2020   SARSCOV2NAA NEGATIVE 02/11/2020    Venous  Blood Gas result:  PH 7.254  pCO2 63.9  ABG    Component Value Date/Time   HCO3 28.3 (H) 02/12/2020 0036   TCO2 30 02/12/2020 0036   O2SAT 45.0 02/12/2020 0036    HG/HCT  Down      Component Value Date/Time   HGB 8.6 (L) 02/11/2020 2026   HCT 24.6 (L) 02/11/2020 2026    Troponin 500 Cardiac Panel (last 3 results) Recent Labs    02/12/20 0008  CKTOTAL 1,746*      ECG: Ordered Personally reviewed by me showing: HR : 130 Rhythm:  Sinus tachycardia     no evidence of ischemic changes QTC 452   BNP (last 3 results) Recent Labs    02/12/20 0008  BNP 2,120.5*    CBG (last 3)  Recent Labs    02/11/20 2118  GLUCAP 100*      UA  no evidence of UTI      Urine analysis:    Component Value Date/Time   COLORURINE AMBER (A) 02/12/2020 0127   APPEARANCEUR HAZY (A) 02/12/2020 0127   LABSPEC 1.017 02/12/2020 0127   PHURINE 5.0 02/12/2020 0127   GLUCOSEU  NEGATIVE 02/12/2020 0127   HGBUR LARGE (A) 02/12/2020 0127   BILIRUBINUR NEGATIVE 02/12/2020 0127   KETONESUR NEGATIVE 02/12/2020 0127  PROTEINUR 100 (A) 02/12/2020 0127   UROBILINOGEN 0.2 08/02/2007 1052   NITRITE NEGATIVE 02/12/2020 0127   LEUKOCYTESUR NEGATIVE 02/12/2020 0127      Ordered  CT HEAD chronic ischemic changes  CXR -  Diffuse right side opacities    ED Triage Vitals  Enc Vitals Group     BP 02/11/20 1958 (!) 97/57     Pulse Rate 02/11/20 1958 (!) 133     Resp 02/11/20 1958 (!) 27     Temp 02/11/20 2206 98.6 F (37 C)     Temp Source 02/11/20 2206 Oral     SpO2 02/11/20 1950 100 %     Weight 02/11/20 1951 115 lb (52.2 kg)     Height 02/11/20 1951  (1.575 m)     Head Circumference --      Peak Flow --      Pain Score 02/11/20 1951 0     Pain Loc --      Pain Edu? --      Excl. in GC? --   TMAX(24)@       Latest  Blood pressure 114/68, pulse (!) 127, temperature 98.6 F (37 C), temperature source Oral, resp. rate 17, height  (1.575 m), weight 52.2 kg, SpO2 98 %.    Review of Systems:    Pertinent positives include:  fatigue, weight loss   Constitutional:  No weight loss, night sweats, Fevers, chills, HEENT:  No headaches, Difficulty swallowing,Tooth/dental problems,Sore throat,  No sneezing, itching, ear ache, nasal congestion, post nasal drip,  Cardio-vascular:  No chest pain, Orthopnea, PND, anasarca, dizziness, palpitations.no Bilateral lower extremity swelling  GI:  No heartburn, indigestion, abdominal pain, nausea, vomiting, diarrhea, change in bowel habits, loss of appetite, melena, blood in stool, hematemesis Resp:  no shortness of breath at rest. No dyspnea on exertion, No excess mucus, no productive cough, No non-productive cough, No coughing up of blood.No change in color of mucus.No wheezing. Skin:  no rash or lesions. No jaundice GU:  no dysuria, change in color of urine, no urgency or frequency. No straining to urinate.  No  flank pain.  Musculoskeletal:  No joint pain or no joint swelling. No decreased range of motion. No back pain.  Psych:  No change in mood or affect. No depression or anxiety. No memory loss.  Neuro: no localizing neurological complaints, no tingling, no weakness, no double vision, no gait abnormality, no slurred speech, no confusion  All systems reviewed and apart from HOPI all are negative  Past Medical History:   Past Medical History:  Diagnosis Date  . AAA (abdominal aortic aneurysm) (HCC)   . Anxiety   . Arthritis   . Back pain   . Cervical disc herniation   . Depression   . 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 Hypercholesteremia   . Hypertension   . Panic disorder   . Stenosis, spinal, lumbar      Past Surgical History:  Procedure Laterality Date  . APPENDECTOMY    . BACK SURGERY    . CARPAL TUNNEL RELEASE    . LUMBAR LAMINECTOMY/DECOMPRESSION MICRODISCECTOMY N/A 04/12/2014   Procedure: MICRO LUMBAR DECOMPRESSION LUMBAR THREE TO FOUR,LUMBAR  FOUR TO FIVE;  Surgeon: Javier Docker, MD;  Location: [facility];  Service: Orthopedics;  Laterality: N/A;  . MAXOFACIAL SURGERY    . SPINAL CORD STIMULATOR INSERTION N/A 12/06/2015   Procedure: LUMBAR SPINAL CORD STIMULATOR INSERTION;  Surgeon: Venita Lick, MD;  Location: [facility] OR;  Service: Orthopedics;  Laterality: N/A;    Social History:  Ambulatory independently       reports that she has been smoking. She has a 20.00 pack-year smoking history. She does not have any smokeless tobacco history on file. She reports current alcohol use. She reports that she does not use drugs.   Family History:   Family History  Problem Relation Age of Onset  . Cancer Sister     Allergies: Allergies  Allergen Reactions  . Celebrex [Celecoxib] Swelling    Throat swelling  . Sulfa Antibiotics Swelling    Throat swelling     Prior to Admission medications   Medication Sig Start Date End Date Taking? Authorizing Provider  ALPRAZolam  Prudy Feeler) 1 MG tablet Take 0.5 tablets (0.5 mg total) by mouth 2 (two) times daily. 12/25/16   Tillman Sers, DO  amLODipine (NORVASC) 10 MG tablet Take 10 mg by mouth daily. 11/28/16   [provider]  gabapentin (NEURONTIN) 600 MG tablet Take 1 tablet (600 mg total) by mouth 3 (three) times daily. 12/25/16   Tillman Sers, DO  methocarbamol (ROBAXIN) 500 MG tablet Take 1 tablet (500 mg total) by mouth 3 (three) times daily as needed for muscle spasms. 12/06/15   Venita Lick, MD  metoprolol-hydrochlorothiazide (LOPRESSOR HCT) 50-25 MG tablet Take 1 tablet by mouth daily.    [provider]  ondansetron (ZOFRAN) 4 MG tablet Take 1 tablet (4 mg total) by mouth every 8 (eight) hours as needed for nausea or vomiting. 12/06/15   Venita Lick, MD  oxyCODONE-acetaminophen (PERCOCET) 10-325 MG tablet Take 1 tablet by mouth every 4 (four) hours as needed for pain. 12/06/15   Venita Lick, MD  pantoprazole (PROTONIX) 40 MG tablet Take 1 tablet (40 mg total) by mouth 2 (two) times daily. 02/06/20   Meryl Dare, MD  pravastatin (PRAVACHOL) 40 MG tablet Take 40 mg by mouth daily. 12/08/16   [provider]   Physical Exam: Blood pressure 114/68, pulse (!) 127, temperature 98.6 F (37 C), temperature source Oral, resp. rate 17, height  (1.575 m), weight 52.2 kg, SpO2 98 %. 1. General:  in   Acute distress respiratory   acutely ill -appearing 2. Psychological: Alert and  Oriented initially but later on rapidly decompensated And unresponsive 3. Head/ENT:    Dry Mucous Membranes                          Head Non traumatic, neck supple                           Poor Dentition 4. SKIN:  decreased Skin turgor,  Skin clean Dry and intact no rash 5. Heart: Regular rate and rhythm no Murmur, no Rub or gallop 6. Lungs:  no wheezes or crackles   7. Abdomen: Soft, non-tender, Non distended bowel sounds present 8. Lower extremities: no clubbing, cyanosis, no edema 9.  Neurologically Grossly intact, moving all 4 extremities equally   10. MSK: Normal range of motion   All other LABS:     Recent Labs  Lab 02/11/20 2026  WBC 1.3*  HGB 8.6*  HCT 24.6*  MCV 112.8*  PLT 141*     Recent Labs  Lab 02/11/20 2026  NA 127*  K 3.9  CL 87*  CO2 26  GLUCOSE 99  BUN 50*  CREATININE 1.69*  CALCIUM 8.5*     No results for  input(s): AST, ALT, ALKPHOS, BILITOT, PROT, ALBUMIN in the last 168 hours.     Cultures:    Component Value Date/Time   SDES URINE, CATHETERIZED 12/23/2016 1047   SPECREQUEST NONE 12/23/2016 1047   CULT NO GROWTH 12/23/2016 1047   REPTSTATUS 12/24/2016 FINAL 12/23/2016 1047     Radiological Exams on Admission: CT Head Wo Contrast  Result Date: 02/11/2020 CLINICAL DATA:  Ataxia, witnessed syncope EXAM: CT HEAD WITHOUT CONTRAST TECHNIQUE: Contiguous axial images were obtained from the base of the skull through the vertex without intravenous contrast. COMPARISON:  12/23/2016 FINDINGS: Brain: Focal hypodensity within the left basal ganglia consistent with chronic infarct. Confluent hypodensities throughout the periventricular white matter are consistent with age-indeterminate small vessel ischemic changes, favor chronic. No other signs of acute infarct or hemorrhage. Lateral ventricles and midline structures are otherwise unremarkable. No acute extra-axial fluid collections. No mass effect. Vascular: No hyperdense vessel or unexpected calcification. Skull: Normal. Negative for fracture or focal lesion. Sinuses/Orbits: No acute displaced fracture. Other: None IMPRESSION: 1. Small-vessel ischemic changes throughout the periventricular white matter and left basal ganglia, likely chronic. 2. No acute hemorrhage. Electronically Signed   By: Sharlet Salina M.D.   On: 02/11/2020 22:35   DG Chest Port 1 View  Result Date: 02/11/2020 CLINICAL DATA:  Syncope, shortness of breath EXAM: PORTABLE CHEST 1 VIEW COMPARISON:  01/11/2020 FINDINGS:  Single frontal view of the chest demonstrates stable cardiac silhouette. Ectasia of the thoracic aorta unchanged. There is diffuse interstitial and ground-glass airspace disease throughout the right chest, with denser consolidation in the right infrahilar region. Trace right effusion. No pneumothorax. The left chest is clear. Spinal stimulator overlies the midthoracic spine. IMPRESSION: 1. Diffuse right-sided interstitial opacities and ground-glass airspace disease, with dense consolidation of the right lower lobe. Given history of syncope, aspiration could be considered. Asymmetric edema or diffuse right-sided pneumonia could also give this pattern. Electronically Signed   By: Sharlet Salina M.D.   On: 02/11/2020 21:51    Chart has been reviewed   Assessment/Plan  69 y.o. female with medical history significant of GERD, wt loss,   esophagitis, AAA, HTN HLD Admitted for sepsis due to aspiration PNA  Present on Admission: . Aspiration pneumonia (HCC) - continue broad spectrum antibiotics, pt decompesated rapidly required intubation in ER  . Essential hypertension - hold home meds   . Pancytopenia (HCC) - possibly sepsis induced Dr. Truett Perna was made aware ,no indication to transfer to [facility] re consult in AM    . Sepsis [facility]) -  -Patient meets sepsis criteria with  fever  pancytopenia  Tachycardia   Initial lactic acid Lactic Acid, Venous No results found for: LATICACIDVEN Source most likely:  Pneumonia,   -We will rehydrate, treat with IV antibiotics, follow lactic acid - Await results of blood and urine culture and adjust antibiotics as needed - Obtain MRSA serologies  - Obtain respiratory panel    Acute respiratory failure hypoxia most likely secondary to aspiration pneumonia, initially showing evidence of symptomatic hypercarbia, trial of BiPAP initiated but patient has decompensated and required intubation.   Hyponatremia most likely secondary to dehydration will obtain urine  electrolytes and rehydrate Follow sodium  AKI most likely secondary to dehydration obtain urine electrolytes avoid nephrotoxic medications, hold losartan  Elevated troponin -  -no chest pain no EKG changes in the setting of  increased work of breathing and AKI likely due to demand ischemia and poor clearance, monitor on telemetry and cycle cardiac enzymes to trend.  if continues to rise will need further work-up May need echo in AM   Rhabdo - will need to rehydrate     Other plan as per orders.  DVT prophylaxis:  SCD    Code Status:  FULL CODE  as per patient   I had personally discussed CODE STATUS with patient    Family Communication:   Family  at  Bedside  plan of care was discussed  with   Husband,   Disposition Plan:   To home once workup is complete and patient is stable    Following barriers for discharge:                               Afebrile, white count improving able to transition to PO antibiotics                             Will need to be able to tolerate PO                            Will likely need home health, home O2, set up                           Will need consultants to evaluate patient prior to discharge                      Would benefit from PT/OT eval prior to DC  Ordered                   Swallow eval - SLP ordered                                        Consults called:  Dr. Truett PernaSherrill aware of the pt, please re consult in AM   Admission status:  ED Disposition    ED Disposition Condition Comment   Admit  The patient appears reasonably stabilized for admission considering the current resources, flow, and capabilities available in the ED at this time, and I doubt any other [facility] requiring further screening and/or treatment in the ED prior to admission is  present.        inpatient     I Expect 2 midnight stay secondary to severity of patient's current illness need for inpatient interventions justified by the following:  hemodynamic instability  despite optimal treatment (tachycardia  Hypotension  hypoxia)  Severe lab/radiological/exam abnormalities including:     and extensive comorbidities including:     I expect  patient to be hospitalized for 2 midnights requiring inpatient medical care.  Patient is at high risk for adverse outcome (such as loss of life or disability) if not treated.  Indication for inpatient stay as follows:  Severe change from baseline regarding mental status Hemodynamic instability despite maximal medical therapy,    inability to maintain oral hydration    New or worsening hypoxia  Need for IV antibiotics, IV fluids     Level of care      SDU tele indefinitely please discontinue once patient no longer qualifies   Precautions: admitted as  PUI  Airborne and Contact precautions   PPE: Used by the provider:   P100  eye Goggles,  Gloves  gown    Toy Baker 02/12/2020, 3:34 AM    [facility]     after 2 AM please page floor coverage PA If 7AM-7PM, please contact the day team taking care of the patient using [email or web address]   Patient was evaluated in the context of the global COVID-19 pandemic, which necessitated consideration that the patient might be at risk for infection with the SARS-CoV-2 virus that causes COVID-19. Institutional protocols and algorithms that pertain to the evaluation of patients at risk for COVID-19 are in a state of rapid change based on information released by regulatory bodies including the CDC and federal and state organizations. These policies and algorithms were followed during the patient's care.    Total time of critical care provided over 30 min, reassess pt recurrently, spoke to family at bedside. Pt severly obtunded failed BiPAP requiring intubation

## 2020-02-11 NOTE — ED Notes (Signed)
Pt transported to CT ?

## 2020-02-12 ENCOUNTER — Inpatient Hospital Stay (HOSPITAL_COMMUNITY): Payer: PPO

## 2020-02-12 DIAGNOSIS — N179 Acute kidney failure, unspecified: Secondary | ICD-10-CM | POA: Diagnosis not present

## 2020-02-12 DIAGNOSIS — R1110 Vomiting, unspecified: Secondary | ICD-10-CM | POA: Diagnosis not present

## 2020-02-12 DIAGNOSIS — E870 Hyperosmolality and hypernatremia: Secondary | ICD-10-CM | POA: Diagnosis not present

## 2020-02-12 DIAGNOSIS — I5033 Acute on chronic diastolic (congestive) heart failure: Secondary | ICD-10-CM | POA: Diagnosis present

## 2020-02-12 DIAGNOSIS — I248 Other forms of acute ischemic heart disease: Secondary | ICD-10-CM | POA: Diagnosis present

## 2020-02-12 DIAGNOSIS — E43 Unspecified severe protein-calorie malnutrition: Secondary | ICD-10-CM | POA: Diagnosis present

## 2020-02-12 DIAGNOSIS — J690 Pneumonitis due to inhalation of food and vomit: Secondary | ICD-10-CM | POA: Diagnosis present

## 2020-02-12 DIAGNOSIS — M6282 Rhabdomyolysis: Secondary | ICD-10-CM | POA: Diagnosis present

## 2020-02-12 DIAGNOSIS — E860 Dehydration: Secondary | ICD-10-CM | POA: Diagnosis not present

## 2020-02-12 DIAGNOSIS — R778 Other specified abnormalities of plasma proteins: Secondary | ICD-10-CM | POA: Diagnosis not present

## 2020-02-12 DIAGNOSIS — R410 Disorientation, unspecified: Secondary | ICD-10-CM | POA: Diagnosis not present

## 2020-02-12 DIAGNOSIS — N280 Ischemia and infarction of kidney: Secondary | ICD-10-CM | POA: Diagnosis present

## 2020-02-12 DIAGNOSIS — F05 Delirium due to known physiological condition: Secondary | ICD-10-CM | POA: Diagnosis present

## 2020-02-12 DIAGNOSIS — R197 Diarrhea, unspecified: Secondary | ICD-10-CM | POA: Diagnosis not present

## 2020-02-12 DIAGNOSIS — J9620 Acute and chronic respiratory failure, unspecified whether with hypoxia or hypercapnia: Secondary | ICD-10-CM | POA: Diagnosis not present

## 2020-02-12 DIAGNOSIS — R140 Abdominal distension (gaseous): Secondary | ICD-10-CM | POA: Diagnosis not present

## 2020-02-12 DIAGNOSIS — R339 Retention of urine, unspecified: Secondary | ICD-10-CM | POA: Diagnosis not present

## 2020-02-12 DIAGNOSIS — D61818 Other pancytopenia: Secondary | ICD-10-CM | POA: Diagnosis present

## 2020-02-12 DIAGNOSIS — Y95 Nosocomial condition: Secondary | ICD-10-CM | POA: Diagnosis present

## 2020-02-12 DIAGNOSIS — I2694 Multiple subsegmental pulmonary emboli without acute cor pulmonale: Secondary | ICD-10-CM | POA: Diagnosis present

## 2020-02-12 DIAGNOSIS — G894 Chronic pain syndrome: Secondary | ICD-10-CM | POA: Diagnosis not present

## 2020-02-12 DIAGNOSIS — N170 Acute kidney failure with tubular necrosis: Secondary | ICD-10-CM | POA: Diagnosis present

## 2020-02-12 DIAGNOSIS — E871 Hypo-osmolality and hyponatremia: Secondary | ICD-10-CM | POA: Diagnosis present

## 2020-02-12 DIAGNOSIS — L89152 Pressure ulcer of sacral region, stage 2: Secondary | ICD-10-CM | POA: Diagnosis present

## 2020-02-12 DIAGNOSIS — R6521 Severe sepsis with septic shock: Secondary | ICD-10-CM | POA: Diagnosis present

## 2020-02-12 DIAGNOSIS — R0602 Shortness of breath: Secondary | ICD-10-CM | POA: Diagnosis not present

## 2020-02-12 DIAGNOSIS — R55 Syncope and collapse: Secondary | ICD-10-CM | POA: Diagnosis present

## 2020-02-12 DIAGNOSIS — N1330 Unspecified hydronephrosis: Secondary | ICD-10-CM | POA: Diagnosis present

## 2020-02-12 DIAGNOSIS — J9621 Acute and chronic respiratory failure with hypoxia: Secondary | ICD-10-CM | POA: Diagnosis not present

## 2020-02-12 DIAGNOSIS — I110 Hypertensive heart disease with heart failure: Secondary | ICD-10-CM | POA: Diagnosis present

## 2020-02-12 DIAGNOSIS — I10 Essential (primary) hypertension: Secondary | ICD-10-CM | POA: Diagnosis not present

## 2020-02-12 DIAGNOSIS — J9811 Atelectasis: Secondary | ICD-10-CM | POA: Diagnosis present

## 2020-02-12 DIAGNOSIS — R918 Other nonspecific abnormal finding of lung field: Secondary | ICD-10-CM | POA: Diagnosis not present

## 2020-02-12 DIAGNOSIS — R5381 Other malaise: Secondary | ICD-10-CM | POA: Diagnosis not present

## 2020-02-12 DIAGNOSIS — G9341 Metabolic encephalopathy: Secondary | ICD-10-CM | POA: Diagnosis not present

## 2020-02-12 DIAGNOSIS — Z4682 Encounter for fitting and adjustment of non-vascular catheter: Secondary | ICD-10-CM | POA: Diagnosis not present

## 2020-02-12 DIAGNOSIS — J9622 Acute and chronic respiratory failure with hypercapnia: Secondary | ICD-10-CM | POA: Diagnosis present

## 2020-02-12 DIAGNOSIS — A419 Sepsis, unspecified organism: Secondary | ICD-10-CM | POA: Diagnosis present

## 2020-02-12 DIAGNOSIS — G92 Toxic encephalopathy: Secondary | ICD-10-CM | POA: Diagnosis present

## 2020-02-12 DIAGNOSIS — J9601 Acute respiratory failure with hypoxia: Secondary | ICD-10-CM

## 2020-02-12 DIAGNOSIS — Z20822 Contact with and (suspected) exposure to covid-19: Secondary | ICD-10-CM | POA: Diagnosis present

## 2020-02-12 DIAGNOSIS — K920 Hematemesis: Secondary | ICD-10-CM | POA: Diagnosis present

## 2020-02-12 DIAGNOSIS — F13239 Sedative, hypnotic or anxiolytic dependence with withdrawal, unspecified: Secondary | ICD-10-CM | POA: Diagnosis present

## 2020-02-12 DIAGNOSIS — G9340 Encephalopathy, unspecified: Secondary | ICD-10-CM | POA: Diagnosis not present

## 2020-02-12 DIAGNOSIS — J9690 Respiratory failure, unspecified, unspecified whether with hypoxia or hypercapnia: Secondary | ICD-10-CM | POA: Diagnosis present

## 2020-02-12 DIAGNOSIS — I2699 Other pulmonary embolism without acute cor pulmonale: Secondary | ICD-10-CM | POA: Diagnosis not present

## 2020-02-12 LAB — RESPIRATORY PANEL BY PCR

## 2020-02-12 LAB — POCT I-STAT 7, (LYTES, BLD GAS, ICA,H+H)
Acid-base deficit: 4 mmol/L — ABNORMAL HIGH (ref 0.0–2.0)
Bicarbonate: 24.2 mmol/L (ref 20.0–28.0)
Calcium, Ion: 1.21 mmol/L (ref 1.15–1.40)
HCT: 25 % — ABNORMAL LOW (ref 36.0–46.0)
Hemoglobin: 8.5 g/dL — ABNORMAL LOW (ref 12.0–15.0)
O2 Saturation: 90 %
Patient temperature: 99.3
Potassium: 3.6 mmol/L (ref 3.5–5.1)
Sodium: 130 mmol/L — ABNORMAL LOW (ref 135–145)
TCO2: 26 mmol/L (ref 22–32)
pCO2 arterial: 62 mmHg — ABNORMAL HIGH (ref 32.0–48.0)
pH, Arterial: 7.201 — ABNORMAL LOW (ref 7.350–7.450)
pO2, Arterial: 74 mmHg — ABNORMAL LOW (ref 83.0–108.0)

## 2020-02-12 LAB — CBC WITH DIFFERENTIAL/PLATELET
Abs Immature Granulocytes: 0.02 10*3/uL (ref 0.00–0.07)
Basophils Absolute: 0 10*3/uL (ref 0.0–0.1)
Basophils Relative: 1 %
Eosinophils Absolute: 0 10*3/uL (ref 0.0–0.5)
Eosinophils Relative: 0 %
HCT: 24.6 % — ABNORMAL LOW (ref 36.0–46.0)
Hemoglobin: 8.3 g/dL — ABNORMAL LOW (ref 12.0–15.0)
Immature Granulocytes: 1 %
Lymphocytes Relative: 3 %
Lymphs Abs: 0.1 10*3/uL — ABNORMAL LOW (ref 0.7–4.0)
MCH: 39.7 pg — ABNORMAL HIGH (ref 26.0–34.0)
MCHC: 33.7 g/dL (ref 30.0–36.0)
MCV: 117.7 fL — ABNORMAL HIGH (ref 80.0–100.0)
Monocytes Absolute: 0.2 10*3/uL (ref 0.1–1.0)
Monocytes Relative: 5 %
Neutro Abs: 2.5 10*3/uL (ref 1.7–7.7)
Neutrophils Relative %: 90 %
Platelets: 156 10*3/uL (ref 150–400)
RBC: 2.09 MIL/uL — ABNORMAL LOW (ref 3.87–5.11)
RDW: 12.6 % (ref 11.5–15.5)
WBC: 2.8 10*3/uL — ABNORMAL LOW (ref 4.0–10.5)
nRBC: 0.7 % — ABNORMAL HIGH (ref 0.0–0.2)

## 2020-02-12 LAB — URINALYSIS, ROUTINE W REFLEX MICROSCOPIC
Bilirubin Urine: NEGATIVE
Glucose, UA: NEGATIVE mg/dL
Ketones, ur: NEGATIVE mg/dL
Leukocytes,Ua: NEGATIVE
Nitrite: NEGATIVE
Protein, ur: 100 mg/dL — AB
Specific Gravity, Urine: 1.017 (ref 1.005–1.030)
pH: 5 (ref 5.0–8.0)

## 2020-02-12 LAB — RETICULOCYTES
Immature Retic Fract: 21.2 % — ABNORMAL HIGH (ref 2.3–15.9)
RBC.: 2.14 MIL/uL — ABNORMAL LOW (ref 3.87–5.11)
Retic Count, Absolute: 50.7 10*3/uL (ref 19.0–186.0)
Retic Ct Pct: 2.4 % (ref 0.4–3.1)

## 2020-02-12 LAB — CREATININE, URINE, RANDOM: Creatinine, Urine: 114.14 mg/dL

## 2020-02-12 LAB — TROPONIN I (HIGH SENSITIVITY)
Troponin I (High Sensitivity): 523 ng/L (ref <18)
Troponin I (High Sensitivity): 605 ng/L (ref <18)

## 2020-02-12 LAB — RESPIRATORY PANEL BY RT PCR (FLU A&B, COVID)
Influenza A by PCR: NEGATIVE
Influenza B by PCR: NEGATIVE
SARS Coronavirus 2 by RT PCR: NEGATIVE

## 2020-02-12 LAB — GLUCOSE, CAPILLARY
Glucose-Capillary: 73 mg/dL (ref 70–99)
Glucose-Capillary: 77 mg/dL (ref 70–99)
Glucose-Capillary: 71 mg/dL (ref 70–99)
Glucose-Capillary: 65 mg/dL — ABNORMAL LOW (ref 70–99)
Glucose-Capillary: 72 mg/dL (ref 70–99)
Glucose-Capillary: 83 mg/dL (ref 70–99)

## 2020-02-12 LAB — POCT I-STAT EG7
Bicarbonate: 28.3 mmol/L — ABNORMAL HIGH (ref 20.0–28.0)
Calcium, Ion: 1.12 mmol/L — ABNORMAL LOW (ref 1.15–1.40)
HCT: 36 % (ref 36.0–46.0)
Hemoglobin: 12.2 g/dL (ref 12.0–15.0)
O2 Saturation: 45 %
Potassium: 3.6 mmol/L (ref 3.5–5.1)
Sodium: 128 mmol/L — ABNORMAL LOW (ref 135–145)
TCO2: 30 mmol/L (ref 22–32)
pCO2, Ven: 63.9 mmHg — ABNORMAL HIGH (ref 44.0–60.0)
pH, Ven: 7.254 (ref 7.250–7.430)
pO2, Ven: 30 mmHg — CL (ref 32.0–45.0)

## 2020-02-12 LAB — COMPREHENSIVE METABOLIC PANEL
ALT: 22 U/L (ref 0–44)
ALT: 23 U/L (ref 0–44)
AST: 61 U/L — ABNORMAL HIGH (ref 15–41)
AST: 70 U/L — ABNORMAL HIGH (ref 15–41)
Albumin: 2.4 g/dL — ABNORMAL LOW (ref 3.5–5.0)
Albumin: 2.3 g/dL — ABNORMAL LOW (ref 3.5–5.0)
Alkaline Phosphatase: 18 U/L — ABNORMAL LOW (ref 38–126)
Alkaline Phosphatase: 23 U/L — ABNORMAL LOW (ref 38–126)
Anion gap: 13 (ref 5–15)
Anion gap: 12 (ref 5–15)
BUN: 47 mg/dL — ABNORMAL HIGH (ref 8–23)
BUN: 48 mg/dL — ABNORMAL HIGH (ref 8–23)
CO2: 27 mmol/L (ref 22–32)
CO2: 24 mmol/L (ref 22–32)
Calcium: 8.3 mg/dL — ABNORMAL LOW (ref 8.9–10.3)
Calcium: 8.1 mg/dL — ABNORMAL LOW (ref 8.9–10.3)
Chloride: 89 mmol/L — ABNORMAL LOW (ref 98–111)
Chloride: 94 mmol/L — ABNORMAL LOW (ref 98–111)
Creatinine, Ser: 1.39 mg/dL — ABNORMAL HIGH (ref 0.44–1.00)
Creatinine, Ser: 1.32 mg/dL — ABNORMAL HIGH (ref 0.44–1.00)
GFR calc Af Amer: 46 mL/min — ABNORMAL LOW (ref >60)
GFR calc Af Amer: 49 mL/min — ABNORMAL LOW (ref >60)
GFR calc non Af Amer: 39 mL/min — ABNORMAL LOW (ref >60)
GFR calc non Af Amer: 42 mL/min — ABNORMAL LOW (ref >60)
Glucose, Bld: 95 mg/dL (ref 70–99)
Glucose, Bld: 91 mg/dL (ref 70–99)
Potassium: 3.9 mmol/L (ref 3.5–5.1)
Potassium: 3.9 mmol/L (ref 3.5–5.1)
Sodium: 129 mmol/L — ABNORMAL LOW (ref 135–145)
Sodium: 130 mmol/L — ABNORMAL LOW (ref 135–145)
Total Bilirubin: 1.1 mg/dL (ref 0.3–1.2)
Total Bilirubin: 1.2 mg/dL (ref 0.3–1.2)
Total Protein: 5.3 g/dL — ABNORMAL LOW (ref 6.5–8.1)
Total Protein: 5.1 g/dL — ABNORMAL LOW (ref 6.5–8.1)

## 2020-02-12 LAB — APTT: aPTT: 28 seconds (ref 24–36)

## 2020-02-12 LAB — IRON AND TIBC
Iron: 5 ug/dL — ABNORMAL LOW (ref 28–170)
Saturation Ratios: 3 % — ABNORMAL LOW (ref 10.4–31.8)
TIBC: 157 ug/dL — ABNORMAL LOW (ref 250–450)
UIBC: 152 ug/dL

## 2020-02-12 LAB — PROTIME-INR
INR: 1.1 (ref 0.8–1.2)
Prothrombin Time: 13.9 seconds (ref 11.4–15.2)

## 2020-02-12 LAB — MAGNESIUM
Magnesium: 2 mg/dL (ref 1.7–2.4)
Magnesium: 2.1 mg/dL (ref 1.7–2.4)

## 2020-02-12 LAB — FOLATE: Folate: 3 ng/mL — ABNORMAL LOW (ref >5.9)

## 2020-02-12 LAB — PHOSPHORUS
Phosphorus: 4.8 mg/dL — ABNORMAL HIGH (ref 2.5–4.6)
Phosphorus: 5.1 mg/dL — ABNORMAL HIGH (ref 2.5–4.6)

## 2020-02-12 LAB — SARS CORONAVIRUS 2 (TAT 6-24 HRS): SARS Coronavirus 2: NEGATIVE

## 2020-02-12 LAB — SODIUM, URINE, RANDOM: Sodium, Ur: <10 mmol/L

## 2020-02-12 LAB — MRSA PCR SCREENING: MRSA by PCR: NEGATIVE

## 2020-02-12 LAB — VITAMIN B12: Vitamin B-12: 406 pg/mL (ref 180–914)

## 2020-02-12 LAB — TSH: TSH: 0.275 u[IU]/mL — ABNORMAL LOW (ref 0.350–4.500)

## 2020-02-12 LAB — FIBRINOGEN: Fibrinogen: 783 mg/dL — ABNORMAL HIGH (ref 210–475)

## 2020-02-12 LAB — PROCALCITONIN: Procalcitonin: 20.86 ng/mL

## 2020-02-12 LAB — BRAIN NATRIURETIC PEPTIDE: B Natriuretic Peptide: 2120.5 pg/mL — ABNORMAL HIGH (ref 0.0–100.0)

## 2020-02-12 LAB — ABO/RH: ABO/RH(D): O NEG

## 2020-02-12 LAB — CK
Total CK: 1746 U/L — ABNORMAL HIGH (ref 38–234)
Total CK: 1775 U/L — ABNORMAL HIGH (ref 38–234)

## 2020-02-12 LAB — C-REACTIVE PROTEIN: CRP: 50.5 mg/dL — ABNORMAL HIGH (ref <1.0)

## 2020-02-12 LAB — URIC ACID: Uric Acid, Serum: 6.3 mg/dL (ref 2.5–7.1)

## 2020-02-12 LAB — LACTIC ACID, PLASMA
Lactic Acid, Venous: 2.3 mmol/L (ref 0.5–1.9)
Lactic Acid, Venous: 1.7 mmol/L (ref 0.5–1.9)

## 2020-02-12 LAB — FERRITIN: Ferritin: 538 ng/mL — ABNORMAL HIGH (ref 11–307)

## 2020-02-12 LAB — LACTATE DEHYDROGENASE: LDH: 152 U/L (ref 98–192)

## 2020-02-12 LAB — HIV ANTIBODY (ROUTINE TESTING W REFLEX): HIV Screen 4th Generation wRfx: NONREACTIVE

## 2020-02-12 LAB — OSMOLALITY, URINE: Osmolality, Ur: 374 mOsm/kg (ref 300–900)

## 2020-02-12 LAB — STREP PNEUMONIAE URINARY ANTIGEN: Strep Pneumo Urinary Antigen: NEGATIVE

## 2020-02-12 LAB — D-DIMER, QUANTITATIVE: D-Dimer, Quant: 2.15 ug/mL-FEU — ABNORMAL HIGH (ref 0.00–0.50)

## 2020-02-12 MED ORDER — LACTATED RINGERS IV BOLUS
1000.0000 mL | Freq: Once | INTRAVENOUS | Status: AC
  Administered 2020-02-12: 1000 mL via INTRAVENOUS

## 2020-02-12 MED ORDER — FENTANYL CITRATE (PF) 100 MCG/2ML IJ SOLN
50.0000 ug | Freq: Once | INTRAMUSCULAR | Status: AC
  Administered 2020-02-12: 50 ug via INTRAVENOUS
  Filled 2020-02-12: qty 2

## 2020-02-12 MED ORDER — NOREPINEPHRINE 4 MG/250ML-% IV SOLN
INTRAVENOUS | Status: AC
  Filled 2020-02-12: qty 250

## 2020-02-12 MED ORDER — FENTANYL 2500MCG IN NS 250ML (10MCG/ML) PREMIX INFUSION
25.0000 ug/h | INTRAVENOUS | Status: DC
  Administered 2020-02-12: 25 ug/h via INTRAVENOUS
  Filled 2020-02-12: qty 250

## 2020-02-12 MED ORDER — SODIUM CHLORIDE 0.9 % IV SOLN: INTRAVENOUS | Status: DC

## 2020-02-12 MED ORDER — MIDAZOLAM HCL 2 MG/2ML IJ SOLN: 1.0000 mg | INTRAMUSCULAR | Status: DC | PRN

## 2020-02-12 MED ORDER — ORAL CARE MOUTH RINSE
15.0000 mL | OROMUCOSAL | Status: DC
  Administered 2020-02-12 – 2020-02-13 (×12): 15 mL via OROMUCOSAL

## 2020-02-12 MED ORDER — ETOMIDATE 2 MG/ML IV SOLN
10.0000 mg | Freq: Once | INTRAVENOUS | Status: AC
  Administered 2020-02-12: 03:00:00 10 mg via INTRAVENOUS

## 2020-02-12 MED ORDER — VITAL AF 1.2 CAL PO LIQD
1000.0000 mL | ORAL | Status: DC
  Administered 2020-02-12: 17:00:00 1000 mL

## 2020-02-12 MED ORDER — ACETAMINOPHEN 650 MG RE SUPP: 650.0000 mg | Freq: Four times a day (QID) | RECTAL | Status: DC | PRN

## 2020-02-12 MED ORDER — FENTANYL 2500MCG IN NS 250ML (10MCG/ML) PREMIX INFUSION
0.0000 ug/h | INTRAVENOUS | Status: DC
  Administered 2020-02-12: 25 ug/h via INTRAVENOUS
  Administered 2020-02-13: 150 ug/h via INTRAVENOUS
  Filled 2020-02-12 (×2): qty 250

## 2020-02-12 MED ORDER — FENTANYL CITRATE (PF) 100 MCG/2ML IJ SOLN
25.0000 ug | INTRAMUSCULAR | Status: DC | PRN
  Administered 2020-02-12: 50 ug via INTRAVENOUS
  Administered 2020-02-12 (×5): 100 ug via INTRAVENOUS
  Administered 2020-02-13: 02:00:00 50 ug via INTRAVENOUS
  Filled 2020-02-12 (×4): qty 2

## 2020-02-12 MED ORDER — FENTANYL BOLUS VIA INFUSION
25.0000 ug | INTRAVENOUS | Status: DC | PRN
  Filled 2020-02-12: qty 25

## 2020-02-12 MED ORDER — ALPRAZOLAM 0.25 MG PO TABS: 0.5000 mg | ORAL_TABLET | Freq: Two times a day (BID) | ORAL | Status: DC

## 2020-02-12 MED ORDER — HYDROCODONE-ACETAMINOPHEN 5-325 MG PO TABS: 1.0000 | ORAL_TABLET | ORAL | Status: DC | PRN

## 2020-02-12 MED ORDER — CHLORHEXIDINE GLUCONATE CLOTH 2 % EX PADS
6.0000 | MEDICATED_PAD | Freq: Every day | CUTANEOUS | Status: DC
  Administered 2020-02-12 – 2020-03-02 (×19): 6 via TOPICAL

## 2020-02-12 MED ORDER — ONDANSETRON HCL 4 MG/2ML IJ SOLN: 4.0000 mg | Freq: Four times a day (QID) | INTRAMUSCULAR | Status: DC | PRN

## 2020-02-12 MED ORDER — CHLORHEXIDINE GLUCONATE 0.12% ORAL RINSE (MEDLINE KIT)
15.0000 mL | Freq: Two times a day (BID) | OROMUCOSAL | Status: DC
  Administered 2020-02-12 – 2020-02-13 (×3): 15 mL via OROMUCOSAL

## 2020-02-12 MED ORDER — NOREPINEPHRINE 4 MG/250ML-% IV SOLN
0.0000 ug/min | INTRAVENOUS | Status: DC
  Administered 2020-02-12: 03:00:00 2 ug/min via INTRAVENOUS
  Administered 2020-02-12 – 2020-02-13 (×2): 4 ug/min via INTRAVENOUS
  Filled 2020-02-12 (×2): qty 250

## 2020-02-12 MED ORDER — ENOXAPARIN SODIUM 30 MG/0.3ML ~~LOC~~ SOLN
30.0000 mg | SUBCUTANEOUS | Status: DC
  Administered 2020-02-12 – 2020-02-15 (×4): 30 mg via SUBCUTANEOUS
  Filled 2020-02-12 (×4): qty 0.3

## 2020-02-12 MED ORDER — SODIUM CHLORIDE 0.9 % IV SOLN
3.0000 g | Freq: Four times a day (QID) | INTRAVENOUS | Status: DC
  Administered 2020-02-12 – 2020-02-15 (×11): 3 g via INTRAVENOUS
  Filled 2020-02-12 (×3): qty 8
  Filled 2020-02-12: qty 3
  Filled 2020-02-12: qty 8
  Filled 2020-02-12: qty 3
  Filled 2020-02-12: qty 8
  Filled 2020-02-12: qty 3
  Filled 2020-02-12 (×3): qty 8
  Filled 2020-02-12: qty 3
  Filled 2020-02-12 (×2): qty 8

## 2020-02-12 MED ORDER — SODIUM CHLORIDE 0.9 % IV SOLN
2.0000 g | Freq: Two times a day (BID) | INTRAVENOUS | Status: DC
  Administered 2020-02-12: 2 g via INTRAVENOUS
  Filled 2020-02-12: qty 2

## 2020-02-12 MED ORDER — ACETAMINOPHEN 325 MG PO TABS
650.0000 mg | ORAL_TABLET | Freq: Four times a day (QID) | ORAL | Status: DC | PRN
  Administered 2020-02-23 – 2020-02-24 (×2): 650 mg via ORAL
  Filled 2020-02-12 (×2): qty 2

## 2020-02-12 MED ORDER — VANCOMYCIN HCL 750 MG/150ML IV SOLN
750.0000 mg | INTRAVENOUS | Status: DC
  Administered 2020-02-12: 07:00:00 750 mg via INTRAVENOUS
  Filled 2020-02-12 (×2): qty 150

## 2020-02-12 MED ORDER — FENTANYL CITRATE (PF) 100 MCG/2ML IJ SOLN: 25.0000 ug | INTRAMUSCULAR | Status: DC | PRN

## 2020-02-12 MED ORDER — ONDANSETRON HCL 4 MG PO TABS: 4.0000 mg | ORAL_TABLET | Freq: Four times a day (QID) | ORAL | Status: DC | PRN

## 2020-02-12 MED ORDER — ROCURONIUM BROMIDE 50 MG/5ML IV SOLN
75.0000 mg | Freq: Once | INTRAVENOUS | Status: AC
  Administered 2020-02-12: 75 mg via INTRAVENOUS
  Filled 2020-02-12: qty 7.5

## 2020-02-12 MED ORDER — SODIUM CHLORIDE 0.9 % IV SOLN
INTRAVENOUS | Status: DC | PRN
  Administered 2020-02-12: 250 mL via INTRAVENOUS

## 2020-02-12 MED ORDER — PANTOPRAZOLE SODIUM 40 MG IV SOLR
40.0000 mg | Freq: Every day | INTRAVENOUS | Status: DC
  Administered 2020-02-12 – 2020-02-13 (×2): 40 mg via INTRAVENOUS
  Filled 2020-02-12 (×2): qty 40

## 2020-02-12 MED ORDER — METRONIDAZOLE IN NACL 5-0.79 MG/ML-% IV SOLN: 500.0000 mg | Freq: Three times a day (TID) | INTRAVENOUS | Status: DC

## 2020-02-12 MED ORDER — DEXTROSE-NACL 5-0.9 % IV SOLN: INTRAVENOUS | Status: DC

## 2020-02-12 MED ORDER — SODIUM CHLORIDE 0.9 % IV SOLN: 1.0000 g | INTRAVENOUS | Status: DC

## 2020-02-12 NOTE — Progress Notes (Signed)
eLink Physician-Brief Progress Note Patient Name: Dawn Beasley DOB: 1951-05-26 MRN: [id number]   Date of Service  02/12/2020  HPI/Events of Note  Pt admitted over night with septic shock from aspiration pneumonia, aspiration event may have been in the context of recent endoscopy.  eICU Interventions  New Patient Evaluation completed.        Thomasene Lot Michaeljohn Biss 02/12/2020, 6:56 AM

## 2020-02-12 NOTE — Progress Notes (Signed)
Hypoglycemic Event  CBG: 65  Treatment: 4 oz juice/soda  Symptoms: None  Follow-up CBG: Time:2021 CBG Result:72  Possible Reasons for Event: Inadequate meal intake and Unknown  Comments/MD notified:Elink notified.    Theodosia Blender

## 2020-02-12 NOTE — ED Notes (Signed)
MD Doutova notified of pt's worsening condition & increased temp, 99.3 rectal. Respiratory at bedside to initiate Bipap

## 2020-02-12 NOTE — ED Notes (Signed)
Dr Floyd informed of EG7 results 

## 2020-02-12 NOTE — Consult Note (Signed)
Referral MD  Reason for Referral: Leukopenia  Chief Complaint  Patient presents with  . Near Syncope  : Patient is intubated so she cannot give any history.  HPI: Dawn Beasley seems to be a very nice 69 year old white female.  Her husband was with her so I got some history from him.  According to him, she is now been doing well over the past few weeks.  He said that after she had upper and lower endoscopy as she began to have a hard time eating and was not eating all that much.  He says she suffers from chronic lower back pain.  Apparently she came in on 02/11/2020 with syncope.  She had a CBC done in the emergency room.  Her white cell count was 1.3.  Hemoglobin 8.6.  Platelet count 141,000.  Her white cell differential was 82 segs 8 lymphs 9 monos 1 basophil.  Apparently, she has had GI bleeding.  From what I can tell in her NG tube, it does look quite dark.  The last CBC that we have on her in the system was back in February 2018 when her white cell count was 9.6.  Hemoglobin 14.4.  Platelet count 263,000.  Of note, she might have rhabdomyolysis.  Her CK is 1750.  She has a marked troponin and I level of 605.  Her beta natruretic peptide is 2120.  Of note, she does have marked iron deficiency.  Her iron saturation is only 5%.  Today, her white cell count is 2.8.  Hemoglobin 8.3.  Platelet count 156,000.  Her white cell differential is 90 segs 3 lymphs 5 monos.  Her MCV is 118.  I suspect this is probably from her bleeding.  Of note, back on 01/11/2020, she had a CT angiogram of the chest.  This showed no pulmonary embolism.  She had a ascending thoracic aorta that measured 4.1 x 4.1 cm.  She had a 2-3 mm opacity in the right upper lobe felt to be benign.  No adenopathy was noted  Because of the marked leukopenia, we are called to make sure that the patient does not have leukemia.     Past Medical History:  Diagnosis Date  . AAA (abdominal aortic aneurysm) ([place])   . Anxiety   .  Arthritis   . Back pain   . Cervical disc herniation   . Depression   . 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 Hypercholesteremia   . Hypertension   . Panic disorder   . Stenosis, spinal, lumbar   :  Past Surgical History:  Procedure Laterality Date  . APPENDECTOMY    . BACK SURGERY    . CARPAL TUNNEL RELEASE    . LUMBAR LAMINECTOMY/DECOMPRESSION MICRODISCECTOMY N/A 04/12/2014   Procedure: MICRO LUMBAR DECOMPRESSION LUMBAR THREE TO FOUR,LUMBAR  FOUR TO FIVE;  Surgeon: Johnn Hai, MD;  Location: [facility];  Service: Orthopedics;  Laterality: N/A;  . MAXOFACIAL SURGERY    . SPINAL CORD STIMULATOR INSERTION N/A 12/06/2015   Procedure: LUMBAR SPINAL CORD STIMULATOR INSERTION;  Surgeon: Melina Schools, MD;  Location: [place];  Service: Orthopedics;  Laterality: N/A;  :   Current Facility-Administered Medications:  .  0.9 %  sodium chloride infusion, , Intravenous, PRN, Maryjane Hurter, MD, Last Rate: 10 mL/hr at 02/12/20 1200, Rate Verify at 02/12/20 1200 .  acetaminophen (TYLENOL) tablet 650 mg, 650 mg, Oral, Q6H PRN **OR** acetaminophen (TYLENOL) suppository 650 mg, 650 mg, Rectal, Q6H PRN, Gleason, Otilio Carpen, PA-C .  Ampicillin-Sulbactam (UNASYN) 3 g in sodium chloride 0.9 % 100 mL IVPB, 3 g, Intravenous, Q6H, Spero Geralds, MD .  chlorhexidine gluconate (MEDLINE KIT) (PERIDEX) 0.12 % solution 15 mL, 15 mL, Mouth Rinse, BID, Spero Geralds, MD, 15 mL at 02/12/20 1042 .  Chlorhexidine Gluconate Cloth 2 % PADS 6 each, 6 each, Topical, Daily, Maryjane Hurter, MD, 6 each at 02/12/20 0645 .  enoxaparin (LOVENOX) injection 30 mg, 30 mg, Subcutaneous, Q24H, Gleason, Otilio Carpen, PA-C, 30 mg at 02/12/20 1040 .  fentaNYL (SUBLIMAZE) injection 25 mcg, 25 mcg, Intravenous, Q15 min PRN, Gleason, Otilio Carpen, PA-C .  fentaNYL (SUBLIMAZE) injection 25-100 mcg, 25-100 mcg, Intravenous, Q30 min PRN, Gleason, Otilio Carpen, PA-C, 100 mcg at 02/12/20 1358 .  fentaNYL 252mg in NS 2550m(1041mml) infusion-PREMIX,  0-200 mcg/hr, Intravenous, Continuous, GonCollier BullockD .  MEDLINE mouth rinse, 15 mL, Mouth Rinse, 10 times per day, DesSpero GeraldsD, 15 mL at 02/12/20 1348 .  norepinephrine (LEVOPHED) 4-5 MG/250ML-% infusion SOLN, , , ,  .  norepinephrine (LEVOPHED) 4mg52m 250mL26mmix infusion, 0-40 mcg/min, Intravenous, Continuous, Gleason, LauraOtilio CarpenC, Last Rate: 11.25 mL/hr at 02/12/20 1200, 3 mcg/min at 02/12/20 1200 .  ondansetron (ZOFRAN) tablet 4 mg, 4 mg, Oral, Q6H PRN **OR** ondansetron (ZOFRAN) injection 4 mg, 4 mg, Intravenous, Q6H PRN, Gleason, LauraOtilio CarpenC .  pantoprazole (PROTONIX) injection 40 mg, 40 mg, Intravenous, QHS, Gleason, LauraMickel BaasA-C:  . chlorhexidine gluconate (MEDLINE KIT)  15 mL Mouth Rinse BID  . Chlorhexidine Gluconate Cloth  6 each Topical Daily  . enoxaparin (LOVENOX) injection  30 mg Subcutaneous Q24H  . mouth rinse  15 mL Mouth Rinse 10 times per day  . pantoprazole (PROTONIX) IV  40 mg Intravenous QHS  :  Allergies  Allergen Reactions  . Celebrex [Celecoxib] Swelling    Throat swelling  . Sulfa Antibiotics Swelling    Throat swelling  :  Family History  Problem Relation Age of Onset  . Cancer Sister   :  Social History   Socioeconomic History  . Marital status: Married    Spouse name: Not on file  . Number of children: Not on file  . Years of education: Not on file  . Highest education level: Not on file  Occupational History  . Not on file  Tobacco Use  . Smoking status: Current Every Day Smoker    Packs/day: 0.50    Years: 40.00    Pack years: 20.00  Substance and Sexual Activity  . Alcohol use: Yes    Comment: OCCASIONAL  . Drug use: No  . Sexual activity: Not on file  Other Topics Concern  . Not on file  Social History Narrative  . Not on file   Social Determinants of Health   Financial Resource Strain:   . Difficulty of Paying Living Expenses:   Food Insecurity:   . Worried About RunniCharity fundraiserhe Last Year:   .  Ran OArboriculturisthe Last Year:   Transportation Needs:   . Lack Film/video editorical):   . LacMarland Kitchen of Transportation (Non-Medical):   Physical Activity:   . Days of Exercise per Week:   . Minutes of Exercise per Session:   Stress:   . Feeling of Stress :   Social Connections:   . Frequency of Communication with Friends and Family:   . Frequency of Social Gatherings with Friends and Family:   . Attends Religious  Services:   . Active Member of Clubs or Organizations:   . Attends Archivist Meetings:   Marland Kitchen Marital Status:   Intimate Partner Violence:   . Fear of Current or Ex-Partner:   . Emotionally Abused:   Marland Kitchen Physically Abused:   . Sexually Abused:   :   Unobtainable is the review of systems.  Exam: Intubated white female.  She does respond little bit to stimuli.  She is on pressors.  She does have an NG tube in.  Her lungs sound relatively clear on both sides.  She may have some decreased at the bases.  Cardiac exam tachycardic but regular.  Abdomen is soft.  Bowel sounds are decreased.  There is no fluid wave.  There is no splenomegaly.  There is no hepatomegaly.  Extremities show some edema in her legs.  This is mild.  Skin exam shows some small scattered ecchymoses.  Neurological exam shows no focal neurological deficits.  Patient Vitals for the past 24 hrs:  BP Temp Temp src Pulse Resp SpO2 Height Weight  02/12/20 1200 107/67 -- -- (!) 116 (!) 28 96 % -- --  02/12/20 1142 -- 98.7 F (37.1 C) Axillary -- -- -- -- --  02/12/20 1120 -- -- -- (!) 126 (!) 34 96 % -- --  02/12/20 1100 111/71 -- -- (!) 119 (!) 28 96 % -- --  02/12/20 1000 120/67 -- -- (!) 125 (!) 28 95 % -- --  02/12/20 0900 113/71 -- -- (!) 120 (!) 28 98 % -- --  02/12/20 0800 126/81 -- -- (!) 115 (!) 28 96 % -- --  02/12/20 0750 -- 99.1 F (37.3 C) Axillary -- -- -- -- --  02/12/20 0734 -- -- -- (!) 115 (!) 28 94 % -- --  02/12/20 0700 130/77 -- -- (!) 111 (!) 28 93 % -- --  02/12/20 0647 -- --  -- -- -- -- -- 120 lb 2.4 oz (54.5 kg)  02/12/20 0615 129/77 -- -- (!) 108 (!) 28 97 % -- --  02/12/20 0600 121/81 -- -- (!) 109 (!) 28 97 % -- --  02/12/20 0545 126/77 -- -- (!) 109 (!) 28 97 % -- --  02/12/20 0530 123/72 -- -- (!) 112 (!) 28 96 % -- --  02/12/20 0330 116/63 -- -- (!) 117 (!) 22 98 % -- --  02/12/20 0315 116/66 -- -- (!) 120 (!) 22 100 % -- --  02/12/20 0300 124/64 -- -- (!) 133 17 98 % '5\' 2"'  (1.575 m) --  02/12/20 0227 (!) 73/50 -- -- (!) 126 (!) 31 100 % -- --  02/12/20 0200 -- 99.3 F (37.4 C) Rectal -- -- -- -- --  02/12/20 0151 -- 99.1 F (37.3 C) Oral -- -- -- -- --  02/12/20 0045 120/62 -- -- (!) 140 (!) 24 -- -- --  02/12/20 0000 (!) 109/55 -- -- (!) 131 (!) 22 90 % -- --  02/11/20 2300 118/86 -- -- (!) 134 (!) 26 94 % -- --  02/11/20 2206 -- 98.6 F (37 C) Oral -- -- -- -- --  02/11/20 2100 114/68 -- -- (!) 127 17 98 % -- --  02/11/20 2045 115/65 -- -- (!) 132 (!) 29 (!) 84 % -- --  02/11/20 2030 108/66 -- -- (!) 129 (!) 32 100 % -- --  02/11/20 2015 (!) 97/59 -- -- (!) 129 (!) 26 100 % -- --  02/11/20 1958 Marland Kitchen)  97/57 -- -- (!) 133 (!) 27 100 % -- --  02/11/20 1951 -- -- -- -- -- -- '5\' 2"'  (1.575 m) 115 lb (52.2 kg)  02/11/20 1950 -- -- -- -- -- 100 % -- --     Recent Labs    02/11/20 2026 02/12/20 0036 02/12/20 0511 02/12/20 0529  WBC 1.3*  --   --  2.8*  HGB 8.6*   < > 8.5* 8.3*  HCT 24.6*   < > 25.0* 24.6*  PLT 141*  --   --  156   < > = values in this interval not displayed.   Recent Labs    02/12/20 0008 02/12/20 0036 02/12/20 0511 02/12/20 0529  NA 129*   < > 130* 130*  K 3.9   < > 3.6 3.9  CL 89*  --   --  94*  CO2 27  --   --  24  GLUCOSE 95  --   --  91  BUN 47*  --   --  48*  CREATININE 1.39*  --   --  1.32*  CALCIUM 8.3*  --   --  8.1*   < > = values in this interval not displayed.    Blood smear review: Normochromic and normocytic population of red blood cells.  There might be some hypochromic red blood cells.  I see no  nucleated red blood cells.  There is some polychromasia.  I see no teardrop cells.  There is no rouleaux formation.  White cells are decreased in number.  She has good maturity of her white blood cells.  I see no blasts.  There is no hypersegmented polys.  There is no immature myeloid or lymphoid cells.  Platelets are adequate number and size.  Pathology: None    Assessment and Plan: Dawn Beasley is a very nice 69 year old white female.  She might be septic.  She is intubated.  Looks like she is had a GI bleed.  She is markedly iron deficient.  I do not see anything that would suggest a hematologic malignancy or myelodysplasia.  Again I had to believe that the elevated MCV is probably from bleeding.  Her white cell count is coming up.  She has a differential that would suggest sepsis.  She probably will need some IV iron.  Again she is markedly iron deficient which would go along with GI bleeding.  Her husband is very nice.  I was able to talk to him for a while.  He certainly is very supportive of her.  We will follow along and try to help out any way that we can.  I do not see that she needs any invasive procedures (i.e. bone marrow biopsy) at this point.  I know that she will get fantastic care from all the staff in the ICU.  Lattie Haw, MD  Lurena Joiner 19:30

## 2020-02-12 NOTE — ED Notes (Signed)
Admitting MD & PA at bedside

## 2020-02-12 NOTE — ED Provider Notes (Addendum)
Patient was under the care of the hospitalist and in the ED and I was notified that she was concerned about the patient's mental status.  Found to be slightly hypercarbic.  Here with a right-sided pneumonia.  Blood pressures were also slightly trending downward.  After a trial of BiPAP the patient had become unresponsive and I was notified again.  She was not responding to sternal rub.  Appeared to have a failure of BiPAP therapy.  Intubated at bedside.  Her blood pressures also upon my repeat examination were in the 70s systolic.  She started on Levophed.  Will discuss with critical care.  CRITICAL CARE Performed by: Rae Roam   Total critical care time: 35 minutes  Critical care time was exclusive of separately billable procedures and treating other patients.  Critical care was necessary to treat or prevent imminent or life-threatening deterioration.  Critical care was time spent personally by me on the following activities: development of treatment plan with patient and/or surrogate as well as nursing, discussions with consultants, evaluation of patient's response to treatment, examination of patient, obtaining history from patient or surrogate, ordering and performing treatments and interventions, ordering and review of laboratory studies, ordering and review of radiographic studies, pulse oximetry and re-evaluation of patient's condition.   Procedure Name: Intubation Date/Time: 02/12/2020 3:04 AM Performed by: Melene Plan, DO Pre-anesthesia Checklist: Patient identified, Patient being monitored, Emergency Drugs available, Timeout performed and Suction available Oxygen Delivery Method: Non-rebreather mask Preoxygenation: Pre-oxygenation with 100% oxygen Induction Type: Rapid sequence Ventilation: Mask ventilation without difficulty Laryngoscope Size: Glidescope Grade View: Grade I Tube size: 7.5 mm Number of attempts: 2 Airway Equipment and Method:  Video-laryngoscopy Placement Confirmation: ETT inserted through vocal cords under direct vision,  CO2 detector and Breath sounds checked- equal and bilateral Secured at: 22 cm Tube secured with: ETT holder Dental Injury: Bloody posterior oropharynx  Difficulty Due To: Difficulty was anticipated Future Recommendations: Recommend- induction with short-acting agent, and alternative techniques readily available         Melene Plan, DO 02/12/20 0305    Melene Plan, DO 02/12/20 0254

## 2020-02-12 NOTE — Progress Notes (Signed)
Pharmacy Antibiotic Note  LEOTHA WESTERMEYER is a 69 y.o. female admitted on 02/11/2020 with pneumonia.  Pharmacy has been consulted for Vancomycin dosing. WBC 1.3. Noted renal dysfunction.   Plan: Vancomycin 750 mg IV q48h >>Estimated AUC: 540 Cefepime per MD Trend WBC, temp, renal function  F/U infectious work-up Drug levels as indicated   Height: 5\' 2"  (157.5 cm) Weight: 115 lb (52.2 kg) IBW/kg (Calculated) : 50.1  Temp (24hrs), Avg:99 F (37.2 C), Min:98.6 F (37 C), Max:99.3 F (37.4 C)  Recent Labs  Lab 02/11/20 2026 02/12/20 0008  WBC 1.3*  --   CREATININE 1.69* 1.39*  LATICACIDVEN  --  2.3*    Estimated Creatinine Clearance: 31.5 mL/min (A) (by C-G formula based on SCr of 1.39 mg/dL (H)).    Allergies  Allergen Reactions  . Celebrex [Celecoxib] Swelling    Throat swelling  . Sulfa Antibiotics Swelling    Throat swelling   02/14/20, PharmD, BCPS Clinical Pharmacist Phone: [phone]

## 2020-02-12 NOTE — Progress Notes (Signed)
eLink Physician-Brief Progress Note Patient Name: JAKYLA REZA DOB: Dec 13, 1950 MRN: [id number]   Date of Service  02/12/2020  HPI/Events of Note  Mild hypoglycema, tube feeds only started this evening and at 10 cc/hour  eICU Interventions  Will add D5NS to prevent drop in glucose and continue q4h checks      Intervention Category Intermediate Interventions: Other:  Oretha Milch 02/12/2020, 8:36 PM

## 2020-02-12 NOTE — H&P (Signed)
NAME:  POLINA BURMASTER, MRN:  [id number], DOB:  August 06, 1951, LOS: 0 ADMISSION DATE:  02/11/2020, CONSULTATION DATE:  02/12/20 REFERRING MD:  Rhunette Croft, CHIEF COMPLAINT:   Near syncope Brief History   Is a 69 year old female who had an EGD done on 3/22 and since then has had nausea and an episode of hematemesis.  Yesterday History of present illness   69 year old female with past medical history of aortic aneurysm, anxiety, arthritis, hypertension and spinal stenosis who had an EGD done on 3/22 and since then has had nausea and one reported episode of hematemesis with poor p.o. intake.  Today she had a witnessed near syncopal event was brought in by her husband.   In the ED, patient was awake and alert with mild confusion.  She was initially hypoxic and was placed on 4 L nasal cannula.   Chest x-ray showed diffuse right-sided interstitial opacities and dense consolidation in the right lower lobe, concern for aspiration and no acute findings on CT head.  Throughout her ED course she became more altered and hypoxic eventually requiring intubation.  She also became hypotensive and required peripheral Levophed.  Her significant for leukopenia with a white blood cell count of 1.3, elevated BNP of 2000 and troponin of 500.  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   Significant Hospital Events   3/28, admit to PCCM  Consults:    Procedures:    Significant Diagnostic Tests:  3/27 CT head>> no acute findings, chronic small vessel ischemic changes 28 CXR>>Interval progression of extensive parenchymal and interstitial opacities throughout the right lung, with more dense  consolidative opacity within the right perihilar region and right lung base.  Micro Data:  3/28 respiratory viral panel>> negative Antimicrobials:   vancomycin- 3/27 Azithromycin 3/27  Flagyl 3/27-  Interim history/subjective:  Patient remains sedated and intubated in the emergency department, and to PCCM  Objective   Blood pressure 116/63, pulse (!) 117, temperature 99.3 F (37.4 C), temperature source Rectal, resp. rate (!) 22, height 5\' 2"  (1.575 m), weight 52.2 kg, SpO2 98 %.    Vent Mode: PRVC FiO2 (%):  [100 %] 100 % Set Rate:  [22 bmp] 22 bmp Vt Set:  [400 mL] 400 mL PEEP:  [5 cmH20] 5 cmH20 Plateau Pressure:  [19 cmH20] 19 cmH20   Intake/Output Summary (Last 24 hours) at 02/12/2020 0500 Last data filed at 02/12/2020 0022 Gross per 24 hour  Intake 1800 ml  Output --  Net 1800 ml   Filed Weights   02/11/20 1951  Weight: 52.2 kg   General: Elderly female, well-nourished and sedated intubated, HEENT: MM pink/moist Neuro: Pupils equal bilaterally and responsive to light, patient is triggering breaths on the vent otherwise responsive  CV: s1s2 decreased air movement right lower lobe, no rhonchi or wheezing, no m/r/g PULM: Decreased air movement on the right, no wheezing or rhonchi GI: soft, bsx4 active  Extremities: warm/dry, no edema  Skin: no rashes or lesions   Resolved Hospital Problem list     Assessment & Plan:  Acute hypoxic respiratory failure likely secondary to aspiration pneumonia -Patient is tolerating minimal vent settings, repeat ABG pending P: -Cover broadly broadly initially with Vanco and cefepime -Follow blood cultures, urine Legionella --Maintain  full vent support with SAT/SBT as tolerated -titrate Vent setting to maintain SpO2 greater than or equal to 90%. -HOB elevated 30 degrees. -Plateau pressures less than 30 cm H20.  -Follow chest x-ray, ABG prn.   -Bronchial hygiene and RT/bronchodilator protocol.    Septic shock, possibly some  element of cardiogenic shock -Patient is hypotensive requiring peripheral vasopressors  -no lactic acidosis, however is severely leukopenic -Received 2 L IV fluids in the ED P: -Bedside ultrasound showed no pericardial effusion or respiratory variation in the  IVC, hold further fluids -Cover with vancomycin and cefepime, blood cultures are pending -Trend lactic acid, currently requiring peripheral dose Levophed, titrate to maintain MAP >60, may need central line if requiring higher doses  Leukopenia -Peripheral smear is pending, likely secondary to gram-negative sepsis P: -Continue to follow morning CBC   Nonoliguric acute kidney injury -Suspect prerenal volume depletion -Creatinine improved from 1.63 after IV fluids, baseline appears to be <1.0 P: -Continue to follow BMP post IV fluids, and avoid nephrotoxins, monitor urine output   Esophagitis -EGD performed on Monday, continue Zofran and Protonix until symptoms improve  Near syncopal event -Suspect this was related to orthostasis and volume depletion, head CT negative    Best practice:  Diet: N.p.o.  pain/Anxiety/Delirium protocol (if indicated): As needed fentanyl VAP protocol (if indicated): HOB 30 degrees, daily SBT DVT prophylaxis: Heparin GI prophylaxis: Protonix Glucose control: Sliding scale insulin Mobility: Bedrest Code Status: Full code Family Communication:  Disposition: ICU  Labs   CBC: Recent Labs  Lab 02/11/20 2026 02/12/20 0036  WBC 1.3*  --   NEUTROABS 1.2*  --   HGB 8.6* 12.2  HCT 24.6* 36.0  MCV 112.8*  --   PLT 141*  --     Basic Metabolic Panel: Recent Labs  Lab 02/11/20 2026 02/12/20 0008 02/12/20 0036  NA 127* 129* 128*  K 3.9 3.9 3.6  CL 87* 89*  --   CO2 26 27  --   GLUCOSE 99 95  --   BUN 50* 47*  --   CREATININE 1.69* 1.39*  --   CALCIUM 8.5* 8.3*  --   MG  --  2.0  --   PHOS  --  4.8*  --    GFR: Estimated Creatinine Clearance: 31.5 mL/min (A) (by C-G formula  based on SCr of 1.39 mg/dL (H)). Recent Labs  Lab 02/11/20 2026 02/12/20 0008  PROCALCITON  --  20.86  WBC 1.3*  --   LATICACIDVEN  --  2.3*    Liver Function Tests: Recent Labs  Lab 02/12/20 0008  AST 61*  ALT 22  ALKPHOS 18*  BILITOT 1.1  PROT 5.3*  ALBUMIN 2.4*   No results for input(s): LIPASE, AMYLASE in the last 168 hours. No results for input(s): AMMONIA in the last 168 hours.  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   Surgical History    Past Surgical History:  Procedure Laterality Date  . APPENDECTOMY    . BACK SURGERY    . CARPAL TUNNEL RELEASE    . LUMBAR LAMINECTOMY/DECOMPRESSION MICRODISCECTOMY N/A 04/12/2014   Procedure: MICRO LUMBAR DECOMPRESSION LUMBAR THREE TO FOUR,LUMBAR  FOUR TO FIVE;  Surgeon: Johnn Hai, MD;  Location: [facility];  Service: Orthopedics;  Laterality: N/A;  . MAXOFACIAL SURGERY    . SPINAL CORD STIMULATOR INSERTION N/A 12/06/2015   Procedure: LUMBAR SPINAL CORD STIMULATOR INSERTION;  Surgeon: Melina Schools, MD;  Location: [facility];  Service: Orthopedics;  Laterality: N/A;     Social History   reports that she has been smoking. She has a 20.00 pack-year smoking history. She does not have any smokeless tobacco history on file. She reports current alcohol use. She  reports that she does not use drugs.   Family History   Her family history includes Cancer in her sister.   Allergies Allergies  Allergen Reactions  . Celebrex [Celecoxib] Swelling    Throat swelling  . Sulfa Antibiotics Swelling    Throat swelling     Home Medications  Prior to Admission medications   Medication Sig Start Date End Date Taking? Authorizing Provider  ALPRAZolam Duanne Moron) 1 MG tablet Take 0.5 tablets (0.5 mg total) by mouth 2 (two) times daily. 12/25/16  Yes Riccio, Angela C, DO  finasteride (PROSCAR) 5 MG tablet Take 5 mg by mouth daily.   Yes [provider]  losartan (COZAAR) 100 MG tablet Take 100 mg by mouth daily.   Yes [provider]  ondansetron (ZOFRAN) 4 MG tablet Take 1 tablet (4 mg total) by mouth every 8 (eight) hours as needed for nausea or vomiting. 12/06/15  Yes Melina Schools, MD  oxyCODONE-acetaminophen (PERCOCET) 10-325 MG tablet Take 1 tablet by mouth every 4 (four) hours as needed for pain. 12/06/15  Yes Melina Schools, MD  pantoprazole (PROTONIX) 40 MG tablet Take 1 tablet (40 mg total) by mouth 2 (two) times daily. 02/06/20  Yes Ladene Artist, MD  pravastatin (PRAVACHOL) 40 MG tablet Take 40 mg by mouth daily. 12/08/16  Yes [provider]  traZODone (DESYREL) 100 MG tablet Take 100 mg by mouth at bedtime as needed for sleep.   Yes [provider]  gabapentin (NEURONTIN) 600 MG tablet Take 1 tablet (600 mg total) by mouth 3 (three) times daily. Patient not taking: Reported on 02/12/2020 12/25/16   Steve Rattler, DO     Critical care time: 52 minutes     CRITICAL CARE Performed by: Otilio Carpen Kimia Finan   Total critical care time: 52 minutes  Critical care time was exclusive of separately billable procedures and treating other patients.  Critical care was necessary to treat or prevent imminent or life-threatening deterioration.  Critical care was time spent personally by me on the following activities:  development of treatment plan with patient and/or surrogate as well as nursing, discussions with consultants, evaluation of patient's response to treatment, examination of patient, obtaining history from patient or surrogate, ordering and performing treatments and interventions, ordering and review of laboratory studies, ordering and review of radiographic studies, pulse oximetry and re-evaluation of patient's condition.  Otilio Carpen Eloise Picone, PA-C [facility]

## 2020-02-12 NOTE — Progress Notes (Signed)
Brief Critical Care Update:  Dawn Beasley Is a 69 y.o. woman with recent EGD for nausea, vomiting, weight loss over the last 2 years. Persistent hoarse voice after procedure. Came in from home with altered mental status and emesis. Intubated in the ED for clinical coma.    Aspiration pneumonia Severe sepsis/septic shock Acute hypoxemic respiratory failure Elevated troponin - suspect demand ischemia Rhabdomyolysis AKI Hyperphosphatemia Hyponatremia Tobacco use disorder   On and off norepinephrine. Suspect volume depletion so will bolus IVF and start maintenance for rhabdomyolysis. Leukopenia is from severe sepsis.  Transitioned to Unasyn for aspiration pneumonia. Have weaned down ventilator settings over the course of the day.   The patient is critically ill with multiple organ systems failure and requires high complexity decision making for assessment and support, frequent evaluation and titration of therapies, application of advanced monitoring technologies and extensive interpretation of multiple databases.   Critical Care Time devoted to patient care services described in this note is an additional 37 minutes from what was billed earlier this date. This time reflects time of care of this signee [name] . This critical care time does not reflect separately billable procedures or procedure time, teaching time or supervisory time of PA/NP/Med student/Med Resident etc but could involve care discussion time.  [name] Pulmonary and Critical Care Medicine 02/12/2020 5:17 PM  Pager: [phone] After hours pager: [phone]

## 2020-02-12 NOTE — ED Notes (Signed)
This RN went in pt's room to straighten arm out to correct IV pump beeping. Pt found to be slightly diaphoretic, oral temp 99.1, pt disoriented & less able to follow instruction. Will attempt rectal temp & report to MD

## 2020-02-13 ENCOUNTER — Inpatient Hospital Stay (HOSPITAL_COMMUNITY): Payer: PPO

## 2020-02-13 DIAGNOSIS — N179 Acute kidney failure, unspecified: Secondary | ICD-10-CM

## 2020-02-13 DIAGNOSIS — J9621 Acute and chronic respiratory failure with hypoxia: Secondary | ICD-10-CM

## 2020-02-13 DIAGNOSIS — J690 Pneumonitis due to inhalation of food and vomit: Secondary | ICD-10-CM

## 2020-02-13 DIAGNOSIS — R55 Syncope and collapse: Secondary | ICD-10-CM

## 2020-02-13 DIAGNOSIS — E860 Dehydration: Secondary | ICD-10-CM

## 2020-02-13 DIAGNOSIS — M6282 Rhabdomyolysis: Secondary | ICD-10-CM

## 2020-02-13 DIAGNOSIS — R6521 Severe sepsis with septic shock: Secondary | ICD-10-CM

## 2020-02-13 DIAGNOSIS — R778 Other specified abnormalities of plasma proteins: Secondary | ICD-10-CM

## 2020-02-13 LAB — PATHOLOGIST SMEAR REVIEW

## 2020-02-13 LAB — COMPREHENSIVE METABOLIC PANEL
ALT: 27 U/L (ref 0–44)
AST: 68 U/L — ABNORMAL HIGH (ref 15–41)
Albumin: 2 g/dL — ABNORMAL LOW (ref 3.5–5.0)
Alkaline Phosphatase: 45 U/L (ref 38–126)
Anion gap: 14 (ref 5–15)
BUN: 38 mg/dL — ABNORMAL HIGH (ref 8–23)
CO2: 22 mmol/L (ref 22–32)
Calcium: 8.3 mg/dL — ABNORMAL LOW (ref 8.9–10.3)
Chloride: 100 mmol/L (ref 98–111)
Creatinine, Ser: 0.91 mg/dL (ref 0.44–1.00)
GFR calc Af Amer: >60 mL/min (ref >60)
GFR calc non Af Amer: >60 mL/min (ref >60)
Glucose, Bld: 105 mg/dL — ABNORMAL HIGH (ref 70–99)
Potassium: 3.5 mmol/L (ref 3.5–5.1)
Sodium: 136 mmol/L (ref 135–145)
Total Bilirubin: 1.6 mg/dL — ABNORMAL HIGH (ref 0.3–1.2)
Total Protein: 4.9 g/dL — ABNORMAL LOW (ref 6.5–8.1)

## 2020-02-13 LAB — CBC WITH DIFFERENTIAL/PLATELET
Abs Immature Granulocytes: 0.26 10*3/uL — ABNORMAL HIGH (ref 0.00–0.07)
Basophils Absolute: 0 10*3/uL (ref 0.0–0.1)
Basophils Relative: 0 %
Eosinophils Absolute: 0 10*3/uL (ref 0.0–0.5)
Eosinophils Relative: 0 %
HCT: 22.6 % — ABNORMAL LOW (ref 36.0–46.0)
Hemoglobin: 8 g/dL — ABNORMAL LOW (ref 12.0–15.0)
Immature Granulocytes: 3 %
Lymphocytes Relative: 3 %
Lymphs Abs: 0.3 10*3/uL — ABNORMAL LOW (ref 0.7–4.0)
MCH: 40.4 pg — ABNORMAL HIGH (ref 26.0–34.0)
MCHC: 35.4 g/dL (ref 30.0–36.0)
MCV: 114.1 fL — ABNORMAL HIGH (ref 80.0–100.0)
Monocytes Absolute: 0.5 10*3/uL (ref 0.1–1.0)
Monocytes Relative: 5 %
Neutro Abs: 8.9 10*3/uL — ABNORMAL HIGH (ref 1.7–7.7)
Neutrophils Relative %: 89 %
Platelets: 148 10*3/uL — ABNORMAL LOW (ref 150–400)
RBC: 1.98 MIL/uL — ABNORMAL LOW (ref 3.87–5.11)
RDW: 12.8 % (ref 11.5–15.5)
WBC: 10 10*3/uL (ref 4.0–10.5)
nRBC: 0.2 % (ref 0.0–0.2)

## 2020-02-13 LAB — GLUCOSE, CAPILLARY
Glucose-Capillary: 112 mg/dL — ABNORMAL HIGH (ref 70–99)
Glucose-Capillary: 107 mg/dL — ABNORMAL HIGH (ref 70–99)
Glucose-Capillary: 127 mg/dL — ABNORMAL HIGH (ref 70–99)
Glucose-Capillary: 124 mg/dL — ABNORMAL HIGH (ref 70–99)
Glucose-Capillary: 120 mg/dL — ABNORMAL HIGH (ref 70–99)
Glucose-Capillary: 126 mg/dL — ABNORMAL HIGH (ref 70–99)

## 2020-02-13 LAB — ECHOCARDIOGRAM COMPLETE
Height: 62 in
Weight: 1947.1 oz

## 2020-02-13 MED ORDER — DEXMEDETOMIDINE HCL IN NACL 400 MCG/100ML IV SOLN
0.4000 ug/kg/h | INTRAVENOUS | Status: DC
  Administered 2020-02-13 (×2): 0.4 ug/kg/h via INTRAVENOUS
  Filled 2020-02-13 (×2): qty 100

## 2020-02-13 MED ORDER — WHITE PETROLATUM EX OINT
TOPICAL_OINTMENT | CUTANEOUS | Status: AC
  Filled 2020-02-13: qty 28.35

## 2020-02-13 MED ORDER — ORAL CARE MOUTH RINSE
15.0000 mL | Freq: Two times a day (BID) | OROMUCOSAL | Status: DC
  Administered 2020-02-13 – 2020-03-02 (×32): 15 mL via OROMUCOSAL

## 2020-02-13 NOTE — Progress Notes (Signed)
NAME:  Dawn Beasley, MRN:  [id number], DOB:  1951-03-27, LOS: 1 ADMISSION DATE:  02/11/2020, CONSULTATION DATE:  02/11/2020 REFERRING MD:  TRH, CHIEF COMPLAINT:  Respiratory failure   Brief History   Dawn Beasley Is a 69 y.o. woman with recent EGD for nausea, vomiting, weight loss over the last 2 years. Persistent hoarse voice after procedure. Came in from home with altered mental status and emesis. Intubated in the ED for clinical coma.   Consults:    Procedures:  Intubation 3/27  Significant Diagnostic Tests:  3/27 chest xray with right middle and lower lobe airspace opacities concerning for aspiration  Micro Data:  MRSA PCR negative  Antimicrobials:  Vancomycin 3/27>>3/28 Cefepime 3/27 >>3/28 Unasyn 3/28>>  Interim history/subjective:  No overnight issues. Awake and alert this morning. Passing SBT. Appears a little anxious.  Objective   Blood pressure 102/65, pulse 94, temperature 99.6 F (37.6 C), temperature source Oral, resp. rate (!) 28, height 5\' 2"  (1.575 m), weight 55.2 kg, SpO2 93 %.    Vent Mode: PSV;CPAP FiO2 (%):  [40 %-50 %] 40 % Set Rate:  [28 bmp] 28 bmp Vt Set:  [400 mL] 400 mL PEEP:  [5 cmH20] 5 cmH20 Pressure Support:  [5 cmH20] 5 cmH20 Plateau Pressure:  [16 cmH20-19 cmH20] 16 cmH20   Intake/Output Summary (Last 24 hours) at 02/13/2020 0836 Last data filed at 02/13/2020 0600 Gross per 24 hour  Intake 2654.41 ml  Output 1580 ml  Net 1074.41 ml   Filed Weights   02/11/20 1951 02/12/20 0647 02/13/20 0500  Weight: 52.2 kg 54.5 kg 55.2 kg    Examination: General: awake, breathing spontaneously,  HENT: ETT to vent Lungs: coarse, symmetric, no wheezes, sounds of mechanical ventilation auscultated Cardiovascular: tachycardic, regular Abdomen: soft, nontender Extremities: no edema Neuro: awake, alert, following commands on 150 mcg fentanyl   Resolved Hospital Problem list     Assessment & Plan:  Dawn Beasley is a 69 y.o. woman with chronic  nausea, vomiting who presents with aspiration pneumonia after a recent EGD.  Acute hypoxemic respiratory failure Aspiration pneumonia Tobacco use disorder - 1ppd smoker - PS trial this morning. Consider extubation.  - unasyn x 5 days, will time out abx today. No wheezing, does not appear to be copd exacerbation.   Septic vs circulatory shock - on low dose norepinephrine - suspect component of hypovolemia with sedation mediated hypotension  Elevated troponin - suspect demand ischemia - echo ordered  Rhabdomyolysis  AKI Hyperphosphatemia Hyponatremia - suspect pre-renal etiology, getting MIVF  Best practice:  Diet: NPO, tube feeds Pain/Anxiety/Delirium protocol (if indicated): will add precedex if not extubated VAP protocol (if indicated): ordered DVT prophylaxis: Lovenox GI prophylaxis: Pepcid Glucose control: hypoglycemic on Dextrose with tube feeds Foley  Mobility: bed rest Code Status: Full Family Communication: updated husband at bedside 3/28, will update again today Disposition: needs ICU   Labs   CBC: Recent Labs  Lab 02/11/20 2026 02/12/20 0036 02/12/20 0511 02/12/20 0529 02/13/20 0225  WBC 1.3*  --   --  2.8* 10.0  NEUTROABS 1.2*  --   --  2.5 8.9*  HGB 8.6* 12.2 8.5* 8.3* 8.0*  HCT 24.6* 36.0 25.0* 24.6* 22.6*  MCV 112.8*  --   --  117.7* 114.1*  PLT 141*  --   --  156 148*    Basic Metabolic Panel: Recent Labs  Lab 02/11/20 2026 02/11/20 2026 02/12/20 0008 02/12/20 0036 02/12/20 0511 02/12/20 0529 02/13/20 0225  NA 127*   < >  129* 128* 130* 130* 136  K 3.9   < > 3.9 3.6 3.6 3.9 3.5  CL 87*  --  89*  --   --  94* 100  CO2 26  --  27  --   --  24 22  GLUCOSE 99  --  95  --   --  91 105*  BUN 50*  --  47*  --   --  48* 38*  CREATININE 1.69*  --  1.39*  --   --  1.32* 0.91  CALCIUM 8.5*  --  8.3*  --   --  8.1* 8.3*  MG  --   --  2.0  --   --  2.1  --   PHOS  --   --  4.8*  --   --  5.1*  --    < > = values in this interval not displayed.    GFR: Estimated Creatinine Clearance: 48.1 mL/min (by C-G formula based on SCr of 0.91 mg/dL). Recent Labs  Lab 02/11/20 2026 02/12/20 0008 02/12/20 0529 02/12/20 0715 02/13/20 0225  PROCALCITON  --  20.86  --   --   --   WBC 1.3*  --  2.8*  --  10.0  LATICACIDVEN  --  2.3*  --  1.7  --     Liver Function Tests: Recent Labs  Lab 02/12/20 0008 02/12/20 0529 02/13/20 0225  AST 61* 70* 68*  ALT 22 23 27   ALKPHOS 18* 23* 45  BILITOT 1.1 1.2 1.6*  PROT 5.3* 5.1* 4.9*  ALBUMIN 2.4* 2.3* 2.0*   No results for input(s): LIPASE, AMYLASE in the last 168 hours. No results for input(s): AMMONIA in the last 168 hours.  ABG    Component Value Date/Time   PHART 7.201 (L) 02/12/2020 0511   PCO2ART 62.0 (H) 02/12/2020 0511   PO2ART 74.0 (L) 02/12/2020 0511   HCO3 24.2 02/12/2020 0511   TCO2 26 02/12/2020 0511   ACIDBASEDEF 4.0 (H) 02/12/2020 0511   O2SAT 90.0 02/12/2020 0511     Coagulation Profile: Recent Labs  Lab 02/12/20 0008  INR 1.1    Cardiac Enzymes: Recent Labs  Lab 02/12/20 0008 02/12/20 0529  CKTOTAL 1,746* 1,775*    HbA1C: No results found for: HGBA1C  CBG: Recent Labs  Lab 02/12/20 1948 02/12/20 2021 02/12/20 2327 02/13/20 0353 02/13/20 0742  GLUCAP 65* 72 83 112* 107*    Critical care time:   The patient is critically ill with multiple organ systems failure and requires high complexity decision making for assessment and support, frequent evaluation and titration of therapies, application of advanced monitoring technologies and extensive interpretation of multiple databases.   Critical Care Time devoted to patient care services described in this note is 36 minutes. This time reflects the time of my personal involvement. This critical care time does not reflect separately billable procedures or procedure time, teaching time or supervisory time of PA/NP/Med student/Med Resident etc but could involve care discussion time.  02/15/20  Pulmonary and Critical Care Medicine 02/13/2020 8:36 AM  Pager: [phone] After hours pager: [phone]

## 2020-02-13 NOTE — Procedures (Signed)
Extubation Procedure Note  Patient Details:   Name: Dawn Beasley DOB: 06/01/1951 MRN: [id number]   Airway Documentation:  Airway 7.5 mm (Active)  Secured at (cm) 23 cm 02/13/20 1148  Measured From Lips 02/13/20 1148  Secured Location Right 02/13/20 1148  Secured By [employer] 02/13/20 1148  Tube Holder Repositioned Yes 02/13/20 1148  Cuff Pressure (cm H2O) 28 cm H2O 02/13/20 0830  Site Condition Dry 02/13/20 1148   Vent end date: (not recorded) Vent end time: (not recorded)   Evaluation  O2 sats: stable throughout Complications: No apparent complications Patient did tolerate procedure well. Bilateral Breath Sounds: Diminished   Yes, pt able to speak softly  [name] 02/13/2020, 12:06 PM

## 2020-02-13 NOTE — Progress Notes (Signed)
  Echocardiogram 2D Echocardiogram has been performed.  Dawn Beasley 02/13/2020, 9:32 AM

## 2020-02-13 NOTE — Progress Notes (Signed)
Initial Nutrition Assessment  DOCUMENTATION CODES:   Not applicable  INTERVENTION:   Recommend Ensure Enlive po BID, each supplement provides 350 kcal and 20 grams of protein  NUTRITION DIAGNOSIS:   Inadequate oral intake related to poor appetite, nausea, acute illness as evidenced by NPO status.  GOAL:   Patient will meet greater than or equal to 90% of their needs  MONITOR:   Diet advancement, PO intake, Labs, Weight trends  REASON FOR ASSESSMENT:   Ventilator, Consult Enteral/tube feeding initiation and management  ASSESSMENT:   69 yo female with AMS and emesis with hypoxic respiratory failure with aspiration pneumonia, AKI, in shock . Pt with recent EGD for nausea, vomiting, weight loss over the last 2 years. PMH includes HTN, depression, anxiety   3/27 Intubated 3/29 Extubated    TF Vital AF 1.2 at 10 ml/hr; TF stopped with extubation NPO, SLP eval pending  Unable to obtain diet and weight history at time of assessment; however noted N/V with wt loss PTA.   Labs: reviewed Meds: D5-NS at 50 ml/hr  Diet Order:   Diet Order            Diet NPO time specified  Diet effective now              EDUCATION NEEDS:   Not appropriate for education at this time  Skin:  Skin Assessment: Reviewed RN Assessment  Last BM:  no documented BM  Height:   Ht Readings from Last 1 Encounters:  02/12/20 5\' 2"  (1.575 m)    Weight:   Wt Readings from Last 1 Encounters:  02/13/20 55.2 kg    BMI:  Body mass index is 22.26 kg/m.  Estimated Nutritional Needs:   Kcal:  1700-1900 kcal  Protein:  85-95 g  Fluid:  >/= 1.7 L    02/15/20 MS, RDN, LDN, CNSC RD Pager Number and Weekend/On-Call After Hours Pager Located in [place]

## 2020-02-14 ENCOUNTER — Telehealth: Payer: Self-pay | Admitting: Gastroenterology

## 2020-02-14 LAB — COMPREHENSIVE METABOLIC PANEL
ALT: 46 U/L — ABNORMAL HIGH (ref 0–44)
AST: 99 U/L — ABNORMAL HIGH (ref 15–41)
Albumin: 2.1 g/dL — ABNORMAL LOW (ref 3.5–5.0)
Alkaline Phosphatase: 92 U/L (ref 38–126)
Anion gap: 7 (ref 5–15)
BUN: 28 mg/dL — ABNORMAL HIGH (ref 8–23)
CO2: 29 mmol/L (ref 22–32)
Calcium: 8.6 mg/dL — ABNORMAL LOW (ref 8.9–10.3)
Chloride: 107 mmol/L (ref 98–111)
Creatinine, Ser: 0.57 mg/dL (ref 0.44–1.00)
GFR calc Af Amer: >60 mL/min (ref >60)
GFR calc non Af Amer: >60 mL/min (ref >60)
Glucose, Bld: 133 mg/dL — ABNORMAL HIGH (ref 70–99)
Potassium: 3.1 mmol/L — ABNORMAL LOW (ref 3.5–5.1)
Sodium: 143 mmol/L (ref 135–145)
Total Bilirubin: 0.9 mg/dL (ref 0.3–1.2)
Total Protein: 5.1 g/dL — ABNORMAL LOW (ref 6.5–8.1)

## 2020-02-14 LAB — CBC WITH DIFFERENTIAL/PLATELET
Abs Immature Granulocytes: 0.19 10*3/uL — ABNORMAL HIGH (ref 0.00–0.07)
Basophils Absolute: 0.1 10*3/uL (ref 0.0–0.1)
Basophils Relative: 1 %
Eosinophils Absolute: 0 10*3/uL (ref 0.0–0.5)
Eosinophils Relative: 0 %
HCT: 22.4 % — ABNORMAL LOW (ref 36.0–46.0)
Hemoglobin: 7.6 g/dL — ABNORMAL LOW (ref 12.0–15.0)
Immature Granulocytes: 2 %
Lymphocytes Relative: 3 %
Lymphs Abs: 0.4 10*3/uL — ABNORMAL LOW (ref 0.7–4.0)
MCH: 39.6 pg — ABNORMAL HIGH (ref 26.0–34.0)
MCHC: 33.9 g/dL (ref 30.0–36.0)
MCV: 116.7 fL — ABNORMAL HIGH (ref 80.0–100.0)
Monocytes Absolute: 0.7 10*3/uL (ref 0.1–1.0)
Monocytes Relative: 6 %
Neutro Abs: 10.7 10*3/uL — ABNORMAL HIGH (ref 1.7–7.7)
Neutrophils Relative %: 88 %
Platelets: 158 10*3/uL (ref 150–400)
RBC: 1.92 MIL/uL — ABNORMAL LOW (ref 3.87–5.11)
RDW: 13.2 % (ref 11.5–15.5)
WBC: 12 10*3/uL — ABNORMAL HIGH (ref 4.0–10.5)
nRBC: 0 % (ref 0.0–0.2)

## 2020-02-14 LAB — GLUCOSE, CAPILLARY
Glucose-Capillary: 114 mg/dL — ABNORMAL HIGH (ref 70–99)
Glucose-Capillary: 95 mg/dL (ref 70–99)
Glucose-Capillary: 96 mg/dL (ref 70–99)
Glucose-Capillary: 150 mg/dL — ABNORMAL HIGH (ref 70–99)
Glucose-Capillary: 139 mg/dL — ABNORMAL HIGH (ref 70–99)

## 2020-02-14 MED ORDER — PANTOPRAZOLE SODIUM 40 MG PO TBEC
40.0000 mg | DELAYED_RELEASE_TABLET | Freq: Two times a day (BID) | ORAL | Status: DC
  Administered 2020-02-14 – 2020-02-16 (×4): 40 mg via ORAL
  Filled 2020-02-14 (×4): qty 1

## 2020-02-14 MED ORDER — POTASSIUM CHLORIDE 10 MEQ/100ML IV SOLN
10.0000 meq | INTRAVENOUS | Status: AC
  Administered 2020-02-14 (×4): 10 meq via INTRAVENOUS
  Filled 2020-02-14 (×4): qty 100

## 2020-02-14 MED ORDER — RESOURCE THICKENUP CLEAR PO POWD
ORAL | Status: DC | PRN
  Filled 2020-02-14 (×2): qty 125

## 2020-02-14 NOTE — Progress Notes (Signed)
NAME:  Dawn Beasley, MRN:  [id number], DOB:  1951-09-15, LOS: 2 ADMISSION DATE:  02/11/2020, CONSULTATION DATE:  02/11/2020 REFERRING MD:  TRH, CHIEF COMPLAINT:  Respiratory failure   Brief History   Dawn Beasley Is a 69 y.o. woman with recent EGD for nausea, vomiting, weight loss over the last 2 years. Persistent hoarse voice after procedure. Came in from home with altered mental status and emesis. Intubated in the ED for clinical coma.   Consults:    Procedures:  Intubation 3/27 >> 3/29  Significant Diagnostic Tests:  3/27 chest xray with right middle and lower lobe airspace opacities concerning for aspiration  Micro Data:  MRSA PCR negative  Antimicrobials:  Vancomycin 3/27>>3/28 Cefepime 3/27 >>3/28 Unasyn 3/28>>  Interim history/subjective:  Successfully extubated 3/29 Pressors off, Precedex off WBC has rebounded   Objective   Blood pressure (!) 122/97, pulse 88, temperature 98.2 F (36.8 C), temperature source Oral, resp. rate (!) 26, height 5\' 2"  (1.575 m), weight 56.6 kg, SpO2 100 %.    Vent Mode: PSV;CPAP FiO2 (%):  [40 %] 40 % PEEP:  [5 cmH20] 5 cmH20 Pressure Support:  [5 cmH20] 5 cmH20   Intake/Output Summary (Last 24 hours) at 02/14/2020 0951 Last data filed at 02/14/2020 0800 Gross per 24 hour  Intake 1966.12 ml  Output 1155 ml  Net 811.12 ml   Filed Weights   02/12/20 0647 02/13/20 0500 02/14/20 0500  Weight: 54.5 kg 55.2 kg 56.6 kg    Examination: General: Elderly woman, no distress, laying in bed HENT: Oropharynx clear, no upper airway noise Lungs: Distant but clear, some bronchial breath sounds on the right, no wheezing Cardiovascular: Regular, distant, no murmur Abdomen: Nondistended, positive bowel sounds Extremities: No significant edema Neuro: Awake, alert, interacting appropriately, follows commands, answers questions  Resolved Hospital Problem list     Assessment & Plan:  Dawn Beasley is a 69 y.o. woman with chronic nausea,  vomiting who presents with aspiration pneumonia after a recent EGD.  Acute hypoxemic respiratory failure, improved Right aspiration pneumonia Tobacco use disorder - 1ppd smoker Successfully extubated 3/29 Push pulmonary hygiene Complete full course Unasyn, 5 to 7 days.  Transition to p.o. when deemed safe to do so based on swallowing evaluation Swallowing precautions, SLP following Not currently on home bronchodilators Follow chest x-ray She will need smoking cessation counseling  Septic shock Norepinephrine weaned to off Follow hemodynamics, telemetry Keep I=O, note poor p.o. intake  Elevated troponin - suspect demand ischemia Echocardiogram overall reassuring, mildly reduced RV free wall, question McConnell sign.  PA pressures could not be determined, RA size normal  History hypertension Restart home losartan when stable to do so  History hyperlipidemia Restart pravastatin  AKI, resolved Hyperphosphatemia Hyponatremia, resolved Follow BMP, phosphorus  Toxic metabolic encephalopathy, improved Precedex to off, supportive care  Chronic emesis, gastropathy, abdominal discomfort GERD On chronic pantoprazole 40 mg twice daily, plan to restart    Best practice:  Diet: Clear liquids with thickener, advance as per SLP evaluation Pain/Anxiety/Delirium protocol (if indicated): N/A VAP protocol (if indicated): Completed DVT prophylaxis: Lovenox GI prophylaxis: Pantoprazole p.o. Glucose control: N/A Foley removed 3/30 Mobility: bed rest Code Status: Full Family Communication: Updated patient and husband at bedside 3/30 Disposition: Transfer to MedSurg on 3/30   Labs   CBC: Recent Labs  Lab 02/11/20 2026 02/11/20 2026 02/12/20 0036 02/12/20 0511 02/12/20 0529 02/13/20 0225 02/14/20 0303  WBC 1.3*  --   --   --  2.8* 10.0 12.0*  NEUTROABS 1.2*  --   --   --  2.5 8.9* 10.7*  HGB 8.6*   < > 12.2 8.5* 8.3* 8.0* 7.6*  HCT 24.6*   < > 36.0 25.0* 24.6* 22.6* 22.4*    MCV 112.8*  --   --   --  117.7* 114.1* 116.7*  PLT 141*  --   --   --  156 148* 158   < > = values in this interval not displayed.    Basic Metabolic Panel: Recent Labs  Lab 02/11/20 2026 02/11/20 2026 02/12/20 0008 02/12/20 0008 02/12/20 0036 02/12/20 0017 02/12/20 0529 02/13/20 0225 02/14/20 0303  NA 127*   < > 129*   < > 128* 130* 130* 136 143  K 3.9   < > 3.9   < > 3.6 3.6 3.9 3.5 3.1*  CL 87*  --  89*  --   --   --  94* 100 107  CO2 26  --  27  --   --   --  24 22 29   GLUCOSE 99  --  95  --   --   --  91 105* 133*  BUN 50*  --  47*  --   --   --  48* 38* 28*  CREATININE 1.69*  --  1.39*  --   --   --  1.32* 0.91 0.57  CALCIUM 8.5*  --  8.3*  --   --   --  8.1* 8.3* 8.6*  MG  --   --  2.0  --   --   --  2.1  --   --   PHOS  --   --  4.8*  --   --   --  5.1*  --   --    < > = values in this interval not displayed.   GFR: Estimated Creatinine Clearance: 54.7 mL/min (by C-G formula based on SCr of 0.57 mg/dL). Recent Labs  Lab 02/11/20 2026 02/12/20 0008 02/12/20 0529 02/12/20 0715 02/13/20 0225 02/14/20 0303  PROCALCITON  --  20.86  --   --   --   --   WBC 1.3*  --  2.8*  --  10.0 12.0*  LATICACIDVEN  --  2.3*  --  1.7  --   --     Liver Function Tests: Recent Labs  Lab 02/12/20 0008 02/12/20 0529 02/13/20 0225 02/14/20 0303  AST 61* 70* 68* 99*  ALT 22 23 27  46*  ALKPHOS 18* 23* 45 92  BILITOT 1.1 1.2 1.6* 0.9  PROT 5.3* 5.1* 4.9* 5.1*  ALBUMIN 2.4* 2.3* 2.0* 2.1*   No results for input(s): LIPASE, AMYLASE in the last 168 hours. No results for input(s): AMMONIA in the last 168 hours.  ABG    Component Value Date/Time   PHART 7.201 (L) 02/12/2020 0511   PCO2ART 62.0 (H) 02/12/2020 0511   PO2ART 74.0 (L) 02/12/2020 0511   HCO3 24.2 02/12/2020 0511   TCO2 26 02/12/2020 0511   ACIDBASEDEF 4.0 (H) 02/12/2020 0511   O2SAT 90.0 02/12/2020 0511     Coagulation Profile: Recent Labs  Lab 02/12/20 0008  INR 1.1    Cardiac Enzymes: Recent  Labs  Lab 02/12/20 0008 02/12/20 0529  CKTOTAL 1,746* 1,775*    HbA1C: No results found for: HGBA1C  CBG: Recent Labs  Lab 02/13/20 1523 02/13/20 1926 02/13/20 2322 02/14/20 0335 02/14/20 0728  GLUCAP 124* 120* 126* 114* 95    Critical care time: N/a  Dawn Pupa, MD, PhD 02/14/2020, 10:14 AM [facility] [phone] or if no answer [phone]

## 2020-02-14 NOTE — Telephone Encounter (Signed)
Dawn Beasley,  Please thank him for the update on her health status and we wish her a speedy recovery. Please contact him to update him on this message and on the pathology reports.   The colon biopsies showed colitis. Only focal small areas of erosions on colonoscopy in cecum and sigmoid so this finding is likely not clinically significant. If she develops diarrhea or rectal bleeding we need to re-evaluate.   The biopsies from her upper endoscopy showed a reactive gastropathy which is a benign irritation of the stomach lining. No H pylori.  She should continue on pantoprazole 40 mg po bid long term for mgmt of GERD and gastropathy.   Office follow up with [place] in about 6 weeks.   No path letter needed

## 2020-02-14 NOTE — Evaluation (Signed)
Clinical/Bedside Swallow Evaluation Patient Details  Name: Dawn Beasley MRN: [id number] Date of Birth: 06/04/1951  Today's Date: 02/14/2020 Time: SLP Start Time (ACUTE ONLY): 0741 SLP Stop Time (ACUTE ONLY): 0804 SLP Time Calculation (min) (ACUTE ONLY): 23 min  Past Medical History:  Past Medical History:  Diagnosis Date  . AAA (abdominal aortic aneurysm) (HCC)   . Anxiety   . Arthritis   . Back pain   . Cervical disc herniation   . Depression   . 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 Hypercholesteremia   . Hypertension   . Panic disorder   . Stenosis, spinal, lumbar    Past Surgical History:  Past Surgical History:  Procedure Laterality Date  . APPENDECTOMY    . BACK SURGERY    . CARPAL TUNNEL RELEASE    . LUMBAR LAMINECTOMY/DECOMPRESSION MICRODISCECTOMY N/A 04/12/2014   Procedure: MICRO LUMBAR DECOMPRESSION LUMBAR THREE TO FOUR,LUMBAR  FOUR TO FIVE;  Surgeon: Javier Docker, MD;  Location: [facility];  Service: Orthopedics;  Laterality: N/A;  . MAXOFACIAL SURGERY    . SPINAL CORD STIMULATOR INSERTION N/A 12/06/2015   Procedure: LUMBAR SPINAL CORD STIMULATOR INSERTION;  Surgeon: Venita Lick, MD;  Location: [facility] OR;  Service: Orthopedics;  Laterality: N/A;   HPI:  69 yo female adm to [facility] with respiratory issues requiring intubation - Recent endoscopy 02/06/2020 showing esophagitis.  She reports post-endoscopy severe vomiting = dark colored emesis.  [name] due to frequent issues with vomiting and weight per her report.  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  Imaging studies show severe right multifocal infiltrates c/w pna.   Pt denies issues with swallowing but admits to vomiting and weight loss.   Assessment / Plan / Recommendation Clinical Impression  Patient's voice subjectively is not hoarse per this SLP perception.  No focal CN deficits and pt's cough is strong.  However she does become mildly dyspneic with effort.  Pt provided with ice  chips, nectar thick liquids, thin water and applesauce.  Perceptively appears with delay - either oral transiting and/or pharyngeal with indications of possible airway infiltration of thin via cup c/b cough with 4/6 boluses.  No indication of airway compromise with nectar, applesauce, ice chips nor tsps water.  Recommend modified diet currently of clears - nectar thick with strict precautions.  Pt denies dysphagia prior to admit, states issue is vomiting.  Will follow up for po tolerance, indication for instrumental evaluation.  Educated pt to precautions using teach back and written instructions. SLP Visit Diagnosis: Dysphagia, unspecified (R13.10)    Aspiration Risk  Mild aspiration risk    Diet Recommendation Nectar-thick liquid(clears nectar, tsps water and ice chips)   Liquid Administration via: Cup;Straw Medication Administration: Whole meds with puree Supervision: Full supervision/cueing for compensatory strategies(initially full supervision) Compensations: Slow rate;Small sips/bites Postural Changes: Seated upright at 90 degrees;Remain upright for at least 30 minutes after po intake    Other  Recommendations Oral Care Recommendations: Oral care BID Other Recommendations: Order thickener from pharmacy   Follow up Recommendations (tbd)      Frequency and Duration min 2x/week  1 week       Prognosis Prognosis for Safe Diet Advancement: Good      Swallow Study   General Date of Onset: 02/14/20 HPI: 69 yo female adm to [facility] with respiratory issues requiring intubation - Recent endoscopy 02/06/2020 showing esophagitis.  She reports post-endoscopy severe vomiting = dark colored emesis.  [name] due to frequent issues with  vomiting and weight per her report.  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  Imaging studies show severe right multifocal infiltrates c/w pna.   Pt denies issues with swallowing but admits to vomiting and weight loss. Diet  Prior to this Study: NPO Temperature Spikes Noted: No Respiratory Status: Nasal cannula History of Recent Intubation: Yes Length of Intubations (days): 1 days Date extubated: 02/13/20 Behavior/Cognition: Alert;Cooperative Oral Cavity Assessment: Within Functional Limits Oral Care Completed by SLP: No Oral Cavity - Dentition: Adequate natural dentition Vision: Functional for self-feeding Self-Feeding Abilities: Able to feed self Patient Positioning: Upright in bed Baseline Vocal Quality: Low vocal intensity;Other (comment)(mildly hoarseness) Volitional Swallow: Able to elicit    Oral/Motor/Sensory Function Overall Oral Motor/Sensory Function: Within functional limits   Ice Chips Ice chips: Within functional limits Presentation: Spoon   Thin Liquid Thin Liquid: Impaired Presentation: Self Fed;Cup;Spoon Oral Phase Impairments: (symptoms of oral delay vs pharyngeal delay) Pharyngeal  Phase Impairments: Cough - Immediate;Cough - Delayed;Suspected delayed Swallow Other Comments: cough with 4/6 boluses    Nectar Thick Nectar Thick Liquid: Impaired Oral Phase Impairments: (symptoms of oral transit delay vs pharyngeal delay) Pharyngeal Phase Impairments: Suspected delayed Swallow   Honey Thick Honey Thick Liquid: Not tested   Puree Puree: Within functional limits Presentation: Self Fed;Spoon   Solid     Solid: Not tested Other Comments: no solids on floor to test and pt had severe n/v prior to admis      Macario Golds 02/14/2020,8:25 AM   Kathleen Lime, MS [place] Office [phone]

## 2020-02-14 NOTE — Telephone Encounter (Signed)
Patients husband calling- wanted to make Dr. Russella Dar aware that patient is in hospital. She had colonoscopy/endoscopy 3/22- states she was vomiting and was having dark stools. Patients husband stated that she now has pneumonia. They are not sure what the cause of everything is yet but they just wanted to let Dr. Russella Dar know what was going on.

## 2020-02-14 NOTE — Progress Notes (Signed)
Pt was desating in 70s when she tried to get up. I bumped patient to 6L, pt slowly came up to 90s. Pt O2 up and down and HR remains elevated. Tried to call her critical care providers about five times, but it just kept ringing and giving a busy signal. Will try again later. Will continue to monitor patient.

## 2020-02-14 NOTE — Progress Notes (Signed)
   Vital Signs MEWS/VS Documentation      02/14/2020 1200 02/14/2020 1430 02/14/2020 1500 02/14/2020 1600   MEWS Score:  3  4  2  3    MEWS Score Color:  Yellow  Red  Yellow  Yellow   Resp:  --  (!) 31  20  --   Pulse:  --  (!) 122  (!) 125  --   BP:  --  136/89  (!) 139/94  --   Temp:  --  --  97.8 F (36.6 C)  --   O2 Device:  --  --  HFNC  --   O2 Flow Rate (L/min):  --  --  4 L/min  --   Level of Consciousness:  Alert  --  Alert  Alert      Patient transferred from ICU today to 2W with tachycardia, this is not a new finding. Assessed patient, she is resting comfortably communicating with her family in no distress. Will continue to monitor patient.      Kier Smead 02/14/2020,5:51 PM

## 2020-02-14 NOTE — Progress Notes (Signed)
eLink Physician-Brief Progress Note Patient Name: Dawn Beasley DOB: 03-17-1951 MRN: [id number]   Date of Service  02/14/2020  HPI/Events of Note  Episode of hypoxia - Nursing request for Cardiac and oximetry monitoring.   eICU Interventions  Will order: 1. Continuous cardiac monitoring. 2. Continuous oximetry.      Intervention Category Major Interventions: Hypoxemia - evaluation and management  Jennefer Kopp [place] 02/14/2020, 11:12 PM

## 2020-02-14 NOTE — Telephone Encounter (Signed)
Patient's husband notified of the results and recommendations She is getting out of the ICU today and he states when she is discharged from the hospital he will call and set up the post 6 week appt with Dr. Russella Dar

## 2020-02-14 NOTE — Progress Notes (Signed)
[facility] ADULT ICU REPLACEMENT PROTOCOL FOR AM LAB REPLACEMENT ONLY  The patient does apply for the [facility] Adult ICU Electrolyte Replacment Protocol based on the criteria listed below:   1. Is GFR >/= 40 ml/min? Yes.    Patient's GFR today is >60 2. Is urine output >/= 0.5 ml/kg/hr for the last 6 hours? Yes.   Patient's UOP is 0.6 ml/kg/hr 3. Is BUN < 60 mg/dL? Yes.    Patient's BUN today is 28 4. Abnormal electrolyte(s): k 3.1 5. Ordered repletion with: protocol per PIV 6. If a panic level lab has been reported, has the CCM MD in charge been notified? No..   Physician:    Markus Daft A 02/14/2020 5:27 AM

## 2020-02-15 ENCOUNTER — Inpatient Hospital Stay (HOSPITAL_COMMUNITY): Payer: PPO

## 2020-02-15 DIAGNOSIS — I2699 Other pulmonary embolism without acute cor pulmonale: Secondary | ICD-10-CM

## 2020-02-15 DIAGNOSIS — I10 Essential (primary) hypertension: Secondary | ICD-10-CM | POA: Diagnosis not present

## 2020-02-15 DIAGNOSIS — G9341 Metabolic encephalopathy: Secondary | ICD-10-CM

## 2020-02-15 DIAGNOSIS — D61818 Other pancytopenia: Secondary | ICD-10-CM | POA: Diagnosis not present

## 2020-02-15 DIAGNOSIS — F1010 Alcohol abuse, uncomplicated: Secondary | ICD-10-CM

## 2020-02-15 DIAGNOSIS — Z720 Tobacco use: Secondary | ICD-10-CM

## 2020-02-15 DIAGNOSIS — J690 Pneumonitis due to inhalation of food and vomit: Secondary | ICD-10-CM | POA: Diagnosis not present

## 2020-02-15 DIAGNOSIS — R778 Other specified abnormalities of plasma proteins: Secondary | ICD-10-CM | POA: Diagnosis not present

## 2020-02-15 LAB — CBC WITH DIFFERENTIAL/PLATELET
Abs Immature Granulocytes: 0.44 10*3/uL — ABNORMAL HIGH (ref 0.00–0.07)
Basophils Absolute: 0.1 10*3/uL (ref 0.0–0.1)
Basophils Relative: 0 %
Eosinophils Absolute: 0 10*3/uL (ref 0.0–0.5)
Eosinophils Relative: 0 %
HCT: 25.3 % — ABNORMAL LOW (ref 36.0–46.0)
Hemoglobin: 8.5 g/dL — ABNORMAL LOW (ref 12.0–15.0)
Immature Granulocytes: 3 %
Lymphocytes Relative: 3 %
Lymphs Abs: 0.6 10*3/uL — ABNORMAL LOW (ref 0.7–4.0)
MCH: 39.7 pg — ABNORMAL HIGH (ref 26.0–34.0)
MCHC: 33.6 g/dL (ref 30.0–36.0)
MCV: 118.2 fL — ABNORMAL HIGH (ref 80.0–100.0)
Monocytes Absolute: 1.1 10*3/uL — ABNORMAL HIGH (ref 0.1–1.0)
Monocytes Relative: 7 %
Neutro Abs: 15.2 10*3/uL — ABNORMAL HIGH (ref 1.7–7.7)
Neutrophils Relative %: 87 %
Platelets: 189 10*3/uL (ref 150–400)
RBC: 2.14 MIL/uL — ABNORMAL LOW (ref 3.87–5.11)
RDW: 13.8 % (ref 11.5–15.5)
WBC: 17.4 10*3/uL — ABNORMAL HIGH (ref 4.0–10.5)
nRBC: 0.2 % (ref 0.0–0.2)

## 2020-02-15 LAB — BLOOD GAS, ARTERIAL
Acid-Base Excess: 2.1 mmol/L — ABNORMAL HIGH (ref 0.0–2.0)
Bicarbonate: 27.7 mmol/L (ref 20.0–28.0)
Drawn by: 280991
FIO2: 48
O2 Saturation: 96.9 %
Patient temperature: 37
pCO2 arterial: 56.2 mmHg — ABNORMAL HIGH (ref 32.0–48.0)
pH, Arterial: 7.314 — ABNORMAL LOW (ref 7.350–7.450)
pO2, Arterial: 99.2 mmHg (ref 83.0–108.0)

## 2020-02-15 LAB — BASIC METABOLIC PANEL
Anion gap: 10 (ref 5–15)
BUN: 18 mg/dL (ref 8–23)
CO2: 26 mmol/L (ref 22–32)
Calcium: 8.8 mg/dL — ABNORMAL LOW (ref 8.9–10.3)
Chloride: 108 mmol/L (ref 98–111)
Creatinine, Ser: 0.52 mg/dL (ref 0.44–1.00)
GFR calc Af Amer: >60 mL/min (ref >60)
GFR calc non Af Amer: >60 mL/min (ref >60)
Glucose, Bld: 137 mg/dL — ABNORMAL HIGH (ref 70–99)
Potassium: 3 mmol/L — ABNORMAL LOW (ref 3.5–5.1)
Sodium: 144 mmol/L (ref 135–145)

## 2020-02-15 LAB — GLUCOSE, CAPILLARY
Glucose-Capillary: 138 mg/dL — ABNORMAL HIGH (ref 70–99)
Glucose-Capillary: 120 mg/dL — ABNORMAL HIGH (ref 70–99)
Glucose-Capillary: 111 mg/dL — ABNORMAL HIGH (ref 70–99)
Glucose-Capillary: 100 mg/dL — ABNORMAL HIGH (ref 70–99)
Glucose-Capillary: 110 mg/dL — ABNORMAL HIGH (ref 70–99)
Glucose-Capillary: 109 mg/dL — ABNORMAL HIGH (ref 70–99)

## 2020-02-15 LAB — HEPARIN LEVEL (UNFRACTIONATED): Heparin Unfractionated: 0.39 IU/mL (ref 0.30–0.70)

## 2020-02-15 LAB — CBC
HCT: 27.4 % — ABNORMAL LOW (ref 36.0–46.0)
Hemoglobin: 9 g/dL — ABNORMAL LOW (ref 12.0–15.0)
MCH: 40.5 pg — ABNORMAL HIGH (ref 26.0–34.0)
MCHC: 32.8 g/dL (ref 30.0–36.0)
MCV: 123.4 fL — ABNORMAL HIGH (ref 80.0–100.0)
Platelets: 208 10*3/uL (ref 150–400)
RBC: 2.22 MIL/uL — ABNORMAL LOW (ref 3.87–5.11)
RDW: 14.5 % (ref 11.5–15.5)
WBC: 20.7 10*3/uL — ABNORMAL HIGH (ref 4.0–10.5)
nRBC: 0.3 % — ABNORMAL HIGH (ref 0.0–0.2)

## 2020-02-15 LAB — MAGNESIUM: Magnesium: 1.9 mg/dL (ref 1.7–2.4)

## 2020-02-15 LAB — TROPONIN I (HIGH SENSITIVITY)
Troponin I (High Sensitivity): 1369 ng/L (ref <18)
Troponin I (High Sensitivity): 1211 ng/L (ref <18)

## 2020-02-15 LAB — PHOSPHORUS: Phosphorus: 2.2 mg/dL — ABNORMAL LOW (ref 2.5–4.6)

## 2020-02-15 MED ORDER — SODIUM CHLORIDE 0.9 % IV SOLN
2.0000 g | Freq: Two times a day (BID) | INTRAVENOUS | Status: DC
  Administered 2020-02-15 – 2020-02-18 (×7): 2 g via INTRAVENOUS
  Filled 2020-02-15 (×9): qty 2

## 2020-02-15 MED ORDER — ADULT MULTIVITAMIN W/MINERALS CH
1.0000 | ORAL_TABLET | Freq: Every day | ORAL | Status: DC
  Administered 2020-02-15 – 2020-02-16 (×2): 1 via ORAL
  Filled 2020-02-15 (×2): qty 1

## 2020-02-15 MED ORDER — HEPARIN (PORCINE) 25000 UT/250ML-% IV SOLN
950.0000 [IU]/h | INTRAVENOUS | Status: DC
  Administered 2020-02-15: 950 [IU]/h via INTRAVENOUS
  Administered 2020-02-16: 1050 [IU]/h via INTRAVENOUS
  Administered 2020-02-17: 16:00:00 1200 [IU]/h via INTRAVENOUS
  Administered 2020-02-19 – 2020-02-22 (×3): 950 [IU]/h via INTRAVENOUS
  Filled 2020-02-15 (×8): qty 250

## 2020-02-15 MED ORDER — LORAZEPAM 2 MG/ML IJ SOLN
1.0000 mg | INTRAMUSCULAR | Status: DC | PRN
  Administered 2020-02-15: 1 mg via INTRAVENOUS

## 2020-02-15 MED ORDER — LORAZEPAM 2 MG/ML IJ SOLN: 0.0000 mg | Freq: Two times a day (BID) | INTRAMUSCULAR | Status: DC

## 2020-02-15 MED ORDER — POTASSIUM CHLORIDE 10 MEQ/100ML IV SOLN
10.0000 meq | INTRAVENOUS | Status: AC
  Administered 2020-02-15 (×3): 10 meq via INTRAVENOUS
  Filled 2020-02-15 (×3): qty 100

## 2020-02-15 MED ORDER — DEXMEDETOMIDINE HCL IN NACL 400 MCG/100ML IV SOLN
0.4000 ug/kg/h | INTRAVENOUS | Status: DC
  Administered 2020-02-15: 14:00:00 0.6 ug/kg/h via INTRAVENOUS
  Administered 2020-02-15: 1.2 ug/kg/h via INTRAVENOUS
  Administered 2020-02-16: 08:00:00 1 ug/kg/h via INTRAVENOUS
  Administered 2020-02-16: 1.2 ug/kg/h via INTRAVENOUS
  Administered 2020-02-16: 15:00:00 1 ug/kg/h via INTRAVENOUS
  Administered 2020-02-17: 16:00:00 0.7 ug/kg/h via INTRAVENOUS
  Administered 2020-02-17: 0.5 ug/kg/h via INTRAVENOUS
  Administered 2020-02-18: 0.6 ug/kg/h via INTRAVENOUS
  Administered 2020-02-19: 01:00:00 0.7 ug/kg/h via INTRAVENOUS
  Filled 2020-02-15 (×11): qty 100

## 2020-02-15 MED ORDER — LORAZEPAM 2 MG/ML IJ SOLN
0.0000 mg | Freq: Four times a day (QID) | INTRAMUSCULAR | Status: DC
  Filled 2020-02-15: qty 1

## 2020-02-15 MED ORDER — FOLIC ACID 1 MG PO TABS
1.0000 mg | ORAL_TABLET | Freq: Every day | ORAL | Status: DC
  Administered 2020-02-15 – 2020-02-16 (×2): 1 mg via ORAL
  Filled 2020-02-15 (×2): qty 1

## 2020-02-15 MED ORDER — LEVALBUTEROL HCL 0.63 MG/3ML IN NEBU
0.6300 mg | INHALATION_SOLUTION | RESPIRATORY_TRACT | Status: DC | PRN
  Administered 2020-02-16: 12:00:00 0.63 mg via RESPIRATORY_TRACT
  Filled 2020-02-15: qty 3

## 2020-02-15 MED ORDER — THIAMINE HCL 100 MG PO TABS
100.0000 mg | ORAL_TABLET | Freq: Every day | ORAL | Status: DC
  Administered 2020-02-15: 100 mg via ORAL
  Filled 2020-02-15 (×3): qty 1

## 2020-02-15 MED ORDER — HEPARIN BOLUS VIA INFUSION
3450.0000 [IU] | Freq: Once | INTRAVENOUS | Status: AC
  Administered 2020-02-15: 3450 [IU] via INTRAVENOUS
  Filled 2020-02-15: qty 3450

## 2020-02-15 MED ORDER — LEVALBUTEROL HCL 0.63 MG/3ML IN NEBU
0.6300 mg | INHALATION_SOLUTION | Freq: Four times a day (QID) | RESPIRATORY_TRACT | Status: DC
  Administered 2020-02-15 – 2020-02-17 (×10): 0.63 mg via RESPIRATORY_TRACT
  Filled 2020-02-15 (×10): qty 3

## 2020-02-15 MED ORDER — THIAMINE HCL 100 MG/ML IJ SOLN
100.0000 mg | Freq: Every day | INTRAMUSCULAR | Status: DC
  Administered 2020-02-16 – 2020-02-17 (×2): 100 mg via INTRAVENOUS
  Filled 2020-02-15 (×2): qty 2

## 2020-02-15 MED ORDER — MIDAZOLAM HCL 2 MG/2ML IJ SOLN
INTRAMUSCULAR | Status: AC
  Filled 2020-02-15: qty 2

## 2020-02-15 MED ORDER — METOPROLOL TARTRATE 5 MG/5ML IV SOLN
INTRAVENOUS | Status: AC
  Filled 2020-02-15: qty 5

## 2020-02-15 MED ORDER — NICOTINE 21 MG/24HR TD PT24
21.0000 mg | MEDICATED_PATCH | Freq: Every day | TRANSDERMAL | Status: DC
  Administered 2020-02-15 – 2020-03-02 (×17): 21 mg via TRANSDERMAL
  Filled 2020-02-15 (×18): qty 1

## 2020-02-15 MED ORDER — IOHEXOL 350 MG/ML SOLN
100.0000 mL | Freq: Once | INTRAVENOUS | Status: AC | PRN
  Administered 2020-02-15: 100 mL via INTRAVENOUS

## 2020-02-15 MED ORDER — LORAZEPAM 1 MG PO TABS: 1.0000 mg | ORAL_TABLET | ORAL | Status: DC | PRN

## 2020-02-15 MED ORDER — MIDAZOLAM HCL 2 MG/2ML IJ SOLN: 2.0000 mg | Freq: Once | INTRAMUSCULAR | Status: DC

## 2020-02-15 MED ORDER — METRONIDAZOLE IN NACL 5-0.79 MG/ML-% IV SOLN
500.0000 mg | Freq: Three times a day (TID) | INTRAVENOUS | Status: DC
  Administered 2020-02-15 – 2020-02-17 (×6): 500 mg via INTRAVENOUS
  Filled 2020-02-15 (×6): qty 100

## 2020-02-15 MED ORDER — METHYLPREDNISOLONE SODIUM SUCC 40 MG IJ SOLR
40.0000 mg | Freq: Two times a day (BID) | INTRAMUSCULAR | Status: DC
  Administered 2020-02-15: 40 mg via INTRAVENOUS
  Filled 2020-02-15 (×2): qty 1

## 2020-02-15 MED ORDER — METOPROLOL TARTRATE 5 MG/5ML IV SOLN
2.5000 mg | Freq: Four times a day (QID) | INTRAVENOUS | Status: DC | PRN
  Administered 2020-02-18: 5 mg via INTRAVENOUS
  Administered 2020-02-20 – 2020-02-21 (×3): 2.5 mg via INTRAVENOUS
  Filled 2020-02-15 (×4): qty 5

## 2020-02-15 NOTE — Progress Notes (Signed)
  Speech Language Pathology Treatment: Dysphagia  Patient Details Name: Dawn Beasley MRN: [id number] DOB: January 05, 1951 Today's Date: 02/15/2020 Time: 7062-3762 SLP Time Calculation (min) (ACUTE ONLY): 13 min  Assessment / Plan / Recommendation Clinical Impression  F/u after yesterday's clinical swallowing evaluation.  Pt with intermittent hypoxia and increasing O2 requirements today.  Husband is at bedside. Pt is pleasant, but confused and disoriented.  She is tolerating nectar-thick liquids, and trials of thin, without overt s/s of aspiration.  She self-fed applesauce without overt concerns for aspiration. However, with eating/drinking her WOB increased and she was visibly uncomfortable.  Recommend she remain on nectar-thick liquids for now.  D/W pt, husband the necessity of taking frequent breaks when eating and giving pt time to "catch her breath" between bites/sips to allow adequate respiratory/swallowing coordination.  Pt remains on clear liquids due to prior N/V.  No further vomiting per RN.  For today, per Dr. Sharolyn Beasley, she should remain on clear liquids.  Liquids will still need to be thickened to nectar consistency.  SLP will follow.    HPI HPI: 69 yo female adm to [facility] with respiratory issues requiring intubation - Recent endoscopy 02/06/2020 showing esophagitis.  She reports post-endoscopy severe vomiting = dark colored emesis.  [name] due to frequent issues with vomiting and weight per her report.  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  Imaging studies show severe right multifocal infiltrates c/w pna.   Pt denies issues with swallowing but admits to vomiting and weight loss.      SLP Plan  Continue with current plan of care       Recommendations  Medication Administration: Whole meds with puree Supervision: Patient able to self feed Compensations: Other (Comment)(frequent rest breaks) Postural Changes and/or Swallow Maneuvers: Seated  upright 90 degrees                Oral Care Recommendations: Oral care BID SLP Visit Diagnosis: Dysphagia, unspecified (R13.10) Plan: Continue with current plan of care       GO              Dawn Beasley L. Dawn Frederic, MA CCC/SLP Acute Rehabilitation Services Office number [phone] Pager [phone]   Dawn Beasley Dawn Beasley 02/15/2020, 11:40 AM

## 2020-02-15 NOTE — Progress Notes (Signed)
eLink Physician-Brief Progress Note Patient Name: Dawn Beasley DOB: 1951/02/08 MRN: [id number]   Date of Service  02/15/2020  HPI/Events of Note  Frequent PVC's - K+ = 3.0 and Creatinine = 0.52.  eICU Interventions  Will order: 1. Replace K+. 2. Mg++ level STAT.      Intervention Category Major Interventions: Arrhythmia - evaluation and management  Colin Norment Eugene 02/15/2020, 6:44 AM

## 2020-02-15 NOTE — Progress Notes (Signed)
NAME:  Dawn Beasley, MRN:  [id number], DOB:  02/14/1951, LOS: 3 ADMISSION DATE:  02/11/2020, CONSULTATION DATE:  02/11/2020 REFERRING MD:  TRH, CHIEF COMPLAINT:  Respiratory failure   Brief History   Dawn Beasley Is a 69 y.o. woman with recent EGD for nausea, vomiting, weight loss over the last 2 years. Persistent hoarse voice after procedure. Came in from home with altered mental status and emesis. Intubated in the ED for clinical coma.   Consults:    Procedures:  Intubation 3/27 > 3/29  Significant Diagnostic Tests:  3/27 chest xray with right middle and lower lobe airspace opacities concerning for aspiration  Micro Data:  MRSA PCR negative  Antimicrobials:  Vancomycin 3/27>>3/28 Cefepime 3/27 >>3/28 Unasyn 3/28 > 3/31 Cefepime 3/31 > Flagyl 3/31 >  Interim history/subjective:  Sent out of ICU yesterday after coming off pressors and Precedex. Some episodes of hypoxia overnight. Then this morning she had an episode of poor responsiveness and profound respiratory distress. PCCM was called back and the patient was placed on BiPAP in preparation for transfer to ICU.   Objective   Blood pressure (!) 158/77, pulse (!) 128, temperature 98 F (36.7 C), temperature source Oral, resp. rate (!) 46, height 5\' 2"  (1.575 m), weight 57.3 kg, SpO2 100 %.        Intake/Output Summary (Last 24 hours) at 02/15/2020 1315 Last data filed at 02/15/2020 0400 Gross per 24 hour  Intake 1237.98 ml  Output 100 ml  Net 1137.98 ml   Filed Weights   02/13/20 0500 02/14/20 0500 02/15/20 0500  Weight: 55.2 kg 56.6 kg 57.3 kg    Examination:  General: frail elderly woman in moderate respiratory distress on BiPAP HENT: [place]/AT, PERRL, no JVD Lungs: Distant but clear, some bronchial breath sounds on the right, no wheezing Cardiovascular: Regular, distant, no murmur Abdomen: Nondistended, positive bowel sounds Extremities: No significant edema Neuro: Awake, alert, interacting appropriately, follows  commands, answers questions  Resolved Hospital Problem list   Septic shock, AKI,  Assessment & Plan:  Dawn Beasley is a 69 y.o. woman with chronic nausea, vomiting who presents with aspiration pneumonia after a recent EGD.  Acute hypoxemic respiratory failure: Successfully extubated 3/29 Aspiration pneumonia: Right sided Tobacco use disorder - 1ppd smoker Possible PE, McConell's sign noted on 3/27 echo - Start BiPAP - At risk for re-intubation - Unasyn broadened to cefepime, flagyl on 3/31 - Back to NPO status, then swallowing precautions, SLP following - Scheduled levalbuterol - CTA chest  Acute metabolic encephalopathy: Suspect at this point it is secondary to hypoxia, however, she does have a history of alcohol use and it is possible she may be suffering from withdrawal. Was initially on Precedex in ICU, but weaned of 3/29 and was moved out of ICU 3/30.   - CT head - Will D/C CIWA protocol for now until she regains stability.  - RN neurochecks  Elevated troponin - suspect demand ischemia Echocardiogram overall reassuring, mildly reduced RV free wall, question McConnell sign.  PA pressures could not be determined, RA size normal - Repeat troponin - Check EKG  History hypertension - Restart home losartan when stable to do so  Chronic emesis, gastropathy, abdominal discomfort GERD - On chronic pantoprazole 40 mg twice daily   Best practice:  Diet: Clear liquids with thickener per SLP, but now back to NPO Pain/Anxiety/Delirium protocol (if indicated): N/A VAP protocol (if indicated): NA DVT prophylaxis: Lovenox GI prophylaxis: Pantoprazole Glucose control: N/A Mobility: bed rest Code  Status: Full Family Communication: Updated patient and husband at bedside 3/31 Disposition: To ICU  Labs   CBC: Recent Labs  Lab 02/11/20 2026 02/12/20 0036 02/12/20 0529 02/13/20 0225 02/14/20 0303 02/15/20 0239 02/15/20 1035  WBC 1.3*  --  2.8* 10.0 12.0* 17.4* 20.7*    NEUTROABS 1.2*  --  2.5 8.9* 10.7* 15.2*  --   HGB 8.6*   < > 8.3* 8.0* 7.6* 8.5* 9.0*  HCT 24.6*   < > 24.6* 22.6* 22.4* 25.3* 27.4*  MCV 112.8*  --  117.7* 114.1* 116.7* 118.2* 123.4*  PLT 141*  --  156 148* 158 189 208   < > = values in this interval not displayed.    Basic Metabolic Panel: Recent Labs  Lab 02/12/20 0008 02/12/20 0036 02/12/20 7096 02/12/20 0529 02/13/20 0225 02/14/20 0303 02/15/20 0239 02/15/20 1035  NA 129*   < > 130* 130* 136 143 144  --   K 3.9   < > 3.6 3.9 3.5 3.1* 3.0*  --   CL 89*  --   --  94* 100 107 108  --   CO2 27  --   --  24 22 29 26   --   GLUCOSE 95  --   --  91 105* 133* 137*  --   BUN 47*  --   --  48* 38* 28* 18  --   CREATININE 1.39*  --   --  1.32* 0.91 0.57 0.52  --   CALCIUM 8.3*  --   --  8.1* 8.3* 8.6* 8.8*  --   MG 2.0  --   --  2.1  --   --  1.9  --   PHOS 4.8*  --   --  5.1*  --   --   --  2.2*   < > = values in this interval not displayed.   GFR: Estimated Creatinine Clearance: 54.7 mL/min (by C-G formula based on SCr of 0.52 mg/dL). Recent Labs  Lab 02/12/20 0008 02/12/20 0529 02/12/20 0715 02/13/20 0225 02/14/20 0303 02/15/20 0239 02/15/20 1035  PROCALCITON 20.86  --   --   --   --   --   --   WBC  --    < >  --  10.0 12.0* 17.4* 20.7*  LATICACIDVEN 2.3*  --  1.7  --   --   --   --    < > = values in this interval not displayed.    Liver Function Tests: Recent Labs  Lab 02/12/20 0008 02/12/20 0529 02/13/20 0225 02/14/20 0303  AST 61* 70* 68* 99*  ALT 22 23 27  46*  ALKPHOS 18* 23* 45 92  BILITOT 1.1 1.2 1.6* 0.9  PROT 5.3* 5.1* 4.9* 5.1*  ALBUMIN 2.4* 2.3* 2.0* 2.1*   No results for input(s): LIPASE, AMYLASE in the last 168 hours. No results for input(s): AMMONIA in the last 168 hours.  ABG    Component Value Date/Time   PHART 7.314 (L) 02/15/2020 1130   PCO2ART 56.2 (H) 02/15/2020 1130   PO2ART 99.2 02/15/2020 1130   HCO3 27.7 02/15/2020 1130   TCO2 26 02/12/2020 0511   ACIDBASEDEF 4.0 (H)  02/12/2020 0511   O2SAT 96.9 02/15/2020 1130     Coagulation Profile: Recent Labs  Lab 02/12/20 0008  INR 1.1    Cardiac Enzymes: Recent Labs  Lab 02/12/20 0008 02/12/20 0529  CKTOTAL 1,746* 1,775*    HbA1C: No results found for: HGBA1C  CBG: Recent  Labs  Lab 02/14/20 2123 02/14/20 2329 02/15/20 0304 02/15/20 0827 02/15/20 1305  GLUCAP 150* 139* 138* 120* 111*    Critical care time: N/a    Joneen Roach, AGACNP-BC [facility]  See Amion for personal pager PCCM on call pager [phone]  02/15/2020 1:39 PM

## 2020-02-15 NOTE — Significant Event (Signed)
Rapid Response Event Note  Overview: Time Called: 1216 Arrival Time: 1220 Event Type: Respiratory  Initial Focused Assessment: Increased WOB and shallow breathing Diaphoretic and cold mottled skin all extremities. Lung sounds with crackles and wheezes She is air hungry and restless 1mg  Ativan given at 1122 prior to my arrival  BP  178/143  HR 113 RR 46  O2 sat91 % on HFNC  Interventions: Increased O2 to 15L HFNC Dr at bedside to assess patient Patient suddenly became less responsive  Dr Sharolyn Douglas at bedside to assess patient  Placed on Bipap Transferred to 2M04    Plan of Care (if not transferred):  Event Summary: Name of Physician Notified: 07-20-1981 at 1230    at    Outcome: Transferred (Comment)  Event End Time: 1315  Andreas Newport

## 2020-02-15 NOTE — Progress Notes (Signed)
Pt transported to CT3 on bipap. Bipap removed in CT per tech request. Pt placed on NRB and tolerated well. Pt transported back to 2M04 on NRB. RT will continue to monitor.

## 2020-02-15 NOTE — Progress Notes (Signed)
PROGRESS NOTE  Dawn Beasley KXF:[id number] DOB: [birth year] DOA: 02/11/2020 PCP: Lonie Peak, PA-C  HPI/Recap of past 24 hours: HPI from PCCM 69 year old female with medical history significant for hypertension, chronic nausea/vomiting, tobacco abuse, alcohol abuse, spinal stenosis, aortic aneurysm, presents to the ED due to encephalopathy, near syncope and an episode of hematemesis. In the ED, patient was confused, hypoxic and was placed on 4 L nasal cannula. Chest x-ray showed diffuse right-sided interstitial opacities and dense consolidation in the right lower lobe concerning for aspiration pneumonia. CT head unremarkable. Patient continued to be progressively hypoxic and encephalopathic requiring intubation on 3/27. She also became hypotensive and required peripheral Levophed. PCCM was consulted for admission. Patient was subsequently extubated on 3/29, and weaned down to 4 L of oxygen. [facility] was consulted to assume care on 02/15/20.    Today, patient noted to have increased work of breathing, noted to be in respiratory distress, with worsening encephalopathy. This a.m., was able to tell me her name date of birth where she is, who the president is. Early this afternoon, was noted to be progressively worse, minimally responsive, tachypneic, tachycardic, with mottled/cool extremities. Rapid response was immediately called at bedside, PCCM also was consulted. Patient transferred to ICU for closer observation and further management.    Assessment/Plan: Active Problems:   Essential hypertension   Pancytopenia (HCC)   Sepsis (HCC)   Aspiration pneumonia (HCC)   Acute on chronic respiratory failure with hypoxia (HCC)   Hyponatremia   AKI (acute kidney injury) (HCC)   Dehydration   Rhabdomyolysis   Elevated troponin   Respiratory failure (HCC)  Acute hypoxemic respiratory failure Right aspiration pneumonia Likely history of COPD Currently requiring about 8 L of oxygen, with  noted respiratory distress Noted to be tachypneic, tachycardic, inaccurate measurement of saturation due to cool extremities Afebrile, with worsening leukocytosis ABG showed pH of 7.314, PCO2 66.2, PO2 99.2, bicarb 27.7 Chest x-ray showed bilateral pulmonary infiltrates, worse in right upper lobe, consistent with multifocal pneumonia CTA chest pending to rule out PE Status post mechanical ventilation on 3/27, extubated on 3/29 S/P Unasyn, will switch to cefepime, Flagyl, MRSA nares negative Duo nebs, IV Solu-Medrol, supplemental oxygen/BiPAP as needed SLP on board, recommend thick nectar liquids PCCM consulted, transfer back to the ICU  Septic shock ?? Likely due to above Currently weaned off of pressors BP stable, more on the high side Further management per PCCM  Acute metabolic encephalopathy Likely due to above CT head pending Further management as above  Elevated troponin Likely 2/2 demand ischemia Echo showed RV free wall hypokinesis, questionable McConnell sign CTA chest above to rule out PE  Chronic nausea/vomiting/gastropathy/GERD Outpatient EGD showed esophagitis, colonoscopy fairly unremarkable Continue pantoprazole 40 mg twice daily GI follow-up as an outpatient  History of hypertension Home losartan  History of hyperlipidemia Home pravastatin  History of tobacco/alcohol abuse Nicotine patch ordered May need Precedex now in the ICU         Malnutrition Type:  Nutrition Problem: Inadequate oral intake Etiology: poor appetite, nausea, acute illness   Malnutrition Characteristics:  Signs/Symptoms: NPO status   Nutrition Interventions:  Interventions: Refer to RD note for recommendations    Estimated body mass index is 23.1 kg/m as calculated from the following:   Height as of this encounter: 5\' 2"  (1.575 m).   Weight as of this encounter: 57.3 kg.     Code Status: Full  Family Communication: Discussed extensively with husband at  bedside  Disposition Plan: Patient  came from home, currently transferred to the ICU. Further disposition plan will be based on ICU clinical course   Consultants:  PCCM  Procedures:  None  Antimicrobials:  Cefepime  Metronidazole  DVT prophylaxis: Lovenox   Objective: Vitals:   02/15/20 0836 02/15/20 1300 02/15/20 1315 02/15/20 1330  BP: (!) 143/80 (!) 158/77  137/88  Pulse: (!) 128  (!) 113 (!) 111  Resp: 20 (!) 46 (!) 38 (!) 30  Temp: 98 F (36.7 C)  97.8 F (36.6 C)   TempSrc: Oral     SpO2: 100%  100% 99%  Weight:      Height:        Intake/Output Summary (Last 24 hours) at 02/15/2020 1411 Last data filed at 02/15/2020 0400 Gross per 24 hour  Intake 1237.98 ml  Output 100 ml  Net 1137.98 ml   Filed Weights   02/13/20 0500 02/14/20 0500 02/15/20 0500  Weight: 55.2 kg 56.6 kg 57.3 kg    Exam:  General:  Lethargic, minimally responsive, significant respiratory distress  Cardiovascular: S1, S2 present  Respiratory:  Diminished breath sounds bilaterally, with significant wheezing noted  Abdomen: Soft, nontender, nondistended, bowel sounds present  Musculoskeletal: No bilateral pedal edema noted, cool extremities  Skin:  Mottled extremities  Psychiatry: Unable to assess   Data Reviewed: CBC: Recent Labs  Lab 02/11/20 2026 02/12/20 0036 02/12/20 0529 02/13/20 0225 02/14/20 0303 02/15/20 0239 02/15/20 1035  WBC 1.3*  --  2.8* 10.0 12.0* 17.4* 20.7*  NEUTROABS 1.2*  --  2.5 8.9* 10.7* 15.2*  --   HGB 8.6*   < > 8.3* 8.0* 7.6* 8.5* 9.0*  HCT 24.6*   < > 24.6* 22.6* 22.4* 25.3* 27.4*  MCV 112.8*  --  117.7* 114.1* 116.7* 118.2* 123.4*  PLT 141*  --  156 148* 158 189 208   < > = values in this interval not displayed.   Basic Metabolic Panel: Recent Labs  Lab 02/12/20 0008 02/12/20 0036 02/12/20 6759 02/12/20 0529 02/13/20 0225 02/14/20 0303 02/15/20 0239 02/15/20 1035  NA 129*   < > 130* 130* 136 143 144  --   K 3.9   < > 3.6 3.9  3.5 3.1* 3.0*  --   CL 89*  --   --  94* 100 107 108  --   CO2 27  --   --  24 22 29 26   --   GLUCOSE 95  --   --  91 105* 133* 137*  --   BUN 47*  --   --  48* 38* 28* 18  --   CREATININE 1.39*  --   --  1.32* 0.91 0.57 0.52  --   CALCIUM 8.3*  --   --  8.1* 8.3* 8.6* 8.8*  --   MG 2.0  --   --  2.1  --   --  1.9  --   PHOS 4.8*  --   --  5.1*  --   --   --  2.2*   < > = values in this interval not displayed.   GFR: Estimated Creatinine Clearance: 54.7 mL/min (by C-G formula based on SCr of 0.52 mg/dL). Liver Function Tests: Recent Labs  Lab 02/12/20 0008 02/12/20 0529 02/13/20 0225 02/14/20 0303  AST 61* 70* 68* 99*  ALT 22 23 27  46*  ALKPHOS 18* 23* 45 92  BILITOT 1.1 1.2 1.6* 0.9  PROT 5.3* 5.1* 4.9* 5.1*  ALBUMIN 2.4* 2.3* 2.0* 2.1*   No  results for input(s): LIPASE, AMYLASE in the last 168 hours. No results for input(s): AMMONIA in the last 168 hours. Coagulation Profile: Recent Labs  Lab 02/12/20 0008  INR 1.1   Cardiac Enzymes: Recent Labs  Lab 02/12/20 0008 02/12/20 0529  CKTOTAL 1,746* 1,775*   BNP (last 3 results) No results for input(s): PROBNP in the last 8760 hours. HbA1C: No results for input(s): HGBA1C in the last 72 hours. CBG: Recent Labs  Lab 02/14/20 2123 02/14/20 2329 02/15/20 0304 02/15/20 0827 02/15/20 1305  GLUCAP 150* 139* 138* 120* 111*   Lipid Profile: No results for input(s): CHOL, HDL, LDLCALC, TRIG, CHOLHDL, LDLDIRECT in the last 72 hours. Thyroid Function Tests: No results for input(s): TSH, T4TOTAL, FREET4, T3FREE, THYROIDAB in the last 72 hours. Anemia Panel: No results for input(s): VITAMINB12, FOLATE, FERRITIN, TIBC, IRON, RETICCTPCT in the last 72 hours. Urine analysis:    Component Value Date/Time   COLORURINE AMBER (A) 02/12/2020 0127   APPEARANCEUR HAZY (A) 02/12/2020 0127   LABSPEC 1.017 02/12/2020 0127   PHURINE 5.0 02/12/2020 0127   GLUCOSEU NEGATIVE 02/12/2020 0127   HGBUR LARGE (A) 02/12/2020 0127    BILIRUBINUR NEGATIVE 02/12/2020 0127   KETONESUR NEGATIVE 02/12/2020 0127   PROTEINUR 100 (A) 02/12/2020 0127   UROBILINOGEN 0.2 08/02/2007 1052   NITRITE NEGATIVE 02/12/2020 0127   LEUKOCYTESUR NEGATIVE 02/12/2020 0127   Sepsis Labs: @LABRCNTIP (procalcitonin:4,lacticidven:4)  ) Recent Results (from the past 240 hour(s))  SARS CORONAVIRUS 2 (TAT 6-24 HRS) Nasopharyngeal Nasopharyngeal Swab     Status: None   Collection Time: 02/11/20 10:08 PM   Specimen: Nasopharyngeal Swab  Result Value Ref Range Status   SARS Coronavirus 2 NEGATIVE NEGATIVE Final    Comment: (NOTE) SARS-CoV-2 target nucleic acids are NOT DETECTED. The SARS-CoV-2 RNA is generally detectable in upper and lower respiratory specimens during the acute phase of infection. Negative results do not preclude SARS-CoV-2 infection, do not rule out co-infections with other pathogens, and should not be used as the sole basis for treatment or other patient management decisions. Negative results must be combined with clinical observations, patient history, and epidemiological information. The expected result is Negative. Fact Sheet for Patients: 02/13/20 Fact Sheet for Healthcare Providers: HairSlick.no This test is not yet approved or cleared by the [place]irigir.com FDA and  has been authorized for detection and/or diagnosis of SARS-CoV-2 by FDA under an Emergency Use Authorization (EUA). This EUA will remain  in effect (meaning this test can be used) for the duration of the COVID-19 declaration under Section 56 4(b)(1) of the Act, 21 U.S.C. section 360bbb-3(b)(1), unless the authorization is terminated or revoked sooner. Performed at [facility], [address] [place], [place]   Culture, blood (x 2)     Status: None (Preliminary result)   Collection Time: 02/12/20 12:18 AM   Specimen: BLOOD  Result Value Ref Range Status   Specimen  Description BLOOD LEFT FOREARM  Final   Special Requests   Final    BOTTLES DRAWN AEROBIC ONLY Blood Culture adequate volume   Culture   Final    NO GROWTH 3 DAYS Performed at [facility], [address], [place], [place]    Report Status PENDING  Incomplete  Culture, blood (x 2)     Status: None (Preliminary result)   Collection Time: 02/12/20 12:18 AM   Specimen: BLOOD  Result Value Ref Range Status   Specimen Description BLOOD RIGHT FOREARM  Final   Special Requests  Final    BOTTLES DRAWN AEROBIC AND ANAEROBIC Blood Culture adequate volume   Culture   Final    NO GROWTH 3 DAYS Performed at [facility], [address] [place], [place] [address]    Report Status PENDING  Incomplete  Respiratory Panel by RT PCR (Flu A&B, Covid) - Nasopharyngeal Swab     Status: None   Collection Time: 02/12/20  1:32 AM   Specimen: Nasopharyngeal Swab  Result Value Ref Range Status   SARS Coronavirus 2 by RT PCR NEGATIVE NEGATIVE Final    Comment: (NOTE) SARS-CoV-2 target nucleic acids are NOT DETECTED. The SARS-CoV-2 RNA is generally detectable in upper respiratoy specimens during the acute phase of infection. The lowest concentration of SARS-CoV-2 viral copies this assay can detect is 131 copies/mL. A negative result does not preclude SARS-Cov-2 infection and should not be used as the sole basis for treatment or other patient management decisions. A negative result may occur with  improper specimen collection/handling, submission of specimen other than nasopharyngeal swab, presence of viral mutation(s) within the areas targeted by this assay, and inadequate number of viral copies (<131 copies/mL). A negative result must be combined with clinical observations, patient history, and epidemiological information. The expected result is Negative. Fact Sheet for Patients:  https://www.moore.com/ Fact Sheet for Healthcare Providers:    https://www.young.biz/ This test is not yet ap proved or cleared by the [place] FDA and  has been authorized for detection and/or diagnosis of SARS-CoV-2 by FDA under an Emergency Use Authorization (EUA). This EUA will remain  in effect (meaning this test can be used) for the duration of the COVID-19 declaration under Section 564(b)(1) of the Act, 21 U.S.C. section 360bbb-3(b)(1), unless the authorization is terminated or revoked sooner.    Influenza A by PCR NEGATIVE NEGATIVE Final   Influenza B by PCR NEGATIVE NEGATIVE Final    Comment: (NOTE) The Xpert Xpress SARS-CoV-2/FLU/RSV assay is intended as an aid in  the diagnosis of influenza from Nasopharyngeal swab specimens and  should not be used as a sole basis for treatment. Nasal washings and  aspirates are unacceptable for Xpert Xpress SARS-CoV-2/FLU/RSV  testing. Fact Sheet for Patients: https://www.moore.com/ Fact Sheet for Healthcare Providers: https://www.young.biz/ This test is not yet approved or cleared by the [place] FDA and  has been authorized for detection and/or diagnosis of SARS-CoV-2 by  FDA under an Emergency Use Authorization (EUA). This EUA will remain  in effect (meaning this test can be used) for the duration of the  Covid-19 declaration under Section 564(b)(1) of the Act, 21  U.S.C. section 360bbb-3(b)(1), unless the authorization is  terminated or revoked. Performed at [facility], [address] [place], [place] [address]   Respiratory Panel by PCR     Status: None   Collection Time: 02/12/20  1:32 AM   Specimen: Nasopharyngeal Swab; Respiratory  Result Value Ref Range Status   Adenovirus NOT DETECTED NOT DETECTED Final   Coronavirus 229E NOT DETECTED NOT DETECTED Final    Comment: (NOTE) The Coronavirus on the Respiratory Panel, DOES NOT test for the novel  Coronavirus (2019 nCoV)    Coronavirus HKU1 NOT DETECTED NOT  DETECTED Final   Coronavirus NL63 NOT DETECTED NOT DETECTED Final   Coronavirus OC43 NOT DETECTED NOT DETECTED Final   Metapneumovirus NOT DETECTED NOT DETECTED Final   Rhinovirus / Enterovirus NOT DETECTED NOT DETECTED Final   Influenza A NOT DETECTED NOT DETECTED Final   Influenza B NOT DETECTED  NOT DETECTED Final   Parainfluenza Virus 1 NOT DETECTED NOT DETECTED Final   Parainfluenza Virus 2 NOT DETECTED NOT DETECTED Final   Parainfluenza Virus 3 NOT DETECTED NOT DETECTED Final   Parainfluenza Virus 4 NOT DETECTED NOT DETECTED Final   Respiratory Syncytial Virus NOT DETECTED NOT DETECTED Final   Bordetella pertussis NOT DETECTED NOT DETECTED Final   Chlamydophila pneumoniae NOT DETECTED NOT DETECTED Final   Mycoplasma pneumoniae NOT DETECTED NOT DETECTED Final    Comment: Performed at [facility], [address] [place], [place] [address]  MRSA PCR Screening     Status: None   Collection Time: 02/12/20  6:46 AM   Specimen: Nasal Mucosa; Nasopharyngeal  Result Value Ref Range Status   MRSA by PCR NEGATIVE NEGATIVE Final    Comment:        The GeneXpert MRSA Assay (FDA approved for NASAL specimens only), is one component of a comprehensive MRSA colonization surveillance program. It is not intended to diagnose MRSA infection nor to guide or monitor treatment for MRSA infections. Performed at [facility], [address] [place], [place] [address]       Studies: DG Chest Port 1 View  Result Date: 02/15/2020 CLINICAL DATA:  Acute respiratory failure with hypoxia, shortness of breath EXAM: PORTABLE CHEST 1 VIEW COMPARISON:  Portable exam 0554 hours compared to 02/12/2020 FINDINGS: Intraspinal stimulator identified at caudal thoracic spine. Interval removal of endotracheal, nasogastric, and LEFT thoracostomy tubes. Enlargement of cardiac silhouette. Atherosclerotic calcification aorta. Prominence of LEFT hilum unchanged. BILATERAL pulmonary infiltrates greatest in  RIGHT upper lobe. Small BILATERAL pleural effusions. No pneumothorax or acute osseous findings. IMPRESSION: BILATERAL pulmonary infiltrates greatest in RIGHT upper lobe consistent with multifocal pneumonia. Small bibasilar pleural effusions. Electronically Signed   By: Ulyses SouthwardMark  Boles M.D.   On: 02/15/2020 08:53    Scheduled Meds: . Chlorhexidine Gluconate Cloth  6 each Topical Daily  . enoxaparin (LOVENOX) injection  30 mg Subcutaneous Q24H  . folic acid  1 mg Oral Daily  . levalbuterol  0.63 mg Nebulization Q6H  . mouth rinse  15 mL Mouth Rinse BID  . metoprolol tartrate      . multivitamin with minerals  1 tablet Oral Daily  . nicotine  21 mg Transdermal Daily  . pantoprazole  40 mg Oral BID  . thiamine  100 mg Oral Daily   Or  . thiamine  100 mg Intravenous Daily    Continuous Infusions: . sodium chloride Stopped (02/14/20 0721)  . ceFEPime (MAXIPIME) IV 2 g (02/15/20 1402)  . dexmedetomidine (PRECEDEX) IV infusion 0.6 mcg/kg/hr (02/15/20 1408)  . dextrose 5 % and 0.9% NaCl 50 mL/hr at 02/14/20 0800  . metronidazole       LOS: 3 days     Briant CedarNkeiruka J Gina Leblond, MD [facility]  If 7PM-7AM, please contact night-coverage [email or web address] 02/15/2020, 2:11 PM

## 2020-02-15 NOTE — Progress Notes (Addendum)
Pharmacy Antibiotic Note  Dawn Beasley is a 69 y.o. female admitted on 02/11/2020 with pneumonia and sepsis.  Pharmacy has been consulted for Cefepime dosing.  Patient was transitioned from Cefepime and Vancomycin to Unasyn on 3/28. Now with worsening leukocytosis (WBC up to 20.7, trend was without steroids as first dose is today) and new bilateral infiltrates on CXR consistent with pneumonia. Patient remains afebrile and SCr has improved to within normal limits. Patient is tachycardic. SBP is elevated in 140s. Respiratory status has worsened with current requirement of 7L HFNC.   Plan: Restart Cefepime 2g IV every 12 hours.  Monitor renal function, culture results, and clinical status.  Follow-up length of therapy  Height: 5\' 2"  (157.5 cm) Weight: 126 lb 5.2 oz (57.3 kg) IBW/kg (Calculated) : 50.1  Temp (24hrs), Avg:98 F (36.7 C), Min:97.8 F (36.6 C), Max:98.2 F (36.8 C)  Recent Labs  Lab 02/11/20 2026 02/12/20 0008 02/12/20 0529 02/12/20 0715 02/13/20 0225 02/14/20 0303 02/15/20 0239 02/15/20 1035  WBC   < >  --  2.8*  --  10.0 12.0* 17.4* 20.7*  CREATININE  --  1.39* 1.32*  --  0.91 0.57 0.52  --   LATICACIDVEN  --  2.3*  --  1.7  --   --   --   --    < > = values in this interval not displayed.    Estimated Creatinine Clearance: 54.7 mL/min (by C-G formula based on SCr of 0.52 mg/dL).    Allergies  Allergen Reactions  . Celebrex [Celecoxib] Swelling    Throat swelling  . Sulfa Antibiotics Swelling    Throat swelling    Antimicrobials this admission: Ceftriaxone 3/27 x1 Azithromycin 3/28 x1 Flagyl 3/28 x1 Cefepime 3/28>>3/28 Vancomycin 3/28>>3/28 Unasyn 3/28>>3/31 Cefepime 3/31 >>  Dose adjustments this admission:   Microbiology results: 3/28 Blood >> no growth x2 days 3/28 MRSA PCR negative 3/28 Resp viral panel negative 3/28 COVID/Flu negative 3/28 COVID swab negative 3/27 Sputum >> sent but no results available  Thank you for allowing pharmacy  to be a part of this patient's care.  4/27, PharmD, BCPS, BCCCP Clinical Pharmacist Please refer to [facility] for [facility] Pharmacy numbers 02/15/2020 11:57 AM

## 2020-02-15 NOTE — Progress Notes (Signed)
Bilateral lower extremity venous duplex exam completed.  Preliminary results can be found under CV proc under chart review.  02/15/2020 5:52 PM  Tyson Masin, K., RDMS, RVT

## 2020-02-15 NOTE — Progress Notes (Signed)
ANTICOAGULATION CONSULT NOTE - Follow Up Consult  Pharmacy Consult for heparin Indication: pulmonary embolus  Labs: Recent Labs    02/13/20 0225 02/13/20 0225 02/14/20 0303 02/14/20 0303 02/15/20 0239 02/15/20 1035 02/15/20 1409 02/15/20 1801 02/15/20 2328  HGB 8.0*   < > 7.6*   < > 8.5* 9.0*  --   --   --   HCT 22.6*   < > 22.4*  --  25.3* 27.4*  --   --   --   PLT 148*   < > 158  --  189 208  --   --   --   HEPARINUNFRC  --   --   --   --   --   --   --   --  0.39  CREATININE 0.91  --  0.57  --  0.52  --   --   --   --   TROPONINIHS  --   --   --   --   --   --  1,369* 1,211*  --    < > = values in this interval not displayed.    Assessment/Plan:  69yo female therapeutic on heparin with initial dosing for PE. Will continue gtt at current rate and confirm stable with am labs.   Vernard Gambles, PharmD, BCPS  02/15/2020,11:55 PM

## 2020-02-15 NOTE — Progress Notes (Signed)
Noted results from ctpa and cth.   cth without acute findings.  ctpa with Rsided pe.  Heparin gtt order just placed by [facility] Will add LE venous doppler.

## 2020-02-15 NOTE — Progress Notes (Signed)
ANTICOAGULATION CONSULT NOTE - Initial Consult  Pharmacy Consult for Heparin Indication: pulmonary embolus  Allergies  Allergen Reactions  . Celebrex [Celecoxib] Swelling    Throat swelling  . Sulfa Antibiotics Swelling    Throat swelling    Patient Measurements: Height: 5\' 2"  (157.5 cm) Weight: 126 lb 5.2 oz (57.3 kg) IBW/kg (Calculated) : 50.1 Heparin Dosing Weight: 57.3 kg  Vital Signs: Temp: 98.1 F (36.7 C) (03/31 1517) Temp Source: Axillary (03/31 1517) BP: 143/93 (03/31 1500) Pulse Rate: 99 (03/31 1600)  Labs: Recent Labs    02/13/20 0225 02/13/20 0225 02/14/20 0303 02/14/20 0303 02/15/20 0239 02/15/20 1035 02/15/20 1409  HGB 8.0*   < > 7.6*   < > 8.5* 9.0*  --   HCT 22.6*   < > 22.4*  --  25.3* 27.4*  --   PLT 148*   < > 158  --  189 208  --   CREATININE 0.91  --  0.57  --  0.52  --   --   TROPONINIHS  --   --   --   --   --   --  1,369*   < > = values in this interval not displayed.    Estimated Creatinine Clearance: 54.7 mL/min (by C-G formula based on SCr of 0.52 mg/dL).   Medical History: Past Medical History:  Diagnosis Date  . AAA (abdominal aortic aneurysm) (HCC)   . Anxiety   . Arthritis   . Back pain   . Cervical disc herniation   . Depression   . 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 Hypercholesteremia   . Hypertension   . Panic disorder   . Stenosis, spinal, lumbar     Assessment: 69 yr old female with medical history significant for GERD, wt loss, esophagitis, AAA, HTN, HLD was admitted on 02/12/20 for sepsis due to aspiration PNA. Pt had recent EGD/colonoscopy and was found to have gastritis, with recent episode of hematemesis. Today, pt noted to be in respiratory distress, with increased work of breathing and worsening encephalopathy. Chest CR this afternoon showed acute PE within segmental branch of RLL pulmonary artery. Pharmacy is consulted to dose heparin for new PE. Pt was on no anticoagulation PTA.  Pt has been receiving Lovenox  30 mg SQ day for VTE prophylaxis (last dose at 0901 AM today). H/H 9.0/27.4, platelets 208 (CBC ~stable).    Goal of Therapy:  Heparin level 0.3-0.7 units/ml Monitor platelets by anticoagulation protocol: Yes   Plan:  Heparin 3450 units IV bolus, followed by heparin infusion at 950 units/hr Check 6-hr heparin level Monitor daily heparin level, CBC Monitor for signs/symptoms of bleeding (recent episode of hematemesis)  01-29-1971, PharmD, BCPS, [facility] Clinical Pharmacist 02/15/2020,4:34 PM

## 2020-02-15 NOTE — Progress Notes (Signed)
Abg obtained and sent to lab. Iver Nestle from lab was notified.

## 2020-02-16 DIAGNOSIS — R778 Other specified abnormalities of plasma proteins: Secondary | ICD-10-CM | POA: Diagnosis not present

## 2020-02-16 DIAGNOSIS — I10 Essential (primary) hypertension: Secondary | ICD-10-CM | POA: Diagnosis not present

## 2020-02-16 DIAGNOSIS — D61818 Other pancytopenia: Secondary | ICD-10-CM | POA: Diagnosis not present

## 2020-02-16 DIAGNOSIS — J690 Pneumonitis due to inhalation of food and vomit: Secondary | ICD-10-CM | POA: Diagnosis not present

## 2020-02-16 LAB — GLUCOSE, CAPILLARY
Glucose-Capillary: 91 mg/dL (ref 70–99)
Glucose-Capillary: 102 mg/dL — ABNORMAL HIGH (ref 70–99)
Glucose-Capillary: 141 mg/dL — ABNORMAL HIGH (ref 70–99)
Glucose-Capillary: 146 mg/dL — ABNORMAL HIGH (ref 70–99)
Glucose-Capillary: 122 mg/dL — ABNORMAL HIGH (ref 70–99)

## 2020-02-16 LAB — COMPREHENSIVE METABOLIC PANEL
ALT: 51 U/L — ABNORMAL HIGH (ref 0–44)
AST: 40 U/L (ref 15–41)
Albumin: 2.4 g/dL — ABNORMAL LOW (ref 3.5–5.0)
Alkaline Phosphatase: 94 U/L (ref 38–126)
Anion gap: 12 (ref 5–15)
BUN: 24 mg/dL — ABNORMAL HIGH (ref 8–23)
CO2: 28 mmol/L (ref 22–32)
Calcium: 8.8 mg/dL — ABNORMAL LOW (ref 8.9–10.3)
Chloride: 111 mmol/L (ref 98–111)
Creatinine, Ser: 0.51 mg/dL (ref 0.44–1.00)
GFR calc Af Amer: >60 mL/min (ref >60)
GFR calc non Af Amer: >60 mL/min (ref >60)
Glucose, Bld: 126 mg/dL — ABNORMAL HIGH (ref 70–99)
Potassium: 4.7 mmol/L (ref 3.5–5.1)
Sodium: 151 mmol/L — ABNORMAL HIGH (ref 135–145)
Total Bilirubin: 1 mg/dL (ref 0.3–1.2)
Total Protein: 5.5 g/dL — ABNORMAL LOW (ref 6.5–8.1)

## 2020-02-16 LAB — CBC WITH DIFFERENTIAL/PLATELET
Abs Immature Granulocytes: 0.93 10*3/uL — ABNORMAL HIGH (ref 0.00–0.07)
Basophils Absolute: 0.1 10*3/uL (ref 0.0–0.1)
Basophils Relative: 0 %
Eosinophils Absolute: 0 10*3/uL (ref 0.0–0.5)
Eosinophils Relative: 0 %
HCT: 26.7 % — ABNORMAL LOW (ref 36.0–46.0)
Hemoglobin: 8.6 g/dL — ABNORMAL LOW (ref 12.0–15.0)
Immature Granulocytes: 6 %
Lymphocytes Relative: 5 %
Lymphs Abs: 0.7 10*3/uL (ref 0.7–4.0)
MCH: 39.8 pg — ABNORMAL HIGH (ref 26.0–34.0)
MCHC: 32.2 g/dL (ref 30.0–36.0)
MCV: 123.6 fL — ABNORMAL HIGH (ref 80.0–100.0)
Monocytes Absolute: 0.9 10*3/uL (ref 0.1–1.0)
Monocytes Relative: 5 %
Neutro Abs: 13.4 10*3/uL — ABNORMAL HIGH (ref 1.7–7.7)
Neutrophils Relative %: 84 %
Platelets: 218 10*3/uL (ref 150–400)
RBC: 2.16 MIL/uL — ABNORMAL LOW (ref 3.87–5.11)
RDW: 14.6 % (ref 11.5–15.5)
WBC: 16 10*3/uL — ABNORMAL HIGH (ref 4.0–10.5)
nRBC: 0.5 % — ABNORMAL HIGH (ref 0.0–0.2)

## 2020-02-16 LAB — HEPARIN LEVEL (UNFRACTIONATED)
Heparin Unfractionated: 0.25 IU/mL — ABNORMAL LOW (ref 0.30–0.70)
Heparin Unfractionated: 0.33 IU/mL (ref 0.30–0.70)
Heparin Unfractionated: 0.32 IU/mL (ref 0.30–0.70)

## 2020-02-16 LAB — PHOSPHORUS
Phosphorus: 3 mg/dL (ref 2.5–4.6)
Phosphorus: 2.9 mg/dL (ref 2.5–4.6)

## 2020-02-16 LAB — MAGNESIUM
Magnesium: 2 mg/dL (ref 1.7–2.4)
Magnesium: 2 mg/dL (ref 1.7–2.4)

## 2020-02-16 MED ORDER — PRO-STAT SUGAR FREE PO LIQD: 30.0000 mL | Freq: Two times a day (BID) | ORAL | Status: DC

## 2020-02-16 MED ORDER — METHYLPREDNISOLONE SODIUM SUCC 125 MG IJ SOLR
60.0000 mg | Freq: Two times a day (BID) | INTRAMUSCULAR | Status: DC
  Administered 2020-02-16 – 2020-02-17 (×3): 60 mg via INTRAVENOUS
  Filled 2020-02-16 (×3): qty 2

## 2020-02-16 MED ORDER — WHITE PETROLATUM EX OINT
TOPICAL_OINTMENT | CUTANEOUS | Status: AC
  Administered 2020-02-16: 0.2
  Filled 2020-02-16: qty 28.35

## 2020-02-16 MED ORDER — IPRATROPIUM BROMIDE 0.02 % IN SOLN
0.5000 mg | Freq: Four times a day (QID) | RESPIRATORY_TRACT | Status: DC
  Administered 2020-02-16 – 2020-02-17 (×6): 0.5 mg via RESPIRATORY_TRACT
  Filled 2020-02-16 (×6): qty 2.5

## 2020-02-16 MED ORDER — VITAL HIGH PROTEIN PO LIQD: 1000.0000 mL | ORAL | Status: DC

## 2020-02-16 MED ORDER — INSULIN ASPART 100 UNIT/ML ~~LOC~~ SOLN
0.0000 [IU] | SUBCUTANEOUS | Status: DC
  Administered 2020-02-16 – 2020-02-18 (×9): 1 [IU] via SUBCUTANEOUS
  Administered 2020-02-18 (×2): 2 [IU] via SUBCUTANEOUS
  Administered 2020-02-18 (×3): 1 [IU] via SUBCUTANEOUS
  Administered 2020-02-19: 2 [IU] via SUBCUTANEOUS
  Administered 2020-02-19 (×2): 1 [IU] via SUBCUTANEOUS
  Administered 2020-02-20: 2 [IU] via SUBCUTANEOUS
  Administered 2020-02-20: 12:00:00 1 [IU] via SUBCUTANEOUS
  Administered 2020-02-20: 2 [IU] via SUBCUTANEOUS
  Administered 2020-02-20 – 2020-02-21 (×3): 1 [IU] via SUBCUTANEOUS
  Administered 2020-02-22: 2 [IU] via SUBCUTANEOUS
  Administered 2020-02-23 – 2020-02-25 (×2): 1 [IU] via SUBCUTANEOUS
  Administered 2020-02-27: 2 [IU] via SUBCUTANEOUS
  Administered 2020-02-27 – 2020-02-28 (×4): 1 [IU] via SUBCUTANEOUS
  Administered 2020-02-28: 2 [IU] via SUBCUTANEOUS

## 2020-02-16 MED ORDER — DEXTROSE 5 % IV SOLN: INTRAVENOUS | Status: DC

## 2020-02-16 NOTE — Progress Notes (Signed)
NAME:  Dawn Beasley, MRN:  [id number], DOB:  July 28, 1951, LOS: 4 ADMISSION DATE:  02/11/2020, CONSULTATION DATE:  02/15/20 REFERRING MD:  Dr. Horris Latino, CHIEF COMPLAINT:  Acute Hypoxic Respiratory Failure in setting of acute semental PE and multifocal PNA  Brief History   CELESTA Beasley a 69 y.o.woman with significant past medical history of HTN, chronic nausea and vomiting, tobacco use, alcohol abuse, spinal stenosis, and an aortic aneurysm who was  admitted on 3/27 for acute hypoxic respiratory failure and aspiration pneumonia after a recent EGD for nausea, vomiting, weight loss over the last 2 years. Persistent hoarse voice after procedure.Came in from home with altered mental status and emesis. Intubated in the ED for clinical coma.She was recently transferred from the MICU to the floor on 3/30.   Significant Hospital Events   Rapid response 3/31  Consults:  N/A  Procedures:  Intubation 3/27 > 3/29  Significant Diagnostic Tests:  3/27 CXR with right middle and lower lobe airspace opacities concerning for aspiration 3/31 CXR with bilateral pulmonary infiltrates, worse in RUL, consistent with multifocal PNA 3/31 Chest CTA with focal embolus within segmental branch of RLL pulmonary artery w/o evidence of right heart strain  Micro Data:  MRSA PCR negative  Antimicrobials:  Vancomycin 3/27>>3/28 Cefepime 3/27 >>3/28 Unasyn 3/28 > 3/31 Cefepime 3/31 > Flagyl 3/31 >  Interim history/subjective:  Dawn Beasley was transferred from the floor to the ICU on 3/30. Once on the floor she was found to be tachycardic, although this was not a new finding. She appeared comfortable and in NAD with A&O x 4. Overnight she bean to have persistent episodes of hypoxia with O2 desaturation into the 70s whenever she attempted to sit upright. O2 requirement was increased from 4 to 6L and O2 saturation improved to the 90s. However, O2 sats continued to fluctuate and she remained tachycardic between the  110s to high 140s. On 3/31 AM she was found to have frequent PVCs on telemetry with a [K+] 3.0 and Cr 0.52. K+ was repleted and [Mg++] was found to be wnl. ABG showed pH 7.314, pCO2 56.2, pO2 99.2 on 48% FiO2, and bicarb 27.7. She was also found to have elevated troponins at 1369, up from 605 4 days ago.   At 3/31 PM she began to decline as she became increasingly more tachycardic, tachypneic, and had mottled ski with cool extremities on exam. Rapid response was called and she was found to be in respiratory distress with crackles and wheezing on exam. 1 mg Ativan administered given restlessness, increased WOB, shallow breathing, and diaphoresis. Vital signs at this time were BP 178/143, HR 113, RR 46, O2 at 91% on HFNC. Patient continued to decline and suddenly became unresponsive. She was then placed on BiPAP. CT head obtained was unremarkable, however her CXR was notable for bilateral pulmonary that were worse in RUL, consistent with multifocal PNA. Chest CTA was significant for a focal embolus within the segmental branch of RLL pulmonary artery w/o evidence of right heart strain. LE dopplers were unremarkable. She was given a Heparin bolus of 3450U prior to being transferred back to the ICU. She was also started on cefepime and Flagyl for her multifocal PNA and a Levabuterol nebulizer for wheezing.  Today, her breathing has significantly improved on 100% FiO2 NRB with no significant or acute events overnight. Mild cough appreciated during morning interview, though with only 1 occurrence. She denies pain or difficulty breathing and is conversational today as she is alert and oriented  x4 (to person, birthday, location, and president). She denies chest pain. She has an appetite and has been consuming some food from her liquid diet since her transfer back to the ICU. She also inquired if she could drink water as she was "very thirsty".    Objective   Blood pressure 115/68, pulse 95, temperature 97.7 F (36.5  C), temperature source Axillary, resp. rate (!) 32, height 5\' 2"  (1.575 m), weight 57.2 kg, SpO2 100 %.    Vent Mode: BIPAP FiO2 (%):  [50 %-100 %] 100 % Set Rate:  [8 bmp] 8 bmp PEEP:  [7 cmH20] 7 cmH20   Intake/Output Summary (Last 24 hours) at 02/16/2020 1011 Last data filed at 02/16/2020 0600 Gross per 24 hour  Intake 1672.94 ml  Output 300 ml  Net 1372.94 ml   Filed Weights   02/15/20 0500 02/16/20 0336 02/16/20 0800  Weight: 57.3 kg 57.2 kg 57.2 kg    Examination: General: Awake and alert, mildly agitated but in NAD, can answer all questions HENT: Normocephalic atraumatic, PEERL Lungs: Slight expiratory wheeze, but otherwise good airflow Cardiovascular: Distant heart sounds with normal S1/S2, no murmurs, rubs, or gallops Extremities: No LE edema bilaterally Skin: No skin mottling or excess warmth, no mild bruising appreciated on bilateral anterior tibia  Resolved Hospital Problem list   AKI Septic shock 3/31  Assessment & Plan:  Dawn Beasley is a 69 y.o. woman with chronic nausea, vomiting who initially presented with aspiration pneumonia after a recent EGD.  Acute Hypoxemic Respiratory Failure: Likely 2/2 Acute Right Segmental PE, Multifocal Pneumonia, and possible COPD exacerbation given history of tobacco use indicative of a possible multifactorial process. PNA and COPD may be larger contributors to her respiratory failure as PE is segmental.  - Continue 100% FiO2 NRB and wean as tolerated - Continue D5 NS 100 cc/hr - SLP following, appreciate recs - Given poor PO prior to admission, high aspiration risk and overall clinical status, will begin NGT feeds  Acute Right Segmental Pulmonary Artery: Found on CTA. Respiratory status currently stable on 100% FiO2 NRB though RR 32 today. - Continue 100% FiO2 NRB and wean as tolerated - Continue Heparin GTT 1050U/hr - Will consider repeat echo at f/u appointment once PE has been adequately treated and resolves  Multifocal  Pneumonia: Found on CXR and Chest CTA. Originally being treated for aspiration pneumonia with Unasyn, but completed 5 day course on 3/31. - D/C'd Unasyn on 3/31 (5 day course starting on 3/27) - Switched to Cefepime 2g IV and Flagyl 500 mg IV which we will continue - Daily CBC and BMP - Daily CXR  Acute COPD Exacerbation: Lifelong smoker now with wheezing. Possible component of her multiple bouts of acute hypoxemic respiratory failure and pneumonia, however she is without a formal diagnosis. Given cough, wheezing, pneumonia, and acute respiratory failure, will treat as acute COPD exacerbation. - Continue abx course of Cefepime and Flagyl - Start Solumedrol 60 mg IV BID - Start Atrovent Neb 0.5 mg q6h - Continue Levabuterol neb 0.63mg  q6h - Continue Levabuterol neb 0.63mg  q2h PRN - Will encourage outpatient follow up for COPD - Will discuss sending home with albuterol inhaler  Acute Metabolic Encephalopathy: Likely due to above. Off pressors and VSS today. Patient also has history of questionable alcohol use as well as unspecified prescriptions for opioids and benzodiazepines, concerning for more of a toxic cause of encephalopathy. Encephalopathy has improved from day prior after starting Precedex (he had waxing and waning consciousness and significant  agitation). Today she is A&O x4 with mild agitation (pulling on IV lines and attempting to remove NRB). - Manage above - Continue Precedex 4 mcg/mL IV - Ativan PRN  Elevated Troponins: Now down-trending from 1369 to 1211. Likely 2/2 demand ischemia due to recent diagnosis of PE and multifocal PNA.  - Continue to monitor  Hypernatremia: Likely due to loss of hypotonic fluid 2/2 dehydration. Could also be due to impaired access to free water as she was intubated between 3/27 - 3/29, had a recent change in mental status due to metabolic encephalopathy yesterday and is elderly. - Continue D5 NS at 100 cc/hr for now  - Continue to  monitor  Macrocytic Anemia: Chronic. Hb stable between 8-9 - Continue folate 1 mg PO   History of Hypertension: - Restart home losartan when stable to do so  Chronic emesis, gastropathy, abdominal discomfort GERD - On chronic pantoprazole 40 mg twice daily  Best practice:  Diet: NGT Pain/Anxiety/Delirium protocol (if indicated): Ativan VAP protocol (if indicated): N/A DVT prophylaxis: Heparin GI prophylaxis: Pantoprazole Glucose control: N/A Mobility: Bed rest Code Status: Full Family Communication: Updated patient and husband at bedside 4/1 Disposition: ICU  Labs   CBC: Recent Labs  Lab 02/12/20 0529 02/12/20 0529 02/13/20 0225 02/14/20 0303 02/15/20 0239 02/15/20 1035 02/16/20 0350  WBC 2.8*   < > 10.0 12.0* 17.4* 20.7* 16.0*  NEUTROABS 2.5  --  8.9* 10.7* 15.2*  --  13.4*  HGB 8.3*   < > 8.0* 7.6* 8.5* 9.0* 8.6*  HCT 24.6*   < > 22.6* 22.4* 25.3* 27.4* 26.7*  MCV 117.7*   < > 114.1* 116.7* 118.2* 123.4* 123.6*  PLT 156   < > 148* 158 189 208 218   < > = values in this interval not displayed.    Basic Metabolic Panel: Recent Labs  Lab 02/12/20 0008 02/12/20 0036 02/12/20 1610 02/13/20 0225 02/14/20 0303 02/15/20 0239 02/15/20 1035 02/16/20 0350  NA 129*   < > 130* 136 143 144  --  151*  K 3.9   < > 3.9 3.5 3.1* 3.0*  --  4.7  CL 89*   < > 94* 100 107 108  --  111  CO2 27   < > 24 22 29 26   --  28  GLUCOSE 95   < > 91 105* 133* 137*  --  126*  BUN 47*   < > 48* 38* 28* 18  --  24*  CREATININE 1.39*   < > 1.32* 0.91 0.57 0.52  --  0.51  CALCIUM 8.3*   < > 8.1* 8.3* 8.6* 8.8*  --  8.8*  MG 2.0  --  2.1  --   --  1.9  --   --   PHOS 4.8*  --  5.1*  --   --   --  2.2*  --    < > = values in this interval not displayed.   GFR: Estimated Creatinine Clearance: 54.7 mL/min (by C-G formula based on SCr of 0.51 mg/dL). Recent Labs  Lab 02/12/20 0008 02/12/20 0529 02/12/20 0715 02/13/20 0225 02/14/20 0303 02/15/20 0239 02/15/20 1035 02/16/20 0350   PROCALCITON 20.86  --   --   --   --   --   --   --   WBC  --    < >  --    < > 12.0* 17.4* 20.7* 16.0*  LATICACIDVEN 2.3*  --  1.7  --   --   --   --   --    < > =  values in this interval not displayed.    Liver Function Tests: Recent Labs  Lab 02/12/20 0008 02/12/20 0529 02/13/20 0225 02/14/20 0303 02/16/20 0350  AST 61* 70* 68* 99* 40  ALT 22 23 27  46* 51*  ALKPHOS 18* 23* 45 92 94  BILITOT 1.1 1.2 1.6* 0.9 1.0  PROT 5.3* 5.1* 4.9* 5.1* 5.5*  ALBUMIN 2.4* 2.3* 2.0* 2.1* 2.4*   No results for input(s): LIPASE, AMYLASE in the last 168 hours. No results for input(s): AMMONIA in the last 168 hours.  ABG    Component Value Date/Time   PHART 7.314 (L) 02/15/2020 1130   PCO2ART 56.2 (H) 02/15/2020 1130   PO2ART 99.2 02/15/2020 1130   HCO3 27.7 02/15/2020 1130   TCO2 26 02/12/2020 0511   ACIDBASEDEF 4.0 (H) 02/12/2020 0511   O2SAT 96.9 02/15/2020 1130     Coagulation Profile: Recent Labs  Lab 02/12/20 0008  INR 1.1    Cardiac Enzymes: Recent Labs  Lab 02/12/20 0008 02/12/20 0529  CKTOTAL 1,746* 1,775*    HbA1C: No results found for: HGBA1C  CBG: Recent Labs  Lab 02/15/20 1516 02/15/20 2000 02/15/20 2322 02/16/20 0316 02/16/20 0751  GLUCAP 100* 110* 109* 91 102*    Review of Systems:   See interim history, otherwise ROS negative  Past Medical History  She,  has a past medical history of AAA (abdominal aortic aneurysm) (HCC), Anxiety, Arthritis, Back pain, Cervical disc herniation, Depression, 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   Surgical History    Past Surgical History:  Procedure Laterality Date  . APPENDECTOMY    . BACK SURGERY    . CARPAL TUNNEL RELEASE    . LUMBAR LAMINECTOMY/DECOMPRESSION MICRODISCECTOMY N/A 04/12/2014   Procedure: MICRO LUMBAR DECOMPRESSION LUMBAR THREE TO FOUR,LUMBAR  FOUR TO FIVE;  Surgeon: 04/14/2014, MD;  Location: [facility];  Service:  Orthopedics;  Laterality: N/A;  . MAXOFACIAL SURGERY    . SPINAL CORD STIMULATOR INSERTION N/A 12/06/2015   Procedure: LUMBAR SPINAL CORD STIMULATOR INSERTION;  Surgeon: 12/08/2015, MD;  Location: [facility] OR;  Service: Orthopedics;  Laterality: N/A;     Social History   reports that she has been smoking. She has a 20.00 pack-year smoking history. She does not have any smokeless tobacco history on file. She reports current alcohol use. She reports that she does not use drugs.   Family History   Her family history includes Cancer in her sister.   Allergies Allergies  Allergen Reactions  . Celebrex [Celecoxib] Swelling    Throat swelling  . Sulfa Antibiotics Swelling    Throat swelling     Home Medications  Prior to Admission medications   Medication Sig Start Date End Date Taking? Authorizing Provider  ALPRAZolam Venita Lick) 1 MG tablet Take 0.5 tablets (0.5 mg total) by mouth 2 (two) times daily. 12/25/16  Yes Riccio, Angela C, DO  finasteride (PROSCAR) 5 MG tablet Take 5 mg by mouth daily.   Yes [provider]  losartan (COZAAR) 100 MG tablet Take 100 mg by mouth daily.   Yes [provider]  ondansetron (ZOFRAN) 4 MG tablet Take 1 tablet (4 mg total) by mouth every 8 (eight) hours as needed for nausea or vomiting. 12/06/15  Yes 12/08/15, MD  oxyCODONE-acetaminophen (PERCOCET) 10-325 MG tablet Take 1 tablet by mouth every 4 (four) hours as needed for pain. 12/06/15  Yes 12/08/15, MD  pantoprazole (PROTONIX) 40 MG tablet Take 1 tablet (40  mg total) by mouth 2 (two) times daily. 02/06/20  Yes Meryl DareStark, Malcolm T, MD  pravastatin (PRAVACHOL) 40 MG tablet Take 40 mg by mouth daily. 12/08/16  Yes [provider]  traZODone (DESYREL) 100 MG tablet Take 100 mg by mouth at bedtime as needed for sleep.   Yes [provider]  gabapentin (NEURONTIN) 600 MG tablet Take 1 tablet (600 mg total) by mouth 3 (three) times daily. Patient not taking: Reported on  02/12/2020 12/25/16   Tillman Sersiccio, Angela C, DO     Critical care time: N/A    Written by Jeanella Flatteryhris Okorieoha, MS4

## 2020-02-16 NOTE — Progress Notes (Signed)
   RT asking for order of BiPAP for slight increased WOB.  Currently on HFNC at 6 L.  Was on BiPAP intermittently overnight and this morning.  Patient actually asking for it to help her breathe.  No contraindications.   P:  BiPAP prn       Posey Boyer, MSN, AGACNP-BC [facility] 02/16/2020, 8:27 PM

## 2020-02-16 NOTE — Progress Notes (Addendum)
NAME:  Dawn Beasley, MRN:  [id number], DOB:  1951-01-12, LOS: 4 ADMISSION DATE:  02/11/2020, CONSULTATION DATE:  02/11/2020 REFERRING MD:  TRH, CHIEF COMPLAINT:  Respiratory failure   Brief History   Dawn Beasley Is a 69 y.o. woman with recent EGD for nausea, vomiting, weight loss over the last 2 years. Persistent hoarse voice after procedure. Came in from home with altered mental status and emesis. Intubated in the ED for clinical coma.   Consults:    Procedures:  Intubation 3/27 > 3/29  Significant Diagnostic Tests:  3/27 chest xray with right middle and lower lobe airspace opacities concerning for aspiration CTA chest 3/31 > acute pulmonary embolus within segmental branch of the right lower lobe. Interval development of bilateral pleural effusions L > R. Dense opacification bilaterally.  CT head 3/31 > No evidence of acute intracranial abnormality. Stable chronic small vessel ischemic disease. 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 Echo 3/29 > LVEF 65-70 %. Mild reduced RVSF. ? Of McConnells sign.   Micro Data:  MRSA PCR negative  Antimicrobials:  Vancomycin 3/27 > 3/28 Cefepime 3/27 > 3/28 Unasyn 3/28 > 3/31 Cefepime 3/31 > Flagyl 3/31 >  Interim history/subjective:  Back to ICU yesterday for encephalopathy and hypoxia. Currently remains in some distress on NRB.   Objective   Blood pressure 115/68, pulse 95, temperature 97.7 F (36.5 C), temperature source Axillary, resp. rate (!) 32, height 5\' 2"  (1.575 m), weight 57.2 kg, SpO2 100 %.    Vent Mode: BIPAP FiO2 (%):  [50 %-100 %] 100 % Set Rate:  [8 bmp] 8 bmp PEEP:  [7 cmH20] 7 cmH20   Intake/Output Summary (Last 24 hours) at 02/16/2020 1028 Last data filed at 02/16/2020 0600 Gross per 24 hour  Intake 1672.94 ml  Output 300 ml  Net 1372.94 ml   Filed Weights   02/15/20 0500 02/16/20 0336 02/16/20 0800  Weight: 57.3 kg 57.2 kg 57.2 kg    Examination:  General: frail  elderly female in mild respiratory distress HENT: [place]/AT, PERRL, no JVD Lungs: Coarse expiratory wheeze Cardiovascular: RRR, no MRG. No edema.  Abdomen: Soft, non-distended, non-tender.  Extremities: No significant edema. NO acute deformity. Equal strength bilaterally.  Neuro: Awake, alert. Follows commands. Lethargic, but on sedation. RASS is 0 to -1 range.   Resolved Hospital Problem list   Septic shock, AKI,   Assessment & Plan:   Acute hypoxemic respiratory failure: Successfully extubated 3/29 Aspiration pneumonia: Right sided Tobacco use disorder - 1ppd smoker Possible PE, McConell's sign noted on 3/27 echo - Supplemental O2 to maintain SpO2 90-95% - At risk for re-intubation - Cefepime, flagyl  - Back to NPO status, then swallowing precautions, SLP following - Will place order for [employer] for nutrition  - Scheduled levalbuterol - CTA chest  Possible COPD: undiagnosed, but she is a long time smoker, and current smoker, and has wheeze on exam.  - Broaden inhaled bronchodilators  - Systemic steroids - Will need PFT if she is able to recover.   Pulmonary embolism: R lower lobe segment. I doubt this is a significant contributor to her symptoms. Lower extremity dopplers negative.  - Supplemental O2 as above.  - Heparin infusion for now.  - No role for thrombolytic therapy of any kind.   Acute metabolic encephalopathy: Suspect at this point it is secondary to hypoxia, however, she does have a history of opioid, benzodiazepine, and alcohol use and it is possible she may be suffering from  withdrawal. Was initially on Precedex in ICU, but weaned of 3/29 and was moved out of ICU 3/30.  CT head 3/31 negative - Precedex for agitation to keep RASS 0 to -1.  - check ammonia in AM - RN neurochecks  Elevated troponin - suspect demand ischemia. EKG with no clear ischemic change.  - Troponin peaked and began to trend down 3/31 PM  Hypernatremia - Place small bore feeding tube for  nutrition and free water - Will give some dextrose in the mean time.   History hypertension - Restart home losartan when stable to do so  Chronic emesis, gastropathy, abdominal discomfort GERD - On chronic pantoprazole 40 mg twice daily   Best practice:  Diet: Clear liquids with thickener per SLP, but now back to NPO. Will place cortrak for TF Pain/Anxiety/Delirium protocol (if indicated): Precedex infusion VAP protocol (if indicated): NA DVT prophylaxis: Lovenox GI prophylaxis: Pantoprazole Glucose control: N/A Mobility: bed rest Code Status: Full Family Communication: Updated patient and husband at bedside 4/1 Disposition: To ICU  Labs   CBC: Recent Labs  Lab 02/12/20 0529 02/12/20 0529 02/13/20 0225 02/14/20 0303 02/15/20 0239 02/15/20 1035 02/16/20 0350  WBC 2.8*   < > 10.0 12.0* 17.4* 20.7* 16.0*  NEUTROABS 2.5  --  8.9* 10.7* 15.2*  --  13.4*  HGB 8.3*   < > 8.0* 7.6* 8.5* 9.0* 8.6*  HCT 24.6*   < > 22.6* 22.4* 25.3* 27.4* 26.7*  MCV 117.7*   < > 114.1* 116.7* 118.2* 123.4* 123.6*  PLT 156   < > 148* 158 189 208 218   < > = values in this interval not displayed.    Basic Metabolic Panel: Recent Labs  Lab 02/12/20 0008 02/12/20 0036 02/12/20 1157 02/13/20 0225 02/14/20 0303 02/15/20 0239 02/15/20 1035 02/16/20 0350  NA 129*   < > 130* 136 143 144  --  151*  K 3.9   < > 3.9 3.5 3.1* 3.0*  --  4.7  CL 89*   < > 94* 100 107 108  --  111  CO2 27   < > 24 22 29 26   --  28  GLUCOSE 95   < > 91 105* 133* 137*  --  126*  BUN 47*   < > 48* 38* 28* 18  --  24*  CREATININE 1.39*   < > 1.32* 0.91 0.57 0.52  --  0.51  CALCIUM 8.3*   < > 8.1* 8.3* 8.6* 8.8*  --  8.8*  MG 2.0  --  2.1  --   --  1.9  --   --   PHOS 4.8*  --  5.1*  --   --   --  2.2*  --    < > = values in this interval not displayed.   GFR: Estimated Creatinine Clearance: 54.7 mL/min (by C-G formula based on SCr of 0.51 mg/dL). Recent Labs  Lab 02/12/20 0008 02/12/20 0529 02/12/20 0715  02/13/20 0225 02/14/20 0303 02/15/20 0239 02/15/20 1035 02/16/20 0350  PROCALCITON 20.86  --   --   --   --   --   --   --   WBC  --    < >  --    < > 12.0* 17.4* 20.7* 16.0*  LATICACIDVEN 2.3*  --  1.7  --   --   --   --   --    < > = values in this interval not displayed.    Liver  Function Tests: Recent Labs  Lab 02/12/20 0008 02/12/20 0529 02/13/20 0225 02/14/20 0303 02/16/20 0350  AST 61* 70* 68* 99* 40  ALT 22 23 27  46* 51*  ALKPHOS 18* 23* 45 92 94  BILITOT 1.1 1.2 1.6* 0.9 1.0  PROT 5.3* 5.1* 4.9* 5.1* 5.5*  ALBUMIN 2.4* 2.3* 2.0* 2.1* 2.4*   No results for input(s): LIPASE, AMYLASE in the last 168 hours. No results for input(s): AMMONIA in the last 168 hours.  ABG    Component Value Date/Time   PHART 7.314 (L) 02/15/2020 1130   PCO2ART 56.2 (H) 02/15/2020 1130   PO2ART 99.2 02/15/2020 1130   HCO3 27.7 02/15/2020 1130   TCO2 26 02/12/2020 0511   ACIDBASEDEF 4.0 (H) 02/12/2020 0511   O2SAT 96.9 02/15/2020 1130     Coagulation Profile: Recent Labs  Lab 02/12/20 0008  INR 1.1    Cardiac Enzymes: Recent Labs  Lab 02/12/20 0008 02/12/20 0529  CKTOTAL 1,746* 1,775*    HbA1C: No results found for: HGBA1C  CBG: Recent Labs  Lab 02/15/20 1516 02/15/20 2000 02/15/20 2322 02/16/20 0316 02/16/20 0751  GLUCAP 100* 110* 109* 91 102*    Critical care time: N/a    04/17/20, AGACNP-BC [facility]  See Amion for personal pager PCCM on call pager [phone]  02/16/2020 10:28 AM

## 2020-02-16 NOTE — Progress Notes (Signed)
ANTICOAGULATION CONSULT NOTE - Follow-up Consult  Pharmacy Consult for Heparin Indication: pulmonary embolus  Allergies  Allergen Reactions  . Celebrex [Celecoxib] Swelling    Throat swelling  . Sulfa Antibiotics Swelling    Throat swelling    Patient Measurements: Height: 5\' 2"  (157.5 cm) Weight: 57.2 kg (126 lb) IBW/kg (Calculated) : 50.1 Heparin Dosing Weight: 57.3 kg  Vital Signs: Temp: 98.7 F (37.1 C) (04/01 1523) Temp Source: Oral (04/01 1523) BP: 110/72 (04/01 1500) Pulse Rate: 99 (04/01 1500)  Labs: Recent Labs    02/14/20 0303 02/14/20 0303 02/15/20 0239 02/15/20 0239 02/15/20 1035 02/15/20 1409 02/15/20 1801 02/15/20 2328 02/16/20 0350 02/16/20 1529  HGB 7.6*   < > 8.5*   < > 9.0*  --   --   --  8.6*  --   HCT 22.4*   < > 25.3*  --  27.4*  --   --   --  26.7*  --   PLT 158   < > 189  --  208  --   --   --  218  --   HEPARINUNFRC  --   --   --   --   --   --   --  0.39 0.25* 0.33  CREATININE 0.57  --  0.52  --   --   --   --   --  0.51  --   TROPONINIHS  --   --   --   --   --  1,369* 1,211*  --   --   --    < > = values in this interval not displayed.    Estimated Creatinine Clearance: 54.7 mL/min (by C-G formula based on SCr of 0.51 mg/dL).   Medical History: Past Medical History:  Diagnosis Date  . AAA (abdominal aortic aneurysm) (HCC)   . Anxiety   . Arthritis   . Back pain   . Cervical disc herniation   . Depression   . 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 Hypercholesteremia   . Hypertension   . Panic disorder   . Stenosis, spinal, lumbar     Assessment: 69 yr old female with medical history significant for GERD, wt loss, esophagitis, AAA, HTN, HLD was admitted on 02/12/20 for sepsis due to aspiration PNA. Pt had recent EGD/colonoscopy and was found to have gastritis, with recent episode of hematemesis. Chest CTA showed acute PE within segmental branch of RLL pulmonary artery. Pharmacy is consulted to dose heparin for new PE. Pt was on no  anticoagulation PTA.  Currently on IV heparin at 1050 units/hr. HL this afternoon is low therapeutic at 0.33. RN reports no s/s of bleeding    Goal of Therapy:  Heparin level 0.3-0.7 units/ml Monitor platelets by anticoagulation protocol: Yes   Plan:   Increase heparin gtt slightly to 1100 units/hr Check 6-hr confirmatory heparin level Monitor daily heparin level, CBC Monitor for signs/symptoms of bleeding (recent episode of hematemesis)  02/14/20, PharmD., BCPS Clinical Pharmacist Clinical phone for 02/16/20 until 10pm6/1/21 If after 10pm, please refer to [facility] for unit-specific pharmacist

## 2020-02-16 NOTE — Progress Notes (Signed)
Patient requesting to remove BiPAP at this time. Patient placed on 8 Lpm HFNC. Vitals stable. Will continue to monitor.

## 2020-02-16 NOTE — Progress Notes (Signed)
Patient with increased WOB with RR in the mid to upper 30's. Breathing treatment given. Informed patient that BiPAP was at bedside if needed for breathing and patient asked to be put on it. Placed patient on BiPAP at this time. RN aware. Per MD note, BiPAP as needed, however, order not in place. Consulted with NP outside of room. BiPAP order being placed.

## 2020-02-16 NOTE — Progress Notes (Signed)
PROGRESS NOTE  Dawn Beasley ESP:[id number] DOB: 1951-02-19 DOA: 02/11/2020 PCP: Lonie Peak, PA-C  HPI/Recap of past 24 hours: HPI from PCCM 69 year old female with medical history significant for hypertension, chronic nausea/vomiting, tobacco abuse, alcohol abuse, spinal stenosis, aortic aneurysm, presents to the ED due to encephalopathy, near syncope and an episode of hematemesis. In the ED, patient was confused, hypoxic and was placed on 4 L nasal cannula. Chest x-ray showed diffuse right-sided interstitial opacities and dense consolidation in the right lower lobe concerning for aspiration pneumonia. CT head unremarkable. Patient continued to be progressively hypoxic and encephalopathic requiring intubation on 3/27. She also became hypotensive and required peripheral Levophed. PCCM was consulted for admission. Patient was subsequently extubated on 3/29, and weaned down to 4 L of oxygen. [facility] was consulted to assume care on 02/15/20.    Today, patient noted to be lethargic, still in respiratory distress.  Currently on Precedex in the ICU, unable to obtain ROS.  Son at bedside.    Assessment/Plan: Active Problems:   Essential hypertension   Pancytopenia (HCC)   Sepsis (HCC)   Aspiration pneumonia (HCC)   Acute on chronic respiratory failure with hypoxia (HCC)   Hyponatremia   AKI (acute kidney injury) (HCC)   Dehydration   Rhabdomyolysis   Elevated troponin   Respiratory failure (HCC)  Acute hypoxemic respiratory failure Multifactorial 2/2 multifocal pneumonia, acute PE in the setting of possible COPD exacerbation Status post mechanical ventilation on 3/27, extubated on 3/29 Currently requiring non-rebreather, with noted respiratory distress Afebrile, with leukocytosis (on steroids) ABG showed pH of 7.314, PCO2 66.2, PO2 99.2, bicarb 27.7 Chest x-ray showed bilateral pulmonary infiltrates, worse in right upper lobe, consistent with multifocal pneumonia CTA chest  showed acute PE within segmental branch of the right lower lobe pulmonary artery, multifocal pneumonia Bilateral lower extremity Doppler negative for DVT S/P Unasyn, continue cefepime, Flagyl, MRSA nares negative Continue IV Heparin Duo nebs, IV Solu-Medrol, supplemental oxygen/BiPAP as needed PCCM consulted, transfer back to the ICU, following  Septic shock BP currently soft ?? Likely due to above Currently weaned off of pressors Further management per PCCM  Acute metabolic encephalopathy Likely due to above, in addition to possible alcohol/benzodiazepine withdrawal CT head with no acute intracranial abnormality SLP on board, recommend thick nectar liquids, will hold off for now due to encephalopathy, continue tube feedings/SSI/CBGs Further management as above, currently on Precedex drip as per PCCM  Elevated troponin Likely 2/2 demand ischemia from possible PE Echo showed RV free wall hypokinesis, questionable McConnell sign Monitor closely  Hypernatremia Likely 2/2 poor oral intake Started on D5 water, tube feeds Daily CMP  Chronic nausea/vomiting/gastropathy/GERD Outpatient EGD showed esophagitis, colonoscopy fairly unremarkable Continue pantoprazole 40 mg twice daily GI follow-up as an outpatient  Macrocytic anemia Unsure of her baseline, as last labs prior to admission were back in 2018 ??  Possibly due to alcohol abuse MCV noted to be around 112 on admission Anemia panel pending Daily CBC  History of hypertension Hold Home losartan due to soft BP  History of hyperlipidemia Home pravastatin  History of tobacco/alcohol abuse Discussed with husband, patient drinks about a bottle of wine daily, smokes about a pack of cigarettes daily Nicotine patch ordered May need Precedex now in the ICU         Malnutrition Type:  Nutrition Problem: Inadequate oral intake Etiology: poor appetite, nausea, acute illness   Malnutrition  Characteristics:  Signs/Symptoms: NPO status   Nutrition Interventions:  Interventions: Refer to RD note  for recommendations    Estimated body mass index is 23.05 kg/m as calculated from the following:   Height as of this encounter: 5\' 2"  (1.575 m).   Weight as of this encounter: 57.2 kg.     Code Status: Full  Family Communication: Discussed extensively with son at bedside on 02/16/20  Disposition Plan: Patient came from home, currently transferred to the ICU. Further disposition plan will be based on ICU clinical course   Consultants:  PCCM  Procedures:  None  Antimicrobials:  Cefepime  Metronidazole  DVT prophylaxis: Lovenox   Objective: Vitals:   02/16/20 1500 02/16/20 1523 02/16/20 1600 02/16/20 1630  BP: 110/72  115/65   Pulse: 99   99  Resp: (!) 21  (!) 24 (!) 30  Temp:  98.7 F (37.1 C)    TempSrc:  Oral    SpO2: 92%  100% 100%  Weight:      Height:        Intake/Output Summary (Last 24 hours) at 02/16/2020 1723 Last data filed at 02/16/2020 1600 Gross per 24 hour  Intake 2900.87 ml  Output 500 ml  Net 2400.87 ml   Filed Weights   02/15/20 0500 02/16/20 0336 02/16/20 0800  Weight: 57.3 kg 57.2 kg 57.2 kg    Exam:  General:  Lethargic, significant respiratory distress  Cardiovascular: S1, S2 present  Respiratory:  Diminished breath sounds bilaterally, with significant wheezing noted  Abdomen: Soft, nontender, nondistended, bowel sounds present  Musculoskeletal: No bilateral pedal edema noted, cool extremities  Skin:  Mottled extremities  Psychiatry: Unable to assess   Data Reviewed: CBC: Recent Labs  Lab 02/12/20 0529 02/12/20 0529 02/13/20 0225 02/14/20 0303 02/15/20 0239 02/15/20 1035 02/16/20 0350  WBC 2.8*   < > 10.0 12.0* 17.4* 20.7* 16.0*  NEUTROABS 2.5  --  8.9* 10.7* 15.2*  --  13.4*  HGB 8.3*   < > 8.0* 7.6* 8.5* 9.0* 8.6*  HCT 24.6*   < > 22.6* 22.4* 25.3* 27.4* 26.7*  MCV 117.7*   < > 114.1* 116.7* 118.2*  123.4* 123.6*  PLT 156   < > 148* 158 189 208 218   < > = values in this interval not displayed.   Basic Metabolic Panel: Recent Labs  Lab 02/12/20 0008 02/12/20 0036 02/12/20 [id number] 02/13/20 0225 02/14/20 0303 02/15/20 0239 02/15/20 1035 02/16/20 0350 02/16/20 1136 02/16/20 1529  NA 129*   < > 130* 136 143 144  --  151*  --   --   K 3.9   < > 3.9 3.5 3.1* 3.0*  --  4.7  --   --   CL 89*   < > 94* 100 107 108  --  111  --   --   CO2 27   < > 24 22 29 26   --  28  --   --   GLUCOSE 95   < > 91 105* 133* 137*  --  126*  --   --   BUN 47*   < > 48* 38* 28* 18  --  24*  --   --   CREATININE 1.39*   < > 1.32* 0.91 0.57 0.52  --  0.51  --   --   CALCIUM 8.3*   < > 8.1* 8.3* 8.6* 8.8*  --  8.8*  --   --   MG 2.0  --  2.1  --   --  1.9  --   --  2.0 2.0  PHOS  4.8*  --  5.1*  --   --   --  2.2*  --  3.0 2.9   < > = values in this interval not displayed.   GFR: Estimated Creatinine Clearance: 54.7 mL/min (by C-G formula based on SCr of 0.51 mg/dL). Liver Function Tests: Recent Labs  Lab 02/12/20 0008 02/12/20 0529 02/13/20 0225 02/14/20 0303 02/16/20 0350  AST 61* 70* 68* 99* 40  ALT 46* 51*  ALKPHOS 18* 23* 45 92 94  BILITOT 1.1 1.2 1.6* 0.9 1.0  PROT 5.3* 5.1* 4.9* 5.1* 5.5*  ALBUMIN 2.4* 2.3* 2.0* 2.1* 2.4*   No results for input(s): LIPASE, AMYLASE in the last 168 hours. No results for input(s): AMMONIA in the last 168 hours. Coagulation Profile: Recent Labs  Lab 02/12/20 0008  INR 1.1   Cardiac Enzymes: Recent Labs  Lab 02/12/20 0008 02/12/20 0529  CKTOTAL 1,746* 1,775*   BNP (last 3 results) No results for input(s): PROBNP in the last 8760 hours. HbA1C: No results for input(s): HGBA1C in the last 72 hours. CBG: Recent Labs  Lab 02/15/20 2322 02/16/20 0316 02/16/20 0751 02/16/20 1134 02/16/20 1522  GLUCAP 109* 91 102* 141* 146*   Lipid Profile: No results for input(s): CHOL, HDL, LDLCALC, TRIG, CHOLHDL, LDLDIRECT in the last 72  hours. Thyroid Function Tests: No results for input(s): TSH, T4TOTAL, FREET4, T3FREE, THYROIDAB in the last 72 hours. Anemia Panel: No results for input(s): VITAMINB12, FOLATE, FERRITIN, TIBC, IRON, RETICCTPCT in the last 72 hours. Urine analysis:    Component Value Date/Time   COLORURINE AMBER (A) 02/12/2020 0127   APPEARANCEUR HAZY (A) 02/12/2020 0127   LABSPEC 1.017 02/12/2020 0127   PHURINE 5.0 02/12/2020 0127   GLUCOSEU NEGATIVE 02/12/2020 0127   HGBUR LARGE (A) 02/12/2020 0127   BILIRUBINUR NEGATIVE 02/12/2020 0127   KETONESUR NEGATIVE 02/12/2020 0127   PROTEINUR 100 (A) 02/12/2020 0127   UROBILINOGEN 0.2 08/02/2007 1052   NITRITE NEGATIVE 02/12/2020 0127   LEUKOCYTESUR NEGATIVE 02/12/2020 0127   Sepsis Labs: (procalcitonin:4,lacticidven:4)  ) Recent Results (from the past 240 hour(s))  SARS CORONAVIRUS 2 (TAT 6-24 HRS) Nasopharyngeal Nasopharyngeal Swab     Status: None   Collection Time: 02/11/20 10:08 PM   Specimen: Nasopharyngeal Swab  Result Value Ref Range Status   SARS Coronavirus 2 NEGATIVE NEGATIVE Final    Comment: (NOTE) SARS-CoV-2 target nucleic acids are NOT DETECTED. The SARS-CoV-2 RNA is generally detectable in upper and lower respiratory specimens during the acute phase of infection. Negative results do not preclude SARS-CoV-2 infection, do not rule out co-infections with other pathogens, and should not be used as the sole basis for treatment or other patient management decisions. Negative results must be combined with clinical observations, patient history, and epidemiological information. The expected result is Negative. Fact Sheet for Patients: HairSlick.no Fact Sheet for Healthcare Providers: quierodirigir.com This test is not yet approved or cleared by the [place] FDA and  has been authorized for detection and/or diagnosis of SARS-CoV-2 by FDA under an Emergency Use  Authorization (EUA). This EUA will remain  in effect (meaning this test can be used) for the duration of the COVID-19 declaration under Section 56 4(b)(1) of the Act, 21 U.S.C. section 360bbb-3(b)(1), unless the authorization is terminated or revoked sooner. Performed at [facility], [address], [place], [place] [address]   Culture, blood (x 2)     Status: None (Preliminary result)   Collection Time: 02/12/20 12:18 AM   Specimen: BLOOD  Result Value Ref Range Status   Specimen Description BLOOD LEFT FOREARM  Final   Special Requests   Final    BOTTLES DRAWN AEROBIC ONLY Blood Culture adequate volume   Culture   Final    NO GROWTH 4 DAYS Performed at [facility], [address], [place], [place] [address]    Report Status PENDING  Incomplete  Culture, blood (x 2)     Status: None (Preliminary result)   Collection Time: 02/12/20 12:18 AM   Specimen: BLOOD  Result Value Ref Range Status   Specimen Description BLOOD RIGHT FOREARM  Final   Special Requests   Final    BOTTLES DRAWN AEROBIC AND ANAEROBIC Blood Culture adequate volume   Culture   Final    NO GROWTH 4 DAYS Performed at [facility], [place] [address] [place], [place] [address]    Report Status PENDING  Incomplete  Respiratory Panel by RT PCR (Flu A&B, Covid) - Nasopharyngeal Swab     Status: None   Collection Time: 02/12/20  1:32 AM   Specimen: Nasopharyngeal Swab  Result Value Ref Range Status   SARS Coronavirus 2 by RT PCR NEGATIVE NEGATIVE Final    Comment: (NOTE) SARS-CoV-2 target nucleic acids are NOT DETECTED. The SARS-CoV-2 RNA is generally detectable in upper respiratoy specimens during the acute phase of infection. The lowest concentration of SARS-CoV-2 viral copies this assay can detect is 131 copies/mL. A negative result does not preclude SARS-Cov-2 infection and should not be used as the sole basis for treatment or other patient management decisions. A negative result may occur  with  improper specimen collection/handling, submission of specimen other than nasopharyngeal swab, presence of viral mutation(s) within the areas targeted by this assay, and inadequate number of viral copies (<131 copies/mL). A negative result must be combined with clinical observations, patient history, and epidemiological information. The expected result is Negative. Fact Sheet for Patients:  PinkCheek.be Fact Sheet for Healthcare Providers:  GravelBags.it This test is not yet ap proved or cleared by the [place] FDA and  has been authorized for detection and/or diagnosis of SARS-CoV-2 by FDA under an Emergency Use Authorization (EUA). This EUA will remain  in effect (meaning this test can be used) for the duration of the COVID-19 declaration under Section 564(b)(1) of the Act, 21 U.S.C. section 360bbb-3(b)(1), unless the authorization is terminated or revoked sooner.    Influenza A by PCR NEGATIVE NEGATIVE Final   Influenza B by PCR NEGATIVE NEGATIVE Final    Comment: (NOTE) The Xpert Xpress SARS-CoV-2/FLU/RSV assay is intended as an aid in  the diagnosis of influenza from Nasopharyngeal swab specimens and  should not be used as a sole basis for treatment. Nasal washings and  aspirates are unacceptable for Xpert Xpress SARS-CoV-2/FLU/RSV  testing. Fact Sheet for Patients: PinkCheek.be Fact Sheet for Healthcare Providers: GravelBags.it This test is not yet approved or cleared by the [place] FDA and  has been authorized for detection and/or diagnosis of SARS-CoV-2 by  FDA under an Emergency Use Authorization (EUA). This EUA will remain  in effect (meaning this test can be used) for the duration of the  Covid-19 declaration under Section 564(b)(1) of the Act, 21  U.S.C. section 360bbb-3(b)(1), unless the authorization is  terminated or revoked. Performed  at [facility], [place] [address] [place], [place] [address]   Respiratory Panel by PCR     Status: None   Collection Time: 02/12/20  1:32 AM  Specimen: Nasopharyngeal Swab; Respiratory  Result Value Ref Range Status   Adenovirus NOT DETECTED NOT DETECTED Final   Coronavirus 229E NOT DETECTED NOT DETECTED Final    Comment: (NOTE) The Coronavirus on the Respiratory Panel, DOES NOT test for the novel  Coronavirus (2019 nCoV)    Coronavirus HKU1 NOT DETECTED NOT DETECTED Final   Coronavirus NL63 NOT DETECTED NOT DETECTED Final   Coronavirus OC43 NOT DETECTED NOT DETECTED Final   Metapneumovirus NOT DETECTED NOT DETECTED Final   Rhinovirus / Enterovirus NOT DETECTED NOT DETECTED Final   Influenza A NOT DETECTED NOT DETECTED Final   Influenza B NOT DETECTED NOT DETECTED Final   Parainfluenza Virus 1 NOT DETECTED NOT DETECTED Final   Parainfluenza Virus 2 NOT DETECTED NOT DETECTED Final   Parainfluenza Virus 3 NOT DETECTED NOT DETECTED Final   Parainfluenza Virus 4 NOT DETECTED NOT DETECTED Final   Respiratory Syncytial Virus NOT DETECTED NOT DETECTED Final   Bordetella pertussis NOT DETECTED NOT DETECTED Final   Chlamydophila pneumoniae NOT DETECTED NOT DETECTED Final   Mycoplasma pneumoniae NOT DETECTED NOT DETECTED Final    Comment: Performed at [facility], [address], [place], [place] [address]  MRSA PCR Screening     Status: None   Collection Time: 02/12/20  6:46 AM   Specimen: Nasal Mucosa; Nasopharyngeal  Result Value Ref Range Status   MRSA by PCR NEGATIVE NEGATIVE Final    Comment:        The GeneXpert MRSA Assay (FDA approved for NASAL specimens only), is one component of a comprehensive MRSA colonization surveillance program. It is not intended to diagnose MRSA infection nor to guide or monitor treatment for MRSA infections. Performed at [facility], [address] [place], [place] [address]       Studies: VAS [place] LOWER EXTREMITY VENOUS  (DVT)  Result Date: 02/15/2020  Lower Venous DVTStudy Indications: Pulmonary embolism.  Risk Factors: Confirmed PE. Limitations: Poor patient cooperation. and poor ultrasound/tissue interface. Comparison Study: No prior exam. Performing Technologist: Kennedy Bucker ARDMS, RVT  Examination Guidelines: A complete evaluation includes B-mode imaging, spectral Doppler, color Doppler, and power Doppler as needed of all accessible portions of each vessel. Bilateral testing is considered an integral part of a complete examination. Limited examinations for reoccurring indications may be performed as noted. The reflux portion of the exam is performed with the patient in reverse Trendelenburg.  +---------+---------------+---------+-----------+----------+--------------+ RIGHT    CompressibilityPhasicitySpontaneityPropertiesThrombus Aging +---------+---------------+---------+-----------+----------+--------------+ CFV      Full           Yes      Yes                                 +---------+---------------+---------+-----------+----------+--------------+ SFJ      Full                                                        +---------+---------------+---------+-----------+----------+--------------+ FV Prox  Full                                                        +---------+---------------+---------+-----------+----------+--------------+  FV Mid   Full                                                        +---------+---------------+---------+-----------+----------+--------------+ FV DistalFull                                                        +---------+---------------+---------+-----------+----------+--------------+ PFV      Full                                                        +---------+---------------+---------+-----------+----------+--------------+ POP      Full           Yes      Yes                                  +---------+---------------+---------+-----------+----------+--------------+ PTV      Full                                                        +---------+---------------+---------+-----------+----------+--------------+ PERO     Full                                                        +---------+---------------+---------+-----------+----------+--------------+   +---------+---------------+---------+-----------+----------+--------------+ LEFT     CompressibilityPhasicitySpontaneityPropertiesThrombus Aging +---------+---------------+---------+-----------+----------+--------------+ CFV      Full           Yes      Yes                                 +---------+---------------+---------+-----------+----------+--------------+ SFJ      Full                                                        +---------+---------------+---------+-----------+----------+--------------+ FV Prox  Full                                                        +---------+---------------+---------+-----------+----------+--------------+ FV Mid   Full                                                        +---------+---------------+---------+-----------+----------+--------------+  FV DistalFull                                                        +---------+---------------+---------+-----------+----------+--------------+ PFV      Full                                                        +---------+---------------+---------+-----------+----------+--------------+ POP      Full           Yes      Yes                                 +---------+---------------+---------+-----------+----------+--------------+ PTV      Full                                                        +---------+---------------+---------+-----------+----------+--------------+ PERO     Full                                                         +---------+---------------+---------+-----------+----------+--------------+     Summary: BILATERAL: - No evidence of deep vein thrombosis seen in the lower extremities, bilaterally.  RIGHT: - No cystic structure found in the popliteal fossa.  LEFT: - No cystic structure found in the popliteal fossa.  *See table(s) above for measurements and observations. Electronically signed by Gretta Began MD on 02/15/2020 at 6:37:14 PM.    Final     Scheduled Meds: . Chlorhexidine Gluconate Cloth  6 each Topical Daily  . feeding supplement (PRO-STAT SUGAR FREE 64)  30 mL Per Tube BID  . feeding supplement (VITAL HIGH PROTEIN)  1,000 mL Per Tube Q24H  . folic acid  1 mg Oral Daily  . insulin aspart  0-9 Units Subcutaneous Q4H  . ipratropium  0.5 mg Nebulization Q6H  . levalbuterol  0.63 mg Nebulization Q6H  . mouth rinse  15 mL Mouth Rinse BID  . methylPREDNISolone (SOLU-MEDROL) injection  60 mg Intravenous Q12H  . midazolam  2 mg Intravenous Once  . multivitamin with minerals  1 tablet Oral Daily  . nicotine  21 mg Transdermal Daily  . pantoprazole  40 mg Oral BID  . thiamine  100 mg Oral Daily   Or  . thiamine  100 mg Intravenous Daily    Continuous Infusions: . sodium chloride Stopped (02/16/20 1146)  . ceFEPime (MAXIPIME) IV Stopped (02/16/20 1025)  . dexmedetomidine (PRECEDEX) IV infusion 1 mcg/kg/hr (02/16/20 1600)  . dextrose 50 mL/hr at 02/16/20 1600  . heparin 1,100 Units/hr (02/16/20 1630)  . metronidazole Stopped (02/16/20 1239)     LOS: 4 days     Briant Cedar, MD [facility]  If 7PM-7AM, please contact night-coverage [email or web address] 02/16/2020, 5:23 PM

## 2020-02-16 NOTE — Progress Notes (Signed)
SLP Cancellation Note  Patient Details Name: Dawn Beasley MRN: [id number] DOB: 1951/05/30   Cancelled treatment:       Reason Eval/Treat Not Completed: Medical issues which prohibited therapy. Events of yesterday noted; pt transferred to ICU with respiratory distress. Plan is to place cortrak. SLP will follow for improvements.  Allia Wiltsey L. Samson Frederic, MA CCC/SLP Acute Rehabilitation Services Office number [phone] Pager [phone]    Carolan Shiver 02/16/2020, 4:36 PM

## 2020-02-16 NOTE — Progress Notes (Signed)
ANTICOAGULATION CONSULT NOTE - Follow-up Consult  Pharmacy Consult for Heparin Indication: pulmonary embolus  Allergies  Allergen Reactions  . Celebrex [Celecoxib] Swelling    Throat swelling  . Sulfa Antibiotics Swelling    Throat swelling    Patient Measurements: Height: 5\' 2"  (157.5 cm) Weight: 57.2 kg (126 lb) IBW/kg (Calculated) : 50.1 Heparin Dosing Weight: 57.3 kg  Vital Signs: Temp: 97.7 F (36.5 C) (04/01 0752) Temp Source: Axillary (04/01 0752) BP: 116/77 (04/01 0600) Pulse Rate: 92 (04/01 0500)  Labs: Recent Labs    02/14/20 0303 02/14/20 0303 02/15/20 0239 02/15/20 0239 02/15/20 1035 02/15/20 1409 02/15/20 1801 02/15/20 2328 02/16/20 0350  HGB 7.6*   < > 8.5*   < > 9.0*  --   --   --  8.6*  HCT 22.4*   < > 25.3*  --  27.4*  --   --   --  26.7*  PLT 158   < > 189  --  208  --   --   --  218  HEPARINUNFRC  --   --   --   --   --   --   --  0.39 0.25*  CREATININE 0.57  --  0.52  --   --   --   --   --  0.51  TROPONINIHS  --   --   --   --   --  1,369* 1,211*  --   --    < > = values in this interval not displayed.    Estimated Creatinine Clearance: 54.7 mL/min (by C-G formula based on SCr of 0.51 mg/dL).   Medical History: Past Medical History:  Diagnosis Date  . AAA (abdominal aortic aneurysm) (HCC)   . Anxiety   . Arthritis   . Back pain   . Cervical disc herniation   . Depression   . 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 Hypercholesteremia   . Hypertension   . Panic disorder   . Stenosis, spinal, lumbar     Assessment: 69 yr old female with medical history significant for GERD, wt loss, esophagitis, AAA, HTN, HLD was admitted on 02/12/20 for sepsis due to aspiration PNA. Pt had recent EGD/colonoscopy and was found to have gastritis, with recent episode of hematemesis. Chest CTA showed acute PE within segmental branch of RLL pulmonary artery. Pharmacy is consulted to dose heparin for new PE. Pt was on no anticoagulation PTA.  Of note, pt has  been receiving Lovenox 30 mg SQ day for VTE prophylaxis (last dose at 0901 AM on 3/31).   HL 0.25 - subtherapeutic; no reports of bleeding per nurse. H/H 8.6/26.7, platelets 218 (CBC ~stable).    Goal of Therapy:  Heparin level 0.3-0.7 units/ml Monitor platelets by anticoagulation protocol: Yes   Plan:   Increase heparin gtt to 1050 units/hr Check 6-hr heparin level Monitor daily heparin level, CBC Monitor for signs/symptoms of bleeding (recent episode of hematemesis)  02-13-2001, PharmD PGY1 Ambulatory Care Resident [employer] # [phone]

## 2020-02-17 ENCOUNTER — Inpatient Hospital Stay (HOSPITAL_COMMUNITY): Payer: PPO

## 2020-02-17 DIAGNOSIS — I2694 Multiple subsegmental pulmonary emboli without acute cor pulmonale: Secondary | ICD-10-CM | POA: Diagnosis present

## 2020-02-17 DIAGNOSIS — R410 Disorientation, unspecified: Secondary | ICD-10-CM

## 2020-02-17 LAB — COMPREHENSIVE METABOLIC PANEL
ALT: 44 U/L (ref 0–44)
AST: 30 U/L (ref 15–41)
Albumin: 2.5 g/dL — ABNORMAL LOW (ref 3.5–5.0)
Alkaline Phosphatase: 73 U/L (ref 38–126)
Anion gap: 14 (ref 5–15)
BUN: 30 mg/dL — ABNORMAL HIGH (ref 8–23)
CO2: 23 mmol/L (ref 22–32)
Calcium: 8.4 mg/dL — ABNORMAL LOW (ref 8.9–10.3)
Chloride: 112 mmol/L — ABNORMAL HIGH (ref 98–111)
Creatinine, Ser: 0.64 mg/dL (ref 0.44–1.00)
GFR calc Af Amer: >60 mL/min (ref >60)
GFR calc non Af Amer: >60 mL/min (ref >60)
Glucose, Bld: 147 mg/dL — ABNORMAL HIGH (ref 70–99)
Potassium: 4.1 mmol/L (ref 3.5–5.1)
Sodium: 149 mmol/L — ABNORMAL HIGH (ref 135–145)
Total Bilirubin: 0.7 mg/dL (ref 0.3–1.2)
Total Protein: 5.3 g/dL — ABNORMAL LOW (ref 6.5–8.1)

## 2020-02-17 LAB — POCT I-STAT 7, (LYTES, BLD GAS, ICA,H+H)
Acid-Base Excess: 2 mmol/L (ref 0.0–2.0)
Bicarbonate: 27.1 mmol/L (ref 20.0–28.0)
Calcium, Ion: 1.25 mmol/L (ref 1.15–1.40)
HCT: 24 % — ABNORMAL LOW (ref 36.0–46.0)
Hemoglobin: 8.2 g/dL — ABNORMAL LOW (ref 12.0–15.0)
O2 Saturation: 93 %
Patient temperature: 98.7
Potassium: 3.7 mmol/L (ref 3.5–5.1)
Sodium: 148 mmol/L — ABNORMAL HIGH (ref 135–145)
TCO2: 28 mmol/L (ref 22–32)
pCO2 arterial: 41.9 mmHg (ref 32.0–48.0)
pH, Arterial: 7.419 (ref 7.350–7.450)
pO2, Arterial: 67 mmHg — ABNORMAL LOW (ref 83.0–108.0)

## 2020-02-17 LAB — MAGNESIUM
Magnesium: 2.2 mg/dL (ref 1.7–2.4)
Magnesium: 2.1 mg/dL (ref 1.7–2.4)

## 2020-02-17 LAB — GLUCOSE, CAPILLARY
Glucose-Capillary: 126 mg/dL — ABNORMAL HIGH (ref 70–99)
Glucose-Capillary: 136 mg/dL — ABNORMAL HIGH (ref 70–99)
Glucose-Capillary: 138 mg/dL — ABNORMAL HIGH (ref 70–99)
Glucose-Capillary: 138 mg/dL — ABNORMAL HIGH (ref 70–99)
Glucose-Capillary: 140 mg/dL — ABNORMAL HIGH (ref 70–99)
Glucose-Capillary: 149 mg/dL — ABNORMAL HIGH (ref 70–99)

## 2020-02-17 LAB — IRON AND TIBC
Iron: 101 ug/dL (ref 28–170)
Saturation Ratios: 57 % — ABNORMAL HIGH (ref 10.4–31.8)
TIBC: 176 ug/dL — ABNORMAL LOW (ref 250–450)
UIBC: 75 ug/dL

## 2020-02-17 LAB — CBC WITH DIFFERENTIAL/PLATELET
Abs Immature Granulocytes: 0.33 10*3/uL — ABNORMAL HIGH (ref 0.00–0.07)
Basophils Absolute: 0 10*3/uL (ref 0.0–0.1)
Basophils Relative: 0 %
Eosinophils Absolute: 0 10*3/uL (ref 0.0–0.5)
Eosinophils Relative: 0 %
HCT: 23.4 % — ABNORMAL LOW (ref 36.0–46.0)
Hemoglobin: 7.6 g/dL — ABNORMAL LOW (ref 12.0–15.0)
Immature Granulocytes: 2 %
Lymphocytes Relative: 2 %
Lymphs Abs: 0.4 10*3/uL — ABNORMAL LOW (ref 0.7–4.0)
MCH: 39.6 pg — ABNORMAL HIGH (ref 26.0–34.0)
MCHC: 32.5 g/dL (ref 30.0–36.0)
MCV: 121.9 fL — ABNORMAL HIGH (ref 80.0–100.0)
Monocytes Absolute: 0.4 10*3/uL (ref 0.1–1.0)
Monocytes Relative: 2 %
Neutro Abs: 17.4 10*3/uL — ABNORMAL HIGH (ref 1.7–7.7)
Neutrophils Relative %: 94 %
Platelets: 261 10*3/uL (ref 150–400)
RBC: 1.92 MIL/uL — ABNORMAL LOW (ref 3.87–5.11)
RDW: 14.7 % (ref 11.5–15.5)
WBC: 18.6 10*3/uL — ABNORMAL HIGH (ref 4.0–10.5)
nRBC: 0.4 % — ABNORMAL HIGH (ref 0.0–0.2)

## 2020-02-17 LAB — CBC
HCT: 24.5 % — ABNORMAL LOW (ref 36.0–46.0)
Hemoglobin: 8 g/dL — ABNORMAL LOW (ref 12.0–15.0)
MCH: 39.6 pg — ABNORMAL HIGH (ref 26.0–34.0)
MCHC: 32.7 g/dL (ref 30.0–36.0)
MCV: 121.3 fL — ABNORMAL HIGH (ref 80.0–100.0)
Platelets: 284 10*3/uL (ref 150–400)
RBC: 2.02 MIL/uL — ABNORMAL LOW (ref 3.87–5.11)
RDW: 14.7 % (ref 11.5–15.5)
WBC: 22.4 10*3/uL — ABNORMAL HIGH (ref 4.0–10.5)
nRBC: 0.5 % — ABNORMAL HIGH (ref 0.0–0.2)

## 2020-02-17 LAB — CULTURE, BLOOD (ROUTINE X 2)
Culture: NO GROWTH
Culture: NO GROWTH
Special Requests: ADEQUATE
Special Requests: ADEQUATE

## 2020-02-17 LAB — PHOSPHORUS
Phosphorus: 2.6 mg/dL (ref 2.5–4.6)
Phosphorus: 2.4 mg/dL — ABNORMAL LOW (ref 2.5–4.6)

## 2020-02-17 LAB — VITAMIN B12: Vitamin B-12: 1229 pg/mL — ABNORMAL HIGH (ref 180–914)

## 2020-02-17 LAB — FOLATE: Folate: 9.5 ng/mL (ref >5.9)

## 2020-02-17 LAB — AMMONIA: Ammonia: 43 umol/L — ABNORMAL HIGH (ref 9–35)

## 2020-02-17 LAB — HEPARIN LEVEL (UNFRACTIONATED): Heparin Unfractionated: 0.63 IU/mL (ref 0.30–0.70)

## 2020-02-17 LAB — FERRITIN: Ferritin: 894 ng/mL — ABNORMAL HIGH (ref 11–307)

## 2020-02-17 MED ORDER — FREE WATER
200.0000 mL | Status: DC
  Administered 2020-02-17 – 2020-02-18 (×6): 200 mL

## 2020-02-17 MED ORDER — IPRATROPIUM BROMIDE 0.02 % IN SOLN
0.5000 mg | Freq: Three times a day (TID) | RESPIRATORY_TRACT | Status: DC
  Administered 2020-02-18 – 2020-02-23 (×13): 0.5 mg via RESPIRATORY_TRACT
  Filled 2020-02-17 (×14): qty 2.5

## 2020-02-17 MED ORDER — ADULT MULTIVITAMIN W/MINERALS CH
1.0000 | ORAL_TABLET | Freq: Every day | ORAL | Status: DC
  Administered 2020-02-17 – 2020-03-02 (×12): 1
  Filled 2020-02-17 (×14): qty 1

## 2020-02-17 MED ORDER — METHYLPREDNISOLONE SODIUM SUCC 125 MG IJ SOLR
40.0000 mg | INTRAMUSCULAR | Status: AC
  Administered 2020-02-18 – 2020-02-19 (×2): 40 mg via INTRAVENOUS
  Filled 2020-02-17 (×2): qty 2

## 2020-02-17 MED ORDER — FOLIC ACID 1 MG PO TABS
1.0000 mg | ORAL_TABLET | Freq: Every day | ORAL | Status: DC
  Administered 2020-02-17 – 2020-03-02 (×15): 1 mg
  Filled 2020-02-17 (×15): qty 1

## 2020-02-17 MED ORDER — LEVALBUTEROL HCL 0.63 MG/3ML IN NEBU
0.6300 mg | INHALATION_SOLUTION | Freq: Three times a day (TID) | RESPIRATORY_TRACT | Status: DC
  Administered 2020-02-18 – 2020-02-23 (×13): 0.63 mg via RESPIRATORY_TRACT
  Filled 2020-02-17 (×14): qty 3

## 2020-02-17 MED ORDER — PANTOPRAZOLE SODIUM 40 MG IV SOLR
40.0000 mg | Freq: Two times a day (BID) | INTRAVENOUS | Status: DC
  Administered 2020-02-17 – 2020-02-20 (×8): 40 mg via INTRAVENOUS
  Filled 2020-02-17 (×9): qty 40

## 2020-02-17 MED ORDER — LORAZEPAM 2 MG/ML IJ SOLN
0.5000 mg | INTRAMUSCULAR | Status: AC | PRN
  Administered 2020-02-17 – 2020-02-19 (×4): 1 mg via INTRAVENOUS
  Filled 2020-02-17 (×4): qty 1

## 2020-02-17 MED ORDER — OSMOLITE 1.2 CAL PO LIQD
1000.0000 mL | ORAL | Status: DC
  Administered 2020-02-17 – 2020-02-20 (×2): 1000 mL
  Filled 2020-02-17 (×9): qty 1000

## 2020-02-17 NOTE — Progress Notes (Addendum)
Nutrition Follow-up / Consult  DOCUMENTATION CODES:   Not applicable  INTERVENTION:   Begin TF after Cortrak feeding tube placed today:   Osmolite 1.2 at 25 ml/h, increase by 10 ml every 4 hours to goal rate of 65 ml/h (1560 ml per day)   Provides 1872 kcal, 87 gm protein, 1279 ml free water daily  NUTRITION DIAGNOSIS:   Inadequate oral intake related to poor appetite, nausea, acute illness as evidenced by NPO status.  Ongoing   GOAL:   Patient will meet greater than or equal to 90% of their needs  Progressing with TF initiation today  MONITOR:   Diet advancement, PO intake, Labs, Weight trends  REASON FOR ASSESSMENT:   Consult Enteral/tube feeding initiation and management  ASSESSMENT:   69 yo female with AMS and emesis with hypoxic respiratory failure with aspiration pneumonia, AKI, in shock . Pt with recent EGD for nausea, vomiting, weight loss over the last 2 years. PMH includes HTN, depression, anxiety  Patient was intubated from 3/27-3/29. Transferred out of the ICU on 3/30, then back to the ICU on 4/1.  She was on nectar thick clear liquids from 3/30-4/1.  Patient required BiPAP last night; remains on BiPAP this morning.  Currently not appropriate for PO trials; remains NPO with no enteral access at this time. Cortrak has been ordered. Received MD Consult for TF initiation and management.  Labs reviewed. Sodium 149 (H), BUN 30 (H) CBG's: 138-140  Medications reviewed and include precedex, IV flagyl, folic acid, novolog, solumedrol, MVI, thiamine. IVF: D5 at 50 ml/h  Weight 55 kg today, 54.5 kg on admission I/O + 8.8 L since admit  NUTRITION - FOCUSED PHYSICAL EXAM:  unable to complete  Diet Order:   Diet Order            Diet NPO time specified  Diet effective now              EDUCATION NEEDS:   Not appropriate for education at this time  Skin:  Skin Assessment: Reviewed RN Assessment  Last BM:  3/31 type 6  Height:   Ht Readings  from Last 1 Encounters:  02/16/20 5\' 2"  (1.575 m)    Weight:   Wt Readings from Last 1 Encounters:  02/17/20 55 kg   BMI:  Body mass index is 22.18 kg/m.  Estimated Nutritional Needs:   Kcal:  1700-1900 kcal  Protein:  85-95 g  Fluid:  >/= 1.7 L    04/18/20, RD, LDN, CNSC Please refer to Amion for contact information.

## 2020-02-17 NOTE — Progress Notes (Addendum)
NAME:  Dawn Beasley, MRN:  [id number], DOB:  01/24/1951, LOS: 5 ADMISSION DATE:  02/11/2020, CONSULTATION DATE:  02/11/2020 REFERRING MD:  TRH, CHIEF COMPLAINT:  Respiratory failure   Brief History   Dawn Beasley Is a 69 y.o. woman with recent EGD for nausea, vomiting, weight loss over the last 2 years. Persistent hoarse voice after procedure. Came in from home with altered mental status and emesis. Intubated in the ED for clinical coma.   Consults:  None  Procedures:  Intubation 3/27 > 3/29 Transfer out on 3/30  Transferred back to ICU on 4/1  Significant Diagnostic Tests:  3/27 chest xray with right middle and lower lobe airspace opacities concerning for aspiration CTA chest 3/31 > acute pulmonary embolus within segmental branch of the right lower lobe. Interval development of bilateral pleural effusions L > R. Dense opacification bilaterally.  CT head 3/31 > No evidence of acute intracranial abnormality. Stable chronic small vessel ischemic disease. 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 Echo 3/29 > LVEF 65-70 %. Mild reduced RVSF. ? Of McConnells sign.   Micro Data:  MRSA PCR negative  Antimicrobials:  Vancomycin 3/27 > 3/28 Cefepime 3/27 > 3/28 Unasyn 3/28 > 3/31 Cefepime 3/31 > Flagyl 3/31 >  Interim history/subjective:  Agitated overnight.  Precedex ceiling increased  Now resting comfortably on BiPAP  Objective   Blood pressure (!) 153/91, pulse 88, temperature 99 F (37.2 C), temperature source Axillary, resp. rate (!) 32, height 5\' 2"  (1.575 m), weight 55 kg, SpO2 98 %.    Vent Mode: BIPAP FiO2 (%):  [40 %-100 %] 40 % Set Rate:  [12 bmp] 12 bmp PEEP:  [6 cmH20] 6 cmH20   Intake/Output Summary (Last 24 hours) at 02/17/2020 0701 Last data filed at 02/17/2020 0600 Gross per 24 hour  Intake 2393.86 ml  Output 200 ml  Net 2193.86 ml   Filed Weights   02/16/20 0336 02/16/20 0800 02/17/20 0500  Weight: 57.2 kg 57.2 kg 55  kg    Examination:  General: frail elderly female in mild respiratory distress HENT: Dawn Beasley/AT, PERRL, no JVD Lungs: Coarse expiratory wheeze Cardiovascular: RRR, no MRG. No edema.  Abdomen: Soft, non-distended, non-tender.  Extremities: No significant edema. NO acute deformity. Equal strength bilaterally.  Neuro: Resting comfortably  Resolved Hospital Problem list   Septic shock, AKI,   Assessment & Plan:   Acute hypoxemic respiratory failure: Successfully extubated 3/29 Aspiration pneumonia: Right sided Tobacco use disorder - 1ppd smoker RLE PE, McConell's sign noted on 3/27 echo - Supplemental O2 to maintain SpO2 90-95% with PRN BiPAP - At risk for re-intubation - Continuing cefepime, flagyl  - Back to NPO status, then swallowing precautions, SLP following - Insert Cor-trak for nutrition and medications - Scheduled levalbuterol and atrovent  - Continue heparin gtt  Possible COPD: undiagnosed, but she is a long time smoker, and current smoker, and has wheeze on exam.  - Inhaled bronchodilators,  levalbuterol and atrovent   - Systemic steroids, Solumedrol 60 mg BID - Will need PFT if she is able to recover.   Pulmonary embolism: R lower lobe segment. I doubt this is a significant contributor to her symptoms. Lower extremity dopplers negative.  - Supplemental O2 as above.  - Heparin infusion for now.  - No role for thrombolytic therapy of any kind.   Acute metabolic encephalopathy:  - Unclear if this is EtOH withdrawal given timecourse. Could be delirium or metabolic derangements  - Attempt to wean  Precedex for agitation to keep RASS 0 to -1.  - RN neurochecks  Elevated troponin - suspect demand ischemia. EKG with no clear ischemic change.  - Troponin peaked and began to trend down 3/31 PM  Hypernatremia - Free water deficit of 1.4L  - Start 200cc free water every 4 hours  History hypertension - Restart home losartan when stable to do so  Chronic emesis,  gastropathy, abdominal discomfort GERD - On chronic pantoprazole 40 mg twice daily   Best practice:  Diet: Clear liquids with thickener per SLP, but now back to NPO. Will place cortrak for TF Pain/Anxiety/Delirium protocol (if indicated): Precedex infusion VAP protocol (if indicated): NA DVT prophylaxis: Lovenox GI prophylaxis: Pantoprazole Glucose control: N/A Mobility: bed rest Code Status: Full Family Communication: Update family Disposition: To ICU  Labs   CBC: Recent Labs  Lab 02/12/20 0529 02/12/20 0529 02/13/20 0225 02/13/20 0225 02/14/20 0303 02/15/20 0239 02/15/20 1035 02/16/20 0350 02/17/20 0229  WBC 2.8*   < > 10.0  --  12.0* 17.4* 20.7* 16.0*  --   NEUTROABS 2.5  --  8.9*  --  10.7* 15.2*  --  13.4*  --   HGB 8.3*   < > 8.0*   < > 7.6* 8.5* 9.0* 8.6* 8.2*  HCT 24.6*   < > 22.6*   < > 22.4* 25.3* 27.4* 26.7* 24.0*  MCV 117.7*   < > 114.1*  --  116.7* 118.2* 123.4* 123.6*  --   PLT 156   < > 148*  --  158 189 208 218  --    < > = values in this interval not displayed.    Basic Metabolic Panel: Recent Labs  Lab 02/12/20 0008 02/12/20 0036 02/12/20 1610 02/12/20 9604 02/13/20 0225 02/14/20 0303 02/15/20 0239 02/15/20 1035 02/16/20 0350 02/16/20 1136 02/16/20 1529 02/17/20 0229  NA 129*   < > 130*   < > 136 143 144  --  151*  --   --  148*  K 3.9   < > 3.9   < > 3.5 3.1* 3.0*  --  4.7  --   --  3.7  CL 89*   < > 94*  --  100 107 108  --  111  --   --   --   CO2 27   < > 24  --  22 29 26   --  28  --   --   --   GLUCOSE 95   < > 91  --  105* 133* 137*  --  126*  --   --   --   BUN 47*   < > 48*  --  38* 28* 18  --  24*  --   --   --   CREATININE 1.39*   < > 1.32*  --  0.91 0.57 0.52  --  0.51  --   --   --   CALCIUM 8.3*   < > 8.1*  --  8.3* 8.6* 8.8*  --  8.8*  --   --   --   MG 2.0  --  2.1  --   --   --  1.9  --   --  2.0 2.0  --   PHOS 4.8*  --  5.1*  --   --   --   --  2.2*  --  3.0 2.9  --    < > = values in this interval not displayed.  GFR: Estimated Creatinine Clearance: 54.7 mL/min (by C-G formula based on SCr of 0.51 mg/dL). Recent Labs  Lab 02/12/20 0008 02/12/20 0529 02/12/20 0715 02/13/20 0225 02/14/20 0303 02/15/20 0239 02/15/20 1035 02/16/20 0350  PROCALCITON 20.86  --   --   --   --   --   --   --   WBC  --    < >  --    < > 12.0* 17.4* 20.7* 16.0*  LATICACIDVEN 2.3*  --  1.7  --   --   --   --   --    < > = values in this interval not displayed.    Liver Function Tests: Recent Labs  Lab 02/12/20 0008 02/12/20 0529 02/13/20 0225 02/14/20 0303 02/16/20 0350  AST 61* 70* 68* 99* 40  ALT 22 23 27  46* 51*  ALKPHOS 18* 23* 45 92 94  BILITOT 1.1 1.2 1.6* 0.9 1.0  PROT 5.3* 5.1* 4.9* 5.1* 5.5*  ALBUMIN 2.4* 2.3* 2.0* 2.1* 2.4*   No results for input(s): LIPASE, AMYLASE in the last 168 hours. No results for input(s): AMMONIA in the last 168 hours.  ABG    Component Value Date/Time   PHART 7.419 02/17/2020 0229   PCO2ART 41.9 02/17/2020 0229   PO2ART 67.0 (L) 02/17/2020 0229   HCO3 27.1 02/17/2020 0229   TCO2 28 02/17/2020 0229   ACIDBASEDEF 4.0 (H) 02/12/2020 0511   O2SAT 93.0 02/17/2020 0229     Coagulation Profile: Recent Labs  Lab 02/12/20 0008  INR 1.1    Cardiac Enzymes: Recent Labs  Lab 02/12/20 0008 02/12/20 0529  CKTOTAL 1,746* 1,775*    HbA1C: No results found for: HGBA1C  CBG: Recent Labs  Lab 02/16/20 1134 02/16/20 1522 02/16/20 1910 02/16/20 2345 02/17/20 0328  GLUCAP 141* 146* 122* 126* 138*    Critical care time: N/a    04/18/20, MD  [facility] PGY3  See Amion for personal pager PCCM on call pager [phone]  02/17/2020 7:01 AM

## 2020-02-17 NOTE — Progress Notes (Signed)
SLP Cancellation Note  Patient Details Name: VUNG KUSH MRN: [id number] DOB: 20-Nov-1950   Cancelled treatment:       Reason Eval/Treat Not Completed: Medical issues which prohibited therapy.  Per chart review pt is currently on BiPAP and is therefore not appropriate for PO trials.  SLP will f/u as appropriate and as schedule allows.    Shanon Rosser Brenlee Koskela 02/17/2020, 8:49 AM

## 2020-02-17 NOTE — Progress Notes (Signed)
NAME:  Dawn Beasley, MRN:  [id number], DOB:  Oct 05, 1951, LOS: 5 ADMISSION DATE:  02/11/2020, CONSULTATION DATE:  02/15/20 REFERRING MD:  Dr. Sharolyn Douglas, CHIEF COMPLAINT:  Acute Hypoxic Respiratory Failure in setting of acute semental PE and multifocal PNA  Brief History   Dawn Beasley a 69 y.o.woman with significant past medical history of HTN, chronic nausea and vomiting, tobacco use, alcohol abuse, c/f benzodiazepine and opioid abuse, spinal stenosis, COPD, and an aortic aneurysm who was originally admitted on 3/27 for acute hypoxic respiratory failure and aspiration pneumonia after a recent EGD for nausea, vomiting, weight loss over the last 2 years. Persistent hoarse voice after procedure.Came in from home with altered mental status and emesis. Intubated in the ED for clinical coma and extubated 3/29.She was recently transferred from the MICU to the floor on 3/30 and unfortunately transferred back to the ICU on 3/31 due to declining respiratory status, multifocal PNA found on CXR, and new right segmental PE found on chest CTA.   Significant Hospital Events   Rapid response 3/31  Consults:  N/A  Procedures:  Intubation 3/27 > 3/29  Significant Diagnostic Tests:  3/27 CXR with right middle and lower lobe airspace opacities concerning for aspiration 3/31 CXR with bilateral pulmonary infiltrates, worse in RUL, consistent with multifocal PNA 3/31 Chest CTA with focal embolus within segmental branch of RLL pulmonary artery w/o evidence of right heart strain  Micro Data:  MRSA PCR negative  Antimicrobials:  Vancomycin 3/27>>3/28 Cefepime 3/27 >>3/28 Unasyn 3/28 > 3/31 Flagyl 3/31 > 4/2 Cefepime 3/31 >   Interim history/subjective:  Heparin increased to 1100U/hr from 1050U/hr given low therapeutic heparin level at 0.33 (goal 0.3-0.7) on 4/1 PM. Yesterday evening, she began having increased WOB with a RR in the mid-to-upper 30s. 6L HFNC was witched to PRN BiPAP. She initially  improved after 1 hr of O2 supplementation, however, she continued switching between HFNC and BiPAP given variability in respiratory status. Repeat heparin level at 12AM still lowly therapeutic, so heparin gtt was increased to 1200U which is now within therapeutic range. 2AM this morning she became agitated and was attempting to remove her BiPAP and was found to have a CIWA 26 despite Precedex at max rate of 1.0. During this time she became increasingly more confused and WOB continued to increase with RR 45-50. She also became increasingly more hypertensive with systolic Bps ranging between 130s to high 160s. Precedex increased to max rate of 1.2 and Ativan was prescribed and administered per CIWA protocol x1 given increasing concern for EtOH and/or Benzodiazepine withdrawal.  Relevant labs were drawn including I-STAT 7 (pH 7.419, pCO2 41.9, pO2 67, bicarb 27.1), glc 140, CMP (Na 149, K 4.1, Cl 112, bicarb 23, BUN 30, Cr 0.64), ammonia 43, Mg 2.2, Phos 2.6, CBC (WBC 18.6, Hb 7.6, Hct 23.4, MCV 121.9, Plt 261). Pending labs include Vitamin B12, Iron and TIBC, ferritin, and folate level.  This morning during AM interview she was asleep but aroused to shoulder rub. Audible wheezing and cough was appreciated during questioning. She subjectively feels that her breathing is better from last night and early this morning. She is currently on 50% BiPAP with O2 sat 100%. Mental status and further investigation of withdrawal symptoms were difficult to assess as it was difficult to hear her respond through her BiPAP and vice versa.    Objective   Blood pressure (!) 156/91, pulse 91, temperature (!) 97.5 F (36.4 C), temperature source Oral, resp. rate (!) 25, height  5\' 2"  (1.575 m), weight 55 kg, SpO2 100 %.    Vent Mode: BIPAP FiO2 (%):  [40 %-50 %] 50 % Set Rate:  [12 bmp] 12 bmp PEEP:  [6 cmH20] 6 cmH20   Intake/Output Summary (Last 24 hours) at 02/17/2020 1305 Last data filed at 02/17/2020 0900 Gross per 24  hour  Intake 1680.48 ml  Output 550 ml  Net 1130.48 ml   Filed Weights   02/16/20 0336 02/16/20 0800 02/17/20 0500  Weight: 57.2 kg 57.2 kg 55 kg    Examination: General: Asleep but awakens with arousal, tired-appearing, but in NAD, responds to questioning HENT: Normocephalic atraumatic, PEERL Lungs: Significant expiratory wheeze audibly and on exam with coarse breath sounds Cardiovascular: Distant heart sounds with normal S1/S2, no murmurs, rubs, or gallops Extremities: No LE edema bilaterally Skin: No skin mottling or excess warmth, no mild bruising appreciated on bilateral anterior tibia  Resolved Hospital Problem list   AKI Septic shock 3/31  Assessment & Plan:  Dawn Beasley is a 69 y.o. woman with chronic nausea, vomiting who initially presented with aspiration pneumonia after a recent EGD.  Acute Hypoxemic Respiratory Failure: Likely 2/2 Acute Right Segmental PE, Multifocal Pneumonia, COPD exacerbation given history of tobacco use, all indicative of a possible multifactorial process. PNA and COPD likely larger contributors to her respiratory failure than her segmental PE.  She also has a CIWA 26 c/f EtOH and/or benzodiazepine withdrawal, as well as a possible history of opioid abuse. This could be causing CNS decreased in her respiratory drive furthermore contributing to her respiratory failure. AM I-STAT 7 showed improvement in blood gases (pH 7.419, pCO2 41.9, pO2 67, bicarb 27.1). - Continue supplemental O2 for SpO2 90-55% with BiPAP PRN; wean as tolerated - Will place Cor-Trak for nutrition, fluids, and medication today - D5 NS decreased to 50 cc/hr from 100 cc; will consider d/c'ing was Cor-Trak is placed to prevent fluid overload and hyperglyemia - SLP following, appreciate recs  Acute Right Segmental Pulmonary Embolism: Found on CTA. Respiratory status currently stable on 50% FiO2 BiPAP though RR still in 30s today. - Continue supplemental and wean as tolerated -  Continue Heparin GTT 1200U/hr - Daily heparin level - Will consider repeat echo at f/u appointment once PE has been adequately treated and resolves  Multifocal Pneumonia: Found on CXR and Chest CTA. Originally being treated for aspiration pneumonia with Unasyn, but completed 5 day course on 3/31. Will narrow antibiotics today. - D/C'd Unasyn on 3/31 (5 day course starting on 3/27) - D/C Flagyl 500 mg IV - Continue Cefepime 2g IV - Repeat CXR today given newly diagnosed multifocal pna, worsening respiratory status over past 12 hrs, and concern for possible parapneumonic effusion - Daily CBC and CMP  Acute Metabolic Encephalopathy/Delirium: Likely due to above leading to possible critical illness delirium. Patient also has history of questionable alcohol use as well as unspecified prescriptions for opioids and benzodiazepines, concerning for more of a toxic cause of encephalopathy. Encephalopathy initially improved from 2 days prior after starting Precedex (she had waxing and waning consciousness and significant agitation), however she became progressively worse overnight and required increase in Precedex and administration of Ativan per CIWA protocol given score of 26 now c/f delirium. She was also started on systemic steroids yesterday for her COPD exacerbation which could be contributing to her delirium, making this a possible multifactorial process. - Manage above - Continue Precedex 1.2 and attempt to wean as tolerated for RASS 0 to -1 - Ativan  PRN per CIWA protocol - Manage COPD as below - RN neurochecks  Acute COPD Exacerbation: Lifelong smoker now with wheezing. Treating as acute COPD exacerbation. Today with more significant wheezing from day prior as well as increased agitation and delirium since initiating Solumedrol yesterday.  - Continue abx course of Cefepime and Flagyl - Decrease IV Solumedrol to 40 mg qD from 60 mg BID with plan to rapidly taper - Continue Atrovent Neb 0.5 mg  q6h - Continue Levabuterol neb 0.63mg  q6h - Continue Levabuterol neb 0.63mg  q2h PRN - Will encourage outpatient follow up for COPD for PFTs - Will likely send home with albuterol inhaler  Hypernatremia: Likely due to loss of hypotonic fluid 2/2 dehydration. Could also be due to impaired access to free water as she was intubated between 3/27 - 3/29, had a recent change in mental status due to metabolic encephalopathy yesterday and is elderly. Noted to have 1.4L free water deficit yesterday. Na beginning to decline today. - Start free water infusion 200 cc/hr q4h - Continue D5 NS at 50 cc/hr for now pending re-evaluation s/p Cor-Trak placement - Continue to monitor  Macrocytic Anemia: Chronic. Hb initially stable between 8-9 though 7.6 today. MCV now in 120s, up from 110s throughout hospital course. No active signs of bleeding appreciated. Anemia could be contributed to alcohol abuse or clinical status (anemia of chronic disease). - Continue folate 1 mg PO  - F/u folate, ferritin, B12, Iron and TIBC - Transfuse 1 unit pRBCs if Hb <7, or active bleeding  Elevated Troponins: Now down-trending from 1369 to 1211. Likely 2/2 demand ischemia due to recent diagnosis of PE and multifocal PNA.  - Continue to monitor  History of Hypertension: - Restart home losartan when stable to do so  Chronic emesis, gastropathy, abdominal discomfort GERD - On chronic pantoprazole 40 mg twice daily  Best practice:  Diet: Cor-Ttack Pain/Anxiety/Delirium protocol (if indicated): Ativan VAP protocol (if indicated): N/A DVT prophylaxis: Heparin GI prophylaxis: Pantoprazole Glucose control: N/A Mobility: Bed rest Code Status: Full Family Communication: Updated patient and husband at bedside 4/1 Disposition: ICU  Labs   CBC: Recent Labs  Lab 02/13/20 0225 02/13/20 0225 02/14/20 0303 02/14/20 0303 02/15/20 0239 02/15/20 1035 02/16/20 0350 02/17/20 0229 02/17/20 0646  WBC 10.0   < > 12.0*  --  17.4*  20.7* 16.0*  --  18.6*  NEUTROABS 8.9*  --  10.7*  --  15.2*  --  13.4*  --  17.4*  HGB 8.0*   < > 7.6*   < > 8.5* 9.0* 8.6* 8.2* 7.6*  HCT 22.6*   < > 22.4*   < > 25.3* 27.4* 26.7* 24.0* 23.4*  MCV 114.1*   < > 116.7*  --  118.2* 123.4* 123.6*  --  121.9*  PLT 148*   < > 158  --  189 208 218  --  261   < > = values in this interval not displayed.    Basic Metabolic Panel: Recent Labs  Lab 02/12/20 0529 02/12/20 0529 02/13/20 0225 02/13/20 0225 02/14/20 0303 02/15/20 0239 02/15/20 1035 02/16/20 0350 02/16/20 1136 02/16/20 1529 02/17/20 0229 02/17/20 0646  NA 130*   < > 136   < > 143 144  --  151*  --   --  148* 149*  K 3.9   < > 3.5   < > 3.1* 3.0*  --  4.7  --   --  3.7 4.1  CL 94*   < > 100  --  107 108  --  111  --   --   --  112*  CO2 24   < > 22  --  29 26  --  28  --   --   --  23  GLUCOSE 91   < > 105*  --  133* 137*  --  126*  --   --   --  147*  BUN 48*   < > 38*  --  28* 18  --  24*  --   --   --  30*  CREATININE 1.32*   < > 0.91  --  0.57 0.52  --  0.51  --   --   --  0.64  CALCIUM 8.1*   < > 8.3*  --  8.6* 8.8*  --  8.8*  --   --   --  8.4*  MG 2.1  --   --   --   --  1.9  --   --  2.0 2.0  --  2.2  PHOS 5.1*  --   --   --   --   --  2.2*  --  3.0 2.9  --  2.6   < > = values in this interval not displayed.   GFR: Estimated Creatinine Clearance: 54.7 mL/min (by C-G formula based on SCr of 0.64 mg/dL). Recent Labs  Lab 02/12/20 0008 02/12/20 0529 02/12/20 0715 02/13/20 0225 02/15/20 0239 02/15/20 1035 02/16/20 0350 02/17/20 0646  PROCALCITON 20.86  --   --   --   --   --   --   --   WBC  --    < >  --    < > 17.4* 20.7* 16.0* 18.6*  LATICACIDVEN 2.3*  --  1.7  --   --   --   --   --    < > = values in this interval not displayed.    Liver Function Tests: Recent Labs  Lab 02/12/20 0529 02/13/20 0225 02/14/20 0303 02/16/20 0350 02/17/20 0646  AST 70* 68* 99* 40 30  ALT 23 27 46* 51* 44  ALKPHOS 23* 45 92 94 73  BILITOT 1.2 1.6* 0.9 1.0 0.7    PROT 5.1* 4.9* 5.1* 5.5* 5.3*  ALBUMIN 2.3* 2.0* 2.1* 2.4* 2.5*   No results for input(s): LIPASE, AMYLASE in the last 168 hours. Recent Labs  Lab 02/17/20 0646  AMMONIA 43*    ABG    Component Value Date/Time   PHART 7.419 02/17/2020 0229   PCO2ART 41.9 02/17/2020 0229   PO2ART 67.0 (L) 02/17/2020 0229   HCO3 27.1 02/17/2020 0229   TCO2 28 02/17/2020 0229   ACIDBASEDEF 4.0 (H) 02/12/2020 0511   O2SAT 93.0 02/17/2020 0229     Coagulation Profile: Recent Labs  Lab 02/12/20 0008  INR 1.1    Cardiac Enzymes: Recent Labs  Lab 02/12/20 0008 02/12/20 0529  CKTOTAL 1,746* 1,775*    HbA1C: No results found for: HGBA1C  CBG: Recent Labs  Lab 02/16/20 1910 02/16/20 2345 02/17/20 0328 02/17/20 0728 02/17/20 1113  GLUCAP 122* 126* 138* 140* 138*    Review of Systems:   See interim history, otherwise ROS negative  Past Medical History  She,  has a past medical history of AAA (abdominal aortic aneurysm) ([place]), Anxiety, Arthritis, Back pain, Cervical disc herniation, Depression, 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   Surgical History    Past Surgical History:  Procedure  Laterality Date  . APPENDECTOMY    . BACK SURGERY    . CARPAL TUNNEL RELEASE    . LUMBAR LAMINECTOMY/DECOMPRESSION MICRODISCECTOMY N/A 04/12/2014   Procedure: MICRO LUMBAR DECOMPRESSION LUMBAR THREE TO FOUR,LUMBAR  FOUR TO FIVE;  Surgeon: Javier Docker, MD;  Location: [facility];  Service: Orthopedics;  Laterality: N/A;  . MAXOFACIAL SURGERY    . SPINAL CORD STIMULATOR INSERTION N/A 12/06/2015   Procedure: LUMBAR SPINAL CORD STIMULATOR INSERTION;  Surgeon: Venita Lick, MD;  Location: [facility] OR;  Service: Orthopedics;  Laterality: N/A;     Social History   reports that she has been smoking. She has a 20.00 pack-year smoking history. She does not have any smokeless tobacco history on file. She reports current alcohol use. She  reports that she does not use drugs.   Family History   Her family history includes Cancer in her sister.   Allergies Allergies  Allergen Reactions  . Celebrex [Celecoxib] Swelling    Throat swelling  . Sulfa Antibiotics Swelling    Throat swelling     Home Medications  Prior to Admission medications   Medication Sig Start Date End Date Taking? Authorizing Provider  ALPRAZolam Prudy Feeler) 1 MG tablet Take 0.5 tablets (0.5 mg total) by mouth 2 (two) times daily. 12/25/16  Yes Riccio, Angela C, DO  finasteride (PROSCAR) 5 MG tablet Take 5 mg by mouth daily.   Yes [provider]  losartan (COZAAR) 100 MG tablet Take 100 mg by mouth daily.   Yes [provider]  ondansetron (ZOFRAN) 4 MG tablet Take 1 tablet (4 mg total) by mouth every 8 (eight) hours as needed for nausea or vomiting. 12/06/15  Yes Venita Lick, MD  oxyCODONE-acetaminophen (PERCOCET) 10-325 MG tablet Take 1 tablet by mouth every 4 (four) hours as needed for pain. 12/06/15  Yes Venita Lick, MD  pantoprazole (PROTONIX) 40 MG tablet Take 1 tablet (40 mg total) by mouth 2 (two) times daily. 02/06/20  Yes Meryl Dare, MD  pravastatin (PRAVACHOL) 40 MG tablet Take 40 mg by mouth daily. 12/08/16  Yes [provider]  traZODone (DESYREL) 100 MG tablet Take 100 mg by mouth at bedtime as needed for sleep.   Yes [provider]  gabapentin (NEURONTIN) 600 MG tablet Take 1 tablet (600 mg total) by mouth 3 (three) times daily. Patient not taking: Reported on 02/12/2020 12/25/16   Tillman Sers, DO     Critical care time: N/A    Written by Jeanella Flattery, MS4

## 2020-02-17 NOTE — Progress Notes (Signed)
Patient taken off BiPAP at this time for Cortrak placement. Patient placed on 10L salter high flow. Tolerating well at this time. RT will continue to monitor.

## 2020-02-17 NOTE — Procedures (Signed)
Cortrak  Person Inserting Tube:  Naylani Bradner E, RD Tube Type:  Cortrak - 43 inches Tube Location:  Left nare Initial Placement:  Stomach Secured by: Bridle Technique Used to Measure Tube Placement:  Documented cm marking at nare/ corner of mouth Cortrak Secured At:  70 cm   Cortrak Tube Team Note:  Consult received to place a Cortrak feeding tube.   No x-ray is required. RN may begin using tube.   If the tube becomes dislodged please keep the tube and contact the Cortrak team at [email or web address] ([email or web address]) for replacement.  If after hours and replacement cannot be delayed, place a NG tube and confirm placement with an abdominal x-ray.    Amon Costilla, MS, RD, LDN RD pager number and weekend/on-call pager number located in Amion.    

## 2020-02-17 NOTE — Progress Notes (Signed)
ANTICOAGULATION CONSULT NOTE - Follow Up Consult  Pharmacy Consult for heparin Indication: pulmonary embolus  Labs: Recent Labs    02/14/20 0303 02/14/20 0303 02/15/20 0239 02/15/20 0239 02/15/20 1035 02/15/20 1409 02/15/20 1801 02/15/20 2328 02/16/20 0350 02/16/20 1529 02/16/20 2321  HGB 7.6*   < > 8.5*   < > 9.0*  --   --   --  8.6*  --   --   HCT 22.4*   < > 25.3*  --  27.4*  --   --   --  26.7*  --   --   PLT 158   < > 189  --  208  --   --   --  218  --   --   HEPARINUNFRC  --   --   --   --   --   --   --    < > 0.25* 0.33 0.32  CREATININE 0.57  --  0.52  --   --   --   --   --  0.51  --   --   TROPONINIHS  --   --   --   --   --  1,369* 1,211*  --   --   --   --    < > = values in this interval not displayed.    Assessment: 69yo female remains therapeutic on heparin though at lower end of goal with slightly lower heparin level despite rate increase; no gtt issues or signs of bleeding per RN other than some mild bleeding when IV site was pulled, easily resolved.  Goal of Therapy:  Heparin level 0.3-0.7 units/ml   Plan:  Will increase heparin gtt slightly to 1200 units/hr and check level with am labs.    Vernard Gambles, PharmD, BCPS  02/17/2020,12:05 AM

## 2020-02-17 NOTE — Progress Notes (Signed)
Pt increasingly confused and agitated over the course of the shift, RR mid 40s and WOB labored. Received orders to initiate bipap prn earlier in the shift, but pt was not able to tolerate the mask and kept pulling it off. Now, pt is more agitated, now purse-lip breathing, with RR 45-50 and becoming hypertensive. ELink MD notified, pt placed back on bipap. PRN ativan ordered by MD and given to assist with agitation. Precedex gtt at 1.2, CIWA currently 26. Maintaining pt on bipap at this time.

## 2020-02-17 NOTE — Progress Notes (Signed)
ANTICOAGULATION CONSULT NOTE - Follow-up Consult  Pharmacy Consult for Heparin Indication: pulmonary embolus  Allergies  Allergen Reactions  . Celebrex [Celecoxib] Swelling    Throat swelling  . Sulfa Antibiotics Swelling    Throat swelling    Patient Measurements: Height: 5\' 2"  (157.5 cm) Weight: 55 kg (121 lb 4.1 oz) IBW/kg (Calculated) : 50.1 Heparin Dosing Weight: 57.3 kg  Vital Signs: Temp: 98 F (36.7 C) (04/02 0700) Temp Source: Axillary (04/02 0700) BP: 153/91 (04/02 0600) Pulse Rate: 88 (04/02 0600)  Labs: Recent Labs     0000 02/15/20 0239 02/15/20 0239 02/15/20 1035 02/15/20 1409 02/15/20 1801 02/15/20 2328 02/16/20 0350 02/16/20 0350 02/16/20 1529 02/16/20 2321 02/17/20 0229 02/17/20 0646  HGB   < > 8.5*   < > 9.0*  --   --   --  8.6*  --   --   --  8.2*  --   HCT  --  25.3*   < > 27.4*  --   --   --  26.7*  --   --   --  24.0*  --   PLT  --  189  --  208  --   --   --  218  --   --   --   --   --   HEPARINUNFRC  --   --   --   --   --   --    < > 0.25*   < > 0.33 0.32  --  0.63  CREATININE  --  0.52  --   --   --   --   --  0.51  --   --   --   --   --   TROPONINIHS  --   --   --   --  1,369* 1,211*  --   --   --   --   --   --   --    < > = values in this interval not displayed.    Estimated Creatinine Clearance: 54.7 mL/min (by C-G formula based on SCr of 0.51 mg/dL).   Medical History: Past Medical History:  Diagnosis Date  . AAA (abdominal aortic aneurysm) ([facility])   . Anxiety   . Arthritis   . Back pain   . Cervical disc herniation   . Depression   . 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 Hypercholesteremia   . Hypertension   . Panic disorder   . Stenosis, spinal, lumbar     Assessment: 69 yr old female with medical history significant for GERD, wt loss, esophagitis, AAA, HTN, HLD was admitted on 02/12/20 for sepsis due to aspiration PNA. Pt had recent EGD/colonoscopy and was found to have gastritis, with recent episode of hematemesis.  Chest CTA showed acute PE within segmental branch of RLL pulmonary artery. Pharmacy is consulted to dose heparin for new PE. Pt was on no anticoagulation PTA.  HL therapeutic at 0.63, CBC stable.   Goal of Therapy:  Heparin level 0.3-0.7 units/ml Monitor platelets by anticoagulation protocol: Yes   Plan:   Continue heparin gtt 1200 units/hr Monitor daily heparin level, CBC Monitor for signs/symptoms of bleeding (recent episode of hematemesis)  02/14/20, PharmD PGY1 Ambulatory Care Resident [employer] # [phone]

## 2020-02-17 NOTE — Progress Notes (Signed)
eLink Physician-Brief Progress Note Patient Name: Dawn Beasley DOB: [birth year] MRN: [id number]   Date of Service  02/17/2020  HPI/Events of Note  Concern for EtOH withdrawal. Patient admitted ~4 days ago. Drinks EtOH + uses benzodiazepines at home. Now agitated, scoring 26 on CIWA despite Precedex at max rate.  Patient also on BiPAP although trying to take it off given agitation above. Tachypneic.   eICU Interventions  Ativan 0.5-1.0mg  IV Q4H PRN agitation c/f EtOH/benzodiazepine withdrawal.  Will need to be cautious with her tenuous respiratory status.  Has not had a blood gas done in several days. Will obtain one now to see where we stand with respect to the hypercarbia seen previously.     Intervention Category Minor Interventions: Agitation / anxiety - evaluation and management  Marveen Reeks Keirra Zeimet 02/17/2020, 2:02 AM

## 2020-02-18 LAB — COMPREHENSIVE METABOLIC PANEL
ALT: 37 U/L (ref 0–44)
AST: 20 U/L (ref 15–41)
Albumin: 2.4 g/dL — ABNORMAL LOW (ref 3.5–5.0)
Alkaline Phosphatase: 77 U/L (ref 38–126)
Anion gap: 9 (ref 5–15)
BUN: 25 mg/dL — ABNORMAL HIGH (ref 8–23)
CO2: 28 mmol/L (ref 22–32)
Calcium: 8.2 mg/dL — ABNORMAL LOW (ref 8.9–10.3)
Chloride: 112 mmol/L — ABNORMAL HIGH (ref 98–111)
Creatinine, Ser: 0.58 mg/dL (ref 0.44–1.00)
GFR calc Af Amer: >60 mL/min (ref >60)
GFR calc non Af Amer: >60 mL/min (ref >60)
Glucose, Bld: 145 mg/dL — ABNORMAL HIGH (ref 70–99)
Potassium: 3.9 mmol/L (ref 3.5–5.1)
Sodium: 149 mmol/L — ABNORMAL HIGH (ref 135–145)
Total Bilirubin: 0.7 mg/dL (ref 0.3–1.2)
Total Protein: 5.2 g/dL — ABNORMAL LOW (ref 6.5–8.1)

## 2020-02-18 LAB — CBC WITH DIFFERENTIAL/PLATELET
Abs Immature Granulocytes: 0.48 10*3/uL — ABNORMAL HIGH (ref 0.00–0.07)
Basophils Absolute: 0 10*3/uL (ref 0.0–0.1)
Basophils Relative: 0 %
Eosinophils Absolute: 0 10*3/uL (ref 0.0–0.5)
Eosinophils Relative: 0 %
HCT: 24.2 % — ABNORMAL LOW (ref 36.0–46.0)
Hemoglobin: 7.8 g/dL — ABNORMAL LOW (ref 12.0–15.0)
Immature Granulocytes: 2 %
Lymphocytes Relative: 2 %
Lymphs Abs: 0.4 10*3/uL — ABNORMAL LOW (ref 0.7–4.0)
MCH: 39.4 pg — ABNORMAL HIGH (ref 26.0–34.0)
MCHC: 32.2 g/dL (ref 30.0–36.0)
MCV: 122.2 fL — ABNORMAL HIGH (ref 80.0–100.0)
Monocytes Absolute: 0.8 10*3/uL (ref 0.1–1.0)
Monocytes Relative: 4 %
Neutro Abs: 21.3 10*3/uL — ABNORMAL HIGH (ref 1.7–7.7)
Neutrophils Relative %: 92 %
Platelets: 306 10*3/uL (ref 150–400)
RBC: 1.98 MIL/uL — ABNORMAL LOW (ref 3.87–5.11)
RDW: 15 % (ref 11.5–15.5)
WBC: 23 10*3/uL — ABNORMAL HIGH (ref 4.0–10.5)
nRBC: 0.6 % — ABNORMAL HIGH (ref 0.0–0.2)

## 2020-02-18 LAB — GLUCOSE, CAPILLARY
Glucose-Capillary: 123 mg/dL — ABNORMAL HIGH (ref 70–99)
Glucose-Capillary: 133 mg/dL — ABNORMAL HIGH (ref 70–99)
Glucose-Capillary: 141 mg/dL — ABNORMAL HIGH (ref 70–99)
Glucose-Capillary: 150 mg/dL — ABNORMAL HIGH (ref 70–99)
Glucose-Capillary: 156 mg/dL — ABNORMAL HIGH (ref 70–99)
Glucose-Capillary: 158 mg/dL — ABNORMAL HIGH (ref 70–99)
Glucose-Capillary: 180 mg/dL — ABNORMAL HIGH (ref 70–99)

## 2020-02-18 LAB — HEPARIN LEVEL (UNFRACTIONATED)
Heparin Unfractionated: 0.87 IU/mL — ABNORMAL HIGH (ref 0.30–0.70)
Heparin Unfractionated: 0.84 IU/mL — ABNORMAL HIGH (ref 0.30–0.70)
Heparin Unfractionated: 0.48 IU/mL (ref 0.30–0.70)

## 2020-02-18 MED ORDER — FREE WATER
350.0000 mL | Status: DC
  Administered 2020-02-18 – 2020-02-20 (×11): 350 mL

## 2020-02-18 MED ORDER — THIAMINE HCL 100 MG PO TABS
100.0000 mg | ORAL_TABLET | Freq: Every day | ORAL | Status: DC
  Administered 2020-02-18 – 2020-03-02 (×14): 100 mg
  Filled 2020-02-18 (×14): qty 1

## 2020-02-18 NOTE — Progress Notes (Signed)
ANTICOAGULATION CONSULT NOTE  Pharmacy Consult for Heparin Indication: pulmonary embolus  Allergies  Allergen Reactions  . Celebrex [Celecoxib] Swelling    Throat swelling  . Sulfa Antibiotics Swelling    Throat swelling    Patient Measurements: Height: 5\' 2"  (157.5 cm) Weight: 55 kg (121 lb 4.1 oz) IBW/kg (Calculated) : 50.1 Heparin Dosing Weight: 57.3 kg  Vital Signs: Temp: 98.2 F (36.8 C) (04/03 1931) Temp Source: Oral (04/03 1931) BP: 156/80 (04/03 1300) Pulse Rate: 116 (04/03 1504)  Labs: Recent Labs    02/16/20 0350 02/16/20 1529 02/17/20 0646 02/17/20 0646 02/17/20 1648 02/18/20 0322 02/18/20 1007 02/18/20 2008  HGB 8.6*   < > 7.6*   < > 8.0* 7.8*  --   --   HCT 26.7*   < > 23.4*  --  24.5* 24.2*  --   --   PLT 218  --  261  --  284 306  --   --   HEPARINUNFRC 0.25*   < > 0.63   < >  --  0.87* 0.84* 0.48  CREATININE 0.51  --  0.64  --   --  0.58  --   --    < > = values in this interval not displayed.    Estimated Creatinine Clearance: 54.7 mL/min (by C-G formula based on SCr of 0.58 mg/dL).   Assessment: 69 yr old female with medical history significant for GERD, wt loss, esophagitis, AAA, HTN, HLD was admitted on 02/12/20 for sepsis due to aspiration PNA. Pt had recent EGD/colonoscopy and was found to have gastritis, with recent episode of hematemesis. Chest CTA showed acute PE within segmental branch of RLL pulmonary artery. Pharmacy is consulted to dose heparin for new PE.   Heparin level is now therapeutic.  Goal of Therapy:  Heparin level 0.3-0.7 units/ml Monitor platelets by anticoagulation protocol: Yes   Plan:    Continue heparin gtt 900 units/hr Daily heparin level and CBC  Monitor for s/sx of bleeding    02/14/20, PharmD, BCPS Clinical Pharmacist [phone] Please check AMION for all [facility] Pharmacy numbers 02/18/2020

## 2020-02-18 NOTE — Progress Notes (Signed)
NAME:  KESSLER KOPINSKI, MRN:  [id number], DOB:  1951-10-27, LOS: 6 ADMISSION DATE:  02/11/2020, CONSULTATION DATE:  02/11/2020 REFERRING MD:  TRH, CHIEF COMPLAINT:  Respiratory failure   Brief History   DAYLAH SAYAVONG Is a 69 y.o. woman with recent EGD for nausea, vomiting, weight loss over the last 2 years. Persistent hoarse voice after procedure. Came in from home with altered mental status and emesis. Intubated in the ED for clinical coma.   Consults:  None  Procedures:  Intubation 3/27 > 3/29 Transfer out on 3/30  Transferred back to ICU on 4/1  Significant Diagnostic Tests:  3/27 chest xray with right middle and lower lobe airspace opacities concerning for aspiration CTA chest 3/31 > acute pulmonary embolus within segmental branch of the right lower lobe. Interval development of bilateral pleural effusions L > R. Dense opacification bilaterally.  CT head 3/31 > No evidence of acute intracranial abnormality. Stable chronic small vessel ischemic disease. 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 Echo 3/29 > LVEF 65-70 %. Mild reduced RVSF. ? Of McConnells sign.   Micro Data:  MRSA PCR negative  Antimicrobials:  Vancomycin 3/27 > 3/28 Cefepime 3/27 > 3/28 Unasyn 3/28 > 3/31 Cefepime 3/31 >4/3 Flagyl 3/31 >4/2  Interim history/subjective:  On BiPAP x 7-8 hrs overnight. Tolerated well. Somewhat agitated overnight, requiring a one time dose of Ativan. This morning, patient felt she was taking shallow breaths and requested to be placed on BiPAP. She was resting comfortably when seen this AM.   Objective   Blood pressure 112/61, pulse 81, temperature 98.5 F (36.9 C), temperature source Axillary, resp. rate (!) 23, height 5\' 2"  (1.575 m), weight 55 kg, SpO2 100 %.    Vent Mode: BIPAP FiO2 (%):  [50 %] 50 % Set Rate:  [12 bmp] 12 bmp PEEP:  [6 cmH20] 6 cmH20 Pressure Support:  [6 cmH20] 6 cmH20   Intake/Output Summary (Last 24 hours) at  02/18/2020 1213 Last data filed at 02/18/2020 0900 Gross per 24 hour  Intake 1561.03 ml  Output 500 ml  Net 1061.03 ml   Filed Weights   02/16/20 0336 02/16/20 0800 02/17/20 0500  Weight: 57.2 kg 57.2 kg 55 kg    Physical Exam:  General: chronically ill appearing female resting comfortably in bed while on BiPAP  Lungs: coarse breath sounds bilaterally  CV: RRR, no mrg   Abdomen: soft, NTND, normoactive BS   Extremities: warm and well perfused without pitting edema  Neuro: arousable to verbal stimulus, oriented only to self, following simple commands, able to move all 4 extremities   Resolved Hospital Problem list   Septic shock, AKI   Assessment & Plan:   Acute hypoxemic respiratory failure: Successfully extubated 3/29 Aspiration pneumonia: Right sided Tobacco use disorder - 1ppd smoker RLE PE, McConell's sign noted on 3/27 echo - Continue supplemental O2 to maintain SpO2 90-95% with PRN BiPAP - Stop cefepime today  - Cor-trak in place for nutrition and medications - Scheduled levalbuterol and atrovent  - Continue heparin gtt for R segmental PE  - Monitor fever curve and WBC count  - PT/OT for mobilization   Possible COPD: undiagnosed, but she is a long time smoker  - Continue Inhaled bronchodilators,  levalbuterol and atrovent   - IV solumedrol 40 mg QD, last dose scheduled for tomorrow 4/4 - Will need PFTs if she is able to recover   Pulmonary embolism: R lower lobe segment. I doubt this is a  significant contributor to her symptoms. Lower extremity dopplers negative.  - Supplemental O2 as above.  - Continue heparin gtt, monitor Hgb  - No role for thrombolytic therapy of any kind   Acute metabolic encephalopathy:  - Unlikely to be EtOH withdrawal given timecourse. Could be delirium, benzodiazepine withdrawal, or metabolic derangements  - Continue Precedex for agitation to keep RASS 0 to -1; wean as tolerated  - RN neurochecks  Elevated troponin - suspect demand  ischemia. EKG with no clear ischemic change.  - Troponin peaked and began to trend down 3/31 PM  Hypernatremia - Na 149, no improvement from yesterday  - Free water deficit of 1.8L  - Increase free water to 3500cc every 4 hours  History of essential hypertension - Restart home losartan when stable to do so  Chronic emesis, gastropathy, abdominal discomfort GERD - On chronic pantoprazole 40 mg twice daily   Best practice:  Diet: cortrak for TF Pain/Anxiety/Delirium protocol (if indicated): Precedex infusion VAP protocol (if indicated): NA DVT prophylaxis: Lovenox GI prophylaxis: Pantoprazole Glucose control: N/A Mobility: PT/OT  Code Status: Full Family Communication: Update family Disposition: To ICU  Labs   CBC: Recent Labs  Lab 02/14/20 0303 02/14/20 0303 02/15/20 0239 02/15/20 0239 02/15/20 1035 02/15/20 1035 02/16/20 0350 02/17/20 0229 02/17/20 0646 02/17/20 1648 02/18/20 0322  WBC 12.0*   < > 17.4*   < > 20.7*  --  16.0*  --  18.6* 22.4* 23.0*  NEUTROABS 10.7*  --  15.2*  --   --   --  13.4*  --  17.4*  --  21.3*  HGB 7.6*   < > 8.5*   < > 9.0*   < > 8.6* 8.2* 7.6* 8.0* 7.8*  HCT 22.4*   < > 25.3*   < > 27.4*   < > 26.7* 24.0* 23.4* 24.5* 24.2*  MCV 116.7*   < > 118.2*   < > 123.4*  --  123.6*  --  121.9* 121.3* 122.2*  PLT 158   < > 189   < > 208  --  218  --  261 284 306   < > = values in this interval not displayed.    Basic Metabolic Panel: Recent Labs  Lab 02/12/20 0529 02/14/20 0303 02/15/20 0239 02/15/20 1035 02/16/20 0350 02/16/20 1136 02/16/20 1529 02/17/20 0229 02/17/20 0646 02/17/20 1648 02/18/20 0322  NA   < > 143 144  --  151*  --   --  148* 149*  --  149*  K   < > 3.1* 3.0*  --  4.7  --   --  3.7 4.1  --  3.9  CL  --  107 108  --  111  --   --   --  112*  --  112*  CO2  --  29 26  --  28  --   --   --  23  --  28  GLUCOSE  --  133* 137*  --  126*  --   --   --  147*  --  145*  BUN  --  28* 18  --  24*  --   --   --  30*  --   25*  CREATININE  --  0.57 0.52  --  0.51  --   --   --  0.64  --  0.58  CALCIUM  --  8.6* 8.8*  --  8.8*  --   --   --  8.4*  --  8.2*  MG   < >  --  1.9  --   --  2.0 2.0  --  2.2 2.1  --   PHOS   < >  --   --  2.2*  --  3.0 2.9  --  2.6 2.4*  --    < > = values in this interval not displayed.   GFR: Estimated Creatinine Clearance: 54.7 mL/min (by C-G formula based on SCr of 0.58 mg/dL). Recent Labs  Lab 02/12/20 0008 02/12/20 0529 02/12/20 0715 02/13/20 0225 02/16/20 0350 02/17/20 0646 02/17/20 1648 02/18/20 0322  PROCALCITON 20.86  --   --   --   --   --   --   --   WBC  --    < >  --    < > 16.0* 18.6* 22.4* 23.0*  LATICACIDVEN 2.3*  --  1.7  --   --   --   --   --    < > = values in this interval not displayed.    Liver Function Tests: Recent Labs  Lab 02/13/20 0225 02/14/20 0303 02/16/20 0350 02/17/20 0646 02/18/20 0322  AST 68* 99* 40 30 20  ALT 27 46* 51* 44 37  ALKPHOS 45 92 94 73 77  BILITOT 1.6* 0.9 1.0 0.7 0.7  PROT 4.9* 5.1* 5.5* 5.3* 5.2*  ALBUMIN 2.0* 2.1* 2.4* 2.5* 2.4*   No results for input(s): LIPASE, AMYLASE in the last 168 hours. Recent Labs  Lab 02/17/20 0646  AMMONIA 43*    ABG    Component Value Date/Time   PHART 7.419 02/17/2020 0229   PCO2ART 41.9 02/17/2020 0229   PO2ART 67.0 (L) 02/17/2020 0229   HCO3 27.1 02/17/2020 0229   TCO2 28 02/17/2020 0229   ACIDBASEDEF 4.0 (H) 02/12/2020 0511   O2SAT 93.0 02/17/2020 0229     Coagulation Profile: Recent Labs  Lab 02/12/20 0008  INR 1.1    Cardiac Enzymes: Recent Labs  Lab 02/12/20 0008 02/12/20 0529  CKTOTAL 1,746* 1,775*    HbA1C: No results found for: HGBA1C  CBG: Recent Labs  Lab 02/17/20 1539 02/17/20 1929 02/17/20 2337 02/18/20 0405 02/18/20 0747  GLUCAP 136* 149* 158* 150* 141*    Critical care time: N/a    Ina Homes, MD  [facility] PGY3  See Amion for personal pager PCCM on call pager [phone]  02/18/2020 12:13 PM

## 2020-02-18 NOTE — Progress Notes (Signed)
Placed patient on bipap due to increased work of breathing. Will continue to monitor patients status.

## 2020-02-18 NOTE — Progress Notes (Signed)
ANTICOAGULATION CONSULT NOTE  Pharmacy Consult for Heparin Indication: pulmonary embolus  Allergies  Allergen Reactions  . Celebrex [Celecoxib] Swelling    Throat swelling  . Sulfa Antibiotics Swelling    Throat swelling    Patient Measurements: Height: 5\' 2"  (157.5 cm) Weight: 55 kg (121 lb 4.1 oz) IBW/kg (Calculated) : 50.1 Heparin Dosing Weight: 57.3 kg  Vital Signs: Temp: 98.5 F (36.9 C) (04/03 0408) Temp Source: Axillary (04/03 0408) BP: 112/61 (04/03 0822) Pulse Rate: 81 (04/03 0822)  Labs: Recent Labs    02/15/20 1409 02/15/20 1801 02/15/20 2328 02/16/20 0350 02/16/20 1529 02/17/20 0646 02/17/20 0646 02/17/20 1648 02/18/20 0322 02/18/20 1007  HGB  --   --   --  8.6*   < > 7.6*   < > 8.0* 7.8*  --   HCT  --   --   --  26.7*   < > 23.4*  --  24.5* 24.2*  --   PLT  --   --    < > 218  --  261  --  284 306  --   HEPARINUNFRC  --   --    < > 0.25*   < > 0.63  --   --  0.87* 0.84*  CREATININE  --   --   --  0.51  --  0.64  --   --  0.58  --   TROPONINIHS 1,369* 1,211*  --   --   --   --   --   --   --   --    < > = values in this interval not displayed.    Estimated Creatinine Clearance: 54.7 mL/min (by C-G formula based on SCr of 0.58 mg/dL).   Assessment: 70 yr old female with medical history significant for GERD, wt loss, esophagitis, AAA, HTN, HLD was admitted on 02/12/20 for sepsis due to aspiration PNA. Pt had recent EGD/colonoscopy and was found to have gastritis, with recent episode of hematemesis. Chest CTA showed acute PE within segmental branch of RLL pulmonary artery. Pharmacy is consulted to dose heparin for new PE.   Heparin level is elevated and trended down slightly.  No bleeding nor issue reported.  Goal of Therapy:  Heparin level 0.3-0.7 units/ml Monitor platelets by anticoagulation protocol: Yes   Plan:    Reduce heparin gtt to 900 units/hr Check 6 hr heparin level Daily heparin level and CBC  Monitor for s/sx of bleeding   Slyvester Latona D.  12-24-1980, PharmD, BCPS, BCCCP 02/18/2020, 11:37 AM

## 2020-02-18 NOTE — Progress Notes (Signed)
Rehab Admissions Coordinator Note:  Patient was screened by Clois Dupes for appropriateness for an Inpatient Acute Rehab Consult per Therapy recs.   At this time, we are recommending Inpatient Rehab consult. Please place order for consult.  Clois Dupes 02/18/2020, 5:02 PM  I can be reached at [phone].

## 2020-02-18 NOTE — Progress Notes (Signed)
ANTICOAGULATION CONSULT NOTE - Follow Up Consult  Pharmacy Consult for heparin Indication: pulmonary embolus  Labs: Recent Labs     0000 02/15/20 1409 02/15/20 1801 02/15/20 2328 02/16/20 0350 02/16/20 1529 02/16/20 2321 02/17/20 0229 02/17/20 0646 02/17/20 0646 02/17/20 1648 02/18/20 0322  HGB   < >  --   --   --  8.6*  --   --    < > 7.6*   < > 8.0* 7.8*  HCT   < >  --   --   --  26.7*  --   --    < > 23.4*  --  24.5* 24.2*  PLT   < >  --   --   --  218  --   --   --  261  --  284 306  HEPARINUNFRC  --   --   --    < > 0.25*   < > 0.32  --  0.63  --   --  0.87*  CREATININE  --   --   --   --  0.51  --   --   --  0.64  --   --   --   TROPONINIHS  --  1,369* 1,211*  --   --   --   --   --   --   --   --   --    < > = values in this interval not displayed.    Assessment: 69yo female supratherapeutic on heparin after several levels at goal though level had been trending up; no gtt issues or signs of bleeding per RN.  Goal of Therapy:  Heparin level 0.3-0.7 units/ml   Plan:  Will decrease heparin gtt by 2 units/kg/hr to 1100 units/hr and check level in 6 hours.    Vernard Gambles, PharmD, BCPS  02/18/2020,3:58 AM

## 2020-02-18 NOTE — Evaluation (Signed)
Physical Therapy Evaluation Patient Details Name: Dawn Beasley MRN: [id number] DOB: 1951-01-21 Today's Date: 02/18/2020   History of Present Illness  Pt adm with acute hypoxemic respiratory failure due to aspiration PNA. Intubated 3/27-3/29. Transferred out of ICU 3/30 and back into ICU on 4/1 with respiratory distress and placed on bipap.    PMH - HTN, etoh abuse, opioid abuse, spinal stenosis, copd, AAA  Clinical Impression  Pt with decr mobility after illness and inactivity. Expect pt to make steady progress and may need CIR prior to return home.    Follow Up Recommendations CIR;Supervision/Assistance - 24 hour    Equipment Recommendations  Other (comment)(to be determined)    Recommendations for Other Services       Precautions / Restrictions Precautions Precautions: Fall;Other (comment) Precaution Comments: watch resp status Restrictions Weight Bearing Restrictions: No      Mobility  Bed Mobility Overal bed mobility: Needs Assistance Bed Mobility: Sit to Supine       Sit to supine: Min assist   General bed mobility comments: Assist to lower trunk and bring legs back up into bed  Transfers Overall transfer level: Needs assistance Equipment used: 4-wheeled walker Transfers: Sit to/from [employer] Sit to Stand: Min assist Stand pivot transfers: Min assist       General transfer comment: Assist to for balance and support. Verbal cues for hand placement  Ambulation/Gait Ambulation/Gait assistance: Min assist Gait Distance (Feet): 2 Feet Assistive device: 4-wheeled walker Gait Pattern/deviations: Step-to pattern;Decreased step length - right;Decreased step length - left;Shuffle Gait velocity: decr Gait velocity interpretation: <1.31 ft/sec, indicative of household ambulator General Gait Details: Assist for balance and support. Only tolerated taking a few steps to bed. Pt with HR 130's and RR high 30's.   Stairs            Wheelchair  Mobility    Modified Rankin (Stroke Patients Only)       Balance Overall balance assessment: Needs assistance Sitting-balance support: Bilateral upper extremity supported;Feet supported Sitting balance-Leahy Scale: Poor Sitting balance - Comments: UE support   Standing balance support: Bilateral upper extremity supported Standing balance-Leahy Scale: Poor Standing balance comment: UE support and min assist for static standing                             Pertinent Vitals/Pain Pain Assessment: No/denies pain    Home Living Family/patient expects to be discharged to:: Private residence Living Arrangements: Spouse/significant other Available Help at Discharge: Family;Available PRN/intermittently Type of Home: House Home Access: Stairs to enter   Entrance Stairs-Number of Steps: 2 Home Layout: Two level;Able to live on main level with bedroom/bathroom Home Equipment: Gil[employer] Mor - single point      Prior Function Level of Independence: Independent with assistive device(s)         Comments: Limited by back pain. Not real active     Hand Dominance   Dominant Hand: Right    Extremity/Trunk Assessment   Upper Extremity Assessment Upper Extremity Assessment: Defer to OT evaluation    Lower Extremity Assessment Lower Extremity Assessment: Generalized weakness       Communication   Communication: No difficulties  Cognition Arousal/Alertness: Awake/alert Behavior During Therapy: Anxious;Impulsive Overall Cognitive Status: Impaired/Different from baseline Area of Impairment: Memory;Safety/judgement;Problem solving;Following commands;Attention                   Current Attention Level: Sustained Memory: Decreased recall of precautions;Decreased short-term memory Following  Commands: Follows one step commands inconsistently;Follows one step commands with increased time Safety/Judgement: Decreased awareness of safety;Decreased awareness of deficits    Problem Solving: Requires verbal cues;Requires tactile cues        General Comments      Exercises     Assessment/Plan    PT Assessment Patient needs continued PT services  PT Problem List Decreased strength;Decreased activity tolerance;Decreased balance;Decreased mobility;Decreased cognition;Decreased safety awareness;Decreased knowledge of precautions;Cardiopulmonary status limiting activity       PT Treatment Interventions DME instruction;Gait training;Functional mobility training;Therapeutic activities;Therapeutic exercise;Balance training;Patient/family education    PT Goals (Current goals can be found in the Care Plan section)  Acute Rehab PT Goals Patient Stated Goal: return home PT Goal Formulation: With patient Time For Goal Achievement: 03/03/20 Potential to Achieve Goals: Fair    Frequency Min 3X/week   Barriers to discharge        Co-evaluation               AM-PAC PT "6 Clicks" Mobility  Outcome Measure Help needed turning from your back to your side while in a flat bed without using bedrails?: A Lot Help needed moving from lying on your back to sitting on the side of a flat bed without using bedrails?: A Lot Help needed moving to and from a bed to a chair (including a wheelchair)?: A Little Help needed standing up from a chair using your arms (e.g., wheelchair or bedside chair)?: A Little Help needed to walk in hospital room?: A Lot Help needed climbing 3-5 steps with a railing? : Total 6 Click Score: 13    End of Session Equipment Utilized During Treatment: Oxygen Activity Tolerance: Patient limited by fatigue Patient left: in bed;with call bell/phone within reach;with nursing/sitter in room;with family/visitor present Nurse Communication: Mobility status PT Visit Diagnosis: Unsteadiness on feet (R26.81);Other abnormalities of gait and mobility (R26.89);Muscle weakness (generalized) (M62.81)    Time: 5427-0623 PT Time Calculation (min)  (ACUTE ONLY): 26 min   Charges:   PT Evaluation $PT Eval Moderate Complexity: 1 Mod PT Treatments $Therapeutic Activity: 8-22 mins        [place] Pager [phone] Office [place] 02/18/2020, 4:33 PM

## 2020-02-19 LAB — GLUCOSE, CAPILLARY
Glucose-Capillary: 126 mg/dL — ABNORMAL HIGH (ref 70–99)
Glucose-Capillary: 131 mg/dL — ABNORMAL HIGH (ref 70–99)
Glucose-Capillary: 172 mg/dL — ABNORMAL HIGH (ref 70–99)
Glucose-Capillary: 84 mg/dL (ref 70–99)
Glucose-Capillary: 97 mg/dL (ref 70–99)
Glucose-Capillary: 99 mg/dL (ref 70–99)

## 2020-02-19 LAB — CBC WITH DIFFERENTIAL/PLATELET
Abs Immature Granulocytes: 0.48 10*3/uL — ABNORMAL HIGH (ref 0.00–0.07)
Basophils Absolute: 0 10*3/uL (ref 0.0–0.1)
Basophils Relative: 0 %
Eosinophils Absolute: 0 10*3/uL (ref 0.0–0.5)
Eosinophils Relative: 0 %
HCT: 23.4 % — ABNORMAL LOW (ref 36.0–46.0)
Hemoglobin: 7.4 g/dL — ABNORMAL LOW (ref 12.0–15.0)
Immature Granulocytes: 3 %
Lymphocytes Relative: 2 %
Lymphs Abs: 0.4 10*3/uL — ABNORMAL LOW (ref 0.7–4.0)
MCH: 39.4 pg — ABNORMAL HIGH (ref 26.0–34.0)
MCHC: 31.6 g/dL (ref 30.0–36.0)
MCV: 124.5 fL — ABNORMAL HIGH (ref 80.0–100.0)
Monocytes Absolute: 0.5 10*3/uL (ref 0.1–1.0)
Monocytes Relative: 3 %
Neutro Abs: 16.5 10*3/uL — ABNORMAL HIGH (ref 1.7–7.7)
Neutrophils Relative %: 92 %
Platelets: 264 10*3/uL (ref 150–400)
RBC: 1.88 MIL/uL — ABNORMAL LOW (ref 3.87–5.11)
RDW: 15.2 % (ref 11.5–15.5)
WBC: 18.1 10*3/uL — ABNORMAL HIGH (ref 4.0–10.5)
nRBC: 0.4 % — ABNORMAL HIGH (ref 0.0–0.2)

## 2020-02-19 LAB — COMPREHENSIVE METABOLIC PANEL
ALT: 28 U/L (ref 0–44)
AST: 15 U/L (ref 15–41)
Albumin: 2.1 g/dL — ABNORMAL LOW (ref 3.5–5.0)
Alkaline Phosphatase: 59 U/L (ref 38–126)
Anion gap: 8 (ref 5–15)
BUN: 17 mg/dL (ref 8–23)
CO2: 29 mmol/L (ref 22–32)
Calcium: 7.5 mg/dL — ABNORMAL LOW (ref 8.9–10.3)
Chloride: 111 mmol/L (ref 98–111)
Creatinine, Ser: 0.45 mg/dL (ref 0.44–1.00)
GFR calc Af Amer: >60 mL/min (ref >60)
GFR calc non Af Amer: >60 mL/min (ref >60)
Glucose, Bld: 136 mg/dL — ABNORMAL HIGH (ref 70–99)
Potassium: 3.7 mmol/L (ref 3.5–5.1)
Sodium: 148 mmol/L — ABNORMAL HIGH (ref 135–145)
Total Bilirubin: 0.4 mg/dL (ref 0.3–1.2)
Total Protein: 4.3 g/dL — ABNORMAL LOW (ref 6.5–8.1)

## 2020-02-19 LAB — HEPARIN LEVEL (UNFRACTIONATED): Heparin Unfractionated: 0.36 IU/mL (ref 0.30–0.70)

## 2020-02-19 MED ORDER — ALPRAZOLAM 0.5 MG PO TABS
0.5000 mg | ORAL_TABLET | Freq: Three times a day (TID) | ORAL | Status: DC | PRN
  Administered 2020-02-19 (×2): 1 mg via ORAL
  Filled 2020-02-19 (×2): qty 2

## 2020-02-19 MED ORDER — LOSARTAN POTASSIUM 50 MG PO TABS
100.0000 mg | ORAL_TABLET | Freq: Every day | ORAL | Status: DC
  Administered 2020-02-19 – 2020-02-22 (×4): 100 mg via ORAL
  Filled 2020-02-19 (×5): qty 2

## 2020-02-19 NOTE — Progress Notes (Signed)
ANTICOAGULATION CONSULT NOTE  Pharmacy Consult for Heparin Indication: pulmonary embolus  Allergies  Allergen Reactions  . Celebrex [Celecoxib] Swelling    Throat swelling  . Sulfa Antibiotics Swelling    Throat swelling    Patient Measurements: Height: 5\' 2"  (157.5 cm) Weight: 55 kg (121 lb 4.1 oz) IBW/kg (Calculated) : 50.1 Heparin Dosing Weight: 57.3 kg  Vital Signs: Temp: 99.2 F (37.3 C) (04/03 2349) Temp Source: Axillary (04/03 2349) BP: 145/74 (04/04 0700) Pulse Rate: 66 (04/04 0700)  Labs: Recent Labs    02/17/20 0646 02/17/20 0646 02/17/20 1648 02/18/20 0322 02/18/20 0322 02/18/20 1007 02/18/20 2008 02/19/20 0302  HGB 7.6*   < > 8.0* 7.8*  --   --   --  7.4*  HCT 23.4*   < > 24.5* 24.2*  --   --   --  23.4*  PLT 261   < > 284 306  --   --   --  264  HEPARINUNFRC 0.63   < >  --  0.87*   < > 0.84* 0.48 0.36  CREATININE 0.64  --   --  0.58  --   --   --  0.45   < > = values in this interval not displayed.    Estimated Creatinine Clearance: 54.7 mL/min (by C-G formula based on SCr of 0.45 mg/dL).   Assessment: 69 yr old female with medical history significant for GERD, wt loss, esophagitis, AAA, HTN, HLD was admitted on 02/12/20 for sepsis due to aspiration PNA. Pt had recent EGD/colonoscopy and was found to have gastritis, with recent episode of hematemesis. Chest CTA showed acute PE within segmental branch of RLL pulmonary artery. Pharmacy is consulted to dose heparin for new PE.   Heparin level is therapeutic.  No bleeding nor issue reported.  Goal of Therapy:  Heparin level 0.3-0.7 units/ml Monitor platelets by anticoagulation protocol: Yes   Plan:    Increase heparin gtt slightly to 950 units/hr to maintain therapeutic level Daily heparin level and CBC  Monitor for s/sx of bleeding  Lovett Coffin D. [birth year]-24-1982, PharmD, BCPS, BCCCP 02/19/2020, 7:43 AM

## 2020-02-19 NOTE — Progress Notes (Signed)
eLink Physician-Brief Progress Note Patient Name: Dawn Beasley DOB: Jun 13, 1951 MRN: [id number]   Date of Service  02/19/2020  HPI/Events of Note  Tube feeding held while on BiPap. Inquiry if water flushes may be held as well.  eICU Interventions  Na 149 Ok to hold water flushes for now but if with increased Na in next determination may need to start D5W        [name] 02/19/2020, 2:39 AM

## 2020-02-19 NOTE — Progress Notes (Signed)
NAME:  Dawn Beasley, MRN:  [id number], DOB:  July 14, 1951, LOS: 7 ADMISSION DATE:  02/11/2020, CONSULTATION DATE:  02/11/2020 REFERRING MD:  TRH, CHIEF COMPLAINT:  Respiratory failure   Brief History   Dawn Beasley Is a 69 y.o. woman with PMH of HTN, undiagnosed COPD, chronic gastric dysmotility, chronic emesis who underwent and EGD and developed a right-sided aspiration pneumonia afterwards.  She progressed to acute respiratory failure and sepsis requiring intubation and admission to the ICU.  Extubated on 3/29 and transferred to the floor but developed AMS, agitated delirium and respiratory distress and was transferred back to the ICU.  Found to have a new right subsegmental PE and start anticoagulation.  Has also been on Precedex drip for agitation and EtOH/benzo withdrawal and as needed BiPAP  Consults:  None  Procedures:  Intubation 3/27 > 3/29 Transfer out on 3/30  Transferred back to ICU on 4/1  Significant Diagnostic Tests:  3/27 chest xray with right middle and lower lobe airspace opacities concerning for aspiration CTA chest 3/31 > acute pulmonary embolus within segmental branch of the right lower lobe. Interval development of bilateral pleural effusions L > R. Dense opacification bilaterally.  CT head 3/31 > No evidence of acute intracranial abnormality. Stable chronic small vessel ischemic disease. 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 Echo 3/29 > LVEF 65-70 %. Mild reduced RVSF. ? Of McConnells sign.   Micro Data:  MRSA PCR negative  Antimicrobials:  Vancomycin 3/27 > 3/28 Cefepime 3/27 > 3/28 Unasyn 3/28 > 3/31 Cefepime 3/31 >4/3 Flagyl 3/31 >4/2  Interim history/subjective:  Tolerated nasal cannula yesterday afternoon but back on BiPAP overnight.  She was again transitioned to nasal cannula this morning and has been tolerating that well.  Has not used BiPAP today.  She was alert and awake when seen.  Did not report any  complaints. Did require 2 doses of Ativan overnight.   Objective   Blood pressure (!) 150/80, pulse 92, temperature 99 F (37.2 C), temperature source Oral, resp. rate (!) 38, height 5\' 2"  (1.575 m), weight 55 kg, SpO2 100 %.    Vent Mode: PCV FiO2 (%):  [50 %] 50 % Set Rate:  [12 bmp] 12 bmp PEEP:  [6 cmH20] 6 cmH20   Intake/Output Summary (Last 24 hours) at 02/19/2020 1507 Last data filed at 02/19/2020 1000 Gross per 24 hour  Intake 1299.02 ml  Output 1550 ml  Net -250.98 ml   Filed Weights   02/16/20 0336 02/16/20 0800 02/17/20 0500  Weight: 57.2 kg 57.2 kg 55 kg    Physical Exam:  General: Chronically ill-appearing elderly female, sleeping comfortably in bed, in no acute distress Lungs: Mild bilateral crackles CV: RRR, normal S1/S2, no MRG Abdomen: Soft, NTND, normoactive bowel sounds Extremities: Warm and well-perfused without pitting edema Neuro: Easily arousable to verbal stimulus, alert and oriented x3, following commands, moving all 4 extremities  Resolved Hospital Problem list   Septic shock, AKI   Assessment & Plan:   # Acute hypoxemic respiratory failure: Successfully extubated 3/29 # Aspiration pneumonia: Right sided, completed antibiotics  # Tobacco use disorder - 1ppd smoker # RLE PE, McConell's sign noted on 3/27 echo - Continue supplemental O2 to maintain SpO2 90-95% with PRN BiPAP - NG in place for nutrition - Scheduled levalbuterol and atrovent  - Continue heparin gtt for R segmental PE  - Monitor fever curve and WBC count  - PT/OT for mobilization   # Acute metabolic encephalopathy:  -  Unlikely to be EtOH withdrawal given timecourse. Could be agitated  delirium, benzodiazepine withdrawal, or metabolic derangements  - Somewhat improved today, fully alert and oriented  - Weaning precedex gtt as able  - Start xanax 0.5-1 mg q8h PRN  - RN neurochecks  # Possible COPD: undiagnosed, but she is a long time smoker  - Continue Inhaled bronchodilators,   levalbuterol and atrovent   - IV solumedrol 40 mg QD, last dose today - Will need outpatient PFTs  # Pulmonary embolism: R lower lobe segment. I doubt this is a significant contributor to her symptoms. Lower extremity dopplers negative.  - Supplemental O2 as above.  - Continue heparin gtt, monitor Hgb while on gtt as her baseline Hgb is poor  - No role for thrombolytic therapy of any kind - Will check if she is able to afford a DOAC and assess her compliance with medications. If so will plan to transition to one   # Elevated troponin - suspect demand ischemia. EKG with no clear ischemic change.  - Troponin peaked and began to trend down 3/31 PM  # Hypernatremia - Na 148, FWF held overnight while she was on BiPAP   - Free water deficit of 1.6L  - Continue free water flushes to 350cc every 4 hours  # History of essential hypertension - Restart home losartan today   # Chronic emesis, gastropathy, abdominal discomfort # GERD - On chronic pantoprazole 40 mg twice daily   Best practice:  Diet: cortrak for TF Pain/Anxiety/Delirium protocol (if indicated): Precedex infusion VAP protocol (if indicated): NA DVT prophylaxis: Lovenox GI prophylaxis: Pantoprazole Glucose control: N/A Mobility: PT/OT  Code Status: Full Family Communication: Updated family at bedside  Disposition: tele tomorrow   Labs   CBC: Recent Labs  Lab 02/15/20 0239 02/15/20 1035 02/16/20 0350 02/16/20 0350 02/17/20 0229 02/17/20 0646 02/17/20 1648 02/18/20 0322 02/19/20 0302  WBC 17.4*   < > 16.0*  --   --  18.6* 22.4* 23.0* 18.1*  NEUTROABS 15.2*  --  13.4*  --   --  17.4*  --  21.3* 16.5*  HGB 8.5*   < > 8.6*   < > 8.2* 7.6* 8.0* 7.8* 7.4*  HCT 25.3*   < > 26.7*   < > 24.0* 23.4* 24.5* 24.2* 23.4*  MCV 118.2*   < > 123.6*  --   --  121.9* 121.3* 122.2* 124.5*  PLT 189   < > 218  --   --  261 284 306 264   < > = values in this interval not displayed.    Basic Metabolic Panel: Recent Labs  Lab  02/15/20 0239 02/15/20 0239 02/15/20 1035 02/16/20 0350 02/16/20 1136 02/16/20 1529 02/17/20 0229 02/17/20 0646 02/17/20 1648 02/18/20 0322 02/19/20 0302  NA 144   < >  --  151*  --   --  148* 149*  --  149* 148*  K 3.0*   < >  --  4.7  --   --  3.7 4.1  --  3.9 3.7  CL 108  --   --  111  --   --   --  112*  --  112* 111  CO2 26  --   --  28  --   --   --  23  --  28 29  GLUCOSE 137*  --   --  126*  --   --   --  147*  --  145* 136*  BUN 18  --   --  24*  --   --   --  30*  --  25* 17  CREATININE 0.52  --   --  0.51  --   --   --  0.64  --  0.58 0.45  CALCIUM 8.8*  --   --  8.8*  --   --   --  8.4*  --  8.2* 7.5*  MG 1.9  --   --   --  2.0 2.0  --  2.2 2.1  --   --   PHOS  --   --  2.2*  --  3.0 2.9  --  2.6 2.4*  --   --    < > = values in this interval not displayed.   GFR: Estimated Creatinine Clearance: 54.7 mL/min (by C-G formula based on SCr of 0.45 mg/dL). Recent Labs  Lab 02/17/20 0646 02/17/20 1648 02/18/20 0322 02/19/20 0302  WBC 18.6* 22.4* 23.0* 18.1*    Liver Function Tests: Recent Labs  Lab 02/14/20 0303 02/16/20 0350 02/17/20 0646 02/18/20 0322 02/19/20 0302  AST 99* 40 30 20 15   ALT 46* 51* 44 37 28  ALKPHOS 92 94 73 77 59  BILITOT 0.9 1.0 0.7 0.7 0.4  PROT 5.1* 5.5* 5.3* 5.2* 4.3*  ALBUMIN 2.1* 2.4* 2.5* 2.4* 2.1*   No results for input(s): LIPASE, AMYLASE in the last 168 hours. Recent Labs  Lab 02/17/20 0646  AMMONIA 43*    ABG    Component Value Date/Time   PHART 7.419 02/17/2020 0229   PCO2ART 41.9 02/17/2020 0229   PO2ART 67.0 (L) 02/17/2020 0229   HCO3 27.1 02/17/2020 0229   TCO2 28 02/17/2020 0229   ACIDBASEDEF 4.0 (H) 02/12/2020 0511   O2SAT 93.0 02/17/2020 0229     Coagulation Profile: No results for input(s): INR, PROTIME in the last 168 hours.  Cardiac Enzymes: No results for input(s): CKTOTAL, CKMB, CKMBINDEX, TROPONINI in the last 168 hours.  HbA1C: No results found for: HGBA1C  CBG: Recent Labs  Lab  02/18/20 1930 02/18/20 2348 02/19/20 0500 02/19/20 0847 02/19/20 1301  GLUCAP 180* 156* 99 84 131*    Critical care time: N/a   04/20/20, MD  Internal Medicine PGY-3  See Burna Cash for personal pager PCCM on call pager [phone]  02/19/2020 3:07 PM

## 2020-02-19 NOTE — Evaluation (Signed)
Occupational Therapy Evaluation Patient Details Name: Dawn Beasley MRN: [id number] DOB: 01-27-1951 Today's Date: 02/19/2020    History of Present Illness Pt adm with acute hypoxemic respiratory failure due to aspiration PNA. Intubated 3/27-3/29. Transferred out of ICU 3/30 and back into ICU on 4/1 with respiratory distress and placed on bipap.    PMH - HTN, etoh abuse, opioid abuse, spinal stenosis, copd, AAA   Clinical Impression   PTA pt living with spouse, primarily independent for BADL and basic IADL. At time of eval, pt presents with cognitive deficits in attention, awareness, memory and problem solving that impacts BADL engagement. Pt completed supine <> sit and sit <> stand at max A level. Pt needing to sit back down after becoming incontinent of bowels. Pt required total A for peri care with mod A to roll in bed. 15L HFNC used throughout session with SpO2 sats stable. HR up to 120s with little mobility. Son present in session and supportive. Recommend CIR at d/c to continue to safely progress BADL prior to d/c home. Will continue to follow per POC listed below.     Follow Up Recommendations  CIR;Supervision/Assistance - 24 hour    Equipment Recommendations  Other (comment)(TBD)    Recommendations for Other Services       Precautions / Restrictions Precautions Precautions: Fall;Other (comment) Precaution Comments: watch resp status Restrictions Weight Bearing Restrictions: No      Mobility Bed Mobility Overal bed mobility: Needs Assistance Bed Mobility: Supine to Sit     Supine to sit: Max assist Sit to supine: Max assist   General bed mobility comments: increased time and cues to process sequencing of transfer. ultimatley needing assist at legs, trunk, and hips to scoot out to EOB  Transfers Overall transfer level: Needs assistance Equipment used: 1 person hand held assist Transfers: Sit to/from Stand Sit to Stand: Max assist         General transfer comment:  max A to rise and steady, needing to sit back down due to incontinence    Balance Overall balance assessment: Needs assistance Sitting-balance support: Bilateral upper extremity supported;Feet supported Sitting balance-Leahy Scale: Poor Sitting balance - Comments: R lateral lean, rounded shoulders. Needing increased cueing and support needed for posture Postural control: Right lateral lean Standing balance support: Bilateral upper extremity supported Standing balance-Leahy Scale: Zero                             ADL either performed or assessed with clinical judgement   ADL Overall ADL's : Needs assistance/impaired Eating/Feeding: NPO   Grooming: Minimal assistance;Sitting   Upper Body Bathing: Moderate assistance;Sitting   Lower Body Bathing: Maximal assistance;Sitting/lateral leans;Sit to/from stand   Upper Body Dressing : Moderate assistance;Sitting   Lower Body Dressing: Total assistance;Sitting/lateral leans;Sit to/from stand   Toilet Transfer: Maximal assistance;+2 for safety/equipment;Stand-pivot;BSC Toilet Transfer Details (indicate cue type and reason): max A to boost up hips and remain steady Toileting- Clothing Manipulation and Hygiene: Total assistance;Bed level Toileting - Clothing Manipulation Details (indicate cue type and reason): bed level peri care after BM in bed       General ADL Comments: able to tolerate sit <> stand before incontinent of BM And needing to lay back down to be cleaned     Vision Baseline Vision/History: Wears glasses Wears Glasses: At all times Patient Visual Report: No change from baseline       Perception     Praxis  Pertinent Vitals/Pain Pain Assessment: No/denies pain     Hand Dominance Right   Extremity/Trunk Assessment Upper Extremity Assessment Upper Extremity Assessment: Generalized weakness   Lower Extremity Assessment Lower Extremity Assessment: Generalized weakness       Communication  Communication Communication: Other (comment)(low mumbled speech)   Cognition Arousal/Alertness: Lethargic;Suspect due to medications(RN reports Xanax dose will need to be decr) Behavior During Therapy: Flat affect Overall Cognitive Status: Impaired/Different from baseline Area of Impairment: Memory;Attention;Following commands;Safety/judgement;Awareness;Problem solving                   Current Attention Level: Sustained Memory: Decreased recall of precautions;Decreased short-term memory Following Commands: Follows one step commands inconsistently;Follows one step commands with increased time Safety/Judgement: Decreased awareness of safety;Decreased awareness of deficits Awareness: Intellectual Problem Solving: Requires verbal cues;Requires tactile cues;Slow processing;Decreased initiation General Comments: pt incontinent and not aware or alerting staff. Overall needing increased time to process and respond to basic commands.   General Comments       Exercises     Shoulder Instructions      Home Living Family/patient expects to be discharged to:: Private residence Living Arrangements: Spouse/significant other Available Help at Discharge: Family;Available PRN/intermittently Type of Home: House Home Access: Stairs to enter Entrance Stairs-Number of Steps: 2   Home Layout: Two level;Able to live on main level with bedroom/bathroom Alternate Level Stairs-Number of Steps: flight Alternate Level Stairs-Rails: Left Bathroom Shower/Tub: Chief Strategy Officer: Standard     Home Equipment: Cane - single point          Prior Functioning/Environment Level of Independence: Independent with assistive device(s)        Comments: limited by back pain        OT Problem List: Decreased strength;Decreased knowledge of use of DME or AE;Decreased activity tolerance;Cardiopulmonary status limiting activity;Decreased cognition;Impaired balance (sitting and/or  standing);Decreased safety awareness      OT Treatment/Interventions: Self-care/ADL training;Therapeutic exercise;Patient/family education;Balance training;Energy conservation;Therapeutic activities;DME and/or AE instruction    OT Goals(Current goals can be found in the care plan section) Acute Rehab OT Goals Patient Stated Goal: return home OT Goal Formulation: With patient Time For Goal Achievement: 03/04/20 Potential to Achieve Goals: Good  OT Frequency: Min 2X/week   Barriers to D/C:            Co-evaluation              AM-PAC OT "6 Clicks" Daily Activity     Outcome Measure Help from another person eating meals?: Total(NPO) Help from another person taking care of personal grooming?: A Little Help from another person toileting, which includes using toliet, bedpan, or urinal?: A Lot Help from another person bathing (including washing, rinsing, drying)?: A Lot Help from another person to put on and taking off regular upper body clothing?: A Lot Help from another person to put on and taking off regular lower body clothing?: A Lot 6 Click Score: 12   End of Session Nurse Communication: Mobility status  Activity Tolerance: Patient limited by fatigue Patient left: in bed;with call bell/phone within reach;with bed alarm set  OT Visit Diagnosis: Unsteadiness on feet (R26.81);Other abnormalities of gait and mobility (R26.89);Muscle weakness (generalized) (M62.81);Other symptoms and signs involving cognitive function                Time: 4193-7902 OT Time Calculation (min): 39 min Charges:  OT General Charges $OT Visit: 1 Visit OT Evaluation $OT Eval Moderate Complexity: 1 Mod OT Treatments $Self Care/Home Management :  23-37 mins  Dalphine Handing, MSOT, OTR/L Acute Rehabilitation Services [facility] Office Number: [phone] Pager: [phone]  Dalphine Handing 02/19/2020, 6:00 PM

## 2020-02-20 ENCOUNTER — Inpatient Hospital Stay (HOSPITAL_COMMUNITY): Payer: PPO

## 2020-02-20 LAB — GLUCOSE, CAPILLARY
Glucose-Capillary: 154 mg/dL — ABNORMAL HIGH (ref 70–99)
Glucose-Capillary: 117 mg/dL — ABNORMAL HIGH (ref 70–99)
Glucose-Capillary: 142 mg/dL — ABNORMAL HIGH (ref 70–99)
Glucose-Capillary: 132 mg/dL — ABNORMAL HIGH (ref 70–99)
Glucose-Capillary: 152 mg/dL — ABNORMAL HIGH (ref 70–99)
Glucose-Capillary: 137 mg/dL — ABNORMAL HIGH (ref 70–99)

## 2020-02-20 LAB — CBC WITH DIFFERENTIAL/PLATELET
Abs Immature Granulocytes: 0.42 10*3/uL — ABNORMAL HIGH (ref 0.00–0.07)
Basophils Absolute: 0.1 10*3/uL (ref 0.0–0.1)
Basophils Relative: 0 %
Eosinophils Absolute: 0 10*3/uL (ref 0.0–0.5)
Eosinophils Relative: 0 %
HCT: 28.3 % — ABNORMAL LOW (ref 36.0–46.0)
Hemoglobin: 9 g/dL — ABNORMAL LOW (ref 12.0–15.0)
Immature Granulocytes: 2 %
Lymphocytes Relative: 2 %
Lymphs Abs: 0.5 10*3/uL — ABNORMAL LOW (ref 0.7–4.0)
MCH: 40.4 pg — ABNORMAL HIGH (ref 26.0–34.0)
MCHC: 31.8 g/dL (ref 30.0–36.0)
MCV: 126.9 fL — ABNORMAL HIGH (ref 80.0–100.0)
Monocytes Absolute: 0.7 10*3/uL (ref 0.1–1.0)
Monocytes Relative: 3 %
Neutro Abs: 23.7 10*3/uL — ABNORMAL HIGH (ref 1.7–7.7)
Neutrophils Relative %: 93 %
Platelets: 343 10*3/uL (ref 150–400)
RBC: 2.23 MIL/uL — ABNORMAL LOW (ref 3.87–5.11)
RDW: 15.5 % (ref 11.5–15.5)
WBC: 25.7 10*3/uL — ABNORMAL HIGH (ref 4.0–10.5)
nRBC: 0.2 % (ref 0.0–0.2)

## 2020-02-20 LAB — COMPREHENSIVE METABOLIC PANEL
ALT: 28 U/L (ref 0–44)
AST: 19 U/L (ref 15–41)
Albumin: 2.7 g/dL — ABNORMAL LOW (ref 3.5–5.0)
Alkaline Phosphatase: 80 U/L (ref 38–126)
Anion gap: 8 (ref 5–15)
BUN: 14 mg/dL (ref 8–23)
CO2: 34 mmol/L — ABNORMAL HIGH (ref 22–32)
Calcium: 8 mg/dL — ABNORMAL LOW (ref 8.9–10.3)
Chloride: 103 mmol/L (ref 98–111)
Creatinine, Ser: 0.46 mg/dL (ref 0.44–1.00)
GFR calc Af Amer: >60 mL/min (ref >60)
GFR calc non Af Amer: >60 mL/min (ref >60)
Glucose, Bld: 166 mg/dL — ABNORMAL HIGH (ref 70–99)
Potassium: 4.4 mmol/L (ref 3.5–5.1)
Sodium: 145 mmol/L (ref 135–145)
Total Bilirubin: 0.2 mg/dL — ABNORMAL LOW (ref 0.3–1.2)
Total Protein: 5.7 g/dL — ABNORMAL LOW (ref 6.5–8.1)

## 2020-02-20 LAB — PHOSPHORUS: Phosphorus: 2.7 mg/dL (ref 2.5–4.6)

## 2020-02-20 LAB — HEPARIN LEVEL (UNFRACTIONATED): Heparin Unfractionated: 0.42 IU/mL (ref 0.30–0.70)

## 2020-02-20 MED ORDER — OXYCODONE HCL 5 MG PO TABS
5.0000 mg | ORAL_TABLET | Freq: Four times a day (QID) | ORAL | Status: DC | PRN
  Administered 2020-02-20 – 2020-03-02 (×34): 5 mg via ORAL
  Filled 2020-02-20 (×36): qty 1

## 2020-02-20 MED ORDER — GUAIFENESIN 100 MG/5ML PO SOLN
10.0000 mL | Freq: Four times a day (QID) | ORAL | Status: DC
  Administered 2020-02-20 – 2020-03-02 (×33): 200 mg
  Filled 2020-02-20: qty 5
  Filled 2020-02-20 (×5): qty 10
  Filled 2020-02-20 (×2): qty 5
  Filled 2020-02-20 (×9): qty 10
  Filled 2020-02-20 (×3): qty 5
  Filled 2020-02-20 (×2): qty 10
  Filled 2020-02-20: qty 5
  Filled 2020-02-20: qty 10
  Filled 2020-02-20 (×2): qty 5
  Filled 2020-02-20: qty 10
  Filled 2020-02-20 (×7): qty 5
  Filled 2020-02-20 (×4): qty 10
  Filled 2020-02-20: qty 5

## 2020-02-20 MED ORDER — ALPRAZOLAM 0.5 MG PO TABS
0.5000 mg | ORAL_TABLET | Freq: Two times a day (BID) | ORAL | Status: DC | PRN
  Administered 2020-02-20 – 2020-03-02 (×15): 0.5 mg via ORAL
  Filled 2020-02-20 (×18): qty 1

## 2020-02-20 MED ORDER — OXYCODONE HCL 5 MG PO TABS
5.0000 mg | ORAL_TABLET | ORAL | Status: DC | PRN
  Administered 2020-02-20: 5 mg via ORAL
  Filled 2020-02-20: qty 1

## 2020-02-20 MED ORDER — FUROSEMIDE 10 MG/ML IJ SOLN
40.0000 mg | Freq: Four times a day (QID) | INTRAMUSCULAR | Status: AC
  Administered 2020-02-20 (×2): 40 mg via INTRAVENOUS
  Filled 2020-02-20 (×2): qty 4

## 2020-02-20 MED ORDER — GUAIFENESIN ER 600 MG PO TB12: 600.0000 mg | ORAL_TABLET | Freq: Two times a day (BID) | ORAL | Status: DC

## 2020-02-20 MED ORDER — SODIUM CHLORIDE 0.9 % IV SOLN
2.0000 g | Freq: Two times a day (BID) | INTRAVENOUS | Status: DC
  Administered 2020-02-20 – 2020-02-22 (×6): 2 g via INTRAVENOUS
  Filled 2020-02-20 (×6): qty 2

## 2020-02-20 MED ORDER — HYDRALAZINE HCL 20 MG/ML IJ SOLN
10.0000 mg | INTRAMUSCULAR | Status: DC | PRN
  Administered 2020-02-20 – 2020-02-21 (×2): 10 mg via INTRAVENOUS
  Filled 2020-02-20 (×2): qty 1

## 2020-02-20 NOTE — Progress Notes (Signed)
Patient refused treatment and CPT at this time due to stomach feeling uneasy and not feeling up to it. Vitals stable. Will continue to monitor.

## 2020-02-20 NOTE — Progress Notes (Signed)
[facility]Link Physician-Brief Progress Note Patient Name: Dawn Beasley DOB: 1951/01/09 MRN: [id number]   Date of Service  02/20/2020  HPI/Events of Note  Hypertension - BP = 213/85 with MAP = 112.   eICU Interventions  Will order:  1. Hydralazine 10 mg IV Q 4 hours PRN SBP > 170 or DBP > 100.      Intervention Category Major Interventions: Hypertension - evaluation and management  Rickesha Veracruz [place] 02/20/2020, 10:30 PM

## 2020-02-20 NOTE — TOC Benefit Eligibility Note (Signed)
Transition of Care [facility]) Benefit Eligibility Note    Patient Details  Name: Dawn Beasley MRN: [id number] Date of Birth: Jul 19, 1951   Medication/Dose: ELIQUIS  2.5 MG BID   CO-PAY- $ 45.00 , ELIQUIS  5 MG BID  CO-PAY- $45.00  ELIQUIS 10 MG -NON-FORMULARY / BRILINTA  90 MG BID CO-PAY- $45.00  Covered?: Yes  Tier: 3 Drug  Prescription Coverage Preferred Pharmacy: [facility]  Spoke with Person/Company/Phone Number:: JOY   @   [employer]  RX #  [phone] OPT- 2  Co-Pay: $  45.00  Prior Approval: No  Deductible: Met  Additional Notes: XARELTO 10 MG BID CO-PAY- $ 45.00   XARELTO  15 MG BID  CO-PAY- $45.00   XARELTO  20 GM DAILY  CO-PAY- $45.00    Memory Argue Phone Number: 02/20/2020, 3:14 PM

## 2020-02-20 NOTE — Progress Notes (Signed)
ANTICOAGULATION CONSULT NOTE  Pharmacy Consult for Heparin Indication: pulmonary embolus  Allergies  Allergen Reactions  . Celebrex [Celecoxib] Swelling    Throat swelling  . Sulfa Antibiotics Swelling    Throat swelling    Patient Measurements: Height: 5\' 2"  (157.5 cm) Weight: 55 kg (121 lb 4.1 oz) IBW/kg (Calculated) : 50.1 Heparin Dosing Weight: 57.3 kg  Vital Signs: Temp: 98.2 F (36.8 C) (04/05 0351) Temp Source: Oral (04/05 0351) BP: 164/87 (04/05 0800) Pulse Rate: 111 (04/05 0800)  Labs: Recent Labs    02/18/20 0322 02/18/20 0322 02/18/20 1007 02/18/20 2008 02/19/20 0302 02/20/20 0230  HGB 7.8*   < >  --   --  7.4* 9.0*  HCT 24.2*  --   --   --  23.4* 28.3*  PLT 306  --   --   --  264 343  HEPARINUNFRC 0.87*  --    < > 0.48 0.36 0.42  CREATININE 0.58  --   --   --  0.45 0.46   < > = values in this interval not displayed.    Estimated Creatinine Clearance: 54.7 mL/min (by C-G formula based on SCr of 0.46 mg/dL).   Assessment: 69 yr old female with medical history significant for GERD, wt loss, esophagitis, AAA, HTN, HLD was admitted on 02/12/20 for sepsis due to aspiration PNA. Pt had recent EGD/colonoscopy and was found to have gastritis, with recent episode of hematemesis. Chest CTA showed acute PE within segmental branch of RLL pulmonary artery. Pharmacy is consulted to dose heparin for new PE.   Heparin level remains therapeutic.  No bleeding nor issue reported. H/H improving. Platelets stable.   Goal of Therapy:  Heparin level 0.3-0.7 units/ml Monitor platelets by anticoagulation protocol: Yes   Plan:    Continue heparin gtt slightly to 950 units/hr to maintain therapeutic level Daily heparin level and CBC  Monitor for s/sx of bleeding  02/14/20, PharmD, BCPS, BCCCP Clinical Pharmacist Please refer to [facility] for [facility] Pharmacy numbers 02/20/2020, 10:11 AM

## 2020-02-20 NOTE — Care Management (Signed)
Benefit check submitted for  1. Eliquis 2.  Brilinta 3.  Xarelto

## 2020-02-20 NOTE — Progress Notes (Signed)
Contacted E-link, spoke with Sunny Schlein RN, requesting prn HTN med.  Will continue to monitor.

## 2020-02-20 NOTE — Progress Notes (Addendum)
NAME:  Dawn Beasley, MRN:  [id number], DOB:  [birth year], LOS: 8 ADMISSION DATE:  02/11/2020, CONSULTATION DATE:  02/11/2020 REFERRING MD:  TRH, CHIEF COMPLAINT:  Respiratory failure   Brief History   Dawn Beasley Is a 69 y.o. woman with PMH of HTN, undiagnosed COPD, chronic gastric dysmotility, chronic emesis who underwent and EGD and developed a right-sided aspiration pneumonia afterwards.  She progressed to acute respiratory failure and sepsis requiring intubation and admission to the ICU.  Extubated on 3/29 and transferred to the floor but developed AMS, agitated delirium and respiratory distress and was transferred back to the ICU.  Found to have a new right subsegmental PE and start anticoagulation.  Has also been on Precedex drip for agitation and EtOH/benzo withdrawal and as needed BiPAP  Consults:  None  Procedures:  Intubation 3/27 > 3/29 Transfer out on 3/30  Transferred back to ICU on 4/1  Significant Diagnostic Tests:  3/27 chest xray with right middle and lower lobe airspace opacities concerning for aspiration CTA chest 3/31 > acute pulmonary embolus within segmental branch of the right lower lobe. Interval development of bilateral pleural effusions L > R. Dense opacification bilaterally.  CT head 3/31 > No evidence of acute intracranial abnormality. Stable chronic small vessel ischemic disease. 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 Echo 3/29 > LVEF 65-70 %. Mild reduced RVSF. ? Of McConnells sign.  CXR 4/5 > Increased L pleural effusion and basilar airspace disease. Mild improvement in diffuse airspace disease throughout the right chest. Small right effusion is unchanged. Cardiomegaly.Emphysema.   Micro Data:  MRSA PCR negative  Antimicrobials:  Vancomycin 3/27 > 3/28 Cefepime 3/27 > 3/28 Unasyn 3/28 > 3/31 Cefepime 3/31 >4/3, resumed 4/5 Flagyl 3/31 >4/2  Interim history/subjective:  Tolerated HFNC most of the day  yesterday and overnight. Seems tachypneic this morning but not complaining of shortness of breath or cough. Only complaining of back pain which is chronic.   Objective   Blood pressure (!) 160/80, pulse (!) 104, temperature 99 F (37.2 C), temperature source Oral, resp. rate (!) 34, height 5\' 2"  (1.575 m), weight 55 kg, SpO2 98 %.        Intake/Output Summary (Last 24 hours) at 02/20/2020 1426 Last data filed at 02/20/2020 1400 Gross per 24 hour  Intake 1142.79 ml  Output 950 ml  Net 192.79 ml   Filed Weights   02/16/20 0336 02/16/20 0800 02/17/20 0500  Weight: 57.2 kg 57.2 kg 55 kg    Physical Exam:  General:chronically-ill appearing female in NAD  Lungs: absent breath sounds on L base, ronchorous breath sounds bilaterally  with diminished breath sounds at R base, tachypneic on 6L HFNC but oxygenating well  CV: RRR, normal S1/S2, no MRG Abdomen: Soft, NTND, normoactive bowel sounds Extremities: Warm and well-perfused without pitting edema Neuro: A&Ox3, answering questions appropriately, moving all 4 extremities without new motor or sensory deficits   Resolved Hospital Problem list   Septic shock, AKI, elevated troponin   Assessment & Plan:   # Acute hypoxemic respiratory failure: Successfully extubated 3/29 # Aspiration pneumonia: Right sided, post EGD  # Tobacco use disorder - 1ppd smoker # RLE PE, McConell's sign noted on 3/27 echo - Thought to be multifactorial from recent aspiration event post EGD, undiagnosed COPD, recent R segmental PE, delirium in the setting of benzo/opiate/EtOH withdrawal - Abx stopped 2 days ago but repeat CXR today showed increased L pleural effusion and basilar airspace disease with mild improvement  in diffuse airspace disease throughout the [place]. Small R effusion is unchanged. - POCUS showed 0.5-1cm of fluid in R lung base with B-lines  - WBC also trending up  - We will repeat respiratory cx and resume cefepime  - Continue supplemental O2 to  maintain SpO2 90-95% with PRN BiPAP - Cortrak in place for nutrition and medications  - Scheduled levalbuterol and atrovent; robitussin  - Chest physiotherapy  - Continue heparin gtt for R segmental PE  - Monitor fever curve and WBC count  - PT/OT for mobilization   # Acute metabolic encephalopathy:  - Unlikely to be EtOH withdrawal given timecourse. Could be agitated delirium, benzodiazepine withdrawal, or metabolic derangements  - Appears improved today after resuming home Xanax and opiates  - Continue Xanax 0.5 mg BID PRN  - Delirium precautions   # Possible COPD: undiagnosed, but she is a long time smoker  - Continue Inhaled bronchodilators,  levalbuterol and atrovent   - S/p 5 days of IV solumedrol  - Will need outpatient PFTs  # Pulmonary embolism: R lower lobe segment. I doubt this is a significant contributor to her symptoms. Lower extremity dopplers negative.  - Supplemental O2 as above  - Continue heparin gtt, monitor Hgb while on gtt as her baseline Hgb is poor  - No role for thrombolytic therapy of any kind - Will check if she is able to afford a DOAC and assess her compliance with medications. If so will plan to transition to one   # Hypernatremia - Resolved, Na 145  - STOP free water flushes   # Essential hypertension - Continue home losartan 100 mg today - Has normal renal function and no electrolytes abnormalities, will continue to monitor   # Chronic emesis, gastropathy, abdominal discomfort # GERD - On chronic pantoprazole 40 mg twice daily   Best practice:  Diet: cortrak for TF Pain/Anxiety/Delirium protocol (if indicated): home Xanax and oxycodone  VAP protocol (if indicated): NA DVT prophylaxis: heparin gtt  GI prophylaxis: Pantoprazole Glucose control: N/A Mobility: PT/OT  Code Status: Full Family Communication: Updated family at bedside  Disposition: ICU    Labs   CBC: Recent Labs  Lab 02/16/20 0350 02/17/20 0229 02/17/20 0646  02/17/20 1648 02/18/20 0322 02/19/20 0302 02/20/20 0230  WBC 16.0*  --  18.6* 22.4* 23.0* 18.1* 25.7*  NEUTROABS 13.4*  --  17.4*  --  21.3* 16.5* 23.7*  HGB 8.6*   < > 7.6* 8.0* 7.8* 7.4* 9.0*  HCT 26.7*   < > 23.4* 24.5* 24.2* 23.4* 28.3*  MCV 123.6*  --  121.9* 121.3* 122.2* 124.5* 126.9*  PLT 218  --  261 284 306 264 343   < > = values in this interval not displayed.    Basic Metabolic Panel: Recent Labs  Lab 02/15/20 0239 02/15/20 1035 02/16/20 0350 02/16/20 0350 02/16/20 1136 02/16/20 1529 02/17/20 0229 02/17/20 0646 02/17/20 1648 02/18/20 0322 02/19/20 0302 02/20/20 0230  NA 144  --  151*   < >  --   --  148* 149*  --  149* 148* 145  K 3.0*  --  4.7   < >  --   --  3.7 4.1  --  3.9 3.7 4.4  CL 108  --  111  --   --   --   --  112*  --  112* 111 103  CO2 26  --  28  --   --   --   --  23  --  28 29 34*  GLUCOSE 137*  --  126*  --   --   --   --  147*  --  145* 136* 166*  BUN 18  --  24*  --   --   --   --  30*  --  25* 17 14  CREATININE 0.52  --  0.51  --   --   --   --  0.64  --  0.58 0.45 0.46  CALCIUM 8.8*  --  8.8*  --   --   --   --  8.4*  --  8.2* 7.5* 8.0*  MG 1.9  --   --   --  2.0 2.0  --  2.2 2.1  --   --   --   PHOS  --    < >  --   --  3.0 2.9  --  2.6 2.4*  --   --  2.7   < > = values in this interval not displayed.   GFR: Estimated Creatinine Clearance: 54.7 mL/min (by C-G formula based on SCr of 0.46 mg/dL). Recent Labs  Lab 02/17/20 1648 02/18/20 0322 02/19/20 0302 02/20/20 0230  WBC 22.4* 23.0* 18.1* 25.7*    Liver Function Tests: Recent Labs  Lab 02/16/20 0350 02/17/20 0646 02/18/20 0322 02/19/20 0302 02/20/20 0230  AST 40 30 20 15 19   ALT 51* 44 37 28 28  ALKPHOS 94 73 77 59 80  BILITOT 1.0 0.7 0.7 0.4 0.2*  PROT 5.5* 5.3* 5.2* 4.3* 5.7*  ALBUMIN 2.4* 2.5* 2.4* 2.1* 2.7*   No results for input(s): LIPASE, AMYLASE in the last 168 hours. Recent Labs  Lab 02/17/20 0646  AMMONIA 43*    ABG    Component Value Date/Time    PHART 7.419 02/17/2020 0229   PCO2ART 41.9 02/17/2020 0229   PO2ART 67.0 (L) 02/17/2020 0229   HCO3 27.1 02/17/2020 0229   TCO2 28 02/17/2020 0229   ACIDBASEDEF 4.0 (H) 02/12/2020 0511   O2SAT 93.0 02/17/2020 0229     Coagulation Profile: No results for input(s): INR, PROTIME in the last 168 hours.  Cardiac Enzymes: No results for input(s): CKTOTAL, CKMB, CKMBINDEX, TROPONINI in the last 168 hours.  HbA1C: No results found for: HGBA1C  CBG: Recent Labs  Lab 02/19/20 1948 02/19/20 2352 02/20/20 0349 02/20/20 0733 02/20/20 1149  GLUCAP 126* 172* 154* 117* 142*    Critical care time: N/a   04/21/20, MD  Internal Medicine PGY-3  See Amion for personal pager PCCM on call pager [phone]  02/20/2020 2:26 PM    Pulmonary critical care attending:  This is a 69 year old female with past medical history of hypertension, likely COPD, chronic gastric dysmotility issues, recurrent emesis underwent EGD had a large right-sided aspiration pneumonia event following.  Also concerned of possible alcohol use.  She developed delirium and respiratory distress on the floor was transferred to the ICU.  Found to have a new right-sided subsegmental PE with an echocardiogram with question McConnell sign.  Additionally chest imaging with bilateral pleural effusion.  BP (!) 160/80   Pulse (!) 104   Temp 99 F (37.2 C) (Oral)   Resp (!) 34   Ht 5\' 2"  (1.575 m)   Wt 55 kg   SpO2 98%   BMI 22.18 kg/m   General: Elderly female sitting up in bed HEENT: Tracking appropriately, NCAT, core track in place Heart: Regular rate rhythm S1-S2 Lungs: Diminished basilar breath sounds, some rhonchi in  the right worse than the left.  Labs: Reviewed serum creatinine stable Chest x-ray: Bilateral small pleural effusion.  Bedside ultrasound with free-flowing small amount of effusion on right sided chest  Assessment: Acute hypoxemic respiratory failure was on mechanical support  extubated on 02/13/2020. Acute right-sided aspiration pneumonia Right-sided subsegmental PE, echocardiogram with concern of possible McConnell sign however this would not make sense in the setting of a small segmental PE. Acute metabolic encephalopathy, history of benzodiazepine and alcohol use, possibly ICU delirium related. Possible COPD Chronic emesis and gastrointestinal dysmotility Reflux  Plan: With increasing white blood cell count and persistent infiltrates on chest x-ray we will restart patient's antimicrobials. We will also reculture her sputum. May need to consider tapping of the effusions. There is only a small amount in the right side. Chest x-ray shows increasing amount in the left. She does have a significant positive cumulative fluid balance of 10 L. I think we should start with effective diuresis. Continue heparin infusion SSI with CBGs. Up out of bed to chair as much as tolerated We appreciate input from PT. If patient has persistent non-resolving pna may need to consider bronchoscopy at some point  Josephine Igo, DO [facility] 02/20/2020 4:01 PM

## 2020-02-20 NOTE — Progress Notes (Signed)
Physical Therapy Treatment Patient Details Name: Dawn Beasley MRN: [id number] DOB: Jan 24, 1951 Today's Date: 02/20/2020    History of Present Illness Pt adm with acute hypoxemic respiratory failure due to aspiration PNA. Intubated 3/27-3/29. Transferred out of ICU 3/30 and back into ICU on 4/1 with respiratory distress and placed on bipap.    PMH - HTN, etoh abuse, opioid abuse, spinal stenosis, copd, AAA    PT Comments    Pt making slow progress. Fatigues quickly. Pt incontinent of BM and unaware.   Follow Up Recommendations  CIR;Supervision/Assistance - 24 hour     Equipment Recommendations  Other (comment)(to be determined)    Recommendations for Other Services       Precautions / Restrictions Precautions Precautions: Fall;Other (comment) Precaution Comments: watch resp status Restrictions Weight Bearing Restrictions: No    Mobility  Bed Mobility Overal bed mobility: Needs Assistance Bed Mobility: Rolling;Supine to Sit Rolling: Mod assist   Supine to sit: Mod assist;+2 for safety/equipment;HOB elevated     General bed mobility comments: Assist to bring legs off of bed, elevate trunk into sitting and bring hips to EOB.   Transfers Overall transfer level: Needs assistance Equipment used: 4-wheeled walker Transfers: Sit to/from [employer] Sit to Stand: Min assist;+2 physical assistance         General transfer comment: Assist to bring hips up and for balance.   Ambulation/Gait Ambulation/Gait assistance: Min assist;+2 safety/equipment Gait Distance (Feet): 6 Feet Assistive device: 4-wheeled walker Gait Pattern/deviations: Step-to pattern;Decreased step length - right;Decreased step length - left;Shuffle Gait velocity: decr Gait velocity interpretation: <1.31 ft/sec, indicative of household ambulator General Gait Details: Assist for balance and support. Pt on 10L HFNC. HR 120's.    Stairs             Wheelchair Mobility     Modified Rankin (Stroke Patients Only)       Balance Overall balance assessment: Needs assistance Sitting-balance support: Bilateral upper extremity supported;Feet supported Sitting balance-Leahy Scale: Poor Sitting balance - Comments: UE support and min guard for static standing   Standing balance support: Bilateral upper extremity supported Standing balance-Leahy Scale: Poor Standing balance comment: UE support and min assist for static standing                            Cognition Arousal/Alertness: Awake/alert Behavior During Therapy: Anxious Overall Cognitive Status: Impaired/Different from baseline Area of Impairment: Memory;Safety/judgement;Problem solving;Following commands;Attention                   Current Attention Level: Sustained Memory: Decreased recall of precautions;Decreased short-term memory Following Commands: Follows one step commands inconsistently;Follows one step commands with increased time Safety/Judgement: Decreased awareness of safety;Decreased awareness of deficits   Problem Solving: Requires verbal cues;Requires tactile cues;Slow processing;Decreased initiation        Exercises      General Comments        Pertinent Vitals/Pain Pain Assessment: No/denies pain Faces Pain Scale: Hurts a little bit Pain Intervention(s): Monitored during session    Home Living                      Prior Function            PT Goals (current goals can now be found in the care plan section) Acute Rehab PT Goals Patient Stated Goal: return home Progress towards PT goals: Progressing toward goals    Frequency  Min 3X/week      PT Plan Current plan remains appropriate    Co-evaluation              AM-PAC PT "6 Clicks" Mobility   Outcome Measure  Help needed turning from your back to your side while in a flat bed without using bedrails?: A Lot Help needed moving from lying on your back to sitting on the  side of a flat bed without using bedrails?: A Lot Help needed moving to and from a bed to a chair (including a wheelchair)?: A Little Help needed standing up from a chair using your arms (e.g., wheelchair or bedside chair)?: A Little Help needed to walk in hospital room?: A Lot Help needed climbing 3-5 steps with a railing? : Total 6 Click Score: 13    End of Session Equipment Utilized During Treatment: Oxygen Activity Tolerance: Patient limited by fatigue Patient left: with call bell/phone within reach;in chair;with chair alarm set Nurse Communication: Mobility status;Other (comment)(replace purewick) PT Visit Diagnosis: Unsteadiness on feet (R26.81);Other abnormalities of gait and mobility (R26.89);Muscle weakness (generalized) (M62.81)     Time: 4696-2952 PT Time Calculation (min) (ACUTE ONLY): 35 min  Charges:  $Therapeutic Activity: 23-37 mins                     [place] Pager [phone] Office [place] 02/20/2020, 10:42 AM

## 2020-02-20 NOTE — Progress Notes (Signed)
  Speech Language Pathology Treatment: Dysphagia  Patient Details Name: Dawn Beasley MRN: [id number] DOB: 04-26-1951 Today's Date: 02/20/2020 Time: 6270-3500 SLP Time Calculation (min) (ACUTE ONLY): 19 min  Assessment / Plan / Recommendation Clinical Impression  Treatment focused on PO consumption to recommend return to food/liquids if appropriate. Repositioned and pt mildly dyspneic which increased mildy after sips nectar juice and bites applesauce. Subjectively swallow appeared to initiate timely without s/s aspiration or significant increased in vitals. Pt was initiated on nectar thick clear liquids (due to vomiting) during BSE d/t suspicion of airway intrusion with thin. Did not attempt thin presently- will hold off until respiratory issues improved. Pt educated/reminded to take rest breaks when shortness of breath increases and sit upright. Pt able to initiate solids per RN from GI standpoint. Initiated order for Dys 2, nectar thick liquids, pills whole in puree.      HPI HPI: 69 yo female adm to [facility] with respiratory issues requiring intubation - Recent endoscopy 02/06/2020 showing esophagitis.  She reports post-endoscopy severe vomiting = dark colored emesis.  [name] due to frequent issues with vomiting and weight per her report.  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  Imaging studies show severe right multifocal infiltrates c/w pna.   Pt denies issues with swallowing but admits to vomiting and weight loss. Pt developed altered mental status, intermittent agitation/lethargy, evolving respiratory failure requiring BiPAP and transferred to ICU. Now off Bipap and on HFNC 4.      SLP Plan  Continue with current plan of care       Recommendations  Diet recommendations: Dysphagia 2 (fine chop);Nectar-thick liquid Liquids provided via: Cup Medication Administration: Whole meds with puree Supervision: Patient able to self feed Compensations: Slow  rate;Small sips/bites;Other (Comment)(rest breaks) Postural Changes and/or Swallow Maneuvers: Seated upright 90 degrees                Oral Care Recommendations: Oral care BID Follow up Recommendations: Other (comment) SLP Visit Diagnosis: Dysphagia, unspecified (R13.10) Plan: Continue with current plan of care       GO                [name] 02/20/2020, 9:50 AM  Breck Coons Lonell Face.Ed Nurse, children's [phone] Office [phone]

## 2020-02-20 NOTE — Progress Notes (Signed)
Pt NTS'd and resp sample procured, tolerated well w/o decompensation.

## 2020-02-21 ENCOUNTER — Inpatient Hospital Stay (HOSPITAL_COMMUNITY): Payer: PPO

## 2020-02-21 DIAGNOSIS — R197 Diarrhea, unspecified: Secondary | ICD-10-CM

## 2020-02-21 LAB — CBC WITH DIFFERENTIAL/PLATELET
Abs Immature Granulocytes: 0 10*3/uL (ref 0.00–0.07)
Basophils Absolute: 0 10*3/uL (ref 0.0–0.1)
Basophils Relative: 0 %
Eosinophils Absolute: 0 10*3/uL (ref 0.0–0.5)
Eosinophils Relative: 0 %
HCT: 33.6 % — ABNORMAL LOW (ref 36.0–46.0)
Hemoglobin: 10.9 g/dL — ABNORMAL LOW (ref 12.0–15.0)
Lymphocytes Relative: 1 %
Lymphs Abs: 0.3 10*3/uL — ABNORMAL LOW (ref 0.7–4.0)
MCH: 39.5 pg — ABNORMAL HIGH (ref 26.0–34.0)
MCHC: 32.4 g/dL (ref 30.0–36.0)
MCV: 121.7 fL — ABNORMAL HIGH (ref 80.0–100.0)
Monocytes Absolute: 0.3 10*3/uL (ref 0.1–1.0)
Monocytes Relative: 1 %
Neutro Abs: 32.5 10*3/uL — ABNORMAL HIGH (ref 1.7–7.7)
Neutrophils Relative %: 98 %
Platelets: 406 10*3/uL — ABNORMAL HIGH (ref 150–400)
RBC: 2.76 MIL/uL — ABNORMAL LOW (ref 3.87–5.11)
RDW: 15 % (ref 11.5–15.5)
WBC: 33.2 10*3/uL — ABNORMAL HIGH (ref 4.0–10.5)
nRBC: 0 /100 WBC
nRBC: 0.1 % (ref 0.0–0.2)

## 2020-02-21 LAB — URINALYSIS, ROUTINE W REFLEX MICROSCOPIC
Bilirubin Urine: NEGATIVE
Glucose, UA: 50 mg/dL — AB
Ketones, ur: NEGATIVE mg/dL
Nitrite: NEGATIVE
Protein, ur: 30 mg/dL — AB
RBC / HPF: >50 RBC/hpf — ABNORMAL HIGH (ref 0–5)
Specific Gravity, Urine: 1.009 (ref 1.005–1.030)
WBC, UA: >50 WBC/hpf — ABNORMAL HIGH (ref 0–5)
pH: 8 (ref 5.0–8.0)

## 2020-02-21 LAB — GLUCOSE, CAPILLARY
Glucose-Capillary: 108 mg/dL — ABNORMAL HIGH (ref 70–99)
Glucose-Capillary: 118 mg/dL — ABNORMAL HIGH (ref 70–99)
Glucose-Capillary: 123 mg/dL — ABNORMAL HIGH (ref 70–99)
Glucose-Capillary: 126 mg/dL — ABNORMAL HIGH (ref 70–99)
Glucose-Capillary: 134 mg/dL — ABNORMAL HIGH (ref 70–99)
Glucose-Capillary: 98 mg/dL (ref 70–99)

## 2020-02-21 LAB — BASIC METABOLIC PANEL
Anion gap: 12 (ref 5–15)
BUN: 11 mg/dL (ref 8–23)
CO2: 36 mmol/L — ABNORMAL HIGH (ref 22–32)
Calcium: 8.7 mg/dL — ABNORMAL LOW (ref 8.9–10.3)
Chloride: 97 mmol/L — ABNORMAL LOW (ref 98–111)
Creatinine, Ser: 0.55 mg/dL (ref 0.44–1.00)
GFR calc Af Amer: >60 mL/min (ref >60)
GFR calc non Af Amer: >60 mL/min (ref >60)
Glucose, Bld: 138 mg/dL — ABNORMAL HIGH (ref 70–99)
Potassium: 3.6 mmol/L (ref 3.5–5.1)
Sodium: 145 mmol/L (ref 135–145)

## 2020-02-21 LAB — HEPARIN LEVEL (UNFRACTIONATED): Heparin Unfractionated: 0.37 IU/mL (ref 0.30–0.70)

## 2020-02-21 MED ORDER — IOHEXOL 9 MG/ML PO SOLN
ORAL | Status: AC
  Administered 2020-02-21: 500 mL
  Filled 2020-02-21: qty 1000

## 2020-02-21 MED ORDER — HYDRALAZINE HCL 25 MG PO TABS
25.0000 mg | ORAL_TABLET | Freq: Three times a day (TID) | ORAL | Status: DC
  Administered 2020-02-21 – 2020-03-02 (×31): 25 mg via ORAL
  Filled 2020-02-21 (×32): qty 1

## 2020-02-21 MED ORDER — HYDRALAZINE HCL 20 MG/ML IJ SOLN: 10.0000 mg | Freq: Four times a day (QID) | INTRAMUSCULAR | Status: DC | PRN

## 2020-02-21 MED ORDER — POTASSIUM CHLORIDE 20 MEQ/15ML (10%) PO SOLN
40.0000 meq | Freq: Four times a day (QID) | ORAL | Status: AC
  Administered 2020-02-21 (×2): 40 meq
  Filled 2020-02-21 (×2): qty 30

## 2020-02-21 MED ORDER — FAMOTIDINE 20 MG PO TABS
20.0000 mg | ORAL_TABLET | Freq: Two times a day (BID) | ORAL | Status: DC
  Administered 2020-02-21 – 2020-02-22 (×3): 20 mg
  Filled 2020-02-21 (×3): qty 1

## 2020-02-21 MED ORDER — POTASSIUM CHLORIDE CRYS ER 20 MEQ PO TBCR
40.0000 meq | EXTENDED_RELEASE_TABLET | Freq: Four times a day (QID) | ORAL | Status: DC
  Filled 2020-02-21: qty 2

## 2020-02-21 MED ORDER — FUROSEMIDE 10 MG/ML IJ SOLN
40.0000 mg | Freq: Four times a day (QID) | INTRAMUSCULAR | Status: AC
  Administered 2020-02-21 (×2): 40 mg via INTRAVENOUS
  Filled 2020-02-21 (×2): qty 4

## 2020-02-21 MED ORDER — VANCOMYCIN 50 MG/ML ORAL SOLUTION
125.0000 mg | Freq: Four times a day (QID) | ORAL | Status: DC
  Administered 2020-02-21 (×3): 125 mg via ORAL
  Filled 2020-02-21 (×6): qty 2.5

## 2020-02-21 NOTE — Progress Notes (Addendum)
NAME:  Dawn Beasley, MRN:  [id number], DOB:  23-Oct-1951, LOS: 9 ADMISSION DATE:  02/11/2020, CONSULTATION DATE:  02/11/2020 REFERRING MD:  TRH, CHIEF COMPLAINT:  Respiratory failure   Brief History   Dawn Beasley Is a 69 y.o. woman with PMH of HTN, undiagnosed COPD, chronic gastric dysmotility, chronic emesis who underwent and EGD and developed a right-sided aspiration pneumonia afterwards.  She progressed to acute respiratory failure and sepsis requiring intubation and admission to the ICU.  Extubated on 3/29 and transferred to the floor but developed AMS, agitated delirium and respiratory distress and was transferred back to the ICU.  Found to have a new right subsegmental PE and start anticoagulation.  Has also been on Precedex drip for agitation and EtOH/benzo withdrawal and as needed BiPAP  Consults:  None  Procedures:  Intubation 3/27 > 3/29 Transfer out on 3/30  Transferred back to ICU on 4/1  Significant Diagnostic Tests:  3/27 chest xray with right middle and lower lobe airspace opacities concerning for aspiration CTA chest 3/31 > acute pulmonary embolus within segmental branch of the right lower lobe. Interval development of bilateral pleural effusions L > R. Dense opacification bilaterally.  CT head 3/31 > No evidence of acute intracranial abnormality. Stable chronic small vessel ischemic disease. 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 Echo 3/29 > LVEF 65-70 %. Mild reduced RVSF. ? Of McConnells sign.  CXR 4/5 > Increased L pleural effusion and basilar airspace disease. Mild improvement in diffuse airspace disease throughout the right chest. Small right effusion is unchanged. Cardiomegaly.Emphysema.   Micro Data:  MRSA PCR negative 3/28 RVP negative  3/28 Bcx negative  4/5 Respiratory cx> negative gram stain    Antimicrobials:  Vancomycin 3/27 > 3/28 Cefepime 3/27 > 3/28 Unasyn 3/28 > 3/31 Flagyl 3/31 >4/2 Cefepime 3/31 >4/3,  resumed 4/5  Interim history/subjective:  Hypertensive overnight with max BP 213/85. IV hydralazine 10 mg q4h PRN started. Tolerating HFNC x 24 hours, currently at 8 L. Did not sleep well last night, otherwise no acute complaints.   Objective   Blood pressure (!) 171/75, pulse (!) 125, temperature 98.2 F (36.8 C), temperature source Oral, resp. rate (!) 42, height 5\' 2"  (1.575 m), weight 55 kg, SpO2 96 %.        Intake/Output Summary (Last 24 hours) at 02/21/2020 04/22/2020 Last data filed at 02/21/2020 0500 Gross per 24 hour  Intake 968.82 ml  Output 3250 ml  Net -2281.18 ml   Filed Weights   02/16/20 0336 02/16/20 0800 02/17/20 0500  Weight: 57.2 kg 57.2 kg 55 kg    Physical Exam:  General: chronically ill appearing female, awake and alert, in NAD  Lungs: absent breath sounds at L base, ronchorous bilaterally on upper lung fields  CV: tachycardic, nl S1/S2, no JVD  Abdomen: abdomen is distended and tender to palpation especially in LLQ, normoactive bowel sounds  Extremities: warm and well perfused, bilateral hands are edematous, 1+ LE pitting edema  Neuro: A&Ox3, answers questions appropriately and follows commands   Resolved Hospital Problem list   Septic shock, AKI, elevated troponin, possible COPD exacerbation, hypernatremia, acute metabolic encephalopathy, delirium   Assessment & Plan:   # Acute hypoxemic respiratory failure: multifactorial from R aspiration pneumonia post-EGD, RLE segmental PE, COPD, benzo withdrawal, and agitation delirium  - Successfully extubated 3/29, continue supplemental O2 to maintain SpO2 90-95% with PRN BiPAP - CXR yesterday showed increased L pleural effusion and basilar airspace disease with mild improvement  in diffuse airspace disease throughout the [place]. Small R effusion is unchanged. - WBC continues to trend up   - Respiratory culture with negative gram stain   - Will continue cefepime  - Consider diagnostic thoracentesis of L pleural effusion  if no improvement  - Cortrak in place for nutrition and medications  - Scheduled levalbuterol and atrovent; robitussin  - Chest physiotherapy  - Continue heparin gtt for R segmental PE  - Monitor fever curve and WBC count  - PT/OT for mobilization   # Acute metabolic encephalopathy:  - Unlikely to be EtOH withdrawal given timecourse. Could be agitated delirium, benzodiazepine withdrawal, or metabolic derangements  - Remains A&Ox3, will continue Xanax 0.5 mg BID PRN  - Delirium precautions   # Pulmonary embolism: R lower lobe segment. I doubt this is a significant contributor to her symptoms. Lower extremity dopplers negative.   - McConell's sign noted on 3/27 echo - Supplemental O2 as above  - Continue heparin gtt, monitor Hgb while on gtt as her baseline Hgb is poor  - No role for thrombolytic therapy of any kind - Will check if she is able to afford a DOAC and assess her compliance with medications. If so will plan to transition to one   # Diarrhea - Patient with 5 watery bowel movements overnight and distended + tender on abdominal exam. Also on chronic PPI. Will therefore check C diff and order CT abdomen/pelvis w/ PO contrast  - Switch Protonix to famotidine  - Could also be a side effect of antibiotics which were restarted yesterday  - Not on BM regimen, will hold off for now    # Essential hypertension - BP up to the 200s overnight and persistently elevated despite resumption of home losartan - Start hydralazine 25 mg TID  - Continue home losartan 100 mg today - Has normal renal function and no electrolytes abnormalities, will continue to monitor  - IV hydralazine 10 mg q6h PRN for sBP > 180   # Chronic emesis, gastropathy, abdominal discomfort # GERD - On chronic pantoprazole 40 mg twice daily   Best practice:  Diet: cortrak for TF Pain/Anxiety/Delirium protocol (if indicated): home Xanax and oxycodone  VAP protocol (if indicated): NA DVT prophylaxis: heparin gtt    GI prophylaxis: famotidine  Glucose control: N/A Mobility: PT/OT  Code Status: Full Family Communication: Updated family at bedside  Disposition: ICU    Labs   CBC: Recent Labs  Lab 02/17/20 0646 02/17/20 0646 02/17/20 1648 02/18/20 0322 02/19/20 0302 02/20/20 0230 02/21/20 0257  WBC 18.6*   < > 22.4* 23.0* 18.1* 25.7* 33.2*  NEUTROABS 17.4*  --   --  21.3* 16.5* 23.7* 32.5*  HGB 7.6*   < > 8.0* 7.8* 7.4* 9.0* 10.9*  HCT 23.4*   < > 24.5* 24.2* 23.4* 28.3* 33.6*  MCV 121.9*   < > 121.3* 122.2* 124.5* 126.9* 121.7*  PLT 261   < > 284 306 264 343 406*   < > = values in this interval not displayed.    Basic Metabolic Panel: Recent Labs  Lab  0000 02/15/20 0239 02/15/20 1035 02/16/20 1136 02/16/20 1529 02/17/20 0229 02/17/20 0646 02/17/20 1648 02/18/20 0322 02/19/20 0302 02/20/20 0230 02/21/20 0257  NA  --  144   < >  --   --    < > 149*  --  149* 148* 145 145  K  --  3.0*   < >  --   --    < >  4.1  --  3.9 3.7 4.4 3.6  CL   < > 108   < >  --   --   --  112*  --  112* 111 103 97*  CO2   < > 26   < >  --   --   --  23  --  28 29 34* 36*  GLUCOSE   < > 137*   < >  --   --   --  147*  --  145* 136* 166* 138*  BUN   < > 18   < >  --   --   --  30*  --  25* 17 14 11   CREATININE   < > 0.52   < >  --   --   --  0.64  --  0.58 0.45 0.46 0.55  CALCIUM   < > 8.8*   < >  --   --   --  8.4*  --  8.2* 7.5* 8.0* 8.7*  MG  --  1.9  --  2.0 2.0  --  2.2 2.1  --   --   --   --   PHOS  --   --    < > 3.0 2.9  --  2.6 2.4*  --   --  2.7  --    < > = values in this interval not displayed.   GFR: Estimated Creatinine Clearance: 54.7 mL/min (by C-G formula based on SCr of 0.55 mg/dL). Recent Labs  Lab 02/18/20 0322 02/19/20 0302 02/20/20 0230 02/21/20 0257  WBC 23.0* 18.1* 25.7* 33.2*    Liver Function Tests: Recent Labs  Lab 02/16/20 0350 02/17/20 0646 02/18/20 0322 02/19/20 0302 02/20/20 0230  AST 40 30 20 15 19   ALT 51* 44 37 28 28  ALKPHOS 94 73 77 59 80   BILITOT 1.0 0.7 0.7 0.4 0.2*  PROT 5.5* 5.3* 5.2* 4.3* 5.7*  ALBUMIN 2.4* 2.5* 2.4* 2.1* 2.7*   No results for input(s): LIPASE, AMYLASE in the last 168 hours. Recent Labs  Lab 02/17/20 0646  AMMONIA 43*    ABG    Component Value Date/Time   PHART 7.419 02/17/2020 0229   PCO2ART 41.9 02/17/2020 0229   PO2ART 67.0 (L) 02/17/2020 0229   HCO3 27.1 02/17/2020 0229   TCO2 28 02/17/2020 0229   ACIDBASEDEF 4.0 (H) 02/12/2020 0511   O2SAT 93.0 02/17/2020 0229     Coagulation Profile: No results for input(s): INR, PROTIME in the last 168 hours.  Cardiac Enzymes: No results for input(s): CKTOTAL, CKMB, CKMBINDEX, TROPONINI in the last 168 hours.  HbA1C: No results found for: HGBA1C  CBG: Recent Labs  Lab 02/20/20 1149 02/20/20 1638 02/20/20 1922 02/20/20 2310 02/21/20 0311  GLUCAP 142* 132* 152* 137* 123*    Critical care time:    Welford Roche, MD  Internal Medicine PGY-3  See Amion for personal pager PCCM on call pager [phone]  PCCM Attending:   69 yo FM, PMH HTN, COPD, chronic gastic dysmotility, recurrent emesis. S/p egd with R aspiration pna.   Overnight with increasing loose stools, increased WBC and distended abdomen   -2L UOP overnight   BP (!) 146/85   Pulse (!) 109   Temp 97.6 F (36.4 C) (Oral)   Resp (!) 29   Ht 5\' 2"  (1.575 m)   Wt 56.9 kg   SpO2 98%   BMI 22.94 kg/m   Gen: elederly fm, resting in bed  HENT: tracking appropriately, cortrack in place  Chest: RRR s1 s2  Lungs: CTAB no wheeze  Abd: distended   Labs reviewed Increasing WBC  CXR: reviewed from yesterday with increasing left effusion   A: AHRF Right sided aspiration pneumonia  Positive CFB  BL pleural effusions  Subsegmental PE Leukocytosis 33K, abdominal distension and diarrhea   P: Continue cefepime  cx pending, NGTD  C diff pending  Empiric coverage for c diff  Oral vanco started  ssi with cbgs  Remains in ICU for close obs   This patient  is critically ill with multiple organ system failure; which, requires frequent high complexity decision making, assessment, support, evaluation, and titration of therapies. This was completed through the application of advanced monitoring technologies and extensive interpretation of multiple databases. During this encounter critical care time was devoted to patient care services described in this note for 31 minutes.   Josephine Igo, DO [facility] 02/21/2020 1:35 PM

## 2020-02-21 NOTE — Progress Notes (Addendum)
ANTICOAGULATION CONSULT NOTE  Pharmacy Consult for Heparin Indication: pulmonary embolus  Allergies  Allergen Reactions  . Celebrex [Celecoxib] Swelling    Throat swelling  . Sulfa Antibiotics Swelling    Throat swelling    Patient Measurements: Height: 5\' 2"  (157.5 cm) Weight: 55 kg (121 lb 4.1 oz) IBW/kg (Calculated) : 50.1 Heparin Dosing Weight: 57.3 kg  Vital Signs: Temp: 98.2 F (36.8 C) (04/06 0352) Temp Source: Oral (04/06 0352) BP: 156/85 (04/06 0600) Pulse Rate: 124 (04/06 0600)  Labs: Recent Labs    02/19/20 0302 02/19/20 0302 02/20/20 0230 02/21/20 0257  HGB 7.4*   < > 9.0* 10.9*  HCT 23.4*  --  28.3* 33.6*  PLT 264  --  343 406*  HEPARINUNFRC 0.36  --  0.42 0.37  CREATININE 0.45  --  0.46 0.55   < > = values in this interval not displayed.    Estimated Creatinine Clearance: 54.7 mL/min (by C-G formula based on SCr of 0.55 mg/dL).   Assessment: 69 yr old female with medical history significant for GERD, wt loss, esophagitis, AAA, HTN, HLD was admitted on 02/12/20 for sepsis due to aspiration PNA. Pt had recent EGD/colonoscopy and was found to have gastritis, with recent episode of hematemesis. Chest CTA showed acute PE within segmental branch of RLL pulmonary artery. Pharmacy is consulted to dose heparin for new PE.   Heparin level remains therapeutic at 0.37.  No bleeding nor issue reported. H/H improving. Platelets stable.   Goal of Therapy:  Heparin level 0.3-0.7 units/ml Monitor platelets by anticoagulation protocol: Yes   Plan:    Continue heparin gtt 950 units/hr Daily heparin level and CBC  Monitor for s/sx of bleeding  02/14/20, PharmD PGY1 Ambulatory Care Resident [employer] # [phone]

## 2020-02-21 NOTE — Progress Notes (Signed)
  Speech Language Pathology Treatment: Dysphagia  Patient Details Name: Dawn Beasley MRN: [id number] DOB: 06/05/1951 Today's Date: 02/21/2020 Time: 3343-5686 SLP Time Calculation (min) (ACUTE ONLY): 18 min  Assessment / Plan / Recommendation Clinical Impression  Pt seen for advancement of po's with husband at bedside. Pt alert, looks good but with abdominal pain- getting scan per RN. She consumed thin liquids as trial with adequate coordination of respiration and swallow. Work of breathing was not significantly increased with water. Educated on various diet textures and pt/therapist in agreement to remain on Dys 2 to conserve energy. Her intake is small and husband states she has had problems with GI and eats small amounts "for awhile". Reviewed importance of rest breaks, upright positioning and pacing for safest intake of upgrade to thin liquid.    HPI HPI: 69 yo female adm to [facility] with respiratory issues requiring intubation - Recent endoscopy 02/06/2020 showing esophagitis.  She reports post-endoscopy severe vomiting = dark colored emesis.  [name] due to frequent issues with vomiting and weight per her report.  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  Imaging studies show severe right multifocal infiltrates c/w pna.   Pt denies issues with swallowing but admits to vomiting and weight loss. Pt developed altered mental status, intermittent agitation/lethargy, evolving respiratory failure requiring BiPAP and transferred to ICU. Now off Bipap and on HFNC 4.      SLP Plan  Continue with current plan of care       Recommendations  Diet recommendations: Dysphagia 2 (fine chop);Thin liquid Liquids provided via: Cup;Straw Medication Administration: Whole meds with puree Supervision: Patient able to self feed Compensations: Slow rate;Small sips/bites;Other (Comment) Postural Changes and/or Swallow Maneuvers: Seated upright 90 degrees                Oral Care Recommendations: Oral care BID Follow up Recommendations: None SLP Visit Diagnosis: Dysphagia, unspecified (R13.10) Plan: Continue with current plan of care       GO                [name] 02/21/2020, 3:59 PM

## 2020-02-21 NOTE — Progress Notes (Signed)
Chest therapy held at this time due to pts pain and increased HR

## 2020-02-21 NOTE — Progress Notes (Signed)
CPT held at this time due to patient sleeping. Vitals stable.

## 2020-02-22 ENCOUNTER — Inpatient Hospital Stay (HOSPITAL_COMMUNITY): Payer: PPO

## 2020-02-22 DIAGNOSIS — I2694 Multiple subsegmental pulmonary emboli without acute cor pulmonale: Secondary | ICD-10-CM

## 2020-02-22 LAB — CBC WITH DIFFERENTIAL/PLATELET
Abs Immature Granulocytes: 0.27 10*3/uL — ABNORMAL HIGH (ref 0.00–0.07)
Basophils Absolute: 0 10*3/uL (ref 0.0–0.1)
Basophils Relative: 0 %
Eosinophils Absolute: 0 10*3/uL (ref 0.0–0.5)
Eosinophils Relative: 0 %
HCT: 30 % — ABNORMAL LOW (ref 36.0–46.0)
Hemoglobin: 9.8 g/dL — ABNORMAL LOW (ref 12.0–15.0)
Immature Granulocytes: 1 %
Lymphocytes Relative: 2 %
Lymphs Abs: 0.5 10*3/uL — ABNORMAL LOW (ref 0.7–4.0)
MCH: 39.5 pg — ABNORMAL HIGH (ref 26.0–34.0)
MCHC: 32.7 g/dL (ref 30.0–36.0)
MCV: 121 fL — ABNORMAL HIGH (ref 80.0–100.0)
Monocytes Absolute: 0.6 10*3/uL (ref 0.1–1.0)
Monocytes Relative: 2 %
Neutro Abs: 28.1 10*3/uL — ABNORMAL HIGH (ref 1.7–7.7)
Neutrophils Relative %: 95 %
Platelets: 283 10*3/uL (ref 150–400)
RBC: 2.48 MIL/uL — ABNORMAL LOW (ref 3.87–5.11)
RDW: 15.7 % — ABNORMAL HIGH (ref 11.5–15.5)
WBC: 29.5 10*3/uL — ABNORMAL HIGH (ref 4.0–10.5)
nRBC: 0 % (ref 0.0–0.2)

## 2020-02-22 LAB — ECHOCARDIOGRAM LIMITED
Height: 62 in
Weight: 1936.52 oz

## 2020-02-22 LAB — BASIC METABOLIC PANEL
Anion gap: 15 (ref 5–15)
BUN: 19 mg/dL (ref 8–23)
CO2: 29 mmol/L (ref 22–32)
Calcium: 8.4 mg/dL — ABNORMAL LOW (ref 8.9–10.3)
Chloride: 99 mmol/L (ref 98–111)
Creatinine, Ser: 0.91 mg/dL (ref 0.44–1.00)
GFR calc Af Amer: >60 mL/min (ref >60)
GFR calc non Af Amer: >60 mL/min (ref >60)
Glucose, Bld: 99 mg/dL (ref 70–99)
Potassium: 4.2 mmol/L (ref 3.5–5.1)
Sodium: 143 mmol/L (ref 135–145)

## 2020-02-22 LAB — GLUCOSE, CAPILLARY
Glucose-Capillary: 87 mg/dL (ref 70–99)
Glucose-Capillary: 41 mg/dL — CL (ref 70–99)
Glucose-Capillary: 138 mg/dL — ABNORMAL HIGH (ref 70–99)
Glucose-Capillary: 118 mg/dL — ABNORMAL HIGH (ref 70–99)
Glucose-Capillary: 149 mg/dL — ABNORMAL HIGH (ref 70–99)
Glucose-Capillary: 119 mg/dL — ABNORMAL HIGH (ref 70–99)
Glucose-Capillary: 123 mg/dL — ABNORMAL HIGH (ref 70–99)

## 2020-02-22 LAB — CULTURE, RESPIRATORY W GRAM STAIN
Gram Stain: NONE SEEN
Special Requests: NORMAL

## 2020-02-22 LAB — HEPARIN LEVEL (UNFRACTIONATED): Heparin Unfractionated: 0.38 IU/mL (ref 0.30–0.70)

## 2020-02-22 MED ORDER — FAMOTIDINE 20 MG PO TABS
20.0000 mg | ORAL_TABLET | Freq: Every day | ORAL | Status: DC
  Administered 2020-02-23: 09:00:00 20 mg
  Filled 2020-02-22: qty 1

## 2020-02-22 MED ORDER — ENSURE ENLIVE PO LIQD
237.0000 mL | Freq: Three times a day (TID) | ORAL | Status: DC
  Administered 2020-02-22 – 2020-03-02 (×21): 237 mL via ORAL

## 2020-02-22 MED ORDER — OSMOLITE 1.2 CAL PO LIQD
1000.0000 mL | ORAL | Status: DC
  Administered 2020-02-22 – 2020-02-24 (×3): 1000 mL
  Filled 2020-02-22 (×3): qty 1000

## 2020-02-22 MED ORDER — DEXTROSE 50 % IV SOLN
INTRAVENOUS | Status: AC
  Filled 2020-02-22: qty 50

## 2020-02-22 MED ORDER — FUROSEMIDE 10 MG/ML IJ SOLN
40.0000 mg | Freq: Once | INTRAMUSCULAR | Status: AC
  Administered 2020-02-22: 40 mg via INTRAVENOUS
  Filled 2020-02-22: qty 4

## 2020-02-22 NOTE — Progress Notes (Signed)
Pt refused CPT due to complaints of back pain.

## 2020-02-22 NOTE — Progress Notes (Signed)
Physical Therapy Treatment Patient Details Name: Dawn Beasley MRN: [id number] DOB: Jul 16, 1951 Today's Date: 02/22/2020    History of Present Illness Pt adm with acute hypoxemic respiratory failure due to aspiration PNA. Intubated 3/27-3/29. Transferred out of ICU 3/30 and back into ICU on 4/1 with respiratory distress and placed on bipap.    PMH - HTN, etoh abuse, opioid abuse, spinal stenosis, copd, AAA    PT Comments    Pt needed encouragement from therapist and husband to push herself "out of her comfort zone".  Emphasis on warm up exercise, transitions to EOB, sitting balance and prep for standing, transfer training and progressing gait with the RW.    Follow Up Recommendations  CIR;Supervision/Assistance - 24 hour     Equipment Recommendations  Other (comment)(TBD)    Recommendations for Other Services       Precautions / Restrictions Precautions Precautions: Fall;Other (comment) Restrictions Weight Bearing Restrictions: No    Mobility  Bed Mobility Overal bed mobility: Needs Assistance Bed Mobility: Supine to Sit     Supine to sit: Mod assist     General bed mobility comments: minor assist to bring legs off the bed after warm up exercise, and some truncal assist forward and up.  Transfers Overall transfer level: Needs assistance Equipment used: Rolling walker (2 wheeled) Transfers: Sit to/from Stand Sit to Stand: Min assist;+2 physical assistance         General transfer comment: consistent cues for hand placement (up and down) and assist to come forward with some boost.  Ambulation/Gait Ambulation/Gait assistance: Min assist;Mod assist;+2 physical assistance Gait Distance (Feet): 26 Feet(then after sitting rest and additional 24 feet.) Assistive device: Rolling walker (2 wheeled) Gait Pattern/deviations: Step-to pattern;Step-through pattern;Decreased step length - right;Decreased step length - left;Decreased stride length Gait velocity: decr Gait  velocity interpretation: <1.31 ft/sec, indicative of household ambulator General Gait Details: mildly unsteady low amplitude short steps with weak knees and more assist as she fatigued.  HR increase with activity from 110's to upper 140's bpm. otherwise vitals stable.   Stairs             Wheelchair Mobility    Modified Rankin (Stroke Patients Only)       Balance Overall balance assessment: Needs assistance   Sitting balance-Leahy Scale: Fair Sitting balance - Comments: but preferred to use UE's   Standing balance support: Bilateral upper extremity supported Standing balance-Leahy Scale: Poor Standing balance comment: UE support and min assist for static standing                            Cognition Arousal/Alertness: Awake/alert Behavior During Therapy: Anxious Overall Cognitive Status: Impaired/Different from baseline                         Following Commands: Follows one step commands with increased time;Follows one step commands inconsistently   Awareness: Intellectual          Exercises Other Exercises Other Exercises: Warm up hip/knee ROM exercise with resisted gross extension Other Exercises: bicep/tricep presses with graded resistance to warm up prior to mobility.    General Comments        Pertinent Vitals/Pain Pain Assessment: Faces Faces Pain Scale: Hurts a little bit Pain Location: abdomen Pain Intervention(s): Monitored during session    Home Living  Prior Function            PT Goals (current goals can now be found in the care plan section) Acute Rehab PT Goals Patient Stated Goal: return home PT Goal Formulation: With patient Time For Goal Achievement: 03/03/20 Potential to Achieve Goals: Fair Progress towards PT goals: Progressing toward goals    Frequency    Min 3X/week      PT Plan Current plan remains appropriate    Co-evaluation              AM-PAC PT "6  Clicks" Mobility   Outcome Measure  Help needed turning from your back to your side while in a flat bed without using bedrails?: A Lot Help needed moving from lying on your back to sitting on the side of a flat bed without using bedrails?: A Lot Help needed moving to and from a bed to a chair (including a wheelchair)?: A Little Help needed standing up from a chair using your arms (e.g., wheelchair or bedside chair)?: A Little Help needed to walk in hospital room?: A Lot Help needed climbing 3-5 steps with a railing? : Total 6 Click Score: 13    End of Session   Activity Tolerance: Patient tolerated treatment well;Patient limited by fatigue Patient left: in chair;with call bell/phone within reach;with chair alarm set;with family/visitor present Nurse Communication: Mobility status PT Visit Diagnosis: Unsteadiness on feet (R26.81);Other abnormalities of gait and mobility (R26.89);Muscle weakness (generalized) (M62.81)     Time: 8099-8338 PT Time Calculation (min) (ACUTE ONLY): 28 min  Charges:  $Gait Training: 8-22 mins $Therapeutic Activity: 8-22 mins                     02/22/2020  Ginger Carne., PT Acute Rehabilitation Services [phone]  (pager) [phone]  (office)   Tessie Fass Kearsten Ginther 02/22/2020, 1:59 PM

## 2020-02-22 NOTE — Progress Notes (Signed)
ANTICOAGULATION CONSULT NOTE  Pharmacy Consult for Heparin Indication: pulmonary embolus  Allergies  Allergen Reactions  . Celebrex [Celecoxib] Swelling    Throat swelling  . Sulfa Antibiotics Swelling    Throat swelling    Patient Measurements: Height: 5\' 2"  (157.5 cm) Weight: 54.9 kg (121 lb 0.5 oz) IBW/kg (Calculated) : 50.1 Heparin Dosing Weight: 57.3 kg  Vital Signs: Temp: 98.2 F (36.8 C) (04/07 0404) Temp Source: Axillary (04/07 0404) BP: 131/58 (04/07 0600) Pulse Rate: 123 (04/07 0600)  Labs: Recent Labs    02/20/20 0230 02/20/20 0230 02/21/20 0257 02/22/20 0556  HGB 9.0*   < > 10.9* 9.8*  HCT 28.3*  --  33.6* 30.0*  PLT 343  --  406* 283  HEPARINUNFRC 0.42  --  0.37 0.38  CREATININE 0.46  --  0.55 0.91   < > = values in this interval not displayed.    Estimated Creatinine Clearance: 48.1 mL/min (by C-G formula based on SCr of 0.91 mg/dL).   Assessment: 69 yr old female with medical history significant for GERD, wt loss, esophagitis, AAA, HTN, HLD was admitted on 02/12/20 for sepsis due to aspiration PNA. Pt had recent EGD/colonoscopy and was found to have gastritis, with recent episode of hematemesis. Chest CTA showed acute PE within segmental branch of RLL pulmonary artery. Pharmacy is consulted to dose heparin for new PE.   Heparin level remains therapeutic at 0.38.  No bleeding nor issue reported. H/H improving. Platelets stable.   Goal of Therapy:  Heparin level 0.3-0.7 units/ml Monitor platelets by anticoagulation protocol: Yes   Plan:    Continue heparin gtt 950 units/hr Daily heparin level and CBC  Monitor for s/sx of bleeding F/u plans for transition to DOAC  02/14/20, PharmD PGY1 Ambulatory Care Resident [employer] # [phone]

## 2020-02-22 NOTE — Progress Notes (Signed)
Patient refused CPT through the bed at this time due to back pain. Attempted to have patient blow in flutter, however, patient not willing to do more than 2 blows at this time.

## 2020-02-22 NOTE — Progress Notes (Addendum)
NAME:  Dawn Beasley, MRN:  [id number], DOB:  [birth year], LOS: 27 ADMISSION DATE:  02/11/2020, CONSULTATION DATE:  02/11/2020 REFERRING MD:  TRH, CHIEF COMPLAINT:  Respiratory failure   Brief History   Dawn Beasley Is a 69 y.o. woman with PMH of HTN, undiagnosed COPD, chronic gastric dysmotility, chronic emesis who underwent and EGD and developed a right-sided aspiration pneumonia afterwards.  She progressed to acute respiratory failure and sepsis requiring intubation and admission to the ICU.  Extubated on 3/29 and transferred to the floor but developed AMS, agitated delirium and respiratory distress and was transferred back to the ICU.  Found to have a new right subsegmental PE and started on [facility]. Has also been on Precedex drip for agitation and EtOH/benzo withdrawal and as needed BiPAP.   Consults:  None  Procedures:  Intubation 3/27 > 3/29 Transfer out on 3/30  Transferred back to ICU on 4/1  Significant Diagnostic Tests:  3/27 chest xray with right middle and lower lobe airspace opacities concerning for aspiration CTA chest 3/31 > acute pulmonary embolus within segmental branch of the right lower lobe. Interval development of bilateral pleural effusions L > R. Dense opacification bilaterally.  CT head 3/31 > No evidence of acute intracranial abnormality. Stable chronic small vessel ischemic disease. 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 Echo 3/29 > LVEF 65-70 %. Mild reduced RVSF. ? Of McConnells sign.  CXR 4/5 > Increased L pleural effusion and basilar airspace disease. Mild improvement in diffuse airspace disease throughout the right chest. Small right effusion is unchanged. Cardiomegaly.Emphysema.   Micro Data:  MRSA PCR negative 3/28 RVP negative  3/28 Bcx negative  4/5 Respiratory cx> negative gram stain    Antimicrobials:  Vancomycin 3/27 > 3/28 Cefepime 3/27 > 3/28 Unasyn 3/28 > 3/31 Flagyl 3/31 >4/2 Cefepime 3/31 >4/3,  resumed 4/5>  Interim history/subjective:  I/O overnight due to urinary retention. No complaints this AM. Repeat bladder scan this AM with > 1L. I/O again with 1.5L removed.   Objective   Blood pressure (!) 131/58, pulse (!) 123, temperature 98.3 F (36.8 C), temperature source Axillary, resp. rate (!) 31, height 5\' 2"  (1.575 m), weight 54.9 kg, SpO2 91 %.        Intake/Output Summary (Last 24 hours) at 02/22/2020 0755 Last data filed at 02/22/2020 0646 Gross per 24 hour  Intake 411.85 ml  Output 3549 ml  Net -3137.15 ml   Filed Weights   02/17/20 0500 02/21/20 0500 02/22/20 0406  Weight: 55 kg 56.9 kg 54.9 kg    Physical Exam:  General: chronically ill appearing female, frail, awake, in NAD  Lungs: absent breath sounds on the R base, mild expiratory wheezes on upper fields bilaterally CV: tachycardic, nl S1/S2, no mrg  Abdomen: distended but improved from yesterday, nontender to palpation, normoactive bowel sounds  Extremities: mild pitting edema on bilateral LE and h ands  Neuro: alert and oriented to self only, able to follow commands, moving all 4 extremities   Resolved Hospital Problem list   Septic shock, AKI, elevated troponin, possible COPD exacerbation, hypernatremia, acute metabolic encephalopathy, diarrhea   Assessment & Plan:   # Acute hypoxemic respiratory failure: multifactorial from R aspiration pneumonia post-EGD, RLE segmental PE, pulmonary edema COPD, benzo withdrawal, and agitation delirium  - Successfully extubated 3/29, continue supplemental O2 to maintain SpO2 90-95% with PRN BiPAP - S/p IV Lasix 40 mg x2 yesterday. Volume status appears improved on exam, will diurese today again with  same dose  - Repeat CXR today  - WBC trending down  - Respiratory culture with negative gram stain   - Will continue cefepime - Consider diagnostic thoracentesis of L pleural effusion if no improvement  - Cortrak in place for nutrition and medications  - Scheduled  levalbuterol and atrovent; robitussin  - Chest physiotherapy, has been declining x 2 days, spoke to patient about importance of CPT  - Will switch heparin to therapeutic SQ lovenox  - Monitor fever curve and WBC count  - PT/OT for mobilization   # Acute metabolic encephalopathy:  - Unlikely to be EtOH withdrawal given timecourse. Could be agitated delirium, benzodiazepine withdrawal, or metabolic derangements  - Sundowning and restless overnight, A&O during the day - Delirium precautions   # AKI, acute post renal failure secondary to bladder outlet obstruction  - Cr increase > 0.3 in the past 48 hrs (0.4> 0.55> 0.91) - CT abdomen/pelvis showed severe urinary bladder retention and mild bilateral hydroureteronephrosis  - Bladder scan > 1L last night and this AM. I/O x 2 with 1500cc out  - Will place foley   # Pulmonary embolism: R lower lobe segment. I doubt this is a significant contributor to her symptoms. Lower extremity dopplers negative.   - McConell's sign noted on 3/27 echo. Will order limited echo today to assess RV function  - Supplemental O2 as above  - Switch therapeutic SQ lovenox as above  - No role for thrombolytic therapy of any kind - Will check if she is able to afford a DOAC and assess her compliance with medications. CM following.   # Diarrhea, resolved  - Patient with 5 watery bowel movements yesterday and distended + tender on abdominal exam - C diff checked as she has had a long hospital stay, required multiple antibiotics and is chronic PPI. This has not been collected as she has not had a BM since then   - Started on empiric tx for C diff yesterday with PO vanc, will d/c today  - TF held yesterday and she is hypoglycemic today, will resume TFs  # Essential hypertension - Improved after starting PO hydralazine 25 mg TID, will continue  - Continue home losartan 100 mg today - Has normal renal function and no electrolytes abnormalities, will continue to monitor  -  IV hydralazine 10 mg q6h PRN for sBP > 180   # Chronic emesis, gastropathy, abdominal discomfort # GERD - On chronic pantoprazole 40 mg twice daily   Best practice:  Diet: cortrak for TF Pain/Anxiety/Delirium protocol (if indicated): home Xanax and oxycodone  VAP protocol (if indicated): NA DVT prophylaxis: SQ lovenox  GI prophylaxis: famotidine  Glucose control: N/A Mobility: PT/OT  Code Status: Full Family Communication: Updated family at bedside  Disposition: ICU    Labs   CBC: Recent Labs  Lab 02/18/20 0322 02/19/20 0302 02/20/20 0230 02/21/20 0257 02/22/20 0556  WBC 23.0* 18.1* 25.7* 33.2* 29.5*  NEUTROABS 21.3* 16.5* 23.7* 32.5* PENDING  HGB 7.8* 7.4* 9.0* 10.9* 9.8*  HCT 24.2* 23.4* 28.3* 33.6* 30.0*  MCV 122.2* 124.5* 126.9* 121.7* 121.0*  PLT 306 264 343 406* 283    Basic Metabolic Panel: Recent Labs  Lab  0000 02/16/20 1136 02/16/20 1529 02/17/20 0229 02/17/20 0646 02/17/20 0646 02/17/20 1648 02/18/20 0322 02/19/20 0302 02/20/20 0230 02/21/20 0257 02/22/20 0556  NA  --   --   --    < > 149*   < >  --  149* 148* 145 145  143  K  --   --   --    < > 4.1   < >  --  3.9 3.7 4.4 3.6 4.2  CL   < >  --   --   --  112*   < >  --  112* 111 103 97* 99  CO2   < >  --   --   --  23   < >  --  28 29 34* 36* 29  GLUCOSE   < >  --   --   --  147*   < >  --  145* 136* 166* 138* 99  BUN   < >  --   --   --  30*   < >  --  25* 17 14 11 19   CREATININE   < >  --   --   --  0.64   < >  --  0.58 0.45 0.46 0.55 0.91  CALCIUM   < >  --   --   --  8.4*   < >  --  8.2* 7.5* 8.0* 8.7* 8.4*  MG  --  2.0 2.0  --  2.2  --  2.1  --   --   --   --   --   PHOS  --  3.0 2.9  --  2.6  --  2.4*  --   --  2.7  --   --    < > = values in this interval not displayed.   GFR: Estimated Creatinine Clearance: 48.1 mL/min (by C-G formula based on SCr of 0.91 mg/dL). Recent Labs  Lab 02/19/20 0302 02/20/20 0230 02/21/20 0257 02/22/20 0556  WBC 18.1* 25.7* 33.2* 29.5*    Liver  Function Tests: Recent Labs  Lab 02/16/20 0350 02/17/20 0646 02/18/20 0322 02/19/20 0302 02/20/20 0230  AST 40 30 20 15 19   ALT 51* 44 37 28 28  ALKPHOS 94 73 77 59 80  BILITOT 1.0 0.7 0.7 0.4 0.2*  PROT 5.5* 5.3* 5.2* 4.3* 5.7*  ALBUMIN 2.4* 2.5* 2.4* 2.1* 2.7*   No results for input(s): LIPASE, AMYLASE in the last 168 hours. Recent Labs  Lab 02/17/20 0646  AMMONIA 43*    ABG    Component Value Date/Time   PHART 7.419 02/17/2020 0229   PCO2ART 41.9 02/17/2020 0229   PO2ART 67.0 (L) 02/17/2020 0229   HCO3 27.1 02/17/2020 0229   TCO2 28 02/17/2020 0229   ACIDBASEDEF 4.0 (H) 02/12/2020 0511   O2SAT 93.0 02/17/2020 0229     Coagulation Profile: No results for input(s): INR, PROTIME in the last 168 hours.  Cardiac Enzymes: No results for input(s): CKTOTAL, CKMB, CKMBINDEX, TROPONINI in the last 168 hours.  HbA1C: No results found for: HGBA1C  CBG: Recent Labs  Lab 02/21/20 1523 02/21/20 1949 02/21/20 2350 02/22/20 0402 02/22/20 0735  GLUCAP 134* 98 108* 87 41*    Critical care time:    04/23/20, MD  Internal Medicine PGY-3  See 04/23/20 for personal pager PCCM on call pager [phone]   PCCM Attending:   69 yo FM, PMH HTN, COPD, gastric motility issues, recurrent emesis, had aspiration event following EGD. Treated with Abx , cx neg.   Urinary retention. Needed I&O.   BP (!) 131/58   Pulse (!) 123   Temp 98.3 F (36.8 C) (Axillary)   Resp (!) 31   Ht 5\' 2"  (1.575 m)   Wt 54.9 kg  SpO2 91%   BMI 22.14 kg/m   Gen: resting in bed, confused  ENT: tracking appropriately, cortrack in place  Chest: RRR, s1 s2  Lungs: rhonchi in the right, diminished in the bases  Abd: soft   Labs reviewed - WBC decreasing  CXR: improved pleural effusions  A:  AHRF, on [place], improving  Right sided aspiration pneumonia  Good UOP, CFB improved, lots of unaccounted for UOP Echo with large RV, but only small segmental PE, I wonder if she has  chronic RV dysfunction  BL pleural effusions improved with diuresis   P: Repeat 2D echo Repeat diuresis  No more stools, stop c diff testing and remove precautions  Likely needs a foley ssi with cbgs Delirium precautions   Josephine Igo, DO [facility] 02/22/2020 11:21 AM

## 2020-02-22 NOTE — Progress Notes (Signed)
  Echocardiogram 2D Echocardiogram has been performed.  Tye Savoy 02/22/2020, 9:57 AM

## 2020-02-22 NOTE — Progress Notes (Signed)
Pt refused CPT at this time. Family at bedside.

## 2020-02-22 NOTE — Progress Notes (Signed)
Inpatient Diabetes Program Recommendations  AACE/ADA: New Consensus Statement on Inpatient Glycemic Control (2015)  Target Ranges:  Prepandial:   less than 140 mg/dL      Peak postprandial:   less than 180 mg/dL (1-2 hours)      Critically ill patients:  140 - 180 mg/dL   Lab Results  Component Value Date   GLUCAP 138 (H) 02/22/2020    Review of Glycemic Control Results for CARLITA, WHITCOMB (MRN [id number]) as of 02/22/2020 09:06  Ref. Range 02/21/2020 07:27 02/21/2020 11:11 02/21/2020 15:23 02/21/2020 19:49 02/21/2020 23:50 02/22/2020 04:02 02/22/2020 07:35 02/22/2020 08:32  Glucose-Capillary Latest Ref Range: 70 - 99 mg/dL 458 (H) 592 (H) 924 (H) 98 108 (H) 87 41 (LL) 138 (H)   Diabetes history: none  Current orders for Inpatient glycemic control:  Novolog 0-9 units Q4 hours  Inpatient Diabetes Program Recommendations:    Hypoglycemia this am 41. Consider reducing correction scale to "very sensitive" 0-6 units Q4 hours starting at 150 mg/dl.  Thanks,  Christena Deem RN, MSN, BC-ADM Inpatient Diabetes Coordinator Team Pager [phone] (8a-5p)

## 2020-02-22 NOTE — Progress Notes (Signed)
eLink Physician-Brief Progress Note Patient Name: Dawn Beasley DOB: 11-27-1950 MRN: [id number]   Date of Service  02/22/2020  HPI/Events of Note  Nursing request to order BMP for AM.  eICU Interventions  Will order BMP at 5 AM.      Intervention Category Major Interventions: Other:  Lenell Antu 02/22/2020, 1:39 AM

## 2020-02-22 NOTE — Progress Notes (Signed)
pts CBG 41 this AM.  Pt refusing breakfast/juice.  1 amp D50 given.  Recheck is 138.

## 2020-02-22 NOTE — Progress Notes (Signed)
Nutrition Follow-up   DOCUMENTATION CODES:   Not applicable  INTERVENTION:   Continue trickle TF via Cortrak feeding tube:  Osmolite 1.2 at 70ml/h  Provides 576 kcal, 27 gm protein, 394 ml free water daily  Add PO supplement to maximize oral intake:  Ensure Enlive po TID, each supplement provides 350 kcal and 20 grams of protein  If intake continues to be poor, recommend resume TF via Cortrak with Osmolite 1.2 at 65 ml/h.  NUTRITION DIAGNOSIS:   Inadequate oral intake related to poor appetite, nausea, acute illness as evidenced by meal completion < 25%.  Ongoing   GOAL:   Patient will meet greater than or equal to 90% of their needs  Unmet  MONITOR:   PO intake, Supplement acceptance  ASSESSMENT:   69 yo female with AMS and emesis with hypoxic respiratory failure with aspiration pneumonia, AKI, in shock . Pt with recent EGD for nausea, vomiting, weight loss over the last 2 years. PMH includes HTN, depression, anxiety  3/27-3/29: Intubated 3/30-4/01: Nectar thick clear liquids 4/06: Diet advanced to Dysphagia 2 with thin liquids by SLP  Currently on 4L HFNC Cortrak in place. Patient is receiving Osmolite 1.2 at 20 ml/h. RN resumed trickle TF this morning due to hypoglycemia and patient is not eating well. She did not eat anything at breakfast. RN to continue to encourage intake at lunch today.   Labs reviewed.  CBG's: [phone]  Medications reviewed and include IV flagyl, folic acid, lasix, novolog, MVI, thiamine.  Weight 54.9 kg today, 54.5 kg on admission I/O + 4.8 L since admit   Diet Order:   Diet Order            DIET DYS 2 Room service appropriate? Yes with Assist; Fluid consistency: Thin  Diet effective now              EDUCATION NEEDS:   Not appropriate for education at this time  Skin:  Skin Assessment: Reviewed RN Assessment  Last BM:  4/5 type 6  Height:   Ht Readings from Last 1 Encounters:  02/16/20 5\' 2"  (1.575 m)     Weight:   Wt Readings from Last 1 Encounters:  02/22/20 54.9 kg   BMI:  Body mass index is 22.14 kg/m.  Estimated Nutritional Needs:   Kcal:  1700-1900 kcal  Protein:  85-95 g  Fluid:  >/= 1.7 L    04/23/20, RD, LDN, CNSC Please refer to Amion for contact information.

## 2020-02-23 LAB — CBC WITH DIFFERENTIAL/PLATELET
Abs Immature Granulocytes: 0.14 10*3/uL — ABNORMAL HIGH (ref 0.00–0.07)
Basophils Absolute: 0 10*3/uL (ref 0.0–0.1)
Basophils Relative: 0 %
Eosinophils Absolute: 0.2 10*3/uL (ref 0.0–0.5)
Eosinophils Relative: 1 %
HCT: 25.8 % — ABNORMAL LOW (ref 36.0–46.0)
Hemoglobin: 8.3 g/dL — ABNORMAL LOW (ref 12.0–15.0)
Immature Granulocytes: 1 %
Lymphocytes Relative: 2 %
Lymphs Abs: 0.4 10*3/uL — ABNORMAL LOW (ref 0.7–4.0)
MCH: 39.5 pg — ABNORMAL HIGH (ref 26.0–34.0)
MCHC: 32.2 g/dL (ref 30.0–36.0)
MCV: 122.9 fL — ABNORMAL HIGH (ref 80.0–100.0)
Monocytes Absolute: 0.5 10*3/uL (ref 0.1–1.0)
Monocytes Relative: 3 %
Neutro Abs: 18.4 10*3/uL — ABNORMAL HIGH (ref 1.7–7.7)
Neutrophils Relative %: 93 %
Platelets: 260 10*3/uL (ref 150–400)
RBC: 2.1 MIL/uL — ABNORMAL LOW (ref 3.87–5.11)
RDW: 15.6 % — ABNORMAL HIGH (ref 11.5–15.5)
WBC: 19.7 10*3/uL — ABNORMAL HIGH (ref 4.0–10.5)
nRBC: 0 % (ref 0.0–0.2)

## 2020-02-23 LAB — BASIC METABOLIC PANEL
Anion gap: 12 (ref 5–15)
BUN: 12 mg/dL (ref 8–23)
CO2: 35 mmol/L — ABNORMAL HIGH (ref 22–32)
Calcium: 8.5 mg/dL — ABNORMAL LOW (ref 8.9–10.3)
Chloride: 98 mmol/L (ref 98–111)
Creatinine, Ser: 0.55 mg/dL (ref 0.44–1.00)
GFR calc Af Amer: >60 mL/min (ref >60)
GFR calc non Af Amer: >60 mL/min (ref >60)
Glucose, Bld: 127 mg/dL — ABNORMAL HIGH (ref 70–99)
Potassium: 3.1 mmol/L — ABNORMAL LOW (ref 3.5–5.1)
Sodium: 145 mmol/L (ref 135–145)

## 2020-02-23 LAB — GLUCOSE, CAPILLARY
Glucose-Capillary: 124 mg/dL — ABNORMAL HIGH (ref 70–99)
Glucose-Capillary: 102 mg/dL — ABNORMAL HIGH (ref 70–99)
Glucose-Capillary: 121 mg/dL — ABNORMAL HIGH (ref 70–99)
Glucose-Capillary: 104 mg/dL — ABNORMAL HIGH (ref 70–99)
Glucose-Capillary: 102 mg/dL — ABNORMAL HIGH (ref 70–99)
Glucose-Capillary: 121 mg/dL — ABNORMAL HIGH (ref 70–99)

## 2020-02-23 LAB — TSH: TSH: 1.806 u[IU]/mL (ref 0.350–4.500)

## 2020-02-23 LAB — T4, FREE: Free T4: 0.94 ng/dL (ref 0.61–1.12)

## 2020-02-23 LAB — HEPARIN LEVEL (UNFRACTIONATED): Heparin Unfractionated: 0.31 IU/mL (ref 0.30–0.70)

## 2020-02-23 MED ORDER — POTASSIUM CHLORIDE 20 MEQ/15ML (10%) PO SOLN
40.0000 meq | Freq: Two times a day (BID) | ORAL | Status: AC
  Administered 2020-02-23 (×2): 40 meq
  Filled 2020-02-23 (×2): qty 30

## 2020-02-23 MED ORDER — APIXABAN 5 MG PO TABS
5.0000 mg | ORAL_TABLET | Freq: Two times a day (BID) | ORAL | Status: DC
  Administered 2020-02-23 – 2020-03-02 (×17): 5 mg via ORAL
  Filled 2020-02-23 (×17): qty 1

## 2020-02-23 MED ORDER — BISACODYL 10 MG RE SUPP: 10.0000 mg | Freq: Every day | RECTAL | Status: DC | PRN

## 2020-02-23 MED ORDER — POLYETHYLENE GLYCOL 3350 17 G PO PACK
17.0000 g | PACK | Freq: Every day | ORAL | Status: DC
  Administered 2020-02-23 – 2020-03-02 (×3): 17 g via ORAL
  Filled 2020-02-23 (×7): qty 1

## 2020-02-23 MED ORDER — IPRATROPIUM BROMIDE 0.02 % IN SOLN
0.5000 mg | Freq: Two times a day (BID) | RESPIRATORY_TRACT | Status: DC
  Administered 2020-02-23 – 2020-02-29 (×13): 0.5 mg via RESPIRATORY_TRACT
  Filled 2020-02-23 (×14): qty 2.5

## 2020-02-23 MED ORDER — PANTOPRAZOLE SODIUM 40 MG PO PACK
40.0000 mg | PACK | Freq: Two times a day (BID) | ORAL | Status: DC
  Administered 2020-02-23 – 2020-02-27 (×9): 40 mg
  Filled 2020-02-23 (×13): qty 20

## 2020-02-23 MED ORDER — LEVALBUTEROL HCL 0.63 MG/3ML IN NEBU
0.6300 mg | INHALATION_SOLUTION | Freq: Two times a day (BID) | RESPIRATORY_TRACT | Status: DC
  Administered 2020-02-23 – 2020-02-29 (×13): 0.63 mg via RESPIRATORY_TRACT
  Filled 2020-02-23 (×14): qty 3

## 2020-02-23 MED ORDER — GERHARDT'S BUTT CREAM
TOPICAL_CREAM | Freq: Three times a day (TID) | CUTANEOUS | Status: DC
  Administered 2020-02-23: 1 via TOPICAL
  Filled 2020-02-23 (×2): qty 1

## 2020-02-23 MED ORDER — BETHANECHOL CHLORIDE 5 MG PO TABS
5.0000 mg | ORAL_TABLET | Freq: Three times a day (TID) | ORAL | Status: DC
  Administered 2020-02-23 – 2020-03-02 (×25): 5 mg via ORAL
  Filled 2020-02-23 (×29): qty 1

## 2020-02-23 NOTE — Progress Notes (Addendum)
NAME:  Dawn Beasley, MRN:  [id number], DOB:  1950/11/22, LOS: 11 ADMISSION DATE:  02/11/2020, CONSULTATION DATE:  02/11/2020 REFERRING MD:  TRH, CHIEF COMPLAINT:  Respiratory failure   Brief History   Dawn Beasley Is a 69 y.o. woman with PMH of HTN, undiagnosed COPD, chronic gastric dysmotility, chronic emesis who underwent and EGD and developed a right-sided aspiration pneumonia afterwards.  She progressed to acute respiratory failure and sepsis requiring intubation and admission to the ICU.  Extubated on 3/29 and transferred to the floor but developed AMS, agitated delirium and respiratory distress and was transferred back to the ICU.  Found to have a new right subsegmental PE and started on [facility]. Has also been on Precedex drip for agitation and EtOH/benzo withdrawal and as needed BiPAP.   Consults:  None  Procedures:  Intubation 3/27 > 3/29 Transfer out on 3/30  Transferred back to ICU on 4/1  4/7 foley (for significant retention) >>  Significant Diagnostic Tests:  3/27 chest xray with right middle and lower lobe airspace opacities concerning for aspiration CTA chest 3/31 > acute pulmonary embolus within segmental branch of the right lower lobe. Interval development of bilateral pleural effusions L > R. Dense opacification bilaterally.  CT head 3/31 > No evidence of acute intracranial abnormality. Stable chronic small vessel ischemic disease. 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 Echo 3/29 > LVEF 65-70 %. Mild reduced RVSF. ? Of McConnells sign.  CXR 4/5 > Increased L pleural effusion and basilar airspace disease. Mild improvement in diffuse airspace disease throughout the right chest. Small right effusion is unchanged. Cardiomegaly.Emphysema. 4/7 TTE >> EF 65-70%, hyperdynamic, no RWA, G1DD, RVSF normal, moderate left pleural effusion- compared to 3/29 study, RV appears to be improved, less dilated and more contractile.   Micro Data:  MRSA  PCR negative 3/28 RVP negative  3/28 Bcx negative  4/5 Respiratory cx> serratia (resistant to cefazolin only)  Antimicrobials:  Vancomycin 3/27 > 3/28 Cefepime 3/27 > 3/28 Unasyn 3/28 > 3/31 Flagyl 3/31 >4/2 Cefepime 3/31 >4/3, resumed 4/5>4/8  Interim history/subjective:  No events overnight C/o mild anxiety asking for her xanax this morning but no other complaints Currently on 2L [place]  Diuresed -2.4L yesterday  Still poor PO intake, poor appetite, likes ensure  Pending am labs  Objective   Blood pressure 133/77, pulse (!) 113, temperature 98.2 F (36.8 C), temperature source Oral, resp. rate (!) 26, height 5\' 2"  (1.575 m), weight 55 kg, SpO2 100 %.        Intake/Output Summary (Last 24 hours) at 02/23/2020 0734 Last data filed at 02/23/2020 0600 Gross per 24 hour  Intake 1440.69 ml  Output 3850 ml  Net -2409.31 ml   Filed Weights   02/21/20 0500 02/22/20 0406 02/23/20 0357  Weight: 56.9 kg 54.9 kg 55 kg    Physical Exam:  General:  Older adult female sitting in bed in NAD HEENT: MM pink/moist, pupils 4/reactive, anicteric Neuro: alert, oriented x 3, MAE CV: ST, no murmur PULM:  Non labored, speaks full sentences, mildly tachypneic, right with exp wheeze and slightly diminished in right base otherwise clear  GI: protuberant, soft, pt reports less distention than yesterday, non tender, +bs, foley   Extremities: warm/dry, no LE edema  Skin: no rashes  Resolved Hospital Problem list   Septic shock, AKI, elevated troponin, possible COPD exacerbation, hypernatremia, acute metabolic encephalopathy, diarrhea   Assessment & Plan:   # Acute hypoxemic respiratory failure: multifactorial from R  aspiration pneumonia- Serratia, post-EGD, RLE segmental PE, pulmonary edema COPD, benzo withdrawal, and agitation delirium  - Successfully extubated 3/29, continue supplemental O2 to maintain SpO2 90-95%  - d/c cefepime (s/p complete 7 days, then unasyn x 4 days prior to) and monitor  clinically  - continue aggressive pulmonary hygiene, PT/ OT, IS  - hold further diuresis, weaning O2 requirements.  Still slightly tachycardic/ tachypneic- may be related to her anxiety, pleural effusions improving with diuresis by CXR yest - repeat TTE 4/7 shows normal RV systolic function and improved contractility.  Hyperdynamic EF 65-70% - continue dysphagia diet 2 per SLP recs and cortrak for now to maximize nutrition  Addendum: have made patient a follow up appointment in our pulmonary clinic on May 5, at 330 pm with Rexene Edison, NP.  # Acute metabolic encephalopathy:  Resolved  - Unlikely to be EtOH withdrawal given timecourse. Thought related to agitated delirium vs benzodiazepine withdrawal vs metabolic derangements vs sundowning and restless overnight, A&O during the day (slept well overnight per RN) - Delirium precautions  - continue her home xanax prn and oxycodone with bowel regimen  # AKI, acute post renal failure secondary to bladder outlet obstruction  - Foley in now for 24 hours.  Start urecholine 5mg  TID and d/c foley.  Monitor closely for retention.  - BMP pending today  # Pulmonary embolism: R lower lobe segment. I doubt this is a significant contributor to her symptoms. Lower extremity dopplers negative.   - McConell's sign noted on 3/27 echo. Repeat TTE 4/7 shows improved, now normal RV function - weaning O2 as above - will likely transition from heparin to DOAC, likely either eliquis or xarelto with $45 dollar month copay.  Will confirm with husband if they have preference on daily dosing (qd vs BID) to help with compliance   # Essential hypertension - continue PO hydralazine 25 mg TID, losartan 100 mg today - IV hydralazine 10 mg q6h PRN for sBP > 180   # Chronic emesis, gastropathy, abdominal discomfort (improved) # GERD - s/p EGD and colonoscopy 3/22 which showed colitis and reactive gastropathy - protonix BID 40 mg per GI recs  - add daily bowel regimen -  to follow up with GI in 6 weeks (mid May)  # Protein calorie malnutrition, dysphagia 2 diet  - TF at 20 ml/hr to help with maximize nutrition given ongoing poor appetite.  Drinking ensure better today.  Once oral nutrition is better, can consider d/c of cortrak.  Consider adding megace if no improvements.   Best practice:  Diet: cortrak for TF at 20 ml/hr , dysphagia 2 diet  Pain/Anxiety/Delirium protocol (if indicated): home Xanax and oxycodone  VAP protocol (if indicated): NA DVT prophylaxis: heparin gtt  GI prophylaxis: Protonix BID Glucose control: N/A Mobility: PT/OT  Code Status: Full Family Communication: patient updated on plan of care, pending visitation from husband Disposition: ICU  - can transfer out today, to PCU and to [facility] as of 4/9  Labs   CBC: Recent Labs  Lab 02/18/20 0322 02/19/20 0302 02/20/20 0230 02/21/20 0257 02/22/20 0556  WBC 23.0* 18.1* 25.7* 33.2* 29.5*  NEUTROABS 21.3* 16.5* 23.7* 32.5* 28.1*  HGB 7.8* 7.4* 9.0* 10.9* 9.8*  HCT 24.2* 23.4* 28.3* 33.6* 30.0*  MCV 122.2* 124.5* 126.9* 121.7* 121.0*  PLT 306 264 343 406* 161    Basic Metabolic Panel: Recent Labs  Lab 02/16/20 1136 02/16/20 1529 02/17/20 0229 02/17/20 0960 02/17/20 4540 02/17/20 1648 02/18/20 0322 02/19/20 0302 02/20/20 0230  02/21/20 0257 02/22/20 0556  NA  --   --    < > 149*   < >  --  149* 148* 145 145 143  K  --   --    < > 4.1   < >  --  3.9 3.7 4.4 3.6 4.2  CL  --   --   --  112*   < >  --  112* 111 103 97* 99  CO2  --   --   --  23   < >  --  28 29 34* 36* 29  GLUCOSE  --   --   --  147*   < >  --  145* 136* 166* 138* 99  BUN  --   --   --  30*   < >  --  25* 17 14 11 19   CREATININE  --   --   --  0.64   < >  --  0.58 0.45 0.46 0.55 0.91  CALCIUM  --   --   --  8.4*   < >  --  8.2* 7.5* 8.0* 8.7* 8.4*  MG 2.0 2.0  --  2.2  --  2.1  --   --   --   --   --   PHOS 3.0 2.9  --  2.6  --  2.4*  --   --  2.7  --   --    < > = values in this interval not displayed.    GFR: Estimated Creatinine Clearance: 48.1 mL/min (by C-G formula based on SCr of 0.91 mg/dL). Recent Labs  Lab 02/19/20 0302 02/20/20 0230 02/21/20 0257 02/22/20 0556  WBC 18.1* 25.7* 33.2* 29.5*    Liver Function Tests: Recent Labs  Lab 02/17/20 0646 02/18/20 0322 02/19/20 0302 02/20/20 0230  AST 30 20 15 19   ALT 44 37 28 28  ALKPHOS 73 77 59 80  BILITOT 0.7 0.7 0.4 0.2*  PROT 5.3* 5.2* 4.3* 5.7*  ALBUMIN 2.5* 2.4* 2.1* 2.7*   No results for input(s): LIPASE, AMYLASE in the last 168 hours. Recent Labs  Lab 02/17/20 0646  AMMONIA 43*    ABG    Component Value Date/Time   PHART 7.419 02/17/2020 0229   PCO2ART 41.9 02/17/2020 0229   PO2ART 67.0 (L) 02/17/2020 0229   HCO3 27.1 02/17/2020 0229   TCO2 28 02/17/2020 0229   ACIDBASEDEF 4.0 (H) 02/12/2020 0511   O2SAT 93.0 02/17/2020 0229     Coagulation Profile: No results for input(s): INR, PROTIME in the last 168 hours.  Cardiac Enzymes: No results for input(s): CKTOTAL, CKMB, CKMBINDEX, TROPONINI in the last 168 hours.  HbA1C: No results found for: HGBA1C  CBG: Recent Labs  Lab 02/22/20 1115 02/22/20 1603 02/22/20 1914 02/22/20 2321 02/23/20 0314  GLUCAP 118* 149* 119* 123* 124*      04/23/20, MSN, AGACNP-BC [facility] 02/23/2020, 7:35 AM    PCCM Attending:   69 yo FM, asp pna following EGD, serratia, Right segmental PE, AKI urinary rention.  BP 122/62   Pulse (!) 120   Temp 98.6 F (37 C) (Oral)   Resp (!) 31   Ht 5\' 2"  (1.575 m)   Wt 55 kg   SpO2 93%   BMI 22.18 kg/m   Gen: elderly FM, comforatable in bed today  ENT: alert, tracking  Heart: RRR s1 s2  Lungs: diminshed no wheeze   Labs reviewed  Cxr: improved  effusions, The patient's images have been independently reviewed by me.    A:  AHRF from asp pna, serratia, resolving, weaning [place] LPM  Positive fluid balance  Pleural effusions, resolving with diuresis  BOO, urinary retention   Leukocytosis improving  P: Stop abx and observe  Holding additional diuresis at this time Let her regulate with her own fluid intake  Up out of bed  PT/OT  TF to supplement nutrition He has poor intake right now Delirium precautions Stable for transfer to the floor  Foley for BOO  Josephine Igo, DO [facility] 02/23/2020 4:09 PM

## 2020-02-23 NOTE — Progress Notes (Addendum)
ANTICOAGULATION CONSULT NOTE - Initial Consult  Pharmacy Consult for Heparin>>Apixaban Indication: pulmonary embolus  Allergies  Allergen Reactions  . Celebrex [Celecoxib] Swelling    Throat swelling  . Sulfa Antibiotics Swelling    Throat swelling    Patient Measurements: Height: 5\' 2"  (157.5 cm) Weight: 55 kg (121 lb 4.1 oz) IBW/kg (Calculated) : 50.1  Vital Signs: Temp: 98.8 F (37.1 C) (04/08 0759) Temp Source: Oral (04/08 0759) BP: 133/64 (04/08 1000) Pulse Rate: 114 (04/08 1000)  Labs: Recent Labs    02/21/20 0257 02/22/20 0556 02/23/20 0933  HGB 10.9* 9.8*  --   HCT 33.6* 30.0*  --   PLT 406* 283  --   HEPARINUNFRC 0.37 0.38 0.31  CREATININE 0.55 0.91 0.55    Estimated Creatinine Clearance: 54.7 mL/min (by C-G formula based on SCr of 0.55 mg/dL).   Medical History: Past Medical History:  Diagnosis Date  . AAA (abdominal aortic aneurysm) (HCC)   . Anxiety   . Arthritis   . Back pain   . Cervical disc herniation   . Depression   . 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 Hypercholesteremia   . Hypertension   . Panic disorder   . Stenosis, spinal, lumbar     Medications:  Medications Prior to Admission  Medication Sig Dispense Refill Last Dose  . ALPRAZolam (XANAX) 1 MG tablet Take 0.5 tablets (0.5 mg total) by mouth 2 (two) times daily. 30 tablet 0 02/11/2020 at Unknown time  . finasteride (PROSCAR) 5 MG tablet Take 5 mg by mouth daily.   02/11/2020 at Unknown time  . losartan (COZAAR) 100 MG tablet Take 100 mg by mouth daily.   02/11/2020 at Unknown time  . ondansetron (ZOFRAN) 4 MG tablet Take 1 tablet (4 mg total) by mouth every 8 (eight) hours as needed for nausea or vomiting. 20 tablet 0 02/11/2020 at Unknown time  . oxyCODONE-acetaminophen (PERCOCET) 10-325 MG tablet Take 1 tablet by mouth every 4 (four) hours as needed for pain. 60 tablet 0 02/11/2020 at Unknown time  . pantoprazole (PROTONIX) 40 MG tablet Take 1 tablet (40 mg total) by mouth 2 (two)  times daily. 60 tablet 11 Past Week at Unknown time  . pravastatin (PRAVACHOL) 40 MG tablet Take 40 mg by mouth daily.  5 02/11/2020 at Unknown time  . traZODone (DESYREL) 100 MG tablet Take 100 mg by mouth at bedtime as needed for sleep.   Past Week at Unknown time  . gabapentin (NEURONTIN) 600 MG tablet Take 1 tablet (600 mg total) by mouth 3 (three) times daily. (Patient not taking: Reported on 02/12/2020) 30 tablet 0 Not Taking at Unknown time   Scheduled:  . bethanechol  5 mg Oral TID  . Chlorhexidine Gluconate Cloth  6 each Topical Daily  . famotidine  20 mg Per Tube Daily  . feeding supplement (ENSURE ENLIVE)  237 mL Oral TID BM  . feeding supplement (OSMOLITE 1.2 CAL)  1,000 mL Per Tube Q24H  . folic acid  1 mg Per Tube Daily  . guaiFENesin  10 mL Per Tube Q6H  . hydrALAZINE  25 mg Oral Q8H  . insulin aspart  0-9 Units Subcutaneous Q4H  . ipratropium  0.5 mg Nebulization BID  . levalbuterol  0.63 mg Nebulization BID  . mouth rinse  15 mL Mouth Rinse BID  . multivitamin with minerals  1 tablet Per Tube Daily  . nicotine  21 mg Transdermal Daily  . polyethylene glycol  17 g Oral Daily  .  potassium chloride  40 mEq Per Tube BID  . thiamine  100 mg Per Tube Daily    Assessment: 69 yr old female with medical history significant for GERD, wt loss,esophagitis, AAA, HTN, HLDwas admitted on 02/12/20 for sepsis due to aspiration PNA. No AC PTA. Pt had recent EGD/colonoscopy and was found to have gastritis, with recent episode of hematemesis. Chest CTA showed acute PE within segmental branch of RLL pulmonary artery. Pharmacy is consulted to transition from heparin to Apixaban for new PE.   Patient has been on heparin gtt for total of 8 days not including today and has had therapeutic heparin levels for 8 days consecutively. Will not give Apixaban loading dose of 10 mg BID for 7 days as therapeutic heparin levels are sufficient for anticoagulation load. Will transition patient to Apixaban 5 mg  BID today. CBC stable.  Goal of Therapy:  Monitor platelets by anticoagulation protocol: Yes   Plan:  Discontinue Heparin gtt Start Apixaban 5 mg PO BID  Provide education Monitor for s/sx of bleeding  Lorel Monaco, PharmD PGY1 Ambulatory Care Resident [employer] # [phone]

## 2020-02-23 NOTE — Discharge Instructions (Signed)
Information on my medicine - ELIQUIS (apixaban)  This medication education was reviewed with me or my healthcare representative as part of my discharge preparation.  Why was Eliquis prescribed for you? Eliquis was prescribed to treat blood clots that may have been found in the veins of your legs (deep vein thrombosis) or in your lungs (pulmonary embolism) and to reduce the risk of them occurring again.  What do You need to know about Eliquis ? The starting dose is 5 mg tablet taken TWICE daily.  Eliquis may be taken with or without food.   Try to take the dose about the same time in the morning and in the evening. If you have difficulty swallowing the tablet whole please discuss with your pharmacist how to take the medication safely.  Take Eliquis exactly as prescribed and DO NOT stop taking Eliquis without talking to the doctor who prescribed the medication.  Stopping may increase your risk of developing a new blood clot.  Refill your prescription before you run out.  After discharge, you should have regular check-up appointments with your healthcare provider that is prescribing your Eliquis.    What do you do if you miss a dose? If a dose of ELIQUIS is not taken at the scheduled time, take it as soon as possible on the same day and twice-daily administration should be resumed. The dose should not be doubled to make up for a missed dose.  Important Safety Information A possible side effect of Eliquis is bleeding. You should call your healthcare provider right away if you experience any of the following: Bleeding from an injury or your nose that does not stop. Unusual colored urine (red or dark brown) or unusual colored stools (red or black). Unusual bruising for unknown reasons. A serious fall or if you hit your head (even if there is no bleeding).  Some medicines may interact with Eliquis and might increase your risk of bleeding or clotting while on Eliquis. To help avoid  this, consult your healthcare provider or pharmacist prior to using any new prescription or non-prescription medications, including herbals, vitamins, non-steroidal anti-inflammatory drugs (NSAIDs) and supplements.  This website has more information on Eliquis (apixaban): http://www.eliquis.com/eliquis/home  

## 2020-02-23 NOTE — Progress Notes (Signed)
Pt given flutter valve at this time and instructed how to use it

## 2020-02-23 NOTE — Progress Notes (Signed)
Physical Therapy Treatment Patient Details Name: Dawn Beasley MRN: [id number] DOB: [birth year] Today's Date: 02/23/2020    History of Present Illness Pt adm with acute hypoxemic respiratory failure due to aspiration PNA. Intubated 3/27-3/29. Transferred out of ICU 3/30 and back into ICU on 4/1 with respiratory distress and placed on bipap.    PMH - HTN, etoh abuse, opioid abuse, spinal stenosis, copd, AAA    PT Comments    Pt expressed having a little less anxiety today during the session.  Pt's HR continued to rise up into the mid 140's bpm and return to upper 110's to low 120's with sitting rest.  Emphasis on transition, sitting balance, sit to stand and progression of ambulation distance/stamina    Follow Up Recommendations  CIR;Supervision/Assistance - 24 hour     Equipment Recommendations  Other (comment)(TBA)    Recommendations for Other Services       Precautions / Restrictions Precautions Precautions: Fall    Mobility  Bed Mobility Overal bed mobility: Needs Assistance Bed Mobility: Supine to Sit     Supine to sit: Min assist     General bed mobility comments: pt rolled over onto L elbow well then needed truncal assist to come fully upright.  Transfers Overall transfer level: Needs assistance Equipment used: Rolling walker (2 wheeled) Transfers: Sit to/from Stand Sit to Stand: Min assist;+2 physical assistance         General transfer comment: consistent cues for hand placement (up and down) and assist to come forward with some boost.  Ambulation/Gait Ambulation/Gait assistance: Min assist Gait Distance (Feet): 50 Feet(x2, then 20 feet with sitting rest in between) Assistive device: Rolling walker (2 wheeled) Gait Pattern/deviations: Step-through pattern Gait velocity: decr Gait velocity interpretation: <1.31 ft/sec, indicative of household ambulator General Gait Details: mildly unsteady, short steps with pt able to stand upright despite sore buttock  muscles from yesterdays session.   Stairs             Wheelchair Mobility    Modified Rankin (Stroke Patients Only)       Balance Overall balance assessment: Needs assistance   Sitting balance-Leahy Scale: Fair(to good) Sitting balance - Comments: sat with no UE's and reached outside BOS with each hand from left to right to give "high fives"     Standing balance-Leahy Scale: Poor Standing balance comment: reliant on AD and external support                            Cognition Arousal/Alertness: Awake/alert Behavior During Therapy: Anxious;WFL for tasks assessed/performed Overall Cognitive Status: (NT formally)                                        Exercises Other Exercises Other Exercises: Warm up hip/knee ROM exercise with resisted gross extension x 10 bil    General Comments        Pertinent Vitals/Pain Pain Assessment: Faces Faces Pain Scale: No hurt Pain Intervention(s): Monitored during session    Home Living                      Prior Function            PT Goals (current goals can now be found in the care plan section) Acute Rehab PT Goals PT Goal Formulation: With patient Time For Goal Achievement: 03/03/20  Potential to Achieve Goals: Fair Progress towards PT goals: Progressing toward goals    Frequency    Min 3X/week      PT Plan Current plan remains appropriate    Co-evaluation              AM-PAC PT "6 Clicks" Mobility   Outcome Measure  Help needed turning from your back to your side while in a flat bed without using bedrails?: A Little Help needed moving from lying on your back to sitting on the side of a flat bed without using bedrails?: A Little Help needed moving to and from a bed to a chair (including a wheelchair)?: A Little Help needed standing up from a chair using your arms (e.g., wheelchair or bedside chair)?: A Little Help needed to walk in hospital room?: A Lot Help  needed climbing 3-5 steps with a railing? : Total 6 Click Score: 15    End of Session Equipment Utilized During Treatment: Oxygen Activity Tolerance: Patient tolerated treatment well;Patient limited by fatigue Patient left: in chair;with call bell/phone within reach;with chair alarm set;with family/visitor present Nurse Communication: Mobility status PT Visit Diagnosis: Unsteadiness on feet (R26.81);Other abnormalities of gait and mobility (R26.89);Muscle weakness (generalized) (M62.81)     Time: 0962-8366 PT Time Calculation (min) (ACUTE ONLY): 36 min  Charges:  $Gait Training: 8-22 mins $Therapeutic Activity: 8-22 mins                     02/23/2020  Jacinto Halim., PT Acute Rehabilitation Services [phone]  (pager) [phone]  (office)   Eliseo Gum Klee Kolek 02/23/2020, 5:16 PM

## 2020-02-24 DIAGNOSIS — J690 Pneumonitis due to inhalation of food and vomit: Secondary | ICD-10-CM | POA: Diagnosis not present

## 2020-02-24 DIAGNOSIS — J9621 Acute and chronic respiratory failure with hypoxia: Secondary | ICD-10-CM | POA: Diagnosis not present

## 2020-02-24 DIAGNOSIS — N179 Acute kidney failure, unspecified: Secondary | ICD-10-CM | POA: Diagnosis not present

## 2020-02-24 DIAGNOSIS — R410 Disorientation, unspecified: Secondary | ICD-10-CM | POA: Diagnosis not present

## 2020-02-24 LAB — GLUCOSE, CAPILLARY
Glucose-Capillary: 87 mg/dL (ref 70–99)
Glucose-Capillary: 109 mg/dL — ABNORMAL HIGH (ref 70–99)
Glucose-Capillary: 98 mg/dL (ref 70–99)
Glucose-Capillary: 92 mg/dL (ref 70–99)
Glucose-Capillary: 119 mg/dL — ABNORMAL HIGH (ref 70–99)

## 2020-02-24 LAB — CBC WITH DIFFERENTIAL/PLATELET
Abs Immature Granulocytes: 0.09 10*3/uL — ABNORMAL HIGH (ref 0.00–0.07)
Basophils Absolute: 0 10*3/uL (ref 0.0–0.1)
Basophils Relative: 0 %
Eosinophils Absolute: 0.1 10*3/uL (ref 0.0–0.5)
Eosinophils Relative: 1 %
HCT: 24.8 % — ABNORMAL LOW (ref 36.0–46.0)
Hemoglobin: 8.2 g/dL — ABNORMAL LOW (ref 12.0–15.0)
Immature Granulocytes: 1 %
Lymphocytes Relative: 3 %
Lymphs Abs: 0.4 10*3/uL — ABNORMAL LOW (ref 0.7–4.0)
MCH: 39.6 pg — ABNORMAL HIGH (ref 26.0–34.0)
MCHC: 33.1 g/dL (ref 30.0–36.0)
MCV: 119.8 fL — ABNORMAL HIGH (ref 80.0–100.0)
Monocytes Absolute: 0.6 10*3/uL (ref 0.1–1.0)
Monocytes Relative: 5 %
Neutro Abs: 10.8 10*3/uL — ABNORMAL HIGH (ref 1.7–7.7)
Neutrophils Relative %: 90 %
Platelets: 245 10*3/uL (ref 150–400)
RBC: 2.07 MIL/uL — ABNORMAL LOW (ref 3.87–5.11)
RDW: 15.2 % (ref 11.5–15.5)
WBC: 11.9 10*3/uL — ABNORMAL HIGH (ref 4.0–10.5)
nRBC: 0 % (ref 0.0–0.2)

## 2020-02-24 LAB — BASIC METABOLIC PANEL
Anion gap: 12 (ref 5–15)
BUN: 12 mg/dL (ref 8–23)
CO2: 33 mmol/L — ABNORMAL HIGH (ref 22–32)
Calcium: 8.6 mg/dL — ABNORMAL LOW (ref 8.9–10.3)
Chloride: 97 mmol/L — ABNORMAL LOW (ref 98–111)
Creatinine, Ser: 0.45 mg/dL (ref 0.44–1.00)
GFR calc Af Amer: >60 mL/min (ref >60)
GFR calc non Af Amer: >60 mL/min (ref >60)
Glucose, Bld: 113 mg/dL — ABNORMAL HIGH (ref 70–99)
Potassium: 3.5 mmol/L (ref 3.5–5.1)
Sodium: 142 mmol/L (ref 135–145)

## 2020-02-24 MED ORDER — TRAZODONE HCL 50 MG PO TABS
50.0000 mg | ORAL_TABLET | Freq: Every evening | ORAL | Status: DC | PRN
  Administered 2020-02-24 – 2020-02-25 (×3): 50 mg via ORAL
  Filled 2020-02-24 (×3): qty 1

## 2020-02-24 MED ORDER — OSMOLITE 1.2 CAL PO LIQD
840.0000 mL | ORAL | Status: DC
  Administered 2020-02-24: 840 mL
  Filled 2020-02-24 (×7): qty 948

## 2020-02-24 NOTE — Progress Notes (Signed)
Nutrition Follow-up   DOCUMENTATION CODES:   Not applicable  INTERVENTION:   Change TF to nocturnal regimen via Cortrak feeding tube:  Osmolite 1.2 at 70 ml/h x 12 hours from 6 pm to 6 am.  Provides 1008 kcal, 47 gm protein, 689 ml free water daily (56% of kcal needs and 52% of protein needs).   Continue Ensure Enlive po TID, each supplement provides 350 kcal and 20 grams of protein.  NUTRITION DIAGNOSIS:   Inadequate oral intake related to poor appetite, nausea, acute illness as evidenced by meal completion < 25%.  Ongoing   GOAL:   Patient will meet greater than or equal to 90% of their needs  Unmet  MONITOR:   PO intake, Supplement acceptance  ASSESSMENT:   69 yo female with AMS and emesis with hypoxic respiratory failure with aspiration pneumonia, AKI, in shock . Pt with recent EGD for nausea, vomiting, weight loss over the last 2 years. PMH includes HTN, depression, anxiety  3/27-3/29: Intubated 3/30-4/01: Nectar thick clear liquids 4/06: Diet advanced to Dysphagia 2 with thin liquids by SLP 4/09 Diet advanced to Dysphagia 3 with thin liquids by SLP  Currently on 4L [place]. Has been consuming 0-25% of meals. She is being offered Ensure Enlive supplements 2-3 times per day.  Cortrak in place. Patient is receiving Osmolite 1.2 at 20 ml/h.  Per discussion with MD and SLP, will change TF to nocturnal regimen to hopefully help increase PO intake during the day.  Labs reviewed.  CBG's: 87-109  Medications reviewed and include folic acid, novolog, MVI, miralax, thiamine.  Weight 55 kg yesterday, 54.5 kg on admission  Diet Order:   Diet Order            DIET DYS 3 Room service appropriate? Yes; Fluid consistency: Thin  Diet effective now              EDUCATION NEEDS:   Not appropriate for education at this time  Skin:  Skin Assessment: Reviewed RN Assessment  Last BM:  4/9 type 6  Height:   Ht Readings from Last 1 Encounters:  02/16/20 5\' 2"  (1.575  m)    Weight:   Wt Readings from Last 1 Encounters:  02/23/20 55 kg   BMI:  Body mass index is 22.18 kg/m.  Estimated Nutritional Needs:   Kcal:  1700-1900 kcal  Protein:  85-95 g  Fluid:  >/= 1.7 L    04/24/20, RD, LDN, CNSC Please refer to Amion for contact information.

## 2020-02-24 NOTE — Progress Notes (Signed)
Inpatient Rehab Admissions:  Inpatient Rehab Consult received.  I met with patient and her family at the bedside for rehabilitation assessment and to discuss goals and expectations of an inpatient rehab admission.  They are hopeful for CIR admission, potentially next week (pending bed availability and insurance authorization).  Feel pt could likely reach mod I level, or at least intermittent supervision.  Will f/u with pt next week with bed availability and to check in on progress with mobility.   Signed: Shann Medal, PT, DPT Admissions Coordinator [phone] 02/24/20  1:58 PM

## 2020-02-24 NOTE — Progress Notes (Signed)
eLink Physician-Brief Progress Note Patient Name: Dawn Beasley DOB: 03/26/1951 MRN: [id number]   Date of Service  02/24/2020  HPI/Events of Note  Pt asking for Trazodone for insomnia, she states that she takes it at  home.  eICU Interventions  Trazodone 50 mg PO Q HS prn insomnia ordered.        Thomasene Lot Maximo Spratling 02/24/2020, 12:20 AM

## 2020-02-24 NOTE — Progress Notes (Addendum)
  Speech Language Pathology Treatment: Dysphagia  Patient Details Name: Dawn Beasley MRN: [id number] DOB: [birth year] Today's Date: 02/24/2020 Time: 7867-6720 SLP Time Calculation (min) (ACUTE ONLY): 16 min  Assessment / Plan / Recommendation Clinical Impression  Overall, pt's endurance is improving- increased respiratory rate to 44 following mild activity/exertion consistently after repositioning herself in bed and masticating. Noted to have immediate and delayed throat clears after thin water via straw. Her chest xray yesterday was stable showing "marked improvement in bilateral effusions and right worse than left airspace disease." Discussed various solid texture options and Dys 3 (mechanical soft) with chopped meats with soft side items was decided agreed upon given dyspnea. Educated pt to ensure small sips and rest breaks.   Last visit pt's husband stated she ate very small amounts at home. Spoke with dietitian who will switch feeds to nocturnal to facilitate hunger during the day.     HPI HPI: 69 yo female adm to [facility] with respiratory issues requiring intubation - Recent endoscopy 02/06/2020 showing esophagitis.  She reports post-endoscopy severe vomiting = dark colored emesis.  [name] due to frequent issues with vomiting and weight per her report.  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  Imaging studies show severe right multifocal infiltrates c/w pna.   Pt denies issues with swallowing but admits to vomiting and weight loss. Pt developed altered mental status, intermittent agitation/lethargy, evolving respiratory failure requiring BiPAP and transferred to ICU. Now off Bipap and on HFNC 4.      SLP Plan  Continue with current plan of care       Recommendations  Diet recommendations: Dysphagia 3 (mechanical soft);Thin liquid Liquids provided via: Cup;Straw Medication Administration: Whole meds with puree Supervision: Patient able to self  feed Compensations: Slow rate;Small sips/bites;Other (Comment) Postural Changes and/or Swallow Maneuvers: Seated upright 90 degrees                Oral Care Recommendations: Oral care BID Follow up Recommendations: Inpatient Rehab SLP Visit Diagnosis: Dysphagia, unspecified (R13.10) Plan: Continue with current plan of care       GO                [name] 02/24/2020, 10:25 AM  Breck Coons Lonell Face.Ed Nurse, children's [phone] Office [phone]

## 2020-02-24 NOTE — Progress Notes (Signed)
Rehab Admissions Coordinator Note:  Patient was screened by Clois Dupes for appropriateness for an Inpatient Acute Rehab Consult per PT recs.  At this time, we are recommending Inpatient Rehab consult. I will place order per protocol.  Clois Dupes RN MSN 02/24/2020, 9:05 AM  I can be reached at [phone].

## 2020-02-24 NOTE — Progress Notes (Signed)
PROGRESS NOTE    Dawn Beasley  WJX:[id number] DOB: Jan 17, 1951 DOA: 02/11/2020 PCP: Cyndi Bender, PA-C   Brief Narrative:  69 year old female prior history of COPD, hypertension, chronic gastric dysmotility, chronic emesis developed a right-sided aspiration pneumonia intubated on 02/11/2020 and extubated on 02/13/2020.  She was initially transferred out of the ICU on 02/14/2020 but was back in the ICU on 02/16/2020 for worsening agitation.  CT of the head on 3/431 does not show any acute intracranial abnormality.  He was also found to have right lower lobe segmental pulmonary embolism.  Subsequent echocardiogram does not show any right heart strain.  She had prolonged ICU stay due to acute metabolic encephalopathy.  Currently she is alert and oriented and has been transferred out of ICU to [facility] service on 02/24/2020. Therapy evaluations recommending CIR.  CIR evaluation pending at this time.  Assessment & Plan:   Active Problems:   Essential hypertension   Pancytopenia (HCC)   Sepsis ([place])   Aspiration pneumonia ([place])   Acute on chronic respiratory failure with hypoxia (HCC)   Hyponatremia   AKI (acute kidney injury) ([place])   Dehydration   Rhabdomyolysis   Elevated troponin   Respiratory failure (HCC)   Multiple subsegmental pulmonary emboli without acute cor pulmonale (HCC)   Delirium   Acute hypoxic respiratory failure Multifactorial probably from aspiration pneumonia right lower lobe segmental pulmonary embolism, COPD exacerbation, pulmonary edema, benzo withdrawal She was initially intubated on 3/27 and successfully extubated on 02/13/2020.  Patient is currently requiring up to 4 L of nasal cannula oxygen to keep sats greater than 90%.  Patient has completed a total of 10 days of IV antibiotics at this time. Echocardiogram shows normal RV systolic function and hyperdynamic left ventricular ejection fraction of 65 to 70%      Acute metabolic encephalopathy Resolved probably secondary  to a combination of benzodiazepine withdrawal, metabolic derangements, sundowning, hospital delirium. Patient is currently alert and oriented.  Continue with Xanax and oxycodone at this time.    Acute kidney injury s/p bladder outlet obstruction Continue with Urecholine 5 mg 3 times daily    Right lower lobe segmental pulmonary embolism suspected incidental finding.  Lower extremity Dopplers are negative.  Continue with Eliquis at this time and wean her off the oxygen as appropriate.    Essential hypertension Blood pressure parameters are well controlled continue with hydralazine and losartan.    9  History of chronic dysmotility, gastropathy and emesis. GERD S/p endoscopy and colonoscopy on 02/06/2020 showing colitis and reactive gastropathy. Continue with Protonix 40 mg twice daily Continue with the bowel regimen and follow-up with gastroenterology in about 6 weeks.   Protein calorie malnutrition  Speech evaluation recommending dysphagia 3 diet but she was also started on tube feeds via core track to supplement nutrition.   DVT prophylaxis: Eliquis Code Status: Full code Family Communication: Family at bedside Disposition Plan:  . Patient came from:  Home          . Anticipated d/c place: CIR versus SNF Barriers to d/c OR conditions which need to be met to effect a safe d/c: Pending CIR evaluation  Consultants:   PCCM  Procedures:  Antimicrobials: None Subjective: Patient denies any chest pain or shortness of breath at this time  Objective: Vitals:   02/24/20 0800 02/24/20 0815 02/24/20 0821 02/24/20 1000  BP: (!) 131/58   140/76  Pulse: (!) 111   (!) 106  Resp: 20   (!) 26  Temp: 97.9 F (36.6  C)     TempSrc: Oral     SpO2: 99% 100% 100% 98%  Weight:      Height:        Intake/Output Summary (Last 24 hours) at 02/24/2020 1355 Last data filed at 02/24/2020 0600 Gross per 24 hour  Intake 200 ml  Output 1231 ml  Net -1031 ml   Filed Weights   02/21/20  0500 02/22/20 0406 02/23/20 0357  Weight: 56.9 kg 54.9 kg 55 kg    Examination:  General exam: Appears calm and comfortable on 4 L of nasal cannula oxygen Respiratory system: Clear to auscultation. Respiratory effort normal. Cardiovascular system: S1 & S2 heard, RRR.  No JVD. No pedal edema. Gastrointestinal system: Abdomen is nondistended, soft and nontender.  Normal bowel sounds heard. Central nervous system: Alert and oriented. No focal neurological deficits. Extremities: Symmetric 5 x 5 power. Skin: No rashes, lesions or ulcers Psychiatry:  Mood & affect appropriate.     Data Reviewed: I have personally reviewed following labs and imaging studies  CBC: Recent Labs  Lab 02/20/20 0230 02/21/20 0257 02/22/20 0556 02/23/20 0933 02/24/20 0937  WBC 25.7* 33.2* 29.5* 19.7* 11.9*  NEUTROABS 23.7* 32.5* 28.1* 18.4* 10.8*  HGB 9.0* 10.9* 9.8* 8.3* 8.2*  HCT 28.3* 33.6* 30.0* 25.8* 24.8*  MCV 126.9* 121.7* 121.0* 122.9* 119.8*  PLT 343 406* 283 260 416   Basic Metabolic Panel: Recent Labs  Lab 02/17/20 1648 02/18/20 0322 02/20/20 0230 02/21/20 0257 02/22/20 0556 02/23/20 0933 02/24/20 0937  NA  --    < > 145 145 143 145 142  K  --    < > 4.4 3.6 4.2 3.1* 3.5  CL  --    < > 103 97* 99 98 97*  CO2  --    < > 34* 36* 29 35* 33*  GLUCOSE  --    < > 166* 138* 99 127* 113*  BUN  --    < > _0 CREATININE  --    < > 0.46 0.55 0.91 0.55 0.45  CALCIUM  --    < > 8.0* 8.7* 8.4* 8.5* 8.6*  MG 2.1  --   --   --   --   --   --   PHOS 2.4*  --  2.7  --   --   --   --    < > = values in this interval not displayed.   GFR: Estimated Creatinine Clearance: 54.7 mL/min (by C-G formula based on SCr of 0.45 mg/dL). Liver Function Tests: Recent Labs  Lab 02/18/20 0322 02/19/20 0302 02/20/20 0230  AST _1 ALT 37 28 28  ALKPHOS 77 59 80  BILITOT 0.7 0.4 0.2*  PROT 5.2* 4.3* 5.7*  ALBUMIN 2.4* 2.1* 2.7*   No results for input(s): LIPASE, AMYLASE in the last 168  hours. No results for input(s): AMMONIA in the last 168 hours. Coagulation Profile: No results for input(s): INR, PROTIME in the last 168 hours. Cardiac Enzymes: No results for input(s): CKTOTAL, CKMB, CKMBINDEX, TROPONINI in the last 168 hours. BNP (last 3 results) No results for input(s): PROBNP in the last 8760 hours. HbA1C: No results for input(s): HGBA1C in the last 72 hours. CBG: Recent Labs  Lab 02/23/20 1913 02/23/20 2316 02/24/20 0316 02/24/20 0820 02/24/20 1154  GLUCAP 102* 121* 87 109* 98   Lipid Profile: No results for input(s): CHOL, HDL, LDLCALC, TRIG, CHOLHDL, LDLDIRECT in the last 72 hours. Thyroid  Function Tests: Recent Labs    02/23/20 0933  TSH 1.806  FREET4 0.94   Anemia Panel: No results for input(s): VITAMINB12, FOLATE, FERRITIN, TIBC, IRON, RETICCTPCT in the last 72 hours. Sepsis Labs: No results for input(s): PROCALCITON, LATICACIDVEN in the last 168 hours.  Recent Results (from the past 240 hour(s))  Culture, respiratory (non-expectorated)     Status: None   Collection Time: 02/20/20 12:16 PM   Specimen: Tracheal Aspirate; Respiratory  Result Value Ref Range Status   Specimen Description TRACHEAL ASPIRATE  Final   Special Requests Normal  Final   Gram Stain   Final    NO WBC SEEN NO ORGANISMS SEEN Performed at [facility], [address], [place], [place] [address]    Culture RARE CANDIDA ALBICANS RARE SERRATIA MARCESCENS   Final   Report Status 02/22/2020 FINAL  Final   Organism ID, Bacteria SERRATIA MARCESCENS  Final      Susceptibility   Serratia marcescens - MIC*    CEFAZOLIN >=64 RESISTANT Resistant     CEFEPIME <=0.12 SENSITIVE Sensitive     CEFTAZIDIME <=1 SENSITIVE Sensitive     CEFTRIAXONE <=0.25 SENSITIVE Sensitive     CIPROFLOXACIN <=0.25 SENSITIVE Sensitive     GENTAMICIN <=1 SENSITIVE Sensitive     TRIMETH/SULFA <=20 SENSITIVE Sensitive     * RARE SERRATIA MARCESCENS         Radiology Studies: No results  found.      Scheduled Meds: . apixaban  5 mg Oral BID  . bethanechol  5 mg Oral TID  . Chlorhexidine Gluconate Cloth  6 each Topical Daily  . feeding supplement (ENSURE ENLIVE)  237 mL Oral TID BM  . feeding supplement (OSMOLITE 1.2 CAL)  840 mL Per Tube Q24H  . folic acid  1 mg Per Tube Daily  . Gerhardt's butt cream   Topical TID  . guaiFENesin  10 mL Per Tube Q6H  . hydrALAZINE  25 mg Oral Q8H  . insulin aspart  0-9 Units Subcutaneous Q4H  . ipratropium  0.5 mg Nebulization BID  . levalbuterol  0.63 mg Nebulization BID  . mouth rinse  15 mL Mouth Rinse BID  . multivitamin with minerals  1 tablet Per Tube Daily  . nicotine  21 mg Transdermal Daily  . pantoprazole sodium  40 mg Per Tube BID  . polyethylene glycol  17 g Oral Daily  . thiamine  100 mg Per Tube Daily   Continuous Infusions: . sodium chloride Stopped (02/16/20 1146)     LOS: 12 days        Hosie Poisson, MD [facility]   To contact the attending provider between 7A-7P or the covering provider during after hours 7P-7A, please log into the web site [email or web address] and access using universal [place] password for that web site. If you do not have the password, please call the hospital operator.  02/24/2020, 1:55 PM

## 2020-02-25 ENCOUNTER — Inpatient Hospital Stay (HOSPITAL_COMMUNITY): Payer: PPO

## 2020-02-25 DIAGNOSIS — R410 Disorientation, unspecified: Secondary | ICD-10-CM | POA: Diagnosis not present

## 2020-02-25 DIAGNOSIS — J690 Pneumonitis due to inhalation of food and vomit: Secondary | ICD-10-CM | POA: Diagnosis not present

## 2020-02-25 DIAGNOSIS — N179 Acute kidney failure, unspecified: Secondary | ICD-10-CM | POA: Diagnosis not present

## 2020-02-25 DIAGNOSIS — J9621 Acute and chronic respiratory failure with hypoxia: Secondary | ICD-10-CM | POA: Diagnosis not present

## 2020-02-25 LAB — CBC WITH DIFFERENTIAL/PLATELET
Abs Immature Granulocytes: 0.03 10*3/uL (ref 0.00–0.07)
Basophils Absolute: 0 10*3/uL (ref 0.0–0.1)
Basophils Relative: 0 %
Eosinophils Absolute: 0.1 10*3/uL (ref 0.0–0.5)
Eosinophils Relative: 1 %
HCT: 24.4 % — ABNORMAL LOW (ref 36.0–46.0)
Hemoglobin: 7.9 g/dL — ABNORMAL LOW (ref 12.0–15.0)
Immature Granulocytes: 0 %
Lymphocytes Relative: 4 %
Lymphs Abs: 0.4 10*3/uL — ABNORMAL LOW (ref 0.7–4.0)
MCH: 38.5 pg — ABNORMAL HIGH (ref 26.0–34.0)
MCHC: 32.4 g/dL (ref 30.0–36.0)
MCV: 119 fL — ABNORMAL HIGH (ref 80.0–100.0)
Monocytes Absolute: 0.6 10*3/uL (ref 0.1–1.0)
Monocytes Relative: 6 %
Neutro Abs: 9.1 10*3/uL — ABNORMAL HIGH (ref 1.7–7.7)
Neutrophils Relative %: 89 %
Platelets: 227 10*3/uL (ref 150–400)
RBC: 2.05 MIL/uL — ABNORMAL LOW (ref 3.87–5.11)
RDW: 14.5 % (ref 11.5–15.5)
WBC: 10.2 10*3/uL (ref 4.0–10.5)
nRBC: 0 % (ref 0.0–0.2)

## 2020-02-25 LAB — GLUCOSE, CAPILLARY
Glucose-Capillary: 104 mg/dL — ABNORMAL HIGH (ref 70–99)
Glucose-Capillary: 115 mg/dL — ABNORMAL HIGH (ref 70–99)
Glucose-Capillary: 125 mg/dL — ABNORMAL HIGH (ref 70–99)
Glucose-Capillary: 130 mg/dL — ABNORMAL HIGH (ref 70–99)
Glucose-Capillary: 136 mg/dL — ABNORMAL HIGH (ref 70–99)
Glucose-Capillary: 86 mg/dL (ref 70–99)

## 2020-02-25 LAB — BASIC METABOLIC PANEL
Anion gap: 8 (ref 5–15)
BUN: 10 mg/dL (ref 8–23)
CO2: 33 mmol/L — ABNORMAL HIGH (ref 22–32)
Calcium: 8.2 mg/dL — ABNORMAL LOW (ref 8.9–10.3)
Chloride: 99 mmol/L (ref 98–111)
Creatinine, Ser: 0.41 mg/dL — ABNORMAL LOW (ref 0.44–1.00)
GFR calc Af Amer: >60 mL/min (ref >60)
GFR calc non Af Amer: >60 mL/min (ref >60)
Glucose, Bld: 108 mg/dL — ABNORMAL HIGH (ref 70–99)
Potassium: 3.6 mmol/L (ref 3.5–5.1)
Sodium: 140 mmol/L (ref 135–145)

## 2020-02-25 NOTE — Progress Notes (Signed)
Patient refused medication treatment tonight and will do it in the morning.

## 2020-02-25 NOTE — Progress Notes (Signed)
PROGRESS NOTE    Dawn Beasley  EVO:[id number] DOB: [birth year] DOA: 02/11/2020 PCP: Cyndi Bender, PA-C   Brief Narrative:  69 year old female prior history of COPD, hypertension, chronic gastric dysmotility, chronic emesis developed a right-sided aspiration pneumonia intubated on 02/11/2020 and extubated on 02/13/2020.  She was initially transferred out of the ICU on 02/14/2020 but was back in the ICU on 02/16/2020 for worsening agitation.  CT of the head on 3/431 does not show any acute intracranial abnormality.  He was also found to have right lower lobe segmental pulmonary embolism.  Subsequent echocardiogram does not show any right heart strain.  She had prolonged ICU stay due to acute metabolic encephalopathy.  Currently she is alert and oriented and has been transferred out of ICU to [facility] service on 02/24/2020. Therapy evaluations recommending CIR.  CIR evaluation pending at this time. Seen and examined this morning reports ongoing lower abdominal discomfort and unable to urinate.  As per RN patient had in and out cath done already once.  Assessment & Plan:   Active Problems:   Essential hypertension   Pancytopenia (HCC)   Sepsis ([place])   Aspiration pneumonia ([place])   Acute on chronic respiratory failure with hypoxia (HCC)   Hyponatremia   AKI (acute kidney injury) ([place])   Dehydration   Rhabdomyolysis   Elevated troponin   Respiratory failure (HCC)   Multiple subsegmental pulmonary emboli without acute cor pulmonale (HCC)   Delirium   Acute hypoxic respiratory failure Multifactorial probably from aspiration pneumonia right lower lobe segmental pulmonary embolism, COPD exacerbation, pulmonary edema, benzo withdrawal.  She was initially intubated on 3/27 and successfully extubated on 02/13/2020.  Patient is currently requiring up to 3 to 4 L of nasal cannula oxygen to keep sats greater than 90%.   Patient has completed a total of 10 days of IV antibiotics at this time. Echocardiogram shows  normal RV systolic function and hyperdynamic left ventricular ejection fraction of 65 to 70% the patient continues to be tachypneic.    Acute metabolic encephalopathy Resolved probably secondary to a combination of benzodiazepine withdrawal, metabolic derangements, sundowning, hospital delirium. Continue with Xanax and oxycodone at this time.  Patient is currently alert oriented and responding to questions appropriately   Acute kidney injury s/p bladder outlet obstruction S/p In-N-Out cath twice.  Patient also reports lower abdominal wall discomfort.  Foley catheter to be placed today. Continue with Urecholine 5 mg 3 times daily    Right lower lobe segmental pulmonary embolism suspected incidental finding.   Lower extremity Dopplers are negative.   Continue with Eliquis at this time and wean her off the oxygen as appropriate. Patient continues to be tachypneic and appears very deconditioned.    Essential hypertension Blood pressure parameters are well controlled No changes in medication  Hypokalemia Replaced    Anemia of chronic disease/iron deficiency anemia Hemoglobin around 8. Transfuse to keep hemoglobin greater than 7    History of chronic dysmotility, gastropathy and emesis. GERD S/p endoscopy and colonoscopy on 02/06/2020 showing colitis and reactive gastropathy. Continue with Protonix 40 mg twice daily Continue with the bowel regimen and follow-up with gastroenterology in about 6 weeks.   Protein calorie malnutrition Speech evaluation recommending dysphagia 3 diet but she was also started on tube feeds at nights via core track to supplement nutrition.   DVT prophylaxis: Eliquis Code Status: Full code Family Communication: Family at bedside Disposition Plan:  . Patient came from:  Home          .  Anticipated d/c place: CIR versus SNF Barriers to d/c OR conditions which need to be met to effect a safe d/c: Pending CIR evaluation  Consultants:    PCCM  Procedures:  Antimicrobials: None Subjective: Patient denies any chest pain or shortness of breath has some abdominal discomfort in the lower part. Objective: Vitals:   02/25/20 1200 02/25/20 1300 02/25/20 1537 02/25/20 1600  BP: (!) 145/67  138/69 (!) 157/81  Pulse: (!) 112 (!) 107  98  Resp: (!) 40 (!) 41  (!) 27  Temp: 98.3 F (36.8 C)   98.9 F (37.2 C)  TempSrc: Oral   Oral  SpO2: 95% 95%  100%  Weight:      Height:        Intake/Output Summary (Last 24 hours) at 02/25/2020 1621 Last data filed at 02/25/2020 1200 Gross per 24 hour  Intake 1196.66 ml  Output 2200 ml  Net -1003.34 ml   Filed Weights   02/21/20 0500 02/22/20 0406 02/23/20 0357  Weight: 56.9 kg 54.9 kg 55 kg    Examination:  General exam: Alert appears anxious but not in any kind of discomfort on 3 L of nasal cannula oxygen Respiratory system: Diminished at bases, air entry fair at upper lobes, tachypnea present Cardiovascular system: S1-S2 heard, regular rate rhythm, no JVD, no pedal edema Gastrointestinal system: Abdomen is soft, mildly tender and mildly distended, bowel sounds normal Central nervous system: Alert and oriented, grossly nonfocal Extremities: No pedal edema Skin: No rashes seen Psychiatry: Anxious looking    Data Reviewed: I have personally reviewed following labs and imaging studies  CBC: Recent Labs  Lab 02/21/20 0257 02/22/20 0556 02/23/20 0933 02/24/20 0937 02/25/20 1237  WBC 33.2* 29.5* 19.7* 11.9* 10.2  NEUTROABS 32.5* 28.1* 18.4* 10.8* 9.1*  HGB 10.9* 9.8* 8.3* 8.2* 7.9*  HCT 33.6* 30.0* 25.8* 24.8* 24.4*  MCV 121.7* 121.0* 122.9* 119.8* 119.0*  PLT 406* 283 260 245 813   Basic Metabolic Panel: Recent Labs  Lab 02/20/20 0230 02/20/20 0230 02/21/20 0257 02/22/20 0556 02/23/20 0933 02/24/20 0937 02/25/20 1237  NA 145   < > 145 143 145 142 140  K 4.4   < > 3.6 4.2 3.1* 3.5 3.6  CL 103   < > 97* 99 98 97* 99  CO2 34*   < > 36* 29 35* 33* 33*   GLUCOSE 166*   < > 138* 99 127* 113* 108*  BUN 14   < > '11 19 12 12 10  ' CREATININE 0.46   < > 0.55 0.91 0.55 0.45 0.41*  CALCIUM 8.0*   < > 8.7* 8.4* 8.5* 8.6* 8.2*  PHOS 2.7  --   --   --   --   --   --    < > = values in this interval not displayed.   GFR: Estimated Creatinine Clearance: 54.7 mL/min (A) (by C-G formula based on SCr of 0.41 mg/dL (L)). Liver Function Tests: Recent Labs  Lab 02/19/20 0302 02/20/20 0230  AST 15 19  ALT 28 28  ALKPHOS 59 80  BILITOT 0.4 0.2*  PROT 4.3* 5.7*  ALBUMIN 2.1* 2.7*   No results for input(s): LIPASE, AMYLASE in the last 168 hours. No results for input(s): AMMONIA in the last 168 hours. Coagulation Profile: No results for input(s): INR, PROTIME in the last 168 hours. Cardiac Enzymes: No results for input(s): CKTOTAL, CKMB, CKMBINDEX, TROPONINI in the last 168 hours. BNP (last 3 results) No results for input(s): PROBNP in the  last 8760 hours. HbA1C: No results for input(s): HGBA1C in the last 72 hours. CBG: Recent Labs  Lab 02/25/20 0005 02/25/20 0426 02/25/20 0728 02/25/20 1158 02/25/20 1601  GLUCAP 136* 125* 104* 130* 86   Lipid Profile: No results for input(s): CHOL, HDL, LDLCALC, TRIG, CHOLHDL, LDLDIRECT in the last 72 hours. Thyroid Function Tests: Recent Labs    02/23/20 0933  TSH 1.806  FREET4 0.94   Anemia Panel: No results for input(s): VITAMINB12, FOLATE, FERRITIN, TIBC, IRON, RETICCTPCT in the last 72 hours. Sepsis Labs: No results for input(s): PROCALCITON, LATICACIDVEN in the last 168 hours.  Recent Results (from the past 240 hour(s))  Culture, respiratory (non-expectorated)     Status: None   Collection Time: 02/20/20 12:16 PM   Specimen: Tracheal Aspirate; Respiratory  Result Value Ref Range Status   Specimen Description TRACHEAL ASPIRATE  Final   Special Requests Normal  Final   Gram Stain   Final    NO WBC SEEN NO ORGANISMS SEEN Performed at [facility], [address] [place],  [place] [address]    Culture RARE CANDIDA ALBICANS RARE SERRATIA MARCESCENS   Final   Report Status 02/22/2020 FINAL  Final   Organism ID, Bacteria SERRATIA MARCESCENS  Final      Susceptibility   Serratia marcescens - MIC*    CEFAZOLIN >=64 RESISTANT Resistant     CEFEPIME <=0.12 SENSITIVE Sensitive     CEFTAZIDIME <=1 SENSITIVE Sensitive     CEFTRIAXONE <=0.25 SENSITIVE Sensitive     CIPROFLOXACIN <=0.25 SENSITIVE Sensitive     GENTAMICIN <=1 SENSITIVE Sensitive     TRIMETH/SULFA <=20 SENSITIVE Sensitive     * RARE SERRATIA MARCESCENS         Radiology Studies: No results found.      Scheduled Meds: . apixaban  5 mg Oral BID  . bethanechol  5 mg Oral TID  . Chlorhexidine Gluconate Cloth  6 each Topical Daily  . feeding supplement (ENSURE ENLIVE)  237 mL Oral TID BM  . feeding supplement (OSMOLITE 1.2 CAL)  840 mL Per Tube Q24H  . folic acid  1 mg Per Tube Daily  . Gerhardt's butt cream   Topical TID  . guaiFENesin  10 mL Per Tube Q6H  . hydrALAZINE  25 mg Oral Q8H  . insulin aspart  0-9 Units Subcutaneous Q4H  . ipratropium  0.5 mg Nebulization BID  . levalbuterol  0.63 mg Nebulization BID  . mouth rinse  15 mL Mouth Rinse BID  . multivitamin with minerals  1 tablet Per Tube Daily  . nicotine  21 mg Transdermal Daily  . pantoprazole sodium  40 mg Per Tube BID  . polyethylene glycol  17 g Oral Daily  . thiamine  100 mg Per Tube Daily   Continuous Infusions: . sodium chloride Stopped (02/16/20 1146)     LOS: 13 days        Hosie Poisson, MD [facility]   To contact the attending provider between 7A-7P or the covering provider during after hours 7P-7A, please log into the web site [email or web address] and access using universal [place] password for that web site. If you do not have the password, please call the hospital operator.  02/25/2020, 4:21 PM

## 2020-02-25 NOTE — Progress Notes (Signed)
Occupational Therapy Treatment Patient Details Name: Dawn Beasley MRN: [id number] DOB: 1951/03/14 Today's Date: 02/25/2020    History of present illness Pt adm with acute hypoxemic respiratory failure due to aspiration PNA. Intubated 3/27-3/29. Transferred out of ICU 3/30 and back into ICU on 4/1 with respiratory distress and placed on bipap.    PMH - HTN, etoh abuse, opioid abuse, spinal stenosis, copd, AAA   OT comments  Pt. Seen for skilled OT treatment session.  rn present for session and provided x2 assistance during transfer to/from bsc and back to bed.  Bed mobility min a with on person assistance.  Will continue to progress with ADLs next session as pt. Able to tolerate.   Follow Up Recommendations  CIR;Supervision/Assistance - 24 hour    Equipment Recommendations       Recommendations for Other Services      Precautions / Restrictions Precautions Precautions: Fall Precaution Comments: watch resp status       Mobility Bed Mobility Overal bed mobility: Needs Assistance Bed Mobility: Rolling;Sidelying to Sit Rolling: Min assist Sidelying to sit: Min assist       General bed mobility comments: pt rolled over onto R elbow well then needed truncal assist to come fully upright.  Transfers Overall transfer level: Needs assistance Equipment used: 2 person hand held assist Transfers: Squat Pivot Transfers Sit to Stand: Min assist;+2 physical assistance;+2 safety/equipment Stand pivot transfers: Min assist;+2 physical assistance;+2 safety/equipment       General transfer comment: pt. able to follow instructional and tactile cues to reach for arm rest on bsc to initiate pivot and then reach for bed with R ue for back to bed for successful transfers to/from bed to bsc    Balance                                           ADL either performed or assessed with clinical judgement   ADL Overall ADL's : Needs assistance/impaired                          Toilet Transfer: +2 for safety/equipment;Stand-pivot;BSC;+2 for physical assistance;Moderate assistance   Toileting- Clothing Manipulation and Hygiene: +2 for physical assistance;+2 for safety/equipment;Maximal assistance       Functional mobility during ADLs: Moderate assistance;+2 for physical assistance;+2 for safety/equipment General ADL Comments: mod a x2 for physical assistance and safety with multiple tubes/lines.  assisted back to bed after bm on bsc as rn was concerned with increased respiration rate     Vision       Perception     Praxis      Cognition Arousal/Alertness: Awake/alert Behavior During Therapy: WFL for tasks assessed/performed   Area of Impairment: Memory;Safety/judgement;Problem solving;Following commands;Attention                   Current Attention Level: Sustained Memory: Decreased recall of precautions;Decreased short-term memory Following Commands: Follows one step commands with increased time;Follows one step commands inconsistently Safety/Judgement: Decreased awareness of safety;Decreased awareness of deficits Awareness: Intellectual Problem Solving: Requires verbal cues;Requires tactile cues;Slow processing;Decreased initiation General Comments: when son asked "mom do you want them to help you try and go to the b.room" she laughed and said "how do i know i cant tell".  also stated birth year as her age        Exercises  Shoulder Instructions       General Comments      Pertinent Vitals/ Pain       Pain Assessment: No/denies pain  Home Living                                          Prior Functioning/Environment              Frequency  Min 2X/week        Progress Toward Goals  OT Goals(current goals can now be found in the care plan section)  Progress towards OT goals: Progressing toward goals     Plan      Co-evaluation                 AM-PAC OT "6 Clicks" Daily  Activity     Outcome Measure   Help from another person eating meals?: Total Help from another person taking care of personal grooming?: A Little Help from another person toileting, which includes using toliet, bedpan, or urinal?: A Lot Help from another person bathing (including washing, rinsing, drying)?: A Lot Help from another person to put on and taking off regular upper body clothing?: A Lot Help from another person to put on and taking off regular lower body clothing?: A Lot 6 Click Score: 12    End of Session    OT Visit Diagnosis: Unsteadiness on feet (R26.81);Other abnormalities of gait and mobility (R26.89);Muscle weakness (generalized) (M62.81);Other symptoms and signs involving cognitive function   Activity Tolerance Patient limited by fatigue;Other (comment)(increased respirations noted during toileting and rn wanted pt. back to bed and assisted with transfer back to bed.)   Patient Left in bed;with call bell/phone within reach;with bed alarm set;with nursing/sitter in room   Nurse Communication          Time: [phone] OT Time Calculation (min): 20 min  Charges: OT General Charges $OT Visit: 1 Visit OT Treatments $Self Care/Home Management : 8-22 mins  Sonia Baller, COTA/L Acute Rehabilitation [phone]   Janice Coffin 02/25/2020, 12:28 PM

## 2020-02-26 ENCOUNTER — Inpatient Hospital Stay (HOSPITAL_COMMUNITY): Payer: PPO

## 2020-02-26 DIAGNOSIS — J9621 Acute and chronic respiratory failure with hypoxia: Secondary | ICD-10-CM | POA: Diagnosis not present

## 2020-02-26 DIAGNOSIS — N179 Acute kidney failure, unspecified: Secondary | ICD-10-CM | POA: Diagnosis not present

## 2020-02-26 DIAGNOSIS — R410 Disorientation, unspecified: Secondary | ICD-10-CM | POA: Diagnosis not present

## 2020-02-26 DIAGNOSIS — J690 Pneumonitis due to inhalation of food and vomit: Secondary | ICD-10-CM | POA: Diagnosis not present

## 2020-02-26 LAB — BASIC METABOLIC PANEL
Anion gap: 11 (ref 5–15)
BUN: 8 mg/dL (ref 8–23)
CO2: 32 mmol/L (ref 22–32)
Calcium: 8.3 mg/dL — ABNORMAL LOW (ref 8.9–10.3)
Chloride: 96 mmol/L — ABNORMAL LOW (ref 98–111)
Creatinine, Ser: 0.35 mg/dL — ABNORMAL LOW (ref 0.44–1.00)
GFR calc Af Amer: >60 mL/min (ref >60)
GFR calc non Af Amer: >60 mL/min (ref >60)
Glucose, Bld: 127 mg/dL — ABNORMAL HIGH (ref 70–99)
Potassium: 3.5 mmol/L (ref 3.5–5.1)
Sodium: 139 mmol/L (ref 135–145)

## 2020-02-26 LAB — CBC WITH DIFFERENTIAL/PLATELET
Abs Immature Granulocytes: 0.03 10*3/uL (ref 0.00–0.07)
Basophils Absolute: 0 10*3/uL (ref 0.0–0.1)
Basophils Relative: 0 %
Eosinophils Absolute: 0.1 10*3/uL (ref 0.0–0.5)
Eosinophils Relative: 1 %
HCT: 24.9 % — ABNORMAL LOW (ref 36.0–46.0)
Hemoglobin: 8.1 g/dL — ABNORMAL LOW (ref 12.0–15.0)
Immature Granulocytes: 0 %
Lymphocytes Relative: 6 %
Lymphs Abs: 0.5 10*3/uL — ABNORMAL LOW (ref 0.7–4.0)
MCH: 38.9 pg — ABNORMAL HIGH (ref 26.0–34.0)
MCHC: 32.5 g/dL (ref 30.0–36.0)
MCV: 119.7 fL — ABNORMAL HIGH (ref 80.0–100.0)
Monocytes Absolute: 0.5 10*3/uL (ref 0.1–1.0)
Monocytes Relative: 7 %
Neutro Abs: 6.7 10*3/uL (ref 1.7–7.7)
Neutrophils Relative %: 86 %
Platelets: 230 10*3/uL (ref 150–400)
RBC: 2.08 MIL/uL — ABNORMAL LOW (ref 3.87–5.11)
RDW: 14 % (ref 11.5–15.5)
WBC: 7.8 10*3/uL (ref 4.0–10.5)
nRBC: 0 % (ref 0.0–0.2)

## 2020-02-26 LAB — GLUCOSE, CAPILLARY
Glucose-Capillary: 113 mg/dL — ABNORMAL HIGH (ref 70–99)
Glucose-Capillary: 85 mg/dL (ref 70–99)
Glucose-Capillary: 88 mg/dL (ref 70–99)
Glucose-Capillary: 89 mg/dL (ref 70–99)
Glucose-Capillary: 93 mg/dL (ref 70–99)
Glucose-Capillary: 99 mg/dL (ref 70–99)

## 2020-02-26 MED ORDER — SODIUM CHLORIDE 0.9 % IV SOLN: INTRAVENOUS | Status: DC

## 2020-02-26 MED ORDER — FUROSEMIDE 10 MG/ML IJ SOLN
20.0000 mg | Freq: Two times a day (BID) | INTRAMUSCULAR | Status: DC
  Administered 2020-02-26 – 2020-02-27 (×2): 20 mg via INTRAVENOUS
  Filled 2020-02-26 (×2): qty 2

## 2020-02-26 MED ORDER — ZOLPIDEM TARTRATE 5 MG PO TABS
5.0000 mg | ORAL_TABLET | Freq: Every evening | ORAL | Status: DC | PRN
  Administered 2020-02-26 – 2020-03-01 (×4): 5 mg via ORAL
  Filled 2020-02-26 (×5): qty 1

## 2020-02-26 MED ORDER — FUROSEMIDE 10 MG/ML IJ SOLN
40.0000 mg | Freq: Once | INTRAMUSCULAR | Status: AC
  Administered 2020-02-26: 40 mg via INTRAVENOUS
  Filled 2020-02-26: qty 4

## 2020-02-26 NOTE — Progress Notes (Signed)
Patient is having substernal retractions, intercostal retractions, high blood pressure, tachycardic, and tachypnea. Administered breathing treatment with no change in status, still WOB, and SOB.Marland Kitchen

## 2020-02-26 NOTE — Progress Notes (Signed)
PROGRESS NOTE    Dawn Beasley  UEA:[id number] DOB: Oct 14, 1951 DOA: 02/11/2020 PCP: Cyndi Bender, PA-C   Brief Narrative:  69 year old female prior history of COPD, hypertension, chronic gastric dysmotility, chronic emesis developed a right-sided aspiration pneumonia intubated on 02/11/2020 and extubated on 02/13/2020.  She was initially transferred out of the ICU on 02/14/2020 but was back in the ICU on 02/16/2020 for worsening agitation.  CT of the head on 3/431 does not show any acute intracranial abnormality.  He was also found to have right lower lobe segmental pulmonary embolism.  Subsequent echocardiogram does not show any right heart strain.  She had prolonged ICU stay due to acute metabolic encephalopathy.  Currently she is alert and oriented and has been transferred out of ICU to [facility] service on 02/24/2020. Therapy evaluations recommending CIR.  CIR evaluation pending at this time. Urinary retention requiring 2 times of in and out cath, foley catheter was placed . Overnight patient had respiratory distress and chest x-ray showed vascular congestion requiring one dose of IV Lasix patient diuresed about 4500 mL of urine in the last 24 hours. Her breathing has improved and she is on 3 L of nasal cannula oxygen with good sats. She continues to be tachypneic.   Assessment & Plan:   Active Problems:   Essential hypertension   Pancytopenia (HCC)   Sepsis ([place])   Aspiration pneumonia ([place])   Acute on chronic respiratory failure with hypoxia (HCC)   Hyponatremia   AKI (acute kidney injury) ([place])   Dehydration   Rhabdomyolysis   Elevated troponin   Respiratory failure (HCC)   Multiple subsegmental pulmonary emboli without acute cor pulmonale (HCC)   Delirium   Acute hypoxic respiratory failure Multifactorial probably from aspiration pneumonia right lower lobe segmental pulmonary embolism, COPD exacerbation, pulmonary edema, benzo withdrawal. Mild acute on chronic diastolic heart failure She  was initially intubated on 3/27 and successfully extubated on 02/13/2020.  Patient is currently requiring up to 3 to 4 L of nasal cannula oxygen to keep sats greater than 90%.   Patient has completed a total of 10 days of IV antibiotics at this time. Echocardiogram shows normal RV systolic function and hyperdynamic left ventricular ejection fraction of 65 to 70% the patient continues to be tachypneic.  Overnight patient became distressed and chest x-ray showed vascular congestion. Her breathing has improved and she diuresed appropriately after one dose of Lasix.   Acute metabolic encephalopathy Resolved probably secondary to a combination of benzodiazepine withdrawal, metabolic derangements, sundowning, hospital delirium. Continue with Xanax and oxycodone at this time.  Alert and oriented.    Acute kidney injury s/p bladder outlet obstruction S/p In-N-Out cath twice.  Patient also reports lower abdominal wall discomfort. Foley catheter placed Continue with Urecholine 5 mg 3 times daily    Right lower lobe segmental pulmonary embolism suspected incidental finding.   Lower extremity Dopplers are negative.   Continue with Eliquis at this time and wean her off the oxygen as appropriate. Patient continues to be tachypneic and appears very deconditioned.    Mild acute on chronic diastolic heart failure Chest x-ray showed vascular congestion, Diuresing well with Lasix. Repeat Lasix 20 mg ordered this evening. Foley catheter placed for urinary retention and IV diuresis.  Continue with strict intake and output and daily weights. Monitor renal parameters while on Lasix.  Essential hypertension Well-controlled blood pressure parameters  Hypokalemia Replaced, repeat in the morning.    Anemia of chronic disease/iron deficiency anemia Hemoglobin around 8. Transfuse to  keep hemoglobin greater than 7    History of chronic dysmotility, gastropathy and emesis. GERD S/p endoscopy and  colonoscopy on 02/06/2020 showing colitis and reactive gastropathy. Continue with Protonix 40 mg twice daily Continue with the bowel regimen and follow-up with gastroenterology in about 6 weeks.   Protein calorie malnutrition Speech evaluation recommending dysphagia 3 diet but she was also started on tube feeds at nights via core track to supplement nutrition.   DVT prophylaxis: Eliquis Code Status: Full code Family Communication: Family at bedside Disposition Plan:  . Patient came from:  Home          . Anticipated d/c place: CIR versus SNF Barriers to d/c OR conditions which need to be met to effect a safe d/c: Pending CIR evaluation  Consultants:   PCCM  Procedures:  Antimicrobials: None Subjective: Patient reports not able to sleep at night, trazodone did not help. We will replace it with Ambien 5 mg as needed nightly. She reports breathing better when compared to yesterday. Objective: Vitals:   02/26/20 0600 02/26/20 0753 02/26/20 0800 02/26/20 0808  BP:  (!) 141/73 (!) 150/71   Pulse: 94 (!) 110 (!) 113   Resp: (!) 26 (!) 36 (!) 39   Temp:  98.9 F (37.2 C)    TempSrc:  Axillary    SpO2: 100% 100% 97% 95%  Weight:      Height:        Intake/Output Summary (Last 24 hours) at 02/26/2020 1029 Last data filed at 02/26/2020 0611 Gross per 24 hour  Intake --  Output 4500 ml  Net -4500 ml   Filed Weights   02/21/20 0500 02/22/20 0406 02/23/20 0357  Weight: 56.9 kg 54.9 kg 55 kg    Examination:  General exam: Alert, slightly tachypneic on 3 L of nasal cannula oxygen, not in any kind of distress. Respiratory system: Air entry fair, no wheezing or rhonchi, tachypnea still persistent but has improved when compared to yesterday. Cardiovascular system: S1-S2 heard, tachycardic, no JVD, no pedal edema Gastrointestinal system: Abdomen is soft, nontender, nondistended bowel sounds are normal Central nervous system: Alert and oriented, grossly nonfocal Extremities: No  pedal edema Skin: No rashes Psychiatry: Anxious   Data Reviewed: I have personally reviewed following labs and imaging studies  CBC: Recent Labs  Lab 02/21/20 0257 02/22/20 0556 02/23/20 0933 02/24/20 0937 02/25/20 1237  WBC 33.2* 29.5* 19.7* 11.9* 10.2  NEUTROABS 32.5* 28.1* 18.4* 10.8* 9.1*  HGB 10.9* 9.8* 8.3* 8.2* 7.9*  HCT 33.6* 30.0* 25.8* 24.8* 24.4*  MCV 121.7* 121.0* 122.9* 119.8* 119.0*  PLT 406* 283 260 245 627   Basic Metabolic Panel: Recent Labs  Lab 02/20/20 0230 02/20/20 0230 02/21/20 0257 02/22/20 0556 02/23/20 0933 02/24/20 0937 02/25/20 1237  NA 145   < > 145 143 145 142 140  K 4.4   < > 3.6 4.2 3.1* 3.5 3.6  CL 103   < > 97* 99 98 97* 99  CO2 34*   < > 36* 29 35* 33* 33*  GLUCOSE 166*   < > 138* 99 127* 113* 108*  BUN 14   < > '11 19 12 12 10  ' CREATININE 0.46   < > 0.55 0.91 0.55 0.45 0.41*  CALCIUM 8.0*   < > 8.7* 8.4* 8.5* 8.6* 8.2*  PHOS 2.7  --   --   --   --   --   --    < > = values in this interval not displayed.  GFR: Estimated Creatinine Clearance: 54.7 mL/min (A) (by C-G formula based on SCr of 0.41 mg/dL (L)). Liver Function Tests: Recent Labs  Lab 02/20/20 0230  AST 19  ALT 28  ALKPHOS 80  BILITOT 0.2*  PROT 5.7*  ALBUMIN 2.7*   No results for input(s): LIPASE, AMYLASE in the last 168 hours. No results for input(s): AMMONIA in the last 168 hours. Coagulation Profile: No results for input(s): INR, PROTIME in the last 168 hours. Cardiac Enzymes: No results for input(s): CKTOTAL, CKMB, CKMBINDEX, TROPONINI in the last 168 hours. BNP (last 3 results) No results for input(s): PROBNP in the last 8760 hours. HbA1C: No results for input(s): HGBA1C in the last 72 hours. CBG: Recent Labs  Lab 02/25/20 1601 02/25/20 2010 02/26/20 0025 02/26/20 0405 02/26/20 0820  GLUCAP 86 115* 99 93 85   Lipid Profile: No results for input(s): CHOL, HDL, LDLCALC, TRIG, CHOLHDL, LDLDIRECT in the last 72 hours. Thyroid Function  Tests: No results for input(s): TSH, T4TOTAL, FREET4, T3FREE, THYROIDAB in the last 72 hours. Anemia Panel: No results for input(s): VITAMINB12, FOLATE, FERRITIN, TIBC, IRON, RETICCTPCT in the last 72 hours. Sepsis Labs: No results for input(s): PROCALCITON, LATICACIDVEN in the last 168 hours.  Recent Results (from the past 240 hour(s))  Culture, respiratory (non-expectorated)     Status: None   Collection Time: 02/20/20 12:16 PM   Specimen: Tracheal Aspirate; Respiratory  Result Value Ref Range Status   Specimen Description TRACHEAL ASPIRATE  Final   Special Requests Normal  Final   Gram Stain   Final    NO WBC SEEN NO ORGANISMS SEEN Performed at [facility], [address] [place], [place] [address]    Culture RARE CANDIDA ALBICANS RARE SERRATIA MARCESCENS   Final   Report Status 02/22/2020 FINAL  Final   Organism ID, Bacteria SERRATIA MARCESCENS  Final      Susceptibility   Serratia marcescens - MIC*    CEFAZOLIN >=64 RESISTANT Resistant     CEFEPIME <=0.12 SENSITIVE Sensitive     CEFTAZIDIME <=1 SENSITIVE Sensitive     CEFTRIAXONE <=0.25 SENSITIVE Sensitive     CIPROFLOXACIN <=0.25 SENSITIVE Sensitive     GENTAMICIN <=1 SENSITIVE Sensitive     TRIMETH/SULFA <=20 SENSITIVE Sensitive     * RARE SERRATIA MARCESCENS         Radiology Studies: DG CHEST PORT 1 VIEW  Result Date: 02/26/2020 CLINICAL DATA:  Short of breath EXAM: PORTABLE CHEST 1 VIEW COMPARISON:  02/22/2020 FINDINGS: Feeding tube unchanged. Normal cardiac silhouette. Pleural thickening fluid along the RIGHT horizontal fissure unchanged. Increase in central venous congestion. LEFT basilar atelectasis and effusion. IMPRESSION: 1. Mild increase in central venous congestion. 2. Bilateral pleural effusions and basilar atelectasis. Electronically Signed   By: Suzy Bouchard M.D.   On: 02/26/2020 04:42   DG Abd 2 Views  Result Date: 02/25/2020 CLINICAL DATA:  Abdominal distension EXAM: ABDOMEN - 2 VIEW  COMPARISON:  02/12/2020 FINDINGS: Feeding tube coiled in the stomach. Spinal nerve stimulator projecting over the lower thoracic spine. Cardiomediastinal contours remain mildly enlarged. Perifissural consolidation in the right chest is increased along with increased interstitial markings. Further consolidation at the left lung base with obscured left hemidiaphragm and left pleural effusion. Blunting of right costodiaphragmatic sulcus as well. No free air beneath either right or left hemidiaphragm. Thoracic skeletal structures are unremarkable. Small amount of gas in the stomach. Mildly distended small bowel loops in the abdomen without clear signs of obstruction. Gas in the  colon. Small amount of gas likely in the rectum. No acute bone process. IMPRESSION: 1. Persistent areas of airspace disease in the chest, nonspecific likely with superimposed pulmonary edema. 2. Bilateral effusions as before. 3. Feeding tube coiled in the stomach. 4. Findings of may reflect mild ileus. Electronically Signed   By: Zetta Bills M.D.   On: 02/25/2020 18:29        Scheduled Meds: . apixaban  5 mg Oral BID  . bethanechol  5 mg Oral TID  . Chlorhexidine Gluconate Cloth  6 each Topical Daily  . feeding supplement (ENSURE ENLIVE)  237 mL Oral TID BM  . feeding supplement (OSMOLITE 1.2 CAL)  840 mL Per Tube Q24H  . folic acid  1 mg Per Tube Daily  . Gerhardt's butt cream   Topical TID  . guaiFENesin  10 mL Per Tube Q6H  . hydrALAZINE  25 mg Oral Q8H  . insulin aspart  0-9 Units Subcutaneous Q4H  . ipratropium  0.5 mg Nebulization BID  . levalbuterol  0.63 mg Nebulization BID  . mouth rinse  15 mL Mouth Rinse BID  . multivitamin with minerals  1 tablet Per Tube Daily  . nicotine  21 mg Transdermal Daily  . pantoprazole sodium  40 mg Per Tube BID  . polyethylene glycol  17 g Oral Daily  . thiamine  100 mg Per Tube Daily   Continuous Infusions: . sodium chloride Stopped (02/16/20 1146)     LOS: 14 days         Hosie Poisson, MD [facility]   To contact the attending provider between 7A-7P or the covering provider during after hours 7P-7A, please log into the web site [email or web address] and access using universal [place] password for that web site. If you do not have the password, please call the hospital operator.  02/26/2020, 10:29 AM

## 2020-02-26 NOTE — Progress Notes (Signed)
OVERNIGHT EVENTS:   Around 2330 noticed patient HR increasing to 110's. Pt stating she is feeling anxious because she cannot sleep and keeps looking at the clock. MEWS score red. Notified Consulting civil engineer and APP. Xanax given with some relief.   Around 0330 pt WOB increasing, labored, tachypneic and continuing to be tachycardic now in the 120's. BP elevated. Breathing treatments given by respiratory. APP notified of acute change. STAT chest xray obtained. Lasix ordered and given with improvement.

## 2020-02-27 ENCOUNTER — Inpatient Hospital Stay (HOSPITAL_COMMUNITY): Payer: PPO

## 2020-02-27 DIAGNOSIS — J9621 Acute and chronic respiratory failure with hypoxia: Secondary | ICD-10-CM | POA: Diagnosis not present

## 2020-02-27 DIAGNOSIS — R410 Disorientation, unspecified: Secondary | ICD-10-CM | POA: Diagnosis not present

## 2020-02-27 DIAGNOSIS — N179 Acute kidney failure, unspecified: Secondary | ICD-10-CM | POA: Diagnosis not present

## 2020-02-27 DIAGNOSIS — J690 Pneumonitis due to inhalation of food and vomit: Secondary | ICD-10-CM | POA: Diagnosis not present

## 2020-02-27 LAB — BASIC METABOLIC PANEL
Anion gap: 8 (ref 5–15)
BUN: 8 mg/dL (ref 8–23)
CO2: 40 mmol/L — ABNORMAL HIGH (ref 22–32)
Calcium: 8.1 mg/dL — ABNORMAL LOW (ref 8.9–10.3)
Chloride: 92 mmol/L — ABNORMAL LOW (ref 98–111)
Creatinine, Ser: 0.44 mg/dL (ref 0.44–1.00)
GFR calc Af Amer: >60 mL/min (ref >60)
GFR calc non Af Amer: >60 mL/min (ref >60)
Glucose, Bld: 139 mg/dL — ABNORMAL HIGH (ref 70–99)
Potassium: 3.1 mmol/L — ABNORMAL LOW (ref 3.5–5.1)
Sodium: 140 mmol/L (ref 135–145)

## 2020-02-27 LAB — GLUCOSE, CAPILLARY
Glucose-Capillary: 113 mg/dL — ABNORMAL HIGH (ref 70–99)
Glucose-Capillary: 134 mg/dL — ABNORMAL HIGH (ref 70–99)
Glucose-Capillary: 138 mg/dL — ABNORMAL HIGH (ref 70–99)
Glucose-Capillary: 157 mg/dL — ABNORMAL HIGH (ref 70–99)
Glucose-Capillary: 182 mg/dL — ABNORMAL HIGH (ref 70–99)
Glucose-Capillary: 86 mg/dL (ref 70–99)
Glucose-Capillary: 90 mg/dL (ref 70–99)

## 2020-02-27 LAB — MAGNESIUM: Magnesium: 1.5 mg/dL — ABNORMAL LOW (ref 1.7–2.4)

## 2020-02-27 MED ORDER — POTASSIUM CHLORIDE CRYS ER 20 MEQ PO TBCR
40.0000 meq | EXTENDED_RELEASE_TABLET | Freq: Two times a day (BID) | ORAL | Status: AC
  Administered 2020-02-27 (×2): 40 meq via ORAL
  Filled 2020-02-27 (×2): qty 2

## 2020-02-27 MED ORDER — FUROSEMIDE 10 MG/ML IJ SOLN
20.0000 mg | Freq: Every day | INTRAMUSCULAR | Status: DC
  Administered 2020-02-28 – 2020-03-02 (×4): 20 mg via INTRAVENOUS
  Filled 2020-02-27 (×4): qty 2

## 2020-02-27 NOTE — TOC Initial Note (Signed)
Transition of Care [facility]) - Initial/Assessment Note    Patient Details  Name: Dawn Beasley MRN: [id number] Date of Birth: [birth year]  Transition of Care [facility]) CM/SW Contact:    Beckie Busing, RN Phone Number: 02/27/2020, 2:28 PM  Clinical Narrative:    Patient OOB sitting in chair with husband at bedside. Patient reports that she is hopeful that she will receive inpatient rehab bed within the next 2 days. Patient is agreeable to looking over the list for other facilities. CM provided list, patient and husband want time to review and will make a decision if it is ok to fax information out for a bed search. Patient reports that she has no DME at home. Currently lives in a single story home where her husband is her support person. Will continue to follow.                 Expected Discharge Plan: IP Rehab Facility Barriers to Discharge: Continued Medical Work up   Patient Goals and CMS Choice Patient states their goals for this hospitalization and ongoing recovery are:: to go home CMS Medicare.gov Compare Post Acute Care list provided to:: Patient Choice offered to / list presented to : Patient  Expected Discharge Plan and Services Expected Discharge Plan: IP Rehab Facility   Discharge Planning Services: CM Consult Post Acute Care Choice: Skilled Nursing Facility Living arrangements for the past 2 months: Single Family Home(with husband)                                      Prior Living Arrangements/Services Living arrangements for the past 2 months: Single Family Home(with husband) Lives with:: Spouse Patient language and need for interpreter reviewed:: Yes        Need for Family Participation in Patient Care: Yes (Comment) Care giver support system in place?: Yes (comment)   Criminal Activity/Legal Involvement Pertinent to Current Situation/Hospitalization: No - Comment as needed  Activities of Daily Living      Permission Sought/Granted                   Emotional Assessment Appearance:: Appears stated age Attitude/Demeanor/Rapport: Gracious Affect (typically observed): Calm Orientation: : Oriented to Self, Oriented to Place, Oriented to  Time Alcohol / Substance Use: Not Applicable Psych Involvement: No (comment)  Admission diagnosis:  Respiratory failure (HCC) [J96.90] Vomiting [R11.10] Hoarseness of voice [R49.0] Acute respiratory failure with hypoxia (HCC) [J96.01] AKI (acute kidney injury) (HCC) [N17.9] Near syncope [R55] Pancytopenia (HCC) [D61.818] Sepsis (HCC) [A41.9] Acute on chronic respiratory failure (HCC) [J96.20] Patient Active Problem List   Diagnosis Date Noted  . Multiple subsegmental pulmonary emboli without acute cor pulmonale (HCC)   . Delirium   . Pancytopenia (HCC) 02/12/2020  . Sepsis (HCC) 02/12/2020  . Aspiration pneumonia (HCC) 02/12/2020  . Acute on chronic respiratory failure with hypoxia (HCC) 02/12/2020  . Hyponatremia 02/12/2020  . AKI (acute kidney injury) (HCC) 02/12/2020  . Dehydration 02/12/2020  . Rhabdomyolysis 02/12/2020  . Elevated troponin 02/12/2020  . Respiratory failure (HCC) 02/12/2020  . Abnormal CT scan, colon 02/06/2020  . Nausea and vomiting 02/06/2020  . Essential hypertension   . 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 Encephalopathy acute 12/23/2016  . Transient alteration of awareness 12/23/2016  . Acute hyponatremia   . Speech disturbance   . Benzodiazepine withdrawal with complication (HCC)   . Other chronic pain 12/06/2015  . Lumbar spinal  stenosis 04/12/2014   PCP:  Cyndi Bender, PA-C Pharmacy:   [id number] - [place] [place] [address] Phone: [phone] Fax: [phone]  [facility] [place] [place] - [address] [place] [address] Phone: [phone] Fax: [phone]     Social Determinants of Health (SDOH)  Interventions    Readmission Risk Interventions No flowsheet data found.

## 2020-02-27 NOTE — Progress Notes (Signed)
Inpatient Rehab Admissions Coordinator:   I do not have a bed available for this patient today.  Will continue to follow for timing of potential admission pending bed availability and insurance authorization.   Estill Dooms, PT, DPT Admissions Coordinator [phone] 02/27/20  11:13 AM

## 2020-02-27 NOTE — Progress Notes (Signed)
Physical Therapy Treatment Patient Details Name: Dawn Beasley MRN: [id number] DOB: 1951/09/10 Today's Date: 02/27/2020    History of Present Illness Pt adm with acute hypoxemic respiratory failure due to aspiration PNA. Intubated 3/27-3/29. Transferred out of ICU 3/30 and back into ICU on 4/1 with respiratory distress and placed on bipap.    PMH - HTN, etoh abuse, opioid abuse, spinal stenosis, copd, AAA    PT Comments    Pt was agreeable to participation without encouragement.  Noticeably fatigued, worried that nothing had happened over the w/e, eager to get up.  Emphasis on transition, sit to stand, gait with the RW.   Follow Up Recommendations  CIR;Supervision/Assistance - 24 hour     Equipment Recommendations  Other (comment)    Recommendations for Other Services       Precautions / Restrictions Precautions Precautions: Fall    Mobility  Bed Mobility Overal bed mobility: Needs Assistance Bed Mobility: Supine to Sit   Sidelying to sit: Min assist Supine to sit: HOB elevated     General bed mobility comments: up via R elbow, assisted by raised HOB, slow and mildly effortful.  Transfers Overall transfer level: Needs assistance   Transfers: Sit to/from Stand Sit to Stand: Min assist;+2 safety/equipment         General transfer comment: consistently needing cues for transfer safety/hand placement  Ambulation/Gait Ambulation/Gait assistance: Min assist Gait Distance (Feet): 50 Feet(then 42 feet with RW,)     Gait velocity: decr Gait velocity interpretation: <1.8 ft/sec, indicate of risk for recurrent falls General Gait Details: mild unsteadiness, continues with shorter step length, noticeably fatiguing,  HR rising from 110's at rest to 143 bpm during gait trials.  Sats on 3L maintained at 98% or better.   Stairs             Wheelchair Mobility    Modified Rankin (Stroke Patients Only)       Balance Overall balance assessment: Needs assistance   Sitting balance-Leahy Scale: Fair       Standing balance-Leahy Scale: Poor Standing balance comment: reliant on AD and external support                            Cognition Arousal/Alertness: Awake/alert Behavior During Therapy: WFL for tasks assessed/performed Overall Cognitive Status: (NT formally, but husband states definitely not baseline.)                                        Exercises      General Comments General comments (skin integrity, edema, etc.): Husband and son participated.      Pertinent Vitals/Pain Pain Assessment: Faces Faces Pain Scale: Hurts little more Pain Location: back Pain Descriptors / Indicators: Discomfort;Grimacing;Guarding Pain Intervention(s): Monitored during session    Home Living                      Prior Function            PT Goals (current goals can now be found in the care plan section) Acute Rehab PT Goals Patient Stated Goal: return home PT Goal Formulation: With patient Time For Goal Achievement: 03/03/20 Potential to Achieve Goals: Fair Progress towards PT goals: Progressing toward goals    Frequency    Min 3X/week      PT Plan Current plan remains appropriate  Co-evaluation              AM-PAC PT "6 Clicks" Mobility   Outcome Measure  Help needed turning from your back to your side while in a flat bed without using bedrails?: A Little Help needed moving from lying on your back to sitting on the side of a flat bed without using bedrails?: A Little Help needed moving to and from a bed to a chair (including a wheelchair)?: A Little Help needed standing up from a chair using your arms (e.g., wheelchair or bedside chair)?: A Little Help needed to walk in hospital room?: A Little Help needed climbing 3-5 steps with a railing? : Total 6 Click Score: 16    End of Session Equipment Utilized During Treatment: Oxygen Activity Tolerance: Patient tolerated treatment  well;Patient limited by fatigue Patient left: in chair;with call bell/phone within reach;with family/visitor present Nurse Communication: Mobility status PT Visit Diagnosis: Unsteadiness on feet (R26.81);Other abnormalities of gait and mobility (R26.89);Muscle weakness (generalized) (M62.81)     Time: 1100-1131 PT Time Calculation (min) (ACUTE ONLY): 31 min  Charges:  $Gait Training: 8-22 mins $Therapeutic Activity: 8-22 mins                     02/27/2020  Jacinto Halim., PT Acute Rehabilitation Services [phone]  (pager) [phone]  (office)   Eliseo Gum Racheal Mathurin 02/27/2020, 2:48 PM

## 2020-02-27 NOTE — Progress Notes (Signed)
PROGRESS NOTE    Dawn Beasley  DVV:[id number] DOB: 1951-09-23 DOA: 02/11/2020 PCP: Cyndi Bender, PA-C   Brief Narrative:  69 year old female prior history of COPD, hypertension, chronic gastric dysmotility, chronic emesis developed a right-sided aspiration pneumonia intubated on 02/11/2020 and extubated on 02/13/2020.  She was initially transferred out of the ICU on 02/14/2020 but was back in the ICU on 02/16/2020 for worsening agitation.  CT of the head on 3/431 does not show any acute intracranial abnormality.  He was also found to have right lower lobe segmental pulmonary embolism.  Subsequent echocardiogram does not show any right heart strain.  She had prolonged ICU stay due to acute metabolic encephalopathy.  Currently she is alert and oriented and has been transferred out of ICU to [facility] service on 02/24/2020.hospital course complicated by mild acute on chronic diastolic heart failure and she was started on IV Lasix with good diuresis.  Therapy evaluations recommending CIR.  CIR admission pending insurance authorization and bed availability Urinary retention requiring 2 times of in and out cath, foley catheter was placed aggressive diuresis and urinary retention  Assessment & Plan:   Active Problems:   Essential hypertension   Pancytopenia (HCC)   Sepsis ([place])   Aspiration pneumonia ([place])   Acute on chronic respiratory failure with hypoxia (HCC)   Hyponatremia   AKI (acute kidney injury) ([place])   Dehydration   Rhabdomyolysis   Elevated troponin   Respiratory failure (HCC)   Multiple subsegmental pulmonary emboli without acute cor pulmonale (HCC)   Delirium   Acute hypoxic respiratory failure Multifactorial probably from aspiration pneumonia right lower lobe segmental pulmonary embolism, COPD exacerbation, pulmonary edema, benzo withdrawal and Mild acute on chronic diastolic heart failure She was initially intubated on 3/27 and successfully extubated on 02/13/2020.  Patient is currently  weaned down to 2 L of nasal cannula oxygen to keep sats greater than 90%  Patient has completed a total of 10 days of IV antibiotics at this time. Echocardiogram shows normal RV systolic function and hyperdynamic left ventricular ejection fraction of 65 to 70% the patient continues to be tachypneic.  Diuresing well with IV Lasix has diuresed about 9.8 L since admission Repeat chest x-ray showed Persistent airspace opacity right upper lobe with slightly greater consolidation near the minor fissure on the right compared to 1 day prior. Persistent small left pleural effusion with left lower lobe atelectasis. Stable small right pleural effusion.   Acute metabolic encephalopathy Resolved probably secondary to a combination of benzodiazepine withdrawal, metabolic derangements, sundowning, hospital delirium. Continue with Xanax and oxycodone at this time.  Alert and oriented and able to answer all questions appropriately    Acute kidney injury s/p bladder outlet obstruction S/p In-N-Out cath twice.  Patient also reports lower abdominal wall discomfort. Foley catheter placed Continue with Urecholine 5 mg 3 times daily    Right lower lobe segmental pulmonary embolism suspected incidental finding.   Lower extremity Dopplers are negative.   Continue with Eliquis at this time and wean her off the oxygen as appropriate. Patient continues to be tachypneic but has improved.    Mild acute on chronic diastolic heart failure Chest x-ray showed vascular congestion, Diuresing well with Lasix.  Diuresed about 9.8 L since admission Continue with strict intake and output and daily weights. Monitor renal parameters while on Lasix.  Essential hypertension Well-controlled blood pressure parameters  Hypokalemia Replaced, repeat in the morning.  Technetium level    Anemia of chronic disease/iron deficiency anemia Hemoglobin around 8.  Transfuse to keep hemoglobin greater than 7    History of  chronic dysmotility, gastropathy and emesis. GERD S/p endoscopy and colonoscopy on 02/06/2020 showing colitis and reactive gastropathy. Continue with Protonix 40 mg twice daily Continue with the bowel regimen and follow-up with gastroenterology in about 6 weeks.   Protein calorie malnutrition Speech evaluation recommending dysphagia 3 diet but she was also started on tube feeds at nights via core track to supplement nutrition.   DVT prophylaxis: Eliquis Code Status: Full code Family Communication: Family at bedside Disposition Plan:  . Patient came from:  Home          . Anticipated d/c place: CIR  Barriers to d/c OR conditions which need to be met to effect a safe d/c: Pending CIR evaluation  Consultants:   PCCM  Procedures:  Antimicrobials: None Subjective: Patient reports that she had a good night sleep, no nausea vomiting or abdominal pain. She reports her appetite is improving Objective: Vitals:   02/27/20 0459 02/27/20 0720 02/27/20 0759 02/27/20 1136  BP:  134/70  (!) 113/51  Pulse:  (!) 102  (!) 109  Resp:  (!) 30  (!) 25  Temp:  99.2 F (37.3 C)  98.8 F (37.1 C)  TempSrc:  Oral  Oral  SpO2:  98% 94% 94%  Weight: 55.8 kg     Height:        Intake/Output Summary (Last 24 hours) at 02/27/2020 1515 Last data filed at 02/27/2020 0600 Gross per 24 hour  Intake 240 ml  Output 3625 ml  Net -3385 ml   Filed Weights   02/22/20 0406 02/23/20 0357 02/27/20 0459  Weight: 54.9 kg 55 kg 55.8 kg    Examination:  General exam: Alert, comfortable not in any kind of distress on 2 L of nasal cannula oxygen Respiratory system: Air entry, diminished at bases, no wheezing heard, tachypnea has improved Cardiovascular system: S1-S2 heard, tachycardic, no JVD, no pedal edema Gastrointestinal system: Abdomen is soft, nontender, nondistended, bowel sounds normal Central nervous system: Alert and oriented, grossly nonfocal Extremities: No cyanosis or clubbing Skin: No rashes  seen Psychiatry: Mood is appropriate   Data Reviewed: I have personally reviewed following labs and imaging studies  CBC: Recent Labs  Lab 02/22/20 0556 02/23/20 0933 02/24/20 0937 02/25/20 1237 02/26/20 1018  WBC 29.5* 19.7* 11.9* 10.2 7.8  NEUTROABS 28.1* 18.4* 10.8* 9.1* 6.7  HGB 9.8* 8.3* 8.2* 7.9* 8.1*  HCT 30.0* 25.8* 24.8* 24.4* 24.9*  MCV 121.0* 122.9* 119.8* 119.0* 119.7*  PLT 283 260 245 227 269   Basic Metabolic Panel: Recent Labs  Lab 02/23/20 0933 02/24/20 0937 02/25/20 1237 02/26/20 1018 02/27/20 1003  NA 145 142 140 139 140  K 3.1* 3.5 3.6 3.5 3.1*  CL 98 97* 99 96* 92*  CO2 35* 33* 33* 32 40*  GLUCOSE 127* 113* 108* 127* 139*  BUN '12 12 10 8 8  ' CREATININE 0.55 0.45 0.41* 0.35* 0.44  CALCIUM 8.5* 8.6* 8.2* 8.3* 8.1*   GFR: Estimated Creatinine Clearance: 54.7 mL/min (by C-G formula based on SCr of 0.44 mg/dL). Liver Function Tests: No results for input(s): AST, ALT, ALKPHOS, BILITOT, PROT, ALBUMIN in the last 168 hours. No results for input(s): LIPASE, AMYLASE in the last 168 hours. No results for input(s): AMMONIA in the last 168 hours. Coagulation Profile: No results for input(s): INR, PROTIME in the last 168 hours. Cardiac Enzymes: No results for input(s): CKTOTAL, CKMB, CKMBINDEX, TROPONINI in the last 168 hours. BNP (last 3  results) No results for input(s): PROBNP in the last 8760 hours. HbA1C: No results for input(s): HGBA1C in the last 72 hours. CBG: Recent Labs  Lab 02/26/20 2031 02/27/20 0011 02/27/20 0447 02/27/20 0720 02/27/20 1141  GLUCAP 89 157* 86 90 138*   Lipid Profile: No results for input(s): CHOL, HDL, LDLCALC, TRIG, CHOLHDL, LDLDIRECT in the last 72 hours. Thyroid Function Tests: No results for input(s): TSH, T4TOTAL, FREET4, T3FREE, THYROIDAB in the last 72 hours. Anemia Panel: No results for input(s): VITAMINB12, FOLATE, FERRITIN, TIBC, IRON, RETICCTPCT in the last 72 hours. Sepsis Labs: No results for input(s):  PROCALCITON, LATICACIDVEN in the last 168 hours.  Recent Results (from the past 240 hour(s))  Culture, respiratory (non-expectorated)     Status: None   Collection Time: 02/20/20 12:16 PM   Specimen: Tracheal Aspirate; Respiratory  Result Value Ref Range Status   Specimen Description TRACHEAL ASPIRATE  Final   Special Requests Normal  Final   Gram Stain   Final    NO WBC SEEN NO ORGANISMS SEEN Performed at [facility], [address] [place], [place] [address]    Culture RARE CANDIDA ALBICANS RARE SERRATIA MARCESCENS   Final   Report Status 02/22/2020 FINAL  Final   Organism ID, Bacteria SERRATIA MARCESCENS  Final      Susceptibility   Serratia marcescens - MIC*    CEFAZOLIN >=64 RESISTANT Resistant     CEFEPIME <=0.12 SENSITIVE Sensitive     CEFTAZIDIME <=1 SENSITIVE Sensitive     CEFTRIAXONE <=0.25 SENSITIVE Sensitive     CIPROFLOXACIN <=0.25 SENSITIVE Sensitive     GENTAMICIN <=1 SENSITIVE Sensitive     TRIMETH/SULFA <=20 SENSITIVE Sensitive     * RARE SERRATIA MARCESCENS         Radiology Studies: DG CHEST PORT 1 VIEW  Result Date: 02/27/2020 CLINICAL DATA:  Shortness of breath EXAM: PORTABLE CHEST 1 VIEW COMPARISON:  February 26, 2020. FINDINGS: There is persistent airspace opacity in the right upper lobe with slightly more consolidation near the right minor fissure compared to 1 day prior. There is atelectasis in the left lower lobe with small left pleural effusion. Small right pleural effusion also noted. Heart is upper normal in size with pulmonary vascularity normal. Feeding tube tip is at the level of the pylorus. Thoracic stimulator leads are in the midthoracic region, stable. There is aortic atherosclerosis. No bone lesions. IMPRESSION: Persistent airspace opacity right upper lobe with slightly greater consolidation near the minor fissure on the right compared to 1 day prior. Persistent small left pleural effusion with left lower lobe atelectasis. Stable small  right pleural effusion. Stable cardiac silhouette.  Aortic Atherosclerosis (ICD10-I70.0). Feeding tube tip at level of pylorus. Electronically Signed   By: Lowella Grip III M.D.   On: 02/27/2020 12:45   DG CHEST PORT 1 VIEW  Result Date: 02/26/2020 CLINICAL DATA:  Short of breath EXAM: PORTABLE CHEST 1 VIEW COMPARISON:  02/22/2020 FINDINGS: Feeding tube unchanged. Normal cardiac silhouette. Pleural thickening fluid along the RIGHT horizontal fissure unchanged. Increase in central venous congestion. LEFT basilar atelectasis and effusion. IMPRESSION: 1. Mild increase in central venous congestion. 2. Bilateral pleural effusions and basilar atelectasis. Electronically Signed   By: Suzy Bouchard M.D.   On: 02/26/2020 04:42   DG Abd 2 Views  Result Date: 02/25/2020 CLINICAL DATA:  Abdominal distension EXAM: ABDOMEN - 2 VIEW COMPARISON:  02/12/2020 FINDINGS: Feeding tube coiled in the stomach. Spinal nerve stimulator projecting over the lower thoracic spine. Cardiomediastinal  contours remain mildly enlarged. Perifissural consolidation in the right chest is increased along with increased interstitial markings. Further consolidation at the left lung base with obscured left hemidiaphragm and left pleural effusion. Blunting of right costodiaphragmatic sulcus as well. No free air beneath either right or left hemidiaphragm. Thoracic skeletal structures are unremarkable. Small amount of gas in the stomach. Mildly distended small bowel loops in the abdomen without clear signs of obstruction. Gas in the colon. Small amount of gas likely in the rectum. No acute bone process. IMPRESSION: 1. Persistent areas of airspace disease in the chest, nonspecific likely with superimposed pulmonary edema. 2. Bilateral effusions as before. 3. Feeding tube coiled in the stomach. 4. Findings of may reflect mild ileus. Electronically Signed   By: Zetta Bills M.D.   On: 02/25/2020 18:29        Scheduled Meds: . apixaban  5  mg Oral BID  . bethanechol  5 mg Oral TID  . Chlorhexidine Gluconate Cloth  6 each Topical Daily  . feeding supplement (ENSURE ENLIVE)  237 mL Oral TID BM  . feeding supplement (OSMOLITE 1.2 CAL)  840 mL Per Tube Q24H  . folic acid  1 mg Per Tube Daily  . furosemide  20 mg Intravenous Q12H  . Gerhardt's butt cream   Topical TID  . guaiFENesin  10 mL Per Tube Q6H  . hydrALAZINE  25 mg Oral Q8H  . insulin aspart  0-9 Units Subcutaneous Q4H  . ipratropium  0.5 mg Nebulization BID  . levalbuterol  0.63 mg Nebulization BID  . mouth rinse  15 mL Mouth Rinse BID  . multivitamin with minerals  1 tablet Per Tube Daily  . nicotine  21 mg Transdermal Daily  . pantoprazole sodium  40 mg Per Tube BID  . polyethylene glycol  17 g Oral Daily  . thiamine  100 mg Per Tube Daily   Continuous Infusions: . sodium chloride Stopped (02/16/20 1146)     LOS: 15 days        Hosie Poisson, MD [facility]   To contact the attending provider between 7A-7P or the covering provider during after hours 7P-7A, please log into the web site [email or web address] and access using universal [place] password for that web site. If you do not have the password, please call the hospital operator.  02/27/2020, 3:15 PM

## 2020-02-28 DIAGNOSIS — J690 Pneumonitis due to inhalation of food and vomit: Secondary | ICD-10-CM | POA: Diagnosis not present

## 2020-02-28 DIAGNOSIS — N179 Acute kidney failure, unspecified: Secondary | ICD-10-CM | POA: Diagnosis not present

## 2020-02-28 DIAGNOSIS — J9621 Acute and chronic respiratory failure with hypoxia: Secondary | ICD-10-CM | POA: Diagnosis not present

## 2020-02-28 DIAGNOSIS — R410 Disorientation, unspecified: Secondary | ICD-10-CM | POA: Diagnosis not present

## 2020-02-28 LAB — BASIC METABOLIC PANEL
Anion gap: 8 (ref 5–15)
BUN: 12 mg/dL (ref 8–23)
CO2: 35 mmol/L — ABNORMAL HIGH (ref 22–32)
Calcium: 8.6 mg/dL — ABNORMAL LOW (ref 8.9–10.3)
Chloride: 96 mmol/L — ABNORMAL LOW (ref 98–111)
Creatinine, Ser: 0.46 mg/dL (ref 0.44–1.00)
GFR calc Af Amer: >60 mL/min (ref >60)
GFR calc non Af Amer: >60 mL/min (ref >60)
Glucose, Bld: 149 mg/dL — ABNORMAL HIGH (ref 70–99)
Potassium: 3.5 mmol/L (ref 3.5–5.1)
Sodium: 139 mmol/L (ref 135–145)

## 2020-02-28 LAB — GLUCOSE, CAPILLARY
Glucose-Capillary: 125 mg/dL — ABNORMAL HIGH (ref 70–99)
Glucose-Capillary: 79 mg/dL (ref 70–99)
Glucose-Capillary: 94 mg/dL (ref 70–99)
Glucose-Capillary: 126 mg/dL — ABNORMAL HIGH (ref 70–99)
Glucose-Capillary: 101 mg/dL — ABNORMAL HIGH (ref 70–99)
Glucose-Capillary: 138 mg/dL — ABNORMAL HIGH (ref 70–99)

## 2020-02-28 MED ORDER — POTASSIUM CHLORIDE CRYS ER 20 MEQ PO TBCR
60.0000 meq | EXTENDED_RELEASE_TABLET | Freq: Once | ORAL | Status: AC
  Administered 2020-02-28: 60 meq via ORAL
  Filled 2020-02-28: qty 3

## 2020-02-28 MED ORDER — MAGNESIUM SULFATE 2 GM/50ML IV SOLN
2.0000 g | Freq: Once | INTRAVENOUS | Status: AC
  Administered 2020-02-28: 2 g via INTRAVENOUS
  Filled 2020-02-28: qty 50

## 2020-02-28 NOTE — Progress Notes (Signed)
PROGRESS NOTE    Dawn Beasley  PIR:[id number] DOB: [birth year] DOA: 02/11/2020 PCP: Cyndi Bender, PA-C   Brief Narrative:  69 year old female prior history of COPD, hypertension, chronic gastric dysmotility, chronic emesis developed a right-sided aspiration pneumonia intubated on 02/11/2020 and extubated on 02/13/2020.  She was initially transferred out of the ICU on 02/14/2020 but was back in the ICU on 02/16/2020 for worsening agitation.  CT of the head on 3/431 does not show any acute intracranial abnormality.  She was also found to have right lower lobe segmental pulmonary embolism and waas started on Eliquis.   Subsequent echocardiogram does not show any right heart strain.  She had prolonged ICU stay due to acute metabolic encephalopathy.  Currently she is alert and oriented and has been transferred out of ICU to [facility] service on 02/24/2020. Hospital course complicated by mild acute on chronic diastolic heart failure and she was started on IV Lasix with good diuresis.  Therapy evaluations recommending CIR.  CIR admission pending insurance authorization and bed availability Urinary retention requiring 2 times of in and out cath, foley catheter was placed for aggressive diuresis and urinary retention  Assessment & Plan:   Active Problems:   Essential hypertension   Pancytopenia (HCC)   Sepsis ([place])   Aspiration pneumonia ([place])   Acute on chronic respiratory failure with hypoxia (HCC)   Hyponatremia   AKI (acute kidney injury) ([place])   Dehydration   Rhabdomyolysis   Elevated troponin   Respiratory failure (HCC)   Multiple subsegmental pulmonary emboli without acute cor pulmonale (HCC)   Delirium   Acute hypoxic respiratory failure Multifactorial probably from aspiration pneumonia, right lower lobe segmental pulmonary embolism, COPD exacerbation, pulmonary edema, benzo withdrawal and Mild acute on chronic diastolic heart failure She was initially intubated on 3/27 and successfully extubated on  02/13/2020.  Patient is currently weaned down to 2 to 3 L of nasal cannula oxygen to keep sats greater than 90%  Patient has completed a total of 10 days of IV antibiotics at this time. Echocardiogram shows normal RV systolic function and hyperdynamic left ventricular ejection fraction of 65 to 70% the patient continues to be tachypneic.  Diuresing well with IV Lasix,  has diuresed about 12 L since admission Repeat chest x-ray showed Persistent airspace opacity right upper lobe with slightly greater consolidation near the minor fissure on the right compared to 1 day prior. Persistent small left pleural effusion with left lower lobe atelectasis. Stable small right pleural effusion. Recommend to change to oral lasix tomorrow.    Acute metabolic encephalopathy Resolved ,probably secondary to a combination of benzodiazepine withdrawal, metabolic derangements, sundowning, hospital delirium. Continue with Xanax and oxycodone at this time.  Alert and oriented and able to answer all questions appropriately.     Acute kidney injury s/p bladder outlet obstruction S/p In-N-Out cath twice on the floor, Foley catheter placed for Urinary Retention.  Continue with Urecholine 5 mg 3 times daily. Voiding trial possibly in the next 1 to 2 days after transitioning to oral lasix.     Right lower lobe segmental pulmonary embolism suspected incidental finding.   Lower extremity Dopplers are negative.   Continue with Eliquis at this time and wean her off the oxygen as appropriate. Tachypnea has improved.    Mild acute on chronic diastolic heart failure: Chest x-ray showed vascular congestion, she was started on IV lasix, .  Diuresed appropriately with IV lasix, so far neg neg 12 lit since admission.  Continue with strict  intake and output and daily weights. Monitor renal parameters while on Lasix. Replace potassium and magnesium as needed.   Essential hypertension Well-controlled blood pressure  parameters  Hypokalemia Replaced, repeat in the morning.   Hypomagnesemia:  Replaced, repeat in am.   Anemia of chronic disease/iron deficiency anemia Hemoglobin around 8. Transfuse to keep hemoglobin greater than 7    History of chronic dysmotility, gastropathy and chronic emesis and GERD S/p endoscopy and colonoscopy on 02/06/2020 showing colitis and reactive gastropathy. Continue with Protonix 40 mg twice daily Continue with the bowel regimen and follow-up with gastroenterology in about 6 weeks.   Protein calorie malnutrition Speech evaluation recommending dysphagia 3 diet but she was also started on tube feeds at nights via core track to supplement nutrition. Pt wants the cor trak out as it is giving her loose bowel movements. Decrease the rate of the nocturnal feeds to 30 ml/hr.    DVT prophylaxis: Eliquis Code Status: Full code Family Communication: Family at bedside Disposition Plan:  . Patient came from:  Home          . Anticipated d/c place: CIR  Barriers to d/c OR conditions which need to be met to effect a safe d/c: Pending CIR evaluation  Consultants:   PCCM  Procedures:  Antimicrobials: None Subjective: Reports 2 loose BM last night.  No chest pain or sob.  Objective: Vitals:   02/28/20 0335 02/28/20 0549 02/28/20 0700 02/28/20 0815  BP:  129/65 134/67   Pulse:  (!) 105 (!) 105   Resp:   20   Temp:   97.8 F (36.6 C)   TempSrc:   Oral   SpO2:  99% 96% 93%  Weight: 54.4 kg     Height:        Intake/Output Summary (Last 24 hours) at 02/28/2020 1502 Last data filed at 02/28/2020 1158 Gross per 24 hour  Intake 670 ml  Output 2200 ml  Net -1530 ml   Filed Weights   02/23/20 0357 02/27/20 0459 02/28/20 0335  Weight: 55 kg 55.8 kg 54.4 kg    Examination:  General exam: alert ,on 2 lit of [place] oxygen, no distress.  Respiratory system: air entry fair bilateral , no wheezing or rhonchi, tachypnea present.  Cardiovascular system: S1S2 HEARD, RRR  no JVD, no pedal edema.  Gastrointestinal system: abdomen is soft, non tender non distended, bowel sounds wnl.  Central nervous system: Alert and oriented, grossly non focal.  Extremities:No cyanosis or clubbing.  Skin: NO rashes seen.  Psychiatry: anxious,    Data Reviewed: I have personally reviewed following labs and imaging studies  CBC: Recent Labs  Lab 02/22/20 0556 02/23/20 0933 02/24/20 0937 02/25/20 1237 02/26/20 1018  WBC 29.5* 19.7* 11.9* 10.2 7.8  NEUTROABS 28.1* 18.4* 10.8* 9.1* 6.7  HGB 9.8* 8.3* 8.2* 7.9* 8.1*  HCT 30.0* 25.8* 24.8* 24.4* 24.9*  MCV 121.0* 122.9* 119.8* 119.0* 119.7*  PLT 283 260 245 227 704   Basic Metabolic Panel: Recent Labs  Lab 02/24/20 0937 02/25/20 1237 02/26/20 1018 02/27/20 1003 02/27/20 1730 02/28/20 0919  NA 142 140 139 140  --  139  K 3.5 3.6 3.5 3.1*  --  3.5  CL 97* 99 96* 92*  --  96*  CO2 33* 33* 32 40*  --  35*  GLUCOSE 113* 108* 127* 139*  --  149*  BUN '12 10 8 8  ' --  12  CREATININE 0.45 0.41* 0.35* 0.44  --  0.46  CALCIUM 8.6* 8.2*  8.3* 8.1*  --  8.6*  MG  --   --   --   --  1.5*  --    GFR: Estimated Creatinine Clearance: 54.7 mL/min (by C-G formula based on SCr of 0.46 mg/dL). Liver Function Tests: No results for input(s): AST, ALT, ALKPHOS, BILITOT, PROT, ALBUMIN in the last 168 hours. No results for input(s): LIPASE, AMYLASE in the last 168 hours. No results for input(s): AMMONIA in the last 168 hours. Coagulation Profile: No results for input(s): INR, PROTIME in the last 168 hours. Cardiac Enzymes: No results for input(s): CKTOTAL, CKMB, CKMBINDEX, TROPONINI in the last 168 hours. BNP (last 3 results) No results for input(s): PROBNP in the last 8760 hours. HbA1C: No results for input(s): HGBA1C in the last 72 hours. CBG: Recent Labs  Lab 02/27/20 1925 02/27/20 2335 02/28/20 0323 02/28/20 0732 02/28/20 1203  GLUCAP 113* 182* 125* 79 94   Lipid Profile: No results for input(s): CHOL, HDL,  LDLCALC, TRIG, CHOLHDL, LDLDIRECT in the last 72 hours. Thyroid Function Tests: No results for input(s): TSH, T4TOTAL, FREET4, T3FREE, THYROIDAB in the last 72 hours. Anemia Panel: No results for input(s): VITAMINB12, FOLATE, FERRITIN, TIBC, IRON, RETICCTPCT in the last 72 hours. Sepsis Labs: No results for input(s): PROCALCITON, LATICACIDVEN in the last 168 hours.  Recent Results (from the past 240 hour(s))  Culture, respiratory (non-expectorated)     Status: None   Collection Time: 02/20/20 12:16 PM   Specimen: Tracheal Aspirate; Respiratory  Result Value Ref Range Status   Specimen Description TRACHEAL ASPIRATE  Final   Special Requests Normal  Final   Gram Stain   Final    NO WBC SEEN NO ORGANISMS SEEN Performed at [facility], [address] [place], [place] [address]    Culture RARE CANDIDA ALBICANS RARE SERRATIA MARCESCENS   Final   Report Status 02/22/2020 FINAL  Final   Organism ID, Bacteria SERRATIA MARCESCENS  Final      Susceptibility   Serratia marcescens - MIC*    CEFAZOLIN >=64 RESISTANT Resistant     CEFEPIME <=0.12 SENSITIVE Sensitive     CEFTAZIDIME <=1 SENSITIVE Sensitive     CEFTRIAXONE <=0.25 SENSITIVE Sensitive     CIPROFLOXACIN <=0.25 SENSITIVE Sensitive     GENTAMICIN <=1 SENSITIVE Sensitive     TRIMETH/SULFA <=20 SENSITIVE Sensitive     * RARE SERRATIA MARCESCENS         Radiology Studies: DG CHEST PORT 1 VIEW  Result Date: 02/27/2020 CLINICAL DATA:  Shortness of breath EXAM: PORTABLE CHEST 1 VIEW COMPARISON:  February 26, 2020. FINDINGS: There is persistent airspace opacity in the right upper lobe with slightly more consolidation near the right minor fissure compared to 1 day prior. There is atelectasis in the left lower lobe with small left pleural effusion. Small right pleural effusion also noted. Heart is upper normal in size with pulmonary vascularity normal. Feeding tube tip is at the level of the pylorus. Thoracic stimulator leads are  in the midthoracic region, stable. There is aortic atherosclerosis. No bone lesions. IMPRESSION: Persistent airspace opacity right upper lobe with slightly greater consolidation near the minor fissure on the right compared to 1 day prior. Persistent small left pleural effusion with left lower lobe atelectasis. Stable small right pleural effusion. Stable cardiac silhouette.  Aortic Atherosclerosis (ICD10-I70.0). Feeding tube tip at level of pylorus. Electronically Signed   By: Lowella Grip III M.D.   On: 02/27/2020 12:45        Scheduled Meds: .  apixaban  5 mg Oral BID  . bethanechol  5 mg Oral TID  . Chlorhexidine Gluconate Cloth  6 each Topical Daily  . feeding supplement (ENSURE ENLIVE)  237 mL Oral TID BM  . feeding supplement (OSMOLITE 1.2 CAL)  840 mL Per Tube Q24H  . folic acid  1 mg Per Tube Daily  . furosemide  20 mg Intravenous Daily  . Gerhardt's butt cream   Topical TID  . guaiFENesin  10 mL Per Tube Q6H  . hydrALAZINE  25 mg Oral Q8H  . insulin aspart  0-9 Units Subcutaneous Q4H  . ipratropium  0.5 mg Nebulization BID  . levalbuterol  0.63 mg Nebulization BID  . mouth rinse  15 mL Mouth Rinse BID  . multivitamin with minerals  1 tablet Per Tube Daily  . nicotine  21 mg Transdermal Daily  . pantoprazole sodium  40 mg Per Tube BID  . polyethylene glycol  17 g Oral Daily  . potassium chloride  60 mEq Oral Once  . thiamine  100 mg Per Tube Daily   Continuous Infusions: . sodium chloride Stopped (02/16/20 1146)     LOS: 16 days        Hosie Poisson, MD [facility]   To contact the attending provider between 7A-7P or the covering provider during after hours 7P-7A, please log into the web site [email or web address] and access using universal [place] password for that web site. If you do not have the password, please call the hospital operator.  02/28/2020, 3:02 PM

## 2020-02-28 NOTE — Progress Notes (Signed)
Inpatient Rehab Admissions Coordinator:   Opened case for prior authorization with [employer].  Awaiting determination.  I have no beds available for this patient to admit to CIR today.  Will continue to follow for timing of potential admission pending bed availability.   Estill Dooms, PT, DPT Admissions Coordinator [phone] 02/28/20  11:42 AM

## 2020-02-29 DIAGNOSIS — J9621 Acute and chronic respiratory failure with hypoxia: Secondary | ICD-10-CM | POA: Diagnosis not present

## 2020-02-29 DIAGNOSIS — J690 Pneumonitis due to inhalation of food and vomit: Secondary | ICD-10-CM | POA: Diagnosis not present

## 2020-02-29 DIAGNOSIS — R410 Disorientation, unspecified: Secondary | ICD-10-CM | POA: Diagnosis not present

## 2020-02-29 DIAGNOSIS — R778 Other specified abnormalities of plasma proteins: Secondary | ICD-10-CM | POA: Diagnosis not present

## 2020-02-29 DIAGNOSIS — R0602 Shortness of breath: Secondary | ICD-10-CM

## 2020-02-29 LAB — BASIC METABOLIC PANEL
Anion gap: 9 (ref 5–15)
BUN: 12 mg/dL (ref 8–23)
CO2: 31 mmol/L (ref 22–32)
Calcium: 8.4 mg/dL — ABNORMAL LOW (ref 8.9–10.3)
Chloride: 98 mmol/L (ref 98–111)
Creatinine, Ser: 0.35 mg/dL — ABNORMAL LOW (ref 0.44–1.00)
GFR calc Af Amer: >60 mL/min (ref >60)
GFR calc non Af Amer: >60 mL/min (ref >60)
Glucose, Bld: 92 mg/dL (ref 70–99)
Potassium: 4.3 mmol/L (ref 3.5–5.1)
Sodium: 138 mmol/L (ref 135–145)

## 2020-02-29 LAB — CBC
HCT: 25 % — ABNORMAL LOW (ref 36.0–46.0)
Hemoglobin: 8.1 g/dL — ABNORMAL LOW (ref 12.0–15.0)
MCH: 38 pg — ABNORMAL HIGH (ref 26.0–34.0)
MCHC: 32.4 g/dL (ref 30.0–36.0)
MCV: 117.4 fL — ABNORMAL HIGH (ref 80.0–100.0)
Platelets: 286 10*3/uL (ref 150–400)
RBC: 2.13 MIL/uL — ABNORMAL LOW (ref 3.87–5.11)
RDW: 13.7 % (ref 11.5–15.5)
WBC: 8.2 10*3/uL (ref 4.0–10.5)
nRBC: 0 % (ref 0.0–0.2)

## 2020-02-29 LAB — MAGNESIUM: Magnesium: 1.9 mg/dL (ref 1.7–2.4)

## 2020-02-29 LAB — GLUCOSE, CAPILLARY
Glucose-Capillary: 89 mg/dL (ref 70–99)
Glucose-Capillary: 97 mg/dL (ref 70–99)

## 2020-02-29 NOTE — Progress Notes (Signed)
Inpatient Rehab Admissions Coordinator:   I have insurance authorization for pt to admit to CIR; however, I do not have a bed available for this patient today.  Will continue to follow for timing of rehab admission pending bed availability.   Estill Dooms, PT, DPT Admissions Coordinator [phone] 02/29/20  11:56 AM

## 2020-02-29 NOTE — Progress Notes (Addendum)
  Speech Language Pathology Treatment: Dysphagia  Patient Details Name: Dawn Beasley MRN: [id number] DOB: May 05, 1951 Today's Date: 02/29/2020 Time: 8502-7741 SLP Time Calculation (min) (ACUTE ONLY): 12 min  Assessment / Plan / Recommendation Clinical Impression  Pt has progressed well in regards to her respiratory based dysphagia. Her work of breathing at rest has decreased and back pain was her primary complaint after receiving pain meds 30 min ago but participatory in repositioning. Mastication of higher texture and transit was functional without increased dyspnea and pt very pleased at therapists recommendation to upgrade from Dys 3 to regular. No s/s aspiration with thin and appeared to coordinate respiration with swallow effectively. Texture upgraded in computer and assisted in ordering lunch meal. To rehab is the plan when bed opens and insurance approves. Pt does not need continued therapy while on acute care.    HPI HPI: 69 yo female adm to [facility] with respiratory issues requiring intubation - Recent endoscopy 02/06/2020 showing esophagitis.  She reports post-endoscopy severe vomiting = dark colored emesis.  [name] due to frequent issues with vomiting and weight per her report.  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  Imaging studies show severe right multifocal infiltrates c/w pna.   Pt denies issues with swallowing but admits to vomiting and weight loss. Pt developed altered mental status, intermittent agitation/lethargy, evolving respiratory failure requiring BiPAP and transferred to ICU. Now off Bipap and on HFNC 4.      SLP Plan  All goals met;Discharge SLP treatment due to (comment)       Recommendations  Diet recommendations: Regular;Thin liquid Liquids provided via: Cup;Straw Medication Administration: Whole meds with puree Supervision: Patient able to self feed Compensations: Slow rate;Small sips/bites;Other (Comment) Postural  Changes and/or Swallow Maneuvers: Seated upright 90 degrees                Oral Care Recommendations: Oral care BID Follow up Recommendations: Inpatient Rehab SLP Visit Diagnosis: Dysphagia, unspecified (R13.10) Plan: All goals met;Discharge SLP treatment due to (comment)       GO                Houston Siren 02/29/2020, 10:30 AM   Orbie Pyo Colvin Caroli.Ed Risk analyst [phone] Office [phone]

## 2020-02-29 NOTE — Progress Notes (Signed)
Physical Therapy Treatment Patient Details Name: Dawn Beasley MRN: [id number] DOB: [birth year] Today's Date: 02/29/2020    History of Present Illness Pt adm with acute hypoxemic respiratory failure due to aspiration PNA. Intubated 3/27-3/29. Transferred out of ICU 3/30 and back into ICU on 4/1 with respiratory distress and placed on bipap.    PMH - HTN, etoh abuse, opioid abuse, spinal stenosis, copd, AAA    PT Comments    Pt looking alert and ready this morning.  Back pain started as she started to move.  She moved to EOB without use of rail, no assist, but with struggle.  Emphasis on gait stability and stamina.    Follow Up Recommendations  CIR;Supervision/Assistance - 24 hour     Equipment Recommendations  Other (comment)    Recommendations for Other Services       Precautions / Restrictions Precautions Precautions: Fall    Mobility  Bed Mobility Overal bed mobility: Needs Assistance Bed Mobility: Supine to Sit   Sidelying to sit: Min guard Supine to sit: HOB elevated     General bed mobility comments: up via R elbow, assisted by raised HOB, slow and mildly effortful.  Transfers Overall transfer level: Needs assistance   Transfers: Sit to/from Stand Sit to Stand: Min assist         General transfer comment: consistently needing cues for transfer safety/hand placement  Ambulation/Gait Ambulation/Gait assistance: Min assist Gait Distance (Feet): 55 Feet(x2) Assistive device: Rolling walker (2 wheeled) Gait Pattern/deviations: Step-through pattern Gait velocity: decr Gait velocity interpretation: <1.8 ft/sec, indicate of risk for recurrent falls General Gait Details: continues with mild unsteadiness, drifting and waggle.  Noticeably fatiguing  HR rising in to the low 130's  sats 94%.  All on 3L [place]   Stairs             Wheelchair Mobility    Modified Rankin (Stroke Patients Only)       Balance Overall balance assessment: Needs assistance    Sitting balance-Leahy Scale: Fair       Standing balance-Leahy Scale: Poor Standing balance comment: reliant on AD and external support                            Cognition Arousal/Alertness: Awake/alert Behavior During Therapy: WFL for tasks assessed/performed Overall Cognitive Status: (NT formally)                                        Exercises      General Comments        Pertinent Vitals/Pain Pain Assessment: Faces Faces Pain Scale: Hurts even more Pain Location: back Pain Descriptors / Indicators: Discomfort;Grimacing;Guarding Pain Intervention(s): Monitored during session;Repositioned    Home Living                      Prior Function            PT Goals (current goals can now be found in the care plan section) Acute Rehab PT Goals Patient Stated Goal: return home PT Goal Formulation: With patient Time For Goal Achievement: 03/03/20 Potential to Achieve Goals: Fair    Frequency    Min 3X/week      PT Plan Current plan remains appropriate    Co-evaluation              AM-PAC PT "6  Clicks" Mobility   Outcome Measure  Help needed turning from your back to your side while in a flat bed without using bedrails?: A Little Help needed moving from lying on your back to sitting on the side of a flat bed without using bedrails?: A Little Help needed moving to and from a bed to a chair (including a wheelchair)?: A Little Help needed standing up from a chair using your arms (e.g., wheelchair or bedside chair)?: A Little Help needed to walk in hospital room?: A Little Help needed climbing 3-5 steps with a railing? : Total 6 Click Score: 16    End of Session Equipment Utilized During Treatment: Oxygen Activity Tolerance: Patient tolerated treatment well;Patient limited by fatigue Patient left: in chair;with call bell/phone within reach;with family/visitor present Nurse Communication: Mobility status PT Visit  Diagnosis: Unsteadiness on feet (R26.81);Other abnormalities of gait and mobility (R26.89);Muscle weakness (generalized) (M62.81)     Time: 2423-5361 PT Time Calculation (min) (ACUTE ONLY): 24 min  Charges:  $Gait Training: 8-22 mins $Therapeutic Activity: 8-22 mins                     02/29/2020  Jacinto Halim., PT Acute Rehabilitation Services [phone]  (pager) [phone]  (office)   Eliseo Gum Mylasia Vorhees 02/29/2020, 12:34 PM

## 2020-02-29 NOTE — Progress Notes (Signed)
Occupational Therapy Treatment Patient Details Name: Dawn Beasley MRN: [id number] DOB: Mar 26, 1951 Today's Date: 02/29/2020    History of present illness Pt adm with acute hypoxemic respiratory failure due to aspiration PNA. Intubated 3/27-3/29. Transferred out of ICU 3/30 and back into ICU on 4/1 with respiratory distress and placed on bipap.    PMH - HTN, etoh abuse, opioid abuse, spinal stenosis, copd, AAA   OT comments  Pt making progress in therapy, demonstrating improved activity tolerance and independence with self-care and functional transfer tasks. Educated/instructed pt on importance of completing tasks as independently as possible with good understanding. Pt declined mobility tasks this date due to fatigue. Pt engaged in grooming, hygiene, dressing, and bathing task while seated in bedside chair. Pt tolerated standing 1 x 1 min with RW and min guard while completing peri care. Pt reported mod fatigue and SOB following with SpO2 maintaining in 90s. Educated/instructed pt pursed lip breathing strategies with good understanding and follow through. Pt required increased time to complete tasks. OT will continue to follow acutely.    Follow Up Recommendations  CIR;Supervision/Assistance - 24 hour    Equipment Recommendations  Other (comment)(TBD at next venue of care)    Recommendations for Other Services      Precautions / Restrictions Precautions Precautions: Fall Restrictions Weight Bearing Restrictions: No       Mobility Bed Mobility Overal bed mobility: Needs Assistance Bed Mobility: Supine to Sit   Sidelying to sit: Min guard Supine to sit: HOB elevated     General bed mobility comments: Pt seated in bedside chair upon OT arrival  Transfers Overall transfer level: Needs assistance Equipment used: Rolling walker (2 wheeled) Transfers: Sit to/from Stand Sit to Stand: Min guard         General transfer comment: To ensure balance and safety    Balance Overall  balance assessment: Needs assistance   Sitting balance-Leahy Scale: Good       Standing balance-Leahy Scale: Fair Standing balance comment: reliant on AD and external support                           ADL either performed or assessed with clinical judgement   ADL Overall ADL's : Needs assistance/impaired     Grooming: Wash/dry hands;Wash/dry face;Oral care;Brushing hair;Set up;Supervision/safety;Sitting   Upper Body Bathing: Set up;Supervision/ safety;Sitting   Lower Body Bathing: Supervison/ safety;Min guard;Sit to/from stand Lower Body Bathing Details (indicate cue type and reason): Supervision while seated, min guard while standing to complete     Lower Body Dressing: Set up;Supervision/safety;Sitting/lateral leans Lower Body Dressing Details (indicate cue type and reason): While seated in bedside chair. Figure four position.              Functional mobility during ADLs: Min guard;Rolling walker General ADL Comments: Pt tolerated standing 1 x 1 min with min guard to complete peri care. Pt engaged in seated self-care tasks.      Vision       Perception     Praxis      Cognition Arousal/Alertness: Awake/alert Behavior During Therapy: WFL for tasks assessed/performed Overall Cognitive Status: Within Functional Limits for tasks assessed                                 General Comments: A&O x 4. Pt able to follow one step commands without difficulty.  Exercises     Shoulder Instructions       General Comments VSS on 2L [place]    Pertinent Vitals/ Pain       Pain Assessment: 0-10 Pain Score: 5  Faces Pain Scale: Hurts even more Pain Location: back Pain Descriptors / Indicators: Discomfort;Grimacing;Guarding Pain Intervention(s): Limited activity within patient's tolerance;Monitored during session;Repositioned  Home Living                                          Prior Functioning/Environment               Frequency           Progress Toward Goals  OT Goals(current goals can now be found in the care plan section)  Progress towards OT goals: Progressing toward goals  Acute Rehab OT Goals Patient Stated Goal: return home ADL Goals Pt Will Perform Lower Body Bathing: with mod assist;sit to/from stand;sitting/lateral leans Pt Will Perform Lower Body Dressing: with mod assist;sitting/lateral leans;sit to/from stand Pt Will Transfer to Toilet: with min assist;stand pivot transfer;bedside commode Additional ADL Goal #1: Pt will sit EOB with min A to perform a grooming task for 5 minutes Additional ADL Goal #2: Pt will demonstrate emergent level of awareness for increased safety with BADL  Plan Discharge plan remains appropriate    Co-evaluation                 AM-PAC OT "6 Clicks" Daily Activity     Outcome Measure   Help from another person eating meals?: A Little Help from another person taking care of personal grooming?: A Little Help from another person toileting, which includes using toliet, bedpan, or urinal?: A Lot Help from another person bathing (including washing, rinsing, drying)?: A Little Help from another person to put on and taking off regular upper body clothing?: A Little Help from another person to put on and taking off regular lower body clothing?: A Little 6 Click Score: 17    End of Session Equipment Utilized During Treatment: Rolling walker  OT Visit Diagnosis: Unsteadiness on feet (R26.81);Other abnormalities of gait and mobility (R26.89);Muscle weakness (generalized) (M62.81);Other symptoms and signs involving cognitive function   Activity Tolerance Patient limited by fatigue   Patient Left in chair;with call bell/phone within reach;with family/visitor present   Nurse Communication Mobility status        Time: 4401-0272 OT Time Calculation (min): 26 min  Charges: OT General Charges $OT Visit: 1 Visit OT Treatments $Self Care/Home  Management : 23-37 mins  Peterson Ao OTR/L [phone]   Peterson Ao 02/29/2020, 1:12 PM

## 2020-02-29 NOTE — Progress Notes (Signed)
PROGRESS NOTE    Dawn Beasley  OVZ:[id number] DOB: 05-13-1951 DOA: 02/11/2020 PCP: Cyndi Bender, PA-C     Brief Narrative:  69 year old WF PMHx COPD, HTN, chronic back pain s/p elective stimulator implanted~2016, chronic Gastric Dysmotility, Chronic Emesis developed a RIGHT sided aspiration pneumonia intubated on 02/11/2020 and extubated on 02/13/2020.  She was initially transferred out of the ICU on 02/14/2020 but was back in the ICU on 02/16/2020 for worsening agitation.  CT of the head on 3/431 does not show any acute intracranial abnormality.  She was also found to have RIGHT lower lobe segmental PE and waas started on Eliquis.   Subsequent echocardiogram does not show any right heart strain.  She had prolonged ICU stay due to acute metabolic encephalopathy.  Currently she is alert and oriented and has been transferred out of ICU to [facility] service on 02/24/2020. Hospital course complicated by mild acute on chronic diastolic heart failure and she was started on IV Lasix with good diuresis.  Therapy evaluations recommending CIR.  CIR admission pending insurance authorization and bed availability Urinary retention requiring 2 times of in and out cath, foley catheter was placed for aggressive diuresis and urinary retention    Subjective: A/O X4, negative CP, negative abdominal pain, negative N/V.  States negative postprandial pain/nausea.  Negative S OB.  States ambulated around the ward.  Only complaint is chronic back pain.  S/p electric stimulator implanted~2016 which she does not believe provides any relief.  States not on home O2..   Assessment & Plan:   Active Problems:   Essential hypertension   Pancytopenia (HCC)   Sepsis ([place])   Aspiration pneumonia ([place])   Acute on chronic respiratory failure with hypoxia (HCC)   Hyponatremia   AKI (acute kidney injury) ([place])   Dehydration   Rhabdomyolysis   Elevated troponin   Respiratory failure (HCC)   Multiple subsegmental pulmonary emboli  without acute cor pulmonale (HCC)   Delirium  Acute respiratory failure with hypoxia -Multifactorial probably from aspiration pneumonia, right lower lobe segmental pulmonary embolism, COPD exacerbation, pulmonary edema, benzo withdrawal and Mild acute on chronic diastolic heart failure She was initially intubated on 3/27 and successfully extubated on 02/13/2020.  Patient is currently weaned down to 2 to 3 L of nasal cannula oxygen to keep sats greater than 90%  Patient has completed a total of 10 days of IV antibiotics at this time. Echocardiogram shows normal RV systolic function and hyperdynamic left ventricular ejection fraction of 65 to 70% the patient continues to be tachypneic.  Diuresing well with IV Lasix,  has diuresed about 12 L since admission Repeat chest x-ray showed Persistent airspace opacity right upper lobe with slightly greater consolidation near the minor fissure on the right compared to 1 day prior. Persistent small left pleural effusion with left lower lobe atelectasis. Stable small right pleural effusion. Recommend to change to oral lasix tomorrow.  -Ambulatory SPO2 pending   Acute metabolic encephalopathy Resolved ,probably secondary to a combination of benzodiazepine withdrawal, metabolic derangements, sundowning, hospital delirium. Continue with Xanax and oxycodone at this time.  Alert and oriented and able to answer all questions appropriately.     Acute kidney injury s/p bladder outlet obstruction S/p In-N-Out cath twice on the floor, Foley catheter placed for Urinary Retention.  Continue with Urecholine 5 mg 3 times daily. Voiding trial possibly in the next 1 to 2 days after transitioning to oral lasix.     Right lower lobe segmental pulmonary embolism suspected incidental finding.  Lower extremity Dopplers are negative.   Continue with Eliquis at this time and wean her off the oxygen as appropriate. Tachypnea has improved.    Mild acute on  chronic diastolic heart failure: Chest x-ray showed vascular congestion, she was started on IV lasix, .  Diuresed appropriately with IV lasix, so far neg neg 12 lit since admission.  Continue with strict intake and output and daily weights. Monitor renal parameters while on Lasix. Replace potassium and magnesium as needed.   Essential hypertension Well-controlled blood pressure parameters  Hypokalemia Replaced, repeat in the morning.   Hypomagnesemia:  Replaced, repeat in am.   Anemia of chronic disease/iron deficiency anemia Hemoglobin around 8. Transfuse to keep hemoglobin greater than 7    History of chronic dysmotility, gastropathy and chronic emesis and GERD S/p endoscopy and colonoscopy on 02/06/2020 showing colitis and reactive gastropathy. Continue with Protonix 40 mg twice daily Continue with the bowel regimen and follow-up with gastroenterology in about 6 weeks.   Protein calorie malnutrition Speech evaluation recommending dysphagia 3 diet but she was also started on tube feeds at nights via core track to supplement nutrition. Pt wants the cor trak out as it is giving her loose bowel movements. Decrease the rate of the nocturnal feeds to 30 ml/hr.      DVT prophylaxis: Apixaban Code Status: Full Family Communication: 4/14 family members at bedside discussed plan of care, answered questions Disposition Plan:  1.  Where the patient is from 2.  Anticipated d/c place. 3.  Barriers to d/c awaiting CIR placement   Consultants:    Procedures/Significant Events:    I have personally reviewed and interpreted all radiology studies and my findings are as above.  VENTILATOR SETTINGS:    Cultures   Antimicrobials: Anti-infectives (From admission, onward)   Start     Stop   02/21/20 1130  vancomycin (VANCOCIN) 50 mg/mL oral solution 125 mg  Status:  Discontinued     02/22/20 0810   02/20/20 1100  ceFEPIme (MAXIPIME) 2 g in sodium chloride 0.9 % 100  mL IVPB  Status:  Discontinued     02/23/20 0823   02/15/20 1230  metroNIDAZOLE (FLAGYL) IVPB 500 mg  Status:  Discontinued     02/17/20 1123   02/15/20 1230  ceFEPIme (MAXIPIME) 2 g in sodium chloride 0.9 % 100 mL IVPB  Status:  Discontinued     02/18/20 1029   02/12/20 2030  Ampicillin-Sulbactam (UNASYN) 3 g in sodium chloride 0.9 % 100 mL IVPB  Status:  Discontinued     02/15/20 1153   02/12/20 2000  cefTRIAXone (ROCEPHIN) 1 g in sodium chloride 0.9 % 100 mL IVPB  Status:  Discontinued     02/12/20 0451   02/12/20 1000  ceFEPIme (MAXIPIME) 2 g in sodium chloride 0.9 % 100 mL IVPB  Status:  Discontinued     02/12/20 1344   02/12/20 0700  metroNIDAZOLE (FLAGYL) IVPB 500 mg  Status:  Discontinued     02/12/20 0441   02/12/20 0545  vancomycin (VANCOREADY) IVPB 750 mg/150 mL  Status:  Discontinued     02/12/20 1344   02/11/20 2230  cefTRIAXone (ROCEPHIN) 2 g in sodium chloride 0.9 % 100 mL IVPB  Status:  Discontinued     02/12/20 0441   02/11/20 2230  azithromycin (ZITHROMAX) 500 mg in sodium chloride 0.9 % 250 mL IVPB  Status:  Discontinued     02/12/20 0441   02/11/20 2230  metroNIDAZOLE (FLAGYL) IVPB 500 mg  02/12/20 0204       Devices    LINES / TUBES:      Continuous Infusions: . sodium chloride Stopped (02/16/20 1146)     Objective: Vitals:   02/29/20 0346 02/29/20 0538 02/29/20 0722 02/29/20 0826  BP:  132/75 137/62   Pulse:  (!) 105 100   Resp:   18   Temp:   99.1 F (37.3 C)   TempSrc:   Oral   SpO2:  99% 97% 98%  Weight: 53.7 kg     Height:        Intake/Output Summary (Last 24 hours) at 02/29/2020 8366 Last data filed at 02/28/2020 2110 Gross per 24 hour  Intake 357 ml  Output 1500 ml  Net -1143 ml   Filed Weights   02/27/20 0459 02/28/20 0335 02/29/20 0346  Weight: 55.8 kg 54.4 kg 53.7 kg    Examination:  General: A/O x4, no acute respiratory distress, cachectic Eyes: negative scleral hemorrhage, negative anisocoria, negative  icterus ENT: Negative Runny nose, negative gingival bleeding, Neck:  Negative scars, masses, torticollis, lymphadenopathy, JVD Lungs: Tachypneic, clear to auscultation bilaterally without wheezes or crackles Cardiovascular: Regular rate and rhythm without murmur gallop or rub normal S1 and S2 Abdomen: negative abdominal pain, nondistended, positive soft, bowel sounds, no rebound, no ascites, no appreciable mass Extremities: No significant cyanosis, clubbing, or edema bilateral lower extremities Skin: Negative rashes, lesions, ulcers Psychiatric:  Negative depression, negative anxiety, negative fatigue, negative mania  Central nervous system:  Cranial nerves II through XII intact, tongue/uvula midline, all extremities muscle strength 5/5, sensation intact throughout,  negative dysarthria, negative expressive aphasia, negative receptive aphasia. .     Data Reviewed: Care during the described time interval was provided by me .  I have reviewed this patient's available data, including medical history, events of note, physical examination, and all test results as part of my evaluation.  CBC: Recent Labs  Lab 02/23/20 0933 02/24/20 0937 02/25/20 1237 02/26/20 1018 02/29/20 0305  WBC 19.7* 11.9* 10.2 7.8 8.2  NEUTROABS 18.4* 10.8* 9.1* 6.7  --   HGB 8.3* 8.2* 7.9* 8.1* 8.1*  HCT 25.8* 24.8* 24.4* 24.9* 25.0*  MCV 122.9* 119.8* 119.0* 119.7* 117.4*  PLT 260 245 227 230 286   Basic Metabolic Panel: Recent Labs  Lab 02/25/20 1237 02/26/20 1018 02/27/20 1003 02/27/20 1730 02/28/20 0919 02/29/20 0305  NA 140 139 140  --  139 138  K 3.6 3.5 3.1*  --  3.5 4.3  CL 99 96* 92*  --  96* 98  CO2 33* 32 40*  --  35* 31  GLUCOSE 108* 127* 139*  --  149* 92  BUN 10 8 8   --  12 12  CREATININE 0.41* 0.35* 0.44  --  0.46 0.35*  CALCIUM 8.2* 8.3* 8.1*  --  8.6* 8.4*  MG  --   --   --  1.5*  --  1.9   GFR: Estimated Creatinine Clearance: 54.7 mL/min (A) (by C-G formula based on SCr of 0.35  mg/dL (L)). Liver Function Tests: No results for input(s): AST, ALT, ALKPHOS, BILITOT, PROT, ALBUMIN in the last 168 hours. No results for input(s): LIPASE, AMYLASE in the last 168 hours. No results for input(s): AMMONIA in the last 168 hours. Coagulation Profile: No results for input(s): INR, PROTIME in the last 168 hours. Cardiac Enzymes: No results for input(s): CKTOTAL, CKMB, CKMBINDEX, TROPONINI in the last 168 hours. BNP (last 3 results) No results for input(s): PROBNP in the  last 8760 hours. HbA1C: No results for input(s): HGBA1C in the last 72 hours. CBG: Recent Labs  Lab 02/28/20 1516 02/28/20 2048 02/28/20 2312 02/29/20 0322 02/29/20 0720  GLUCAP 126* 101* 138* 89 97   Lipid Profile: No results for input(s): CHOL, HDL, LDLCALC, TRIG, CHOLHDL, LDLDIRECT in the last 72 hours. Thyroid Function Tests: No results for input(s): TSH, T4TOTAL, FREET4, T3FREE, THYROIDAB in the last 72 hours. Anemia Panel: No results for input(s): VITAMINB12, FOLATE, FERRITIN, TIBC, IRON, RETICCTPCT in the last 72 hours. Sepsis Labs: No results for input(s): PROCALCITON, LATICACIDVEN in the last 168 hours.  Recent Results (from the past 240 hour(s))  Culture, respiratory (non-expectorated)     Status: None   Collection Time: 02/20/20 12:16 PM   Specimen: Tracheal Aspirate; Respiratory  Result Value Ref Range Status   Specimen Description TRACHEAL ASPIRATE  Final   Special Requests Normal  Final   Gram Stain   Final    NO WBC SEEN NO ORGANISMS SEEN Performed at [facility], [address] [place], [place] [address]    Culture RARE CANDIDA ALBICANS RARE SERRATIA MARCESCENS   Final   Report Status 02/22/2020 FINAL  Final   Organism ID, Bacteria SERRATIA MARCESCENS  Final      Susceptibility   Serratia marcescens - MIC*    CEFAZOLIN >=64 RESISTANT Resistant     CEFEPIME <=0.12 SENSITIVE Sensitive     CEFTAZIDIME <=1 SENSITIVE Sensitive     CEFTRIAXONE <=0.25 SENSITIVE  Sensitive     CIPROFLOXACIN <=0.25 SENSITIVE Sensitive     GENTAMICIN <=1 SENSITIVE Sensitive     TRIMETH/SULFA <=20 SENSITIVE Sensitive     * RARE SERRATIA MARCESCENS         Radiology Studies: DG CHEST PORT 1 VIEW  Result Date: 02/27/2020 CLINICAL DATA:  Shortness of breath EXAM: PORTABLE CHEST 1 VIEW COMPARISON:  February 26, 2020. FINDINGS: There is persistent airspace opacity in the right upper lobe with slightly more consolidation near the right minor fissure compared to 1 day prior. There is atelectasis in the left lower lobe with small left pleural effusion. Small right pleural effusion also noted. Heart is upper normal in size with pulmonary vascularity normal. Feeding tube tip is at the level of the pylorus. Thoracic stimulator leads are in the midthoracic region, stable. There is aortic atherosclerosis. No bone lesions. IMPRESSION: Persistent airspace opacity right upper lobe with slightly greater consolidation near the minor fissure on the right compared to 1 day prior. Persistent small left pleural effusion with left lower lobe atelectasis. Stable small right pleural effusion. Stable cardiac silhouette.  Aortic Atherosclerosis (ICD10-I70.0). Feeding tube tip at level of pylorus. Electronically Signed   By: Bretta Bang III M.D.   On: 02/27/2020 12:45        Scheduled Meds: . apixaban  5 mg Oral BID  . bethanechol  5 mg Oral TID  . Chlorhexidine Gluconate Cloth  6 each Topical Daily  . feeding supplement (ENSURE ENLIVE)  237 mL Oral TID BM  . feeding supplement (OSMOLITE 1.2 CAL)  840 mL Per Tube Q24H  . folic acid  1 mg Per Tube Daily  . furosemide  20 mg Intravenous Daily  . Gerhardt's butt cream   Topical TID  . guaiFENesin  10 mL Per Tube Q6H  . hydrALAZINE  25 mg Oral Q8H  . insulin aspart  0-9 Units Subcutaneous Q4H  . ipratropium  0.5 mg Nebulization BID  . levalbuterol  0.63 mg Nebulization BID  .  mouth rinse  15 mL Mouth Rinse BID  . multivitamin with  minerals  1 tablet Per Tube Daily  . nicotine  21 mg Transdermal Daily  . pantoprazole sodium  40 mg Per Tube BID  . polyethylene glycol  17 g Oral Daily  . thiamine  100 mg Per Tube Daily   Continuous Infusions: . sodium chloride Stopped (02/16/20 1146)     LOS: 17 days    Time spent:40 min    Lulla Linville, Roselind Messier, MD [facility] Pager [phone]  If 7PM-7AM, please contact night-coverage [email or web address] Password [facility] 02/29/2020, 9:21 AM

## 2020-03-01 DIAGNOSIS — J9621 Acute and chronic respiratory failure with hypoxia: Secondary | ICD-10-CM | POA: Diagnosis not present

## 2020-03-01 DIAGNOSIS — R410 Disorientation, unspecified: Secondary | ICD-10-CM | POA: Diagnosis not present

## 2020-03-01 DIAGNOSIS — L89152 Pressure ulcer of sacral region, stage 2: Secondary | ICD-10-CM

## 2020-03-01 DIAGNOSIS — R778 Other specified abnormalities of plasma proteins: Secondary | ICD-10-CM | POA: Diagnosis not present

## 2020-03-01 DIAGNOSIS — J189 Pneumonia, unspecified organism: Secondary | ICD-10-CM

## 2020-03-01 DIAGNOSIS — J690 Pneumonitis due to inhalation of food and vomit: Secondary | ICD-10-CM | POA: Diagnosis not present

## 2020-03-01 LAB — COMPREHENSIVE METABOLIC PANEL
ALT: 31 U/L (ref 0–44)
AST: 32 U/L (ref 15–41)
Albumin: 2.5 g/dL — ABNORMAL LOW (ref 3.5–5.0)
Alkaline Phosphatase: 65 U/L (ref 38–126)
Anion gap: 9 (ref 5–15)
BUN: 14 mg/dL (ref 8–23)
CO2: 29 mmol/L (ref 22–32)
Calcium: 8.7 mg/dL — ABNORMAL LOW (ref 8.9–10.3)
Chloride: 99 mmol/L (ref 98–111)
Creatinine, Ser: 0.46 mg/dL (ref 0.44–1.00)
GFR calc Af Amer: >60 mL/min (ref >60)
GFR calc non Af Amer: >60 mL/min (ref >60)
Glucose, Bld: 106 mg/dL — ABNORMAL HIGH (ref 70–99)
Potassium: 5.2 mmol/L — ABNORMAL HIGH (ref 3.5–5.1)
Sodium: 137 mmol/L (ref 135–145)
Total Bilirubin: 0.8 mg/dL (ref 0.3–1.2)
Total Protein: 5.9 g/dL — ABNORMAL LOW (ref 6.5–8.1)

## 2020-03-01 LAB — CBC WITH DIFFERENTIAL/PLATELET
Abs Immature Granulocytes: 0.03 10*3/uL (ref 0.00–0.07)
Basophils Absolute: 0 10*3/uL (ref 0.0–0.1)
Basophils Relative: 0 %
Eosinophils Absolute: 0.2 10*3/uL (ref 0.0–0.5)
Eosinophils Relative: 2 %
HCT: 24.9 % — ABNORMAL LOW (ref 36.0–46.0)
Hemoglobin: 8.2 g/dL — ABNORMAL LOW (ref 12.0–15.0)
Immature Granulocytes: 0 %
Lymphocytes Relative: 17 %
Lymphs Abs: 1.3 10*3/uL (ref 0.7–4.0)
MCH: 38.7 pg — ABNORMAL HIGH (ref 26.0–34.0)
MCHC: 32.9 g/dL (ref 30.0–36.0)
MCV: 117.5 fL — ABNORMAL HIGH (ref 80.0–100.0)
Monocytes Absolute: 0.6 10*3/uL (ref 0.1–1.0)
Monocytes Relative: 7 %
Neutro Abs: 5.8 10*3/uL (ref 1.7–7.7)
Neutrophils Relative %: 74 %
Platelets: 304 10*3/uL (ref 150–400)
RBC: 2.12 MIL/uL — ABNORMAL LOW (ref 3.87–5.11)
RDW: 13.7 % (ref 11.5–15.5)
WBC: 8 10*3/uL (ref 4.0–10.5)
nRBC: 0 % (ref 0.0–0.2)

## 2020-03-01 LAB — PHOSPHORUS: Phosphorus: 3.8 mg/dL (ref 2.5–4.6)

## 2020-03-01 LAB — MAGNESIUM: Magnesium: 1.7 mg/dL (ref 1.7–2.4)

## 2020-03-01 MED ORDER — MAGNESIUM SULFATE 2 GM/50ML IV SOLN
2.0000 g | Freq: Once | INTRAVENOUS | Status: AC
  Administered 2020-03-01: 2 g via INTRAVENOUS
  Filled 2020-03-01: qty 50

## 2020-03-01 MED ORDER — SODIUM ZIRCONIUM CYCLOSILICATE 10 G PO PACK
10.0000 g | PACK | Freq: Once | ORAL | Status: AC
  Administered 2020-03-01: 10 g via ORAL
  Filled 2020-03-01: qty 1

## 2020-03-01 MED ORDER — PANTOPRAZOLE SODIUM 40 MG PO TBEC
40.0000 mg | DELAYED_RELEASE_TABLET | Freq: Two times a day (BID) | ORAL | Status: DC
  Administered 2020-03-01 – 2020-03-02 (×3): 40 mg via ORAL
  Filled 2020-03-01 (×3): qty 1

## 2020-03-01 NOTE — Progress Notes (Signed)
Inpatient Rehab Admissions Coordinator:   I have no beds available for this patient to admit to CIR today.  Will continue to follow for timing of potential admission pending bed availability.   Estill Dooms, PT, DPT Admissions Coordinator [phone] 03/01/20  1:06 PM

## 2020-03-01 NOTE — Progress Notes (Signed)
PROGRESS NOTE    Dawn JEAN  Beasley:[id number] DOB: 1950/12/24 DOA: 02/11/2020 PCP: Lonie Peak, PA-C     Brief Narrative:  69 year old WF PMHx COPD, HTN, chronic back pain s/p elective stimulator implanted~2016, chronic Gastric Dysmotility, Chronic Emesis developed a RIGHT sided aspiration pneumonia intubated on 02/11/2020 and extubated on 02/13/2020.  She was initially transferred out of the ICU on 02/14/2020 but was back in the ICU on 02/16/2020 for worsening agitation.  CT of the head on 3/431 does not show any acute intracranial abnormality.  She was also found to have RIGHT lower lobe segmental PE and waas started on Eliquis.   Subsequent echocardiogram does not show any right heart strain.  She had prolonged ICU stay due to acute metabolic encephalopathy.  Currently she is alert and oriented and has been transferred out of ICU to [facility] service on 02/24/2020. Hospital course complicated by mild acute on chronic diastolic heart failure and she was started on IV Lasix with good diuresis.  Therapy evaluations recommending CIR.  CIR admission pending insurance authorization and bed availability Urinary retention requiring 2 times of in and out cath, foley catheter was placed for aggressive diuresis and urinary retention    Subjective: 4/15 A/O x4, negative CP, negative abdominal pain, negative N/V.  Positive back pain (acute on chronic).    Assessment & Plan:   Active Problems:   Essential hypertension   Pancytopenia (HCC)   Sepsis (HCC)   Aspiration pneumonia (HCC)   Acute on chronic respiratory failure with hypoxia (HCC)   Hyponatremia   AKI (acute kidney injury) (HCC)   Dehydration   Rhabdomyolysis   Elevated troponin   Respiratory failure (HCC)   Multiple subsegmental pulmonary emboli without acute cor pulmonale (HCC)   Delirium   Sacral decubitus ulcer, stage II (HCC)  Acute respiratory failure with hypoxia/HCAPpositive Serratia Marcescens/Candida Albicans -Multifactorial  probably from aspiration pneumonia, right lower lobe segmental pulmonary embolism, COPD exacerbation, pulmonary edema, benzo withdrawal and Mild acute on chronic diastolic heart failure -3/27 intubated---> 3/29 Extubated   -Completed 10 days IV antibiotics for HCAP  RIGHT lower lobe PE -Apixaban 5 mg BID -Lower extremity Dopplers are negative.   -Patient continues to have episodes of tachypnea and S OB. -Titrate O2 to maintain SPO2> 88%   Acute Metabolic Encephalopathy -Multifactorial benzodiazepine withdrawal, metabolic derangement, sundowning, hospital delirium. -Resolved.  AKI/Urinary Retention -S/p bladder outlet obstruction.   -Foley catheter placed  -Urecholine 5 mg TID.  Acute on Chronic Diastolic CHF -Strict in and out -21.8 L -Daily weight Filed Weights   02/27/20 0459 02/28/20 0335 02/29/20 0346  Weight: 55.8 kg 54.4 kg 53.7 kg  -Lasix IV 20 mg -HydralazinePRN -Hydralazine 25 mg TID  Essential HTN -See CHF  Hyperkalemia -Lokelma 10 g x 1 dose  Hypomagnesmia -Magnesium goal> 2 -Magnesium IV 2 g  Anemia of chronic disease/iron deficiency anemia -Hemoglobin around 8. -Transfuse for hemoglobin <7  Recent Labs  Lab 02/25/20 1237 02/26/20 1018 02/29/20 0305 03/01/20 0259 03/02/20 0313  HGB 7.9* 8.1* 8.1* 8.2* 8.1*    Hx chronic dysmotility/chronic emesis/chronic GERD -3/22 EGD/Colonoscopy; colitis and reactive gastropathy. -Protonix 40 mg daily BID -Continue current bowel regimen -Follow-up GI in 6 weeks   Severe protein calorie malnutrition -Regular diet.  Sacral Decubitus ulcer stage II Pressure Injury 03/01/20 Sacrum Lower Stage 2 -  Partial thickness loss of dermis presenting as a shallow open injury with a red, pink wound bed without slough. quarter sized spot; top layer of skin missing and red (  Active)  03/01/20 1010  Location: Sacrum  Location Orientation: Lower  Staging: Stage 2 -  Partial thickness loss of dermis presenting as a shallow  open injury with a red, pink wound bed without slough.  Wound Description (Comments): quarter sized spot; top layer of skin missing and red  Present on Admission: No  -Patient counseled on getting out of bed to chair in order to change the pressure on her skin in that area. -Ordered gel pad for chair.       DVT prophylaxis: Apixaban Code Status: Full Family Communication: 4/14 family members at bedside discussed plan of care, answered questions Disposition Plan:  1.  Where the patient is from 2.  Anticipated d/c place. 3.  Barriers to d/c awaiting CIR placement   Consultants:    Procedures/Significant Events:  4/12 PCXR;-persistent airspace opacity right upper lobe with slightly greater consolidation near the minor fissure on the right compared to 1 day prior.  -Persistent small left pleural effusion with left lower lobe atelectasis. Stable small right pleural effusion.  I have personally reviewed and interpreted all radiology studies and my findings are as above.  VENTILATOR SETTINGS:    Cultures 3/28 blood LEFT forearm negative final 3/28 blood RIGHT forearm negative final 3/28 MRSA by PCR negative 4/5 tracheal aspirate positive Serratia Marcescens/Candida Albicans    Antimicrobials: Anti-infectives (From admission, onward)   Start     Stop   02/21/20 1130  vancomycin (VANCOCIN) 50 mg/mL oral solution 125 mg  Status:  Discontinued     02/22/20 0810   02/20/20 1100  ceFEPIme (MAXIPIME) 2 g in sodium chloride 0.9 % 100 mL IVPB  Status:  Discontinued     02/23/20 0823   02/15/20 1230  metroNIDAZOLE (FLAGYL) IVPB 500 mg  Status:  Discontinued     02/17/20 1123   02/15/20 1230  ceFEPIme (MAXIPIME) 2 g in sodium chloride 0.9 % 100 mL IVPB  Status:  Discontinued     02/18/20 1029   02/12/20 2030  Ampicillin-Sulbactam (UNASYN) 3 g in sodium chloride 0.9 % 100 mL IVPB  Status:  Discontinued     02/15/20 1153   02/12/20 2000  cefTRIAXone (ROCEPHIN) 1 g in sodium  chloride 0.9 % 100 mL IVPB  Status:  Discontinued     02/12/20 0451   02/12/20 1000  ceFEPIme (MAXIPIME) 2 g in sodium chloride 0.9 % 100 mL IVPB  Status:  Discontinued     02/12/20 1344   02/12/20 0700  metroNIDAZOLE (FLAGYL) IVPB 500 mg  Status:  Discontinued     02/12/20 0441   02/12/20 0545  vancomycin (VANCOREADY) IVPB 750 mg/150 mL  Status:  Discontinued     02/12/20 1344   02/11/20 2230  cefTRIAXone (ROCEPHIN) 2 g in sodium chloride 0.9 % 100 mL IVPB  Status:  Discontinued     02/12/20 0441   02/11/20 2230  azithromycin (ZITHROMAX) 500 mg in sodium chloride 0.9 % 250 mL IVPB  Status:  Discontinued     02/12/20 0441   02/11/20 2230  metroNIDAZOLE (FLAGYL) IVPB 500 mg     02/12/20 0204       Devices    LINES / TUBES:      Continuous Infusions: . sodium chloride Stopped (02/16/20 1146)     Objective: Vitals:   03/01/20 1400 03/01/20 2057 03/02/20 0555 03/02/20 0800  BP: 109/61 132/65 129/72 (!) 150/80  Pulse: (!) 106 (!) 102 (!) 104 100  Resp:  19 20 19   Temp: 98 F (  36.7 C) 98.1 F (36.7 C)  98.3 F (36.8 C)  TempSrc: Oral Oral    SpO2: 98% 100% 98% 96%  Weight:      Height:        Intake/Output Summary (Last 24 hours) at 03/02/2020 1059 Last data filed at 03/02/2020 0500 Gross per 24 hour  Intake 410 ml  Output 2250 ml  Net -1840 ml   Filed Weights   02/27/20 0459 02/28/20 0335 02/29/20 0346  Weight: 55.8 kg 54.4 kg 53.7 kg   Physical Exam:  General: A/O x4 no acute respiratory distress, cachectic Eyes: negative scleral hemorrhage, negative anisocoria, negative icterus ENT: Negative Runny nose, negative gingival bleeding, Neck:  Negative scars, masses, torticollis, lymphadenopathy, JVD Lungs: Tachypneic, clear to auscultation bilaterally without wheezes or crackles Cardiovascular: Regular rate and rhythm without murmur gallop or rub normal S1 and S2 Abdomen: negative abdominal pain, nondistended, positive soft, bowel sounds, no rebound, no  ascites, no appreciable mass Extremities: No significant cyanosis, clubbing, or edema bilateral lower extremities Skin: Decubitus ulcer stage II Psychiatric:  Negative depression, negative anxiety, negative fatigue, negative mania  Central nervous system:  Cranial nerves II through XII intact, tongue/uvula midline, all extremities muscle strength 3/4 /5, sensation intact throughout, negative dysarthria, negative expressive aphasia, negative receptive aphasia. .     Data Reviewed: Care during the described time interval was provided by me .  I have reviewed this patient's available data, including medical history, events of note, physical examination, and all test results as part of my evaluation.  CBC: Recent Labs  Lab 02/25/20 1237 02/26/20 1018 02/29/20 0305 03/01/20 0259 03/02/20 0313  WBC 10.2 7.8 8.2 8.0 8.9  NEUTROABS 9.1* 6.7  --  5.8 6.8  HGB 7.9* 8.1* 8.1* 8.2* 8.1*  HCT 24.4* 24.9* 25.0* 24.9* 25.2*  MCV 119.0* 119.7* 117.4* 117.5* 117.8*  PLT 227 230 286 304 323   Basic Metabolic Panel: Recent Labs  Lab 02/27/20 1003 02/27/20 1730 02/28/20 0919 02/29/20 0305 03/01/20 0259 03/02/20 0313  NA 140  --  139 138 137 138  K 3.1*  --  3.5 4.3 5.2* 3.7  CL 92*  --  96* 98 99 99  CO2 40*  --  35* 31 29 30   GLUCOSE 139*  --  149* 92 106* 98  BUN 8  --  12 12 14 12   CREATININE 0.44  --  0.46 0.35* 0.46 0.42*  CALCIUM 8.1*  --  8.6* 8.4* 8.7* 8.5*  MG  --  1.5*  --  1.9 1.7 1.9  PHOS  --   --   --   --  3.8 3.9   GFR: Estimated Creatinine Clearance: 54.7 mL/min (A) (by C-G formula based on SCr of 0.42 mg/dL (L)). Liver Function Tests: Recent Labs  Lab 03/01/20 0259 03/02/20 0313  AST 32 29  ALT 31 32  ALKPHOS 65 65  BILITOT 0.8 0.3  PROT 5.9* 5.9*  ALBUMIN 2.5* 2.5*   No results for input(s): LIPASE, AMYLASE in the last 168 hours. No results for input(s): AMMONIA in the last 168 hours. Coagulation Profile: No results for input(s): INR, PROTIME in the last  168 hours. Cardiac Enzymes: No results for input(s): CKTOTAL, CKMB, CKMBINDEX, TROPONINI in the last 168 hours. BNP (last 3 results) No results for input(s): PROBNP in the last 8760 hours. HbA1C: No results for input(s): HGBA1C in the last 72 hours. CBG: Recent Labs  Lab 02/28/20 1516 02/28/20 2048 02/28/20 2312 02/29/20 0322 02/29/20 0720  GLUCAP  126* 101* 138* 89 97   Lipid Profile: No results for input(s): CHOL, HDL, LDLCALC, TRIG, CHOLHDL, LDLDIRECT in the last 72 hours. Thyroid Function Tests: No results for input(s): TSH, T4TOTAL, FREET4, T3FREE, THYROIDAB in the last 72 hours. Anemia Panel: No results for input(s): VITAMINB12, FOLATE, FERRITIN, TIBC, IRON, RETICCTPCT in the last 72 hours. Sepsis Labs: No results for input(s): PROCALCITON, LATICACIDVEN in the last 168 hours.  No results found for this or any previous visit (from the past 240 hour(s)).       Radiology Studies: No results found.      Scheduled Meds: . apixaban  5 mg Oral BID  . bethanechol  5 mg Oral TID  . Chlorhexidine Gluconate Cloth  6 each Topical Daily  . feeding supplement (ENSURE ENLIVE)  237 mL Oral TID BM  . folic acid  1 mg Per Tube Daily  . furosemide  20 mg Intravenous Daily  . Gerhardt's butt cream   Topical TID  . guaiFENesin  10 mL Per Tube Q6H  . hydrALAZINE  25 mg Oral Q8H  . mouth rinse  15 mL Mouth Rinse BID  . multivitamin with minerals  1 tablet Per Tube Daily  . nicotine  21 mg Transdermal Daily  . pantoprazole  40 mg Oral BID  . polyethylene glycol  17 g Oral Daily  . thiamine  100 mg Per Tube Daily   Continuous Infusions: . sodium chloride Stopped (02/16/20 1146)     LOS: 19 days    Time spent:40 min    Maddux First, Geraldo Docker, MD [facility] Pager [phone]  If 7PM-7AM, please contact night-coverage [email or web address] Password [facility] 03/02/2020, 10:59 AM

## 2020-03-01 NOTE — Progress Notes (Signed)
Nutrition Follow-up  DOCUMENTATION CODES:   Not applicable  INTERVENTION:   -Continue Ensure Enlive po TID, each supplement provides 350 kcal and 20 grams of protein -MVI with minerals daily -D/c Osmolite 1.2, as pt with no feeding access  NUTRITION DIAGNOSIS:   Inadequate oral intake related to poor appetite, nausea, acute illness as evidenced by meal completion < 25%.  Ongoing  GOAL:   Patient will meet greater than or equal to 90% of their needs  Progressing   MONITOR:   PO intake, Supplement acceptance  REASON FOR ASSESSMENT:   Consult Enteral/tube feeding initiation and management  ASSESSMENT:   69 yo female with AMS and emesis with hypoxic respiratory failure with aspiration pneumonia, AKI, in shock . Pt with recent EGD for nausea, vomiting, weight loss over the last 2 years. PMH includes HTN, depression, anxiety  3/27-3/29: Intubated 3/30-4/01: Nectar thick clear liquids 4/06: Diet advanced to Dysphagia 2 with thin liquids by SLP 4/09 Diet advanced to Dysphagia 3 with thin liquids by SLP 4/13- cortrak tube removed 4/14- advanced to regular consistency diet  Reviewed I/O's: -3.6 L x 24 hours and -18.8 L since 02/16/20  UOP: 3.6 L x 24 hours  Pt sleeping soundly at time of visit. She did not respond to voice. Noted completed Ensure on tray table.   Cortrak removed; pt also voiced complains about TF exacerbating diarrhea. She is tolerating regular consistencies well and with improved oral intake; noted meal completion 10-100%.   Pt hopeful for CIR admission once bed is available.   Labs reviewed: K: 5.2, CBGS 89-138  Diet Order:   Diet Order            Diet regular Room service appropriate? Yes; Fluid consistency: Thin  Diet effective now              EDUCATION NEEDS:   Not appropriate for education at this time  Skin:  Skin Assessment: Skin Integrity Issues: Skin Integrity Issues:: Stage II Stage II: sacrum  Last BM:  02/28/20  Height:    Ht Readings from Last 1 Encounters:  02/16/20 5\' 2"  (1.575 m)    Weight:   Wt Readings from Last 1 Encounters:  02/29/20 53.7 kg   BMI:  Body mass index is 21.65 kg/m.  Estimated Nutritional Needs:   Kcal:  1700-1900 kcal  Protein:  90-105 grams  Fluid:  > 1.7 L    03/02/20, RD, LDN, CDCES Registered Dietitian II Certified Diabetes Care and Education Specialist Please refer to [facility] for RD and/or RD on-call/weekend/after hours pager

## 2020-03-02 ENCOUNTER — Encounter (HOSPITAL_COMMUNITY): Payer: Self-pay | Admitting: Student

## 2020-03-02 ENCOUNTER — Encounter (HOSPITAL_COMMUNITY): Payer: Self-pay | Admitting: Physical Medicine and Rehabilitation

## 2020-03-02 ENCOUNTER — Inpatient Hospital Stay (HOSPITAL_COMMUNITY)
Admission: RE | Admit: 2020-03-02 | Discharge: 2020-03-13 | DRG: 945 | Disposition: A | Discharge status: Home Health | Payer: PPO | Source: Intra-hospital | Attending: Physical Medicine and Rehabilitation | Admitting: Physical Medicine and Rehabilitation

## 2020-03-02 ENCOUNTER — Other Ambulatory Visit: Payer: Self-pay

## 2020-03-02 DIAGNOSIS — J9601 Acute respiratory failure with hypoxia: Secondary | ICD-10-CM | POA: Diagnosis not present

## 2020-03-02 DIAGNOSIS — G8929 Other chronic pain: Secondary | ICD-10-CM | POA: Diagnosis present

## 2020-03-02 DIAGNOSIS — K219 Gastro-esophageal reflux disease without esophagitis: Secondary | ICD-10-CM | POA: Diagnosis not present

## 2020-03-02 DIAGNOSIS — R159 Full incontinence of feces: Secondary | ICD-10-CM | POA: Diagnosis present

## 2020-03-02 DIAGNOSIS — E876 Hypokalemia: Secondary | ICD-10-CM | POA: Diagnosis present

## 2020-03-02 DIAGNOSIS — E785 Hyperlipidemia, unspecified: Secondary | ICD-10-CM | POA: Diagnosis not present

## 2020-03-02 DIAGNOSIS — J9621 Acute and chronic respiratory failure with hypoxia: Secondary | ICD-10-CM | POA: Diagnosis not present

## 2020-03-02 DIAGNOSIS — E7800 Pure hypercholesterolemia, unspecified: Secondary | ICD-10-CM | POA: Diagnosis present

## 2020-03-02 DIAGNOSIS — F1010 Alcohol abuse, uncomplicated: Secondary | ICD-10-CM | POA: Diagnosis present

## 2020-03-02 DIAGNOSIS — Z86711 Personal history of pulmonary embolism: Secondary | ICD-10-CM | POA: Diagnosis not present

## 2020-03-02 DIAGNOSIS — N319 Neuromuscular dysfunction of bladder, unspecified: Secondary | ICD-10-CM | POA: Diagnosis present

## 2020-03-02 DIAGNOSIS — R339 Retention of urine, unspecified: Secondary | ICD-10-CM | POA: Diagnosis present

## 2020-03-02 DIAGNOSIS — Z79899 Other long term (current) drug therapy: Secondary | ICD-10-CM

## 2020-03-02 DIAGNOSIS — F410 Panic disorder [episodic paroxysmal anxiety] without agoraphobia: Secondary | ICD-10-CM | POA: Diagnosis not present

## 2020-03-02 DIAGNOSIS — M48061 Spinal stenosis, lumbar region without neurogenic claudication: Secondary | ICD-10-CM | POA: Diagnosis not present

## 2020-03-02 DIAGNOSIS — I10 Essential (primary) hypertension: Secondary | ICD-10-CM

## 2020-03-02 DIAGNOSIS — K592 Neurogenic bowel, not elsewhere classified: Secondary | ICD-10-CM | POA: Diagnosis present

## 2020-03-02 DIAGNOSIS — I5033 Acute on chronic diastolic (congestive) heart failure: Secondary | ICD-10-CM | POA: Diagnosis present

## 2020-03-02 DIAGNOSIS — F17200 Nicotine dependence, unspecified, uncomplicated: Secondary | ICD-10-CM | POA: Diagnosis present

## 2020-03-02 DIAGNOSIS — A0472 Enterocolitis due to Clostridium difficile, not specified as recurrent: Secondary | ICD-10-CM | POA: Diagnosis not present

## 2020-03-02 DIAGNOSIS — I110 Hypertensive heart disease with heart failure: Secondary | ICD-10-CM | POA: Diagnosis not present

## 2020-03-02 DIAGNOSIS — D61818 Other pancytopenia: Secondary | ICD-10-CM | POA: Diagnosis not present

## 2020-03-02 DIAGNOSIS — N179 Acute kidney failure, unspecified: Secondary | ICD-10-CM | POA: Diagnosis present

## 2020-03-02 DIAGNOSIS — L89152 Pressure ulcer of sacral region, stage 2: Secondary | ICD-10-CM | POA: Clinically undetermined

## 2020-03-02 DIAGNOSIS — G894 Chronic pain syndrome: Secondary | ICD-10-CM | POA: Diagnosis not present

## 2020-03-02 DIAGNOSIS — G9341 Metabolic encephalopathy: Secondary | ICD-10-CM

## 2020-03-02 DIAGNOSIS — R5381 Other malaise: Secondary | ICD-10-CM | POA: Diagnosis not present

## 2020-03-02 DIAGNOSIS — J690 Pneumonitis due to inhalation of food and vomit: Secondary | ICD-10-CM | POA: Diagnosis not present

## 2020-03-02 DIAGNOSIS — M5136 Other intervertebral disc degeneration, lumbar region: Secondary | ICD-10-CM | POA: Diagnosis not present

## 2020-03-02 DIAGNOSIS — M4804 Spinal stenosis, thoracic region: Secondary | ICD-10-CM | POA: Diagnosis not present

## 2020-03-02 LAB — COMPREHENSIVE METABOLIC PANEL
ALT: 32 U/L (ref 0–44)
AST: 29 U/L (ref 15–41)
Albumin: 2.5 g/dL — ABNORMAL LOW (ref 3.5–5.0)
Alkaline Phosphatase: 65 U/L (ref 38–126)
Anion gap: 9 (ref 5–15)
BUN: 12 mg/dL (ref 8–23)
CO2: 30 mmol/L (ref 22–32)
Calcium: 8.5 mg/dL — ABNORMAL LOW (ref 8.9–10.3)
Chloride: 99 mmol/L (ref 98–111)
Creatinine, Ser: 0.42 mg/dL — ABNORMAL LOW (ref 0.44–1.00)
GFR calc Af Amer: >60 mL/min (ref >60)
GFR calc non Af Amer: >60 mL/min (ref >60)
Glucose, Bld: 98 mg/dL (ref 70–99)
Potassium: 3.7 mmol/L (ref 3.5–5.1)
Sodium: 138 mmol/L (ref 135–145)
Total Bilirubin: 0.3 mg/dL (ref 0.3–1.2)
Total Protein: 5.9 g/dL — ABNORMAL LOW (ref 6.5–8.1)

## 2020-03-02 LAB — CBC WITH DIFFERENTIAL/PLATELET
Abs Immature Granulocytes: 0.03 10*3/uL (ref 0.00–0.07)
Basophils Absolute: 0 10*3/uL (ref 0.0–0.1)
Basophils Relative: 0 %
Eosinophils Absolute: 0.3 10*3/uL (ref 0.0–0.5)
Eosinophils Relative: 3 %
HCT: 25.2 % — ABNORMAL LOW (ref 36.0–46.0)
Hemoglobin: 8.1 g/dL — ABNORMAL LOW (ref 12.0–15.0)
Immature Granulocytes: 0 %
Lymphocytes Relative: 12 %
Lymphs Abs: 1.1 10*3/uL (ref 0.7–4.0)
MCH: 37.9 pg — ABNORMAL HIGH (ref 26.0–34.0)
MCHC: 32.1 g/dL (ref 30.0–36.0)
MCV: 117.8 fL — ABNORMAL HIGH (ref 80.0–100.0)
Monocytes Absolute: 0.7 10*3/uL (ref 0.1–1.0)
Monocytes Relative: 8 %
Neutro Abs: 6.8 10*3/uL (ref 1.7–7.7)
Neutrophils Relative %: 77 %
Platelets: 323 10*3/uL (ref 150–400)
RBC: 2.14 MIL/uL — ABNORMAL LOW (ref 3.87–5.11)
RDW: 13.5 % (ref 11.5–15.5)
WBC: 8.9 10*3/uL (ref 4.0–10.5)
nRBC: 0 % (ref 0.0–0.2)

## 2020-03-02 LAB — MAGNESIUM: Magnesium: 1.9 mg/dL (ref 1.7–2.4)

## 2020-03-02 LAB — PHOSPHORUS: Phosphorus: 3.9 mg/dL (ref 2.5–4.6)

## 2020-03-02 MED ORDER — ZOLPIDEM TARTRATE 5 MG PO TABS: 5.0000 mg | ORAL_TABLET | Freq: Every evening | ORAL | 0 refills | Status: DC | PRN

## 2020-03-02 MED ORDER — POLYETHYLENE GLYCOL 3350 17 G PO PACK
17.0000 g | PACK | Freq: Every day | ORAL | Status: DC
  Filled 2020-03-02 (×6): qty 1

## 2020-03-02 MED ORDER — HYDRALAZINE HCL 25 MG PO TABS
25.0000 mg | ORAL_TABLET | Freq: Three times a day (TID) | ORAL | Status: DC
  Administered 2020-03-02 – 2020-03-13 (×31): 25 mg via ORAL
  Filled 2020-03-02 (×32): qty 1

## 2020-03-02 MED ORDER — ONDANSETRON HCL 4 MG/2ML IJ SOLN: 4.0000 mg | Freq: Four times a day (QID) | INTRAMUSCULAR | Status: DC | PRN

## 2020-03-02 MED ORDER — NICOTINE 21 MG/24HR TD PT24
21.0000 mg | MEDICATED_PATCH | Freq: Every day | TRANSDERMAL | Status: DC
  Administered 2020-03-03 – 2020-03-13 (×11): 21 mg via TRANSDERMAL
  Filled 2020-03-02 (×11): qty 1

## 2020-03-02 MED ORDER — NICOTINE 21 MG/24HR TD PT24: 21.0000 mg | MEDICATED_PATCH | Freq: Every day | TRANSDERMAL | 0 refills | Status: DC

## 2020-03-02 MED ORDER — GERHARDT'S BUTT CREAM: 1.0000 "application " | TOPICAL_CREAM | Freq: Three times a day (TID) | CUTANEOUS | 0 refills | Status: DC

## 2020-03-02 MED ORDER — SORBITOL 70 % SOLN: 30.0000 mL | Freq: Every day | Status: DC | PRN

## 2020-03-02 MED ORDER — ENSURE ENLIVE PO LIQD
237.0000 mL | Freq: Three times a day (TID) | ORAL | Status: DC
  Administered 2020-03-02 – 2020-03-12 (×23): 237 mL via ORAL

## 2020-03-02 MED ORDER — ONDANSETRON HCL 4 MG PO TABS: 4.0000 mg | ORAL_TABLET | Freq: Four times a day (QID) | ORAL | 0 refills | Status: DC | PRN

## 2020-03-02 MED ORDER — CHLORHEXIDINE GLUCONATE CLOTH 2 % EX PADS
6.0000 | MEDICATED_PAD | Freq: Two times a day (BID) | CUTANEOUS | Status: DC
  Administered 2020-03-02 – 2020-03-13 (×22): 6 via TOPICAL

## 2020-03-02 MED ORDER — HYDRALAZINE HCL 20 MG/ML IJ SOLN: 10.0000 mg | Freq: Four times a day (QID) | INTRAMUSCULAR | 0 refills | Status: DC | PRN

## 2020-03-02 MED ORDER — ADULT MULTIVITAMIN W/MINERALS CH: 1.0000 | ORAL_TABLET | Freq: Every day | ORAL | 0 refills | Status: DC

## 2020-03-02 MED ORDER — PANTOPRAZOLE SODIUM 40 MG PO TBEC
40.0000 mg | DELAYED_RELEASE_TABLET | Freq: Two times a day (BID) | ORAL | Status: DC
  Administered 2020-03-02 – 2020-03-13 (×22): 40 mg via ORAL
  Filled 2020-03-02 (×22): qty 1

## 2020-03-02 MED ORDER — POLYETHYLENE GLYCOL 3350 17 G PO PACK: 17.0000 g | PACK | Freq: Every day | ORAL | 0 refills | Status: DC

## 2020-03-02 MED ORDER — ADULT MULTIVITAMIN W/MINERALS CH
1.0000 | ORAL_TABLET | Freq: Every day | ORAL | Status: DC
  Administered 2020-03-04 – 2020-03-13 (×6): 1
  Filled 2020-03-02 (×10): qty 1

## 2020-03-02 MED ORDER — OXYCODONE HCL 5 MG PO TABS
5.0000 mg | ORAL_TABLET | Freq: Two times a day (BID) | ORAL | Status: DC
  Administered 2020-03-03 – 2020-03-13 (×20): 5 mg via ORAL
  Filled 2020-03-02 (×22): qty 1

## 2020-03-02 MED ORDER — ALPRAZOLAM 0.25 MG PO TABS
0.5000 mg | ORAL_TABLET | Freq: Two times a day (BID) | ORAL | Status: DC | PRN
  Administered 2020-03-02 – 2020-03-12 (×11): 0.5 mg via ORAL
  Filled 2020-03-02 (×11): qty 2

## 2020-03-02 MED ORDER — FOLIC ACID 1 MG PO TABS: 1.0000 mg | ORAL_TABLET | Freq: Every day | ORAL | 0 refills | Status: DC

## 2020-03-02 MED ORDER — FUROSEMIDE 20 MG PO TABS
20.0000 mg | ORAL_TABLET | Freq: Every day | ORAL | Status: DC
  Administered 2020-03-03 – 2020-03-13 (×11): 20 mg via ORAL
  Filled 2020-03-02 (×11): qty 1

## 2020-03-02 MED ORDER — ALPRAZOLAM 0.5 MG PO TABS: 0.5000 mg | ORAL_TABLET | Freq: Two times a day (BID) | ORAL | 0 refills | Status: DC | PRN

## 2020-03-02 MED ORDER — ACETAMINOPHEN 325 MG PO TABS
650.0000 mg | ORAL_TABLET | Freq: Four times a day (QID) | ORAL | Status: DC | PRN
  Administered 2020-03-02 – 2020-03-03 (×2): 650 mg via ORAL
  Filled 2020-03-02 (×3): qty 2

## 2020-03-02 MED ORDER — ORAL CARE MOUTH RINSE: 15.0000 mL | Freq: Two times a day (BID) | OROMUCOSAL | 0 refills | Status: DC

## 2020-03-02 MED ORDER — APIXABAN 5 MG PO TABS
5.0000 mg | ORAL_TABLET | Freq: Two times a day (BID) | ORAL | Status: DC
  Administered 2020-03-02 – 2020-03-13 (×22): 5 mg via ORAL
  Filled 2020-03-02 (×22): qty 1

## 2020-03-02 MED ORDER — ONDANSETRON HCL 4 MG PO TABS: 4.0000 mg | ORAL_TABLET | Freq: Four times a day (QID) | ORAL | Status: DC | PRN

## 2020-03-02 MED ORDER — FOLIC ACID 1 MG PO TABS
1.0000 mg | ORAL_TABLET | Freq: Every day | ORAL | Status: DC
  Administered 2020-03-03 – 2020-03-13 (×10): 1 mg
  Filled 2020-03-02 (×11): qty 1

## 2020-03-02 MED ORDER — OXYCODONE HCL 5 MG PO TABS
5.0000 mg | ORAL_TABLET | Freq: Four times a day (QID) | ORAL | Status: DC | PRN
  Administered 2020-03-02 – 2020-03-13 (×20): 5 mg via ORAL
  Filled 2020-03-02 (×19): qty 1

## 2020-03-02 MED ORDER — BETHANECHOL CHLORIDE 5 MG PO TABS: 5.0000 mg | ORAL_TABLET | Freq: Three times a day (TID) | ORAL | 0 refills | Status: DC

## 2020-03-02 MED ORDER — APIXABAN 5 MG PO TABS: 5.0000 mg | ORAL_TABLET | Freq: Two times a day (BID) | ORAL | 0 refills | Status: DC

## 2020-03-02 MED ORDER — GERHARDT'S BUTT CREAM
1.0000 "application " | TOPICAL_CREAM | Freq: Three times a day (TID) | CUTANEOUS | Status: DC
  Administered 2020-03-02 – 2020-03-13 (×32): 1 via TOPICAL
  Filled 2020-03-02: qty 1

## 2020-03-02 MED ORDER — BETHANECHOL CHLORIDE 10 MG PO TABS
5.0000 mg | ORAL_TABLET | Freq: Three times a day (TID) | ORAL | Status: DC
  Administered 2020-03-02 – 2020-03-10 (×26): 5 mg via ORAL
  Filled 2020-03-02 (×26): qty 1

## 2020-03-02 MED ORDER — ALBUTEROL SULFATE (2.5 MG/3ML) 0.083% IN NEBU: 2.5000 mg | INHALATION_SOLUTION | RESPIRATORY_TRACT | Status: DC | PRN

## 2020-03-02 MED ORDER — ACETAMINOPHEN 325 MG PO TABS: 650.0000 mg | ORAL_TABLET | Freq: Four times a day (QID) | ORAL | 0 refills | Status: DC | PRN

## 2020-03-02 MED ORDER — BISACODYL 10 MG RE SUPP: 10.0000 mg | Freq: Every day | RECTAL | 0 refills | Status: DC | PRN

## 2020-03-02 MED ORDER — ACETAMINOPHEN 650 MG RE SUPP: 650.0000 mg | Freq: Four times a day (QID) | RECTAL | Status: DC | PRN

## 2020-03-02 MED ORDER — FUROSEMIDE 10 MG/ML IJ SOLN: 20.0000 mg | Freq: Every day | INTRAMUSCULAR | 0 refills | Status: DC

## 2020-03-02 MED ORDER — THIAMINE HCL 100 MG PO TABS: 100.0000 mg | ORAL_TABLET | Freq: Every day | ORAL | 0 refills | Status: DC

## 2020-03-02 MED ORDER — THIAMINE HCL 100 MG PO TABS
100.0000 mg | ORAL_TABLET | Freq: Every day | ORAL | Status: DC
  Administered 2020-03-03 – 2020-03-13 (×11): 100 mg
  Filled 2020-03-02 (×11): qty 1

## 2020-03-02 MED ORDER — LEVALBUTEROL HCL 0.63 MG/3ML IN NEBU: 0.6300 mg | INHALATION_SOLUTION | RESPIRATORY_TRACT | 12 refills | Status: DC | PRN

## 2020-03-02 MED ORDER — BISACODYL 10 MG RE SUPP: 10.0000 mg | Freq: Every day | RECTAL | Status: DC | PRN

## 2020-03-02 MED ORDER — HYDRALAZINE HCL 25 MG PO TABS: 25.0000 mg | ORAL_TABLET | Freq: Three times a day (TID) | ORAL | 0 refills | Status: DC

## 2020-03-02 NOTE — PMR Pre-admission (Signed)
PMR Admission Coordinator Pre-Admission Assessment  Patient: Dawn Beasley is an 69 y.o., female MRN: [id number] DOB: [birth year] Height: _0  (157.5 cm) Weight: 53.7 kg  Insurance Information HMO:     PPO: yes     PCP:      IPA:      80/20:      OTHER:  PRIMARY: [employer]      Policy#: [id number]      Subscriber: pt CM Name: Dawn Beasley      Phone#: [phone]     Fax#: EPIC access Pre-Cert#: [id number]      Employer:  Benefits:  Phone #: [phone]     Name:  Dawn Beasley. Date: 02/15/2018     Deduct: $0      Out of Pocket Max: $3400 ($30 met)      Life Max: n/a CIR: $295/day for days 1-6      SNF: 20 full days Outpatient:      Co-Pay: $15/visit Home Health: 100%      Co-Pay:  DME: 80%     Co-Pay: 20% Providers: preferred network SECONDARY:       Policy#:      Phone#:   Development worker, community:       Phone#:   The Engineer, petroleum" for patients in Inpatient Rehabilitation Facilities with attached "Privacy Act [place] Records" was provided and verbally reviewed with: Patient and Family  Emergency Contact Information Contact Information    Name Relation Home Work Mobile   Beasley,Dawn Spouse   [phone]      Current Medical History  Patient Admitting Diagnosis: debility, respiratory failure, aspiration PNA  History of Present Illness: Dawn Beasley. Dawn Beasley is a 70 year old right-handed female with history of thoracic aortic aneurysm, chronic gastric dysmotility, hyperlipidemia, tobacco/alcohol abuse, hypertension, panic disorder, chronic back pain lumbar spinal cord stimulator insertion 12/06/2015 per Dr. Rolena Beasley.    Presented 02/11/2020 after witnessed syncopal event.  Patient reportedly had a recent endoscopy colonoscopy on 02/06/2020 with findings of a single localized nonbleeding erosion found in the cecum as well as colitis and reactive gastropathy.  No stigmata of recent bleeding was seen.    Patient had episode of vomiting was orthostatic received bolus of IV  fluids.  Noted oxygen saturation 78% on room air started on 4 L..  Admission chemistry with hemoglobin 8.6, WBC 1.3, platelet 141,000, lactic acid 2.3, sodium 127, BUN 50, creatinine 1.69, D-dimer 2.15, troponin high-sensitivity 523, CK 1746, blood cultures no growth to date.  Cranial CT scan showed no acute hemorrhage.  Small vessel ischemic changes throughout the periventricular white matter left basal ganglia, likely chronic.  CT angiogram of the chest showed acute pulmonary embolus within the segmental branch of the right lower lobe pulmonary artery.  Interval development of bilateral pleural effusions left greater than right.  Stable appearance of ascending thoracic aortic aneurysm 4.1 cm.  Patient did require intubation for airway protection and extubated 02/13/2020.  She was placed on Eliquis after findings of pulmonary emboli.  Subsequent echocardiogram did not show any heart strain.  She remained in the ICU for acute metabolic encephalopathy/septic shock.  She was transferred out of the ICU 02/24/2020.  Hospital course complicated by mild acute on chronic diastolic congestive heart failure started on IV Lasix with good diuresis.  Patient had been maintained on broad-spectrum antibiotics x10 days and since discontinued.  Her renal function has steadily improved after gentle IV fluids latest creatinine 0.42.  Pancytopenia hemoglobin 8.1, WBC improved 8900, RBC 2.14.  Her diet  has been advanced to regular consistency.  Her mental status has continued to improve and a follow-up cranial CT scan was completed 02/15/2020 again negative for acute changes suspect acute metabolic encephalopathy.  Therapy evaluations completed and patient was recommended for a comprehensive rehab program.    Patient's medical record from [facility] has been reviewed by the rehabilitation admission coordinator and physician.  Past Medical History  Past Medical History:  Diagnosis Date  . AAA (abdominal aortic aneurysm)  ([place])   . Anxiety   . Arthritis   . Back pain   . Cervical disc herniation   . Depression   . 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 Hypercholesteremia   . Hypertension   . Panic disorder   . Stenosis, spinal, lumbar     Family History   family history includes Cancer in her sister.  Prior Rehab/Hospitalizations Has the patient had prior rehab or hospitalizations prior to admission? No  Has the patient had major surgery during 100 days prior to admission? No   Current Medications  Current Facility-Administered Medications:  .  0.9 %  sodium chloride infusion, , Intravenous, PRN, Dawn Poisson, MD, Stopped at 02/16/20 1146 .  acetaminophen (TYLENOL) tablet 650 mg, 650 mg, Oral, Q6H PRN, 650 mg at 02/24/20 2150 **OR** acetaminophen (TYLENOL) suppository 650 mg, 650 mg, Rectal, Q6H PRN, Dawn Poisson, MD .  ALPRAZolam Duanne Moron) tablet 0.5 mg, 0.5 mg, Oral, BID PRN, Dawn Poisson, MD, 0.5 mg at 03/01/20 0516 .  apixaban (ELIQUIS) tablet 5 mg, 5 mg, Oral, BID, Dawn Poisson, MD, 5 mg at 03/02/20 0923 .  bethanechol (URECHOLINE) tablet 5 mg, 5 mg, Oral, TID, Dawn Poisson, MD, 5 mg at 03/02/20 0924 .  bisacodyl (DULCOLAX) suppository 10 mg, 10 mg, Rectal, Daily PRN, Dawn Poisson, MD .  Chlorhexidine Gluconate Cloth 2 % PADS 6 each, 6 each, Topical, Daily, Dawn Poisson, MD, 6 each at 03/02/20 0924 .  feeding supplement (ENSURE ENLIVE) (ENSURE ENLIVE) liquid 237 mL, 237 mL, Oral, TID BM, Dawn Poisson, MD, 237 mL at 03/02/20 0924 .  folic acid (FOLVITE) tablet 1 mg, 1 mg, Per Tube, Daily, Dawn Poisson, MD, 1 mg at 03/02/20 0923 .  furosemide (LASIX) injection 20 mg, 20 mg, Intravenous, Daily, Dawn Poisson, MD, 20 mg at 03/02/20 0923 .  Dawn Beasley's butt cream, , Topical, TID, Dawn Poisson, MD, Given at 03/02/20 [phone] .  guaiFENesin (ROBITUSSIN) 100 MG/5ML solution 200 mg, 10 mL, Per Tube, Q6H, Dawn Poisson, MD, 200 mg at 03/02/20 0923 .  hydrALAZINE (APRESOLINE) injection 10 mg, 10 mg,  Intravenous, Q6H PRN, Dawn Poisson, MD .  hydrALAZINE (APRESOLINE) tablet 25 mg, 25 mg, Oral, Q8H, Dawn Poisson, MD, 25 mg at 03/02/20 9458 .  levalbuterol (XOPENEX) nebulizer solution 0.63 mg, 0.63 mg, Nebulization, Q2H PRN, Dawn Poisson, MD, 0.63 mg at 02/16/20 1217 .  MEDLINE mouth rinse, 15 mL, Mouth Rinse, BID, Dawn Poisson, MD, 15 mL at 03/02/20 0924 .  multivitamin with minerals tablet 1 tablet, 1 tablet, Per Tube, Daily, Dawn Poisson, MD, 1 tablet at 03/02/20 0923 .  nicotine (NICODERM CQ - dosed in mg/24 hours) patch 21 mg, 21 mg, Transdermal, Daily, Dawn Poisson, MD, 21 mg at 03/02/20 0923 .  ondansetron (ZOFRAN) tablet 4 mg, 4 mg, Oral, Q6H PRN **OR** ondansetron (ZOFRAN) injection 4 mg, 4 mg, Intravenous, Q6H PRN, Dawn Poisson, MD .  oxyCODONE (Oxy IR/ROXICODONE) immediate release tablet 5 mg, 5 mg, Oral, Q6H PRN, Dawn Poisson, MD, 5 mg at 03/02/20 0926 .  pantoprazole (PROTONIX) EC tablet 40 mg, 40 mg, Oral, BID, Allie Bossier, MD, 40 mg at 03/02/20 0177 .  polyethylene glycol (MIRALAX / GLYCOLAX) packet 17 g, 17 g, Oral, Daily, Dawn Poisson, MD, 17 g at 03/02/20 0922 .  Resource ThickenUp Clear, , Oral, PRN, Dawn Poisson, MD .  thiamine tablet 100 mg, 100 mg, Per Tube, Daily, Dawn Poisson, MD, 100 mg at 03/02/20 0923 .  zolpidem (AMBIEN) tablet 5 mg, 5 mg, Oral, QHS PRN, Dawn Poisson, MD, 5 mg at 03/01/20 2254  Patients Current Diet:  Diet Order            Diet regular Room service appropriate? Yes; Fluid consistency: Thin  Diet effective now              Precautions / Restrictions Precautions Precautions: Fall Precaution Comments: watch resp status Restrictions Weight Bearing Restrictions: No   Has the patient had 2 or more falls or a fall with injury in the past year? No  Prior Activity Level Limited Community (1-2x/wk): no DME at baseline, no falls  Prior Functional Level Self Care: Did the patient need help bathing, dressing, using the toilet or  eating? Independent  Indoor Mobility: Did the patient need assistance with walking from room to room (with or without device)? Independent  Stairs: Did the patient need assistance with internal or external stairs (with or without device)? Independent  Functional Cognition: Did the patient need help planning regular tasks such as shopping or remembering to take medications? Independent  Home Assistive Devices / Equipment Home Equipment: Cane - single point  Prior Device Use: Indicate devices/aids used by the patient prior to current illness, exacerbation or injury? None of the above  Current Functional Level Cognition  Overall Cognitive Status: Within Functional Limits for tasks assessed Current Attention Level: Sustained Orientation Level: Oriented X4 Following Commands: Follows one step commands with increased time, Follows one step commands inconsistently Safety/Judgement: Decreased awareness of safety, Decreased awareness of deficits General Comments: A&O x 4. Pt able to follow one step commands without difficulty.    Extremity Assessment (includes Sensation/Coordination)  Upper Extremity Assessment: Generalized weakness  Lower Extremity Assessment: Generalized weakness    ADLs  Overall ADL's : Needs assistance/impaired Eating/Feeding: NPO Grooming: Wash/dry hands, Wash/dry face, Oral care, Brushing hair, Set up, Supervision/safety, Sitting Upper Body Bathing: Set up, Supervision/ safety, Sitting Lower Body Bathing: Supervison/ safety, Min guard, Sit to/from stand Lower Body Bathing Details (indicate cue type and reason): Supervision while seated, min guard while standing to complete Upper Body Dressing : Moderate assistance, Sitting Lower Body Dressing: Set up, Supervision/safety, Sitting/lateral leans Lower Body Dressing Details (indicate cue type and reason): While seated in bedside chair. Figure four position.  Toilet Transfer: +2 for safety/equipment, Stand-pivot, BSC, +2  for physical assistance, Moderate assistance Toilet Transfer Details (indicate cue type and reason): max A to boost up hips and remain steady Toileting- Clothing Manipulation and Hygiene: +2 for physical assistance, +2 for safety/equipment, Maximal assistance Toileting - Clothing Manipulation Details (indicate cue type and reason): bed level peri care after BM in bed Functional mobility during ADLs: Min guard, Rolling walker General ADL Comments: Pt tolerated standing 1 x 1 min with min guard to complete peri care. Pt engaged in seated self-care tasks.     Mobility  Overal bed mobility: Needs Assistance Bed Mobility: Supine to Sit Rolling: Min assist Sidelying to sit: Min guard Supine to sit: HOB elevated Sit to supine: Max assist General bed mobility comments: Pt seated  in bedside chair upon OT arrival    Transfers  Overall transfer level: Needs assistance Equipment used: Rolling walker (2 wheeled) Transfers: Sit to/from Stand Sit to Stand: Min guard Stand pivot transfers: Min assist, +2 physical assistance, +2 safety/equipment General transfer comment: To ensure balance and safety    Ambulation / Gait / Stairs / Wheelchair Mobility  Ambulation/Gait Ambulation/Gait assistance: Min Web designer (Feet): 55 Feet(x2) Assistive device: Rolling walker (2 wheeled) Gait Pattern/deviations: Step-through pattern General Gait Details: continues with mild unsteadiness, drifting and waggle.  Noticeably fatiguing  HR rising in to the low 130's  sats 94%.  All on 3L [place] Gait velocity: decr Gait velocity interpretation: <1.8 ft/sec, indicate of risk for recurrent falls    Posture / Balance Dynamic Sitting Balance Sitting balance - Comments: sat with no UE's and reached outside BOS with each hand from left to right to give "high fives" Balance Overall balance assessment: Needs assistance Sitting-balance support: Bilateral upper extremity supported, Feet supported Sitting balance-Leahy  Scale: Good Sitting balance - Comments: sat with no UE's and reached outside BOS with each hand from left to right to give "high fives" Postural control: Right lateral lean Standing balance support: Bilateral upper extremity supported Standing balance-Leahy Scale: Fair Standing balance comment: reliant on AD and external support    Special needs/care consideration Oxygen 2L, Skin blisters to L hand Designated visitor spouse, [place] (from acute therapy documentation) Living Arrangements: Spouse/significant other Available Help at Discharge: Family, Available PRN/intermittently Type of Home: House Home Layout: Two level, Able to live on main level with bedroom/bathroom Alternate Level Stairs-Rails: Left Alternate Level Stairs-Number of Steps: flight Home Access: Stairs to enter Technical brewer of Steps: 2 Bathroom Shower/Tub: Optometrist: Yes  Discharge Living Setting Plans for Discharge Living Setting: Patient's home Type of Home at Discharge: House Discharge Home Layout: Two level, Able to live on main level with bedroom/bathroom Alternate Level Stairs-Rails: Left Alternate Level Stairs-Number of Steps: flight Discharge Home Access: Stairs to enter Entrance Stairs-Rails: None Entrance Stairs-Number of Steps: 2 Discharge Bathroom Shower/Tub: Tub/shower unit Discharge Bathroom Toilet: Standard Discharge Bathroom Accessibility: Yes How Accessible: Accessible via walker Does the patient have any problems obtaining your medications?: No  Social/Family/Support Systems Patient Roles: Spouse Anticipated Caregiver: Alainna Stawicki (spouse) Anticipated Caregiver's Contact Information: [phone] cell Ability/Limitations of Caregiver: works Careers adviser: Evenings only Discharge Plan Discussed with Primary Caregiver: Yes Is Caregiver In Agreement with Plan?: Yes Does Caregiver/Family have  Issues with Lodging/Transportation while Pt is in Rehab?: No  Goals Patient/Family Goal for Rehab: PT/OT supervision to mod I Expected length of stay: 10-12 days Pt/Family Agrees to Admission and willing to participate: Yes Program Orientation Provided & Reviewed with Pt/Caregiver Including Roles  & Responsibilities: Yes  Barriers to Discharge: Decreased caregiver support  Barriers to Discharge Comments: husband works  Decrease burden of Care through IP rehab admission: n/a  Possible need for SNF placement upon discharge: not anticipated  Patient Condition: I have reviewed medical records from [facility], spoken with CM, and patient and spouse. I met with patient at the bedside for inpatient rehabilitation assessment.  Patient will benefit from ongoing PT, OT  can actively participate in 3 hours of therapy a day 5 days of the week, and can make measurable gains during the admission.  Patient will also benefit from the coordinated team approach during an Inpatient Acute Rehabilitation admission.  The patient will receive intensive therapy as well  as Rehabilitation physician, nursing, social worker, and care management interventions.  Due to safety, skin/wound care, medication administration, pain management and patient education the patient requires 24 hour a day rehabilitation nursing.  The patient is currently min assist with mobility and basic ADLs.  Discharge setting and therapy post discharge at home is anticipated.  Patient has agreed to participate in the Acute Inpatient Rehabilitation Program and will admit today.  Preadmission Screen Completed By:  Michel Santee, PT, DPT 03/02/2020 10:37 AM ______________________________________________________________________   Discussed status with Dr. Naaman Plummer on 03/02/20  at 10:44 AM  and received approval for admission today.  Admission Coordinator:  Michel Santee, PT, DPT time 10:44 AM Sudie Grumbling 03/02/20    Assessment/Plan: Diagnosis:  debility after aspiration pneumonia and respiratory failure 1. Does the need for close, 24 hr/day Medical supervision in concert with the patient's rehab needs make it unreasonable for this patient to be served in a less intensive setting? Yes 2. Co-Morbidities requiring supervision/potential complications: HTN, chronic low back pain, gastric dysmotility, cdCHF, PE 3. Due to bladder management, bowel management, safety, skin/wound care, disease management, medication administration, pain management and patient education, does the patient require 24 hr/day rehab nursing? Yes 4. Does the patient require coordinated care of a physician, rehab nurse, PT, OT to address physical and functional deficits in the context of the above medical diagnosis(es)? Yes Addressing deficits in the following areas: balance, endurance, locomotion, strength, transferring, bowel/bladder control, bathing, dressing, feeding, grooming, toileting and psychosocial support 5. Can the patient actively participate in an intensive therapy program of at least 3 hrs of therapy 5 days a week? Yes 6. The potential for patient to make measurable gains while on inpatient rehab is excellent 7. Anticipated functional outcomes upon discharge from inpatient rehab: modified independent and supervision PT, modified independent and supervision OT, n/a SLP 8. Estimated rehab length of stay to reach the above functional goals is: 10-12 days 9. Anticipated discharge destination: Home 10. Overall Rehab/Functional Prognosis: excellent   MD Signature: Meredith Staggers, MD, [facility] 03/02/2020

## 2020-03-02 NOTE — Progress Notes (Signed)
Patient ID: Dawn Beasley, female   DOB: 1951-09-21, 69 y.o.   MRN: [id number] Patient and spouse arrived from 2W23 with RN and patient belongings. Patient oriented to nurse call system, rehab process, rehab schedule, fall prevention plan, safety plan, and health resource notebook with verbal understanding. Patient is resting in bed with bed alarm on, call bell within reach, and husband at bedside.

## 2020-03-02 NOTE — H&P (Signed)
Physical Medicine and Rehabilitation Admission H&P    Chief Complaint  Patient presents with  . Near Syncope  : HPI: Dawn Beasley. Massimo is a 69 year old right-handed female with history of thoracic aortic aneurysm, chronic gastric dysmotility, hyperlipidemia, tobacco/alcohol abuse, hypertension, panic disorder, chronic back pain lumbar spinal cord stimulator insertion 12/06/2015 per Dr. Shon Baton.  Per chart review patient lives with spouse independent prior to admission a bit sedentary.  Two-level home bed and bath on main level 2 steps to entry.  Husband can assist as needed.  Presented 02/11/2020 after witnessed syncopal event.  Patient reportedly had a recent endoscopy colonoscopy on 02/06/2020 with findings of a single localized nonbleeding erosion found in the cecum as well as colitis and reactive gastropathy.  No stigmata of recent bleeding was seen.    Patient had episode of vomiting was orthostatic received bolus of IV fluids.  Noted oxygen saturation 78% on room air started on 4 L..  Admission chemistry with hemoglobin 8.6, WBC 1.3, platelet 141,000, lactic acid 2.3, sodium 127, BUN 50, creatinine 1.69, D-dimer 2.15, troponin high-sensitivity 523, CK 1746, blood cultures no growth to date.  Cranial CT scan showed no acute hemorrhage.  Small vessel ischemic changes throughout the periventricular white matter left basal ganglia, likely chronic.  CT angiogram of the chest showed acute pulmonary embolus within the segmental branch of the right lower lobe pulmonary artery.  Interval development of bilateral pleural effusions left greater than right.  Stable appearance of ascending thoracic aortic aneurysm 4.1 cm.  Patient did require intubation for airway protection and extubated 02/13/2020.  She was placed on Eliquis after findings of pulmonary emboli.  Subsequent echocardiogram did not show any heart strain.  She remained in the ICU for acute metabolic encephalopathy/septic shock.  She was transferred out  of the ICU 02/24/2020.  Hospital course complicated by mild acute on chronic diastolic congestive heart failure started on IV Lasix with good diuresis.  Patient had been maintained on broad-spectrum antibiotics x10 days and since discontinued.  Her renal function has steadily improved after gentle IV fluids latest creatinine 0.42.  Pancytopenia hemoglobin 8.1, WBC improved 8900, RBC 2.14.  Her diet has been advanced to regular consistency.  Her mental status has continued to improve and a follow-up cranial CT scan was completed 02/15/2020 again negative for acute changes suspect acute metabolic encephalopathy.  Therapy evaluations completed and patient was admitted for a comprehensive rehab program.  Review of Systems  Constitutional: Negative for chills and fever.       Limited p.o. intake  HENT: Negative for hearing loss.   Eyes: Negative for blurred vision and double vision.  Respiratory: Positive for shortness of breath. Negative for cough.   Cardiovascular: Positive for leg swelling. Negative for chest pain and palpitations.  Gastrointestinal: Positive for nausea and vomiting. Negative for heartburn.       Hematemesis  Genitourinary: Negative for dysuria, flank pain and hematuria.  Musculoskeletal: Positive for back pain, joint pain and myalgias.  Skin: Negative for rash.  Neurological: Positive for weakness.  Psychiatric/Behavioral: The patient has insomnia.        Panic disorder  All other systems reviewed and are negative.  Past Medical History:  Diagnosis Date  . AAA (abdominal aortic aneurysm) (HCC)   . Anxiety   . Arthritis   . Back pain   . Cervical disc herniation   . Depression   . 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 Hypercholesteremia   . Hypertension   . Panic  disorder   . Stenosis, spinal, lumbar    Past Surgical History:  Procedure Laterality Date  . APPENDECTOMY    . BACK SURGERY    . CARPAL TUNNEL RELEASE    . LUMBAR LAMINECTOMY/DECOMPRESSION MICRODISCECTOMY N/A  04/12/2014   Procedure: MICRO LUMBAR DECOMPRESSION LUMBAR THREE TO FOUR,LUMBAR  FOUR TO FIVE;  Surgeon: Javier Docker, MD;  Location: [facility];  Service: Orthopedics;  Laterality: N/A;  . MAXOFACIAL SURGERY    . SPINAL CORD STIMULATOR INSERTION N/A 12/06/2015   Procedure: LUMBAR SPINAL CORD STIMULATOR INSERTION;  Surgeon: Venita Lick, MD;  Location: [facility] OR;  Service: Orthopedics;  Laterality: N/A;   Family History  Problem Relation Age of Onset  . Cancer Sister    Social History:  reports that she has been smoking. She has a 20.00 pack-year smoking history. She does not have any smokeless tobacco history on file. She reports current alcohol use. She reports that she does not use drugs. Allergies:  Allergies  Allergen Reactions  . Celebrex [Celecoxib] Swelling    Throat swelling  . Sulfa Antibiotics Swelling    Throat swelling   Medications Prior to Admission  Medication Sig Dispense Refill  . ALPRAZolam (XANAX) 1 MG tablet Take 0.5 tablets (0.5 mg total) by mouth 2 (two) times daily. 30 tablet 0  . finasteride (PROSCAR) 5 MG tablet Take 5 mg by mouth daily.    Marland Kitchen losartan (COZAAR) 100 MG tablet Take 100 mg by mouth daily.    . ondansetron (ZOFRAN) 4 MG tablet Take 1 tablet (4 mg total) by mouth every 8 (eight) hours as needed for nausea or vomiting. 20 tablet 0  . oxyCODONE-acetaminophen (PERCOCET) 10-325 MG tablet Take 1 tablet by mouth every 4 (four) hours as needed for pain. 60 tablet 0  . pantoprazole (PROTONIX) 40 MG tablet Take 1 tablet (40 mg total) by mouth 2 (two) times daily. 60 tablet 11  . pravastatin (PRAVACHOL) 40 MG tablet Take 40 mg by mouth daily.  5  . traZODone (DESYREL) 100 MG tablet Take 100 mg by mouth at bedtime as needed for sleep.    Marland Kitchen gabapentin (NEURONTIN) 600 MG tablet Take 1 tablet (600 mg total) by mouth 3 (three) times daily. (Patient not taking: Reported on 02/12/2020) 30 tablet 0    Drug Regimen Review Drug regimen was reviewed and remains appropriate  with no significant issues identified  Home: Home Living Family/patient expects to be discharged to:: Private residence Living Arrangements: Spouse/significant other Available Help at Discharge: Family, Available PRN/intermittently Type of Home: House Home Access: Stairs to enter Secretary/administrator of Steps: 2 Home Layout: Two level, Able to live on main level with bedroom/bathroom Alternate Level Stairs-Number of Steps: flight Alternate Level Stairs-Rails: Left Bathroom Shower/Tub: Engineer, manufacturing systems: Standard Bathroom Accessibility: Yes Home Equipment: Cane - single point   Functional History: Prior Function Level of Independence: Independent with assistive device(s) Comments: limited by back pain  Functional Status:  Mobility: Bed Mobility Overal bed mobility: Needs Assistance Bed Mobility: Supine to Sit Rolling: Min assist Sidelying to sit: Min guard Supine to sit: HOB elevated Sit to supine: Max assist General bed mobility comments: Pt seated in bedside chair upon OT arrival Transfers Overall transfer level: Needs assistance Equipment used: Rolling walker (2 wheeled) Transfers: Sit to/from Stand Sit to Stand: Min guard Stand pivot transfers: Min assist, +2 physical assistance, +2 safety/equipment General transfer comment: To ensure balance and safety Ambulation/Gait Ambulation/Gait assistance: Min assist Gait Distance (Feet): 55 Feet(x2) Assistive  device: Rolling walker (2 wheeled) Gait Pattern/deviations: Step-through pattern General Gait Details: continues with mild unsteadiness, drifting and waggle.  Noticeably fatiguing  HR rising in to the low 130's  sats 94%.  All on 3L [place] Gait velocity: decr Gait velocity interpretation: <1.8 ft/sec, indicate of risk for recurrent falls    ADL: ADL Overall ADL's : Needs assistance/impaired Eating/Feeding: NPO Grooming: Wash/dry hands, Wash/dry face, Oral care, Brushing hair, Set up,  Supervision/safety, Sitting Upper Body Bathing: Set up, Supervision/ safety, Sitting Lower Body Bathing: Supervison/ safety, Min guard, Sit to/from stand Lower Body Bathing Details (indicate cue type and reason): Supervision while seated, min guard while standing to complete Upper Body Dressing : Moderate assistance, Sitting Lower Body Dressing: Set up, Supervision/safety, Sitting/lateral leans Lower Body Dressing Details (indicate cue type and reason): While seated in bedside chair. Figure four position.  Toilet Transfer: +2 for safety/equipment, Stand-pivot, BSC, +2 for physical assistance, Moderate assistance Toilet Transfer Details (indicate cue type and reason): max A to boost up hips and remain steady Toileting- Clothing Manipulation and Hygiene: +2 for physical assistance, +2 for safety/equipment, Maximal assistance Toileting - Clothing Manipulation Details (indicate cue type and reason): bed level peri care after BM in bed Functional mobility during ADLs: Min guard, Rolling walker General ADL Comments: Pt tolerated standing 1 x 1 min with min guard to complete peri care. Pt engaged in seated self-care tasks.   Cognition: Cognition Overall Cognitive Status: Within Functional Limits for tasks assessed Orientation Level: Oriented X4 Cognition Arousal/Alertness: Awake/alert Behavior During Therapy: WFL for tasks assessed/performed Overall Cognitive Status: Within Functional Limits for tasks assessed Area of Impairment: Memory, Safety/judgement, Problem solving, Following commands, Attention Current Attention Level: Sustained Memory: Decreased recall of precautions, Decreased short-term memory Following Commands: Follows one step commands with increased time, Follows one step commands inconsistently Safety/Judgement: Decreased awareness of safety, Decreased awareness of deficits Awareness: Intellectual Problem Solving: Requires verbal cues, Requires tactile cues, Slow processing,  Decreased initiation General Comments: A&O x 4. Pt able to follow one step commands without difficulty.  Physical Exam: Blood pressure 129/72, pulse (!) 104, temperature 98.1 F (36.7 C), temperature source Oral, resp. rate 20, height 5\' 2"  (1.575 m), weight 53.7 kg, SpO2 98 %. Physical Exam  Constitutional: She is oriented to person, place, and time.  Small build, frail appearing  HENT:  Head: Normocephalic and atraumatic.  Eyes: Pupils are equal, round, and reactive to light. Conjunctivae and EOM are normal.  Neck: No JVD present. No tracheal deviation present. No thyromegaly present.  Cardiovascular: Normal rate and regular rhythm.  Respiratory: She has no wheezes. She has no rales.  Pt taking shallow, rapid breaths. Denies being sob ("i'm nervous"), diminished breath sounds at the bases  GI: Soft. She exhibits no distension. There is no abdominal tenderness. There is no rebound.  Musculoskeletal:        General: No edema. Normal range of motion.     Cervical back: Normal range of motion.  Neurological: She is alert and oriented to person, place, and time. She has normal reflexes. No cranial nerve deficit. Coordination normal.  Motor upper ext 4/5 prox to distal. LE: 3+ to 4-/5 prox to 4+ distally with ADF/PF. No sensory dysfunction.  Skin: Skin is warm. No rash noted. No erythema.  Psychiatric:  Pt pleasant but quite anxious    Results for orders placed or performed during the hospital encounter of 02/11/20 (from the past 48 hour(s))  Glucose, capillary     Status: None  Collection Time: 02/29/20  7:20 AM  Result Value Ref Range   Glucose-Capillary 97 70 - 99 mg/dL    Comment: Glucose reference range applies only to samples taken after fasting for at least 8 hours.  Comprehensive metabolic panel     Status: Abnormal   Collection Time: 03/01/20  2:59 AM  Result Value Ref Range   Sodium 137 135 - 145 mmol/L   Potassium 5.2 (H) 3.5 - 5.1 mmol/L   Chloride 99 98 - 111 mmol/L    CO2 29 22 - 32 mmol/L   Glucose, Bld 106 (H) 70 - 99 mg/dL    Comment: Glucose reference range applies only to samples taken after fasting for at least 8 hours.   BUN 14 8 - 23 mg/dL   Creatinine, Ser 3.50 0.44 - 1.00 mg/dL   Calcium 8.7 (L) 8.9 - 10.3 mg/dL   Total Protein 5.9 (L) 6.5 - 8.1 g/dL   Albumin 2.5 (L) 3.5 - 5.0 g/dL   AST 32 15 - 41 U/L   ALT 31 0 - 44 U/L   Alkaline Phosphatase 65 38 - 126 U/L   Total Bilirubin 0.8 0.3 - 1.2 mg/dL   GFR calc non Af Amer >60 >60 mL/min   GFR calc Af Amer >60 >60 mL/min   Anion gap 9 5 - 15    Comment: Performed at [facility], [address], [place], [place] [address]  Magnesium     Status: None   Collection Time: 03/01/20  2:59 AM  Result Value Ref Range   Magnesium 1.7 1.7 - 2.4 mg/dL    Comment: Performed at [facility] [place] [facility], [address] [place], [place] [address]  Phosphorus     Status: None   Collection Time: 03/01/20  2:59 AM  Result Value Ref Range   Phosphorus 3.8 2.5 - 4.6 mg/dL    Comment: Performed at [facility], [address] [place], [place] [address]  CBC with Differential/Platelet     Status: Abnormal   Collection Time: 03/01/20  2:59 AM  Result Value Ref Range   WBC 8.0 4.0 - 10.5 K/uL   RBC 2.12 (L) 3.87 - 5.11 MIL/uL   Hemoglobin 8.2 (L) 12.0 - 15.0 g/dL   HCT 78.9 (L) 38.1 - 01.7 %   MCV 117.5 (H) 80.0 - 100.0 fL   MCH 38.7 (H) 26.0 - 34.0 pg   MCHC 32.9 30.0 - 36.0 g/dL   RDW 51.0 25.8 - 52.7 %   Platelets 304 150 - 400 K/uL   nRBC 0.0 0.0 - 0.2 %   Neutrophils Relative % 74 %   Neutro Abs 5.8 1.7 - 7.7 K/uL   Lymphocytes Relative 17 %   Lymphs Abs 1.3 0.7 - 4.0 K/uL   Monocytes Relative 7 %   Monocytes Absolute 0.6 0.1 - 1.0 K/uL   Eosinophils Relative 2 %   Eosinophils Absolute 0.2 0.0 - 0.5 K/uL   Basophils Relative 0 %   Basophils Absolute 0.0 0.0 - 0.1 K/uL   Immature Granulocytes 0 %   Abs Immature Granulocytes 0.03 0.00 - 0.07 K/uL    Comment: Performed at [facility], [address] [place], [place] [address]  Comprehensive metabolic panel     Status: Abnormal   Collection Time: 03/02/20  3:13 AM  Result Value Ref Range   Sodium 138 135 - 145 mmol/L   Potassium 3.7 3.5 - 5.1 mmol/L   Chloride 99  98 - 111 mmol/L   CO2 30 22 - 32 mmol/L   Glucose, Bld 98 70 - 99 mg/dL    Comment: Glucose reference range applies only to samples taken after fasting for at least 8 hours.   BUN 12 8 - 23 mg/dL   Creatinine, Ser 0.42 (L) 0.44 - 1.00 mg/dL   Calcium 8.5 (L) 8.9 - 10.3 mg/dL   Total Protein 5.9 (L) 6.5 - 8.1 g/dL   Albumin 2.5 (L) 3.5 - 5.0 g/dL   AST 29 15 - 41 U/L   ALT 32 0 - 44 U/L   Alkaline Phosphatase 65 38 - 126 U/L   Total Bilirubin 0.3 0.3 - 1.2 mg/dL   GFR calc non Af Amer >60 >60 mL/min   GFR calc Af Amer >60 >60 mL/min   Anion gap 9 5 - 15    Comment: Performed at [facility] [address], [place], [place] [address]  Magnesium     Status: None   Collection Time: 03/02/20  3:13 AM  Result Value Ref Range   Magnesium 1.9 1.7 - 2.4 mg/dL    Comment: Performed at [facility] [address] [place], [place] [address]  Phosphorus     Status: None   Collection Time: 03/02/20  3:13 AM  Result Value Ref Range   Phosphorus 3.9 2.5 - 4.6 mg/dL    Comment: Performed at [facility] [address] [place], [place] [address]  CBC with Differential/Platelet     Status: Abnormal   Collection Time: 03/02/20  3:13 AM  Result Value Ref Range   WBC 8.9 4.0 - 10.5 K/uL   RBC 2.14 (L) 3.87 - 5.11 MIL/uL   Hemoglobin 8.1 (L) 12.0 - 15.0 g/dL   HCT 25.2 (L) 36.0 - 46.0 %   MCV 117.8 (H) 80.0 - 100.0 fL   MCH 37.9 (H) 26.0 - 34.0 pg   MCHC 32.1 30.0 - 36.0 g/dL   RDW 13.5 11.5 - 15.5 %   Platelets 323 150 - 400 K/uL   nRBC 0.0 0.0 - 0.2 %   Neutrophils Relative % 77 %   Neutro Abs 6.8 1.7 - 7.7 K/uL   Lymphocytes Relative 12 %   Lymphs Abs 1.1 0.7 - 4.0 K/uL   Monocytes Relative 8 %   Monocytes Absolute 0.7 0.1 -  1.0 K/uL   Eosinophils Relative 3 %   Eosinophils Absolute 0.3 0.0 - 0.5 K/uL   Basophils Relative 0 %   Basophils Absolute 0.0 0.0 - 0.1 K/uL   Immature Granulocytes 0 %   Abs Immature Granulocytes 0.03 0.00 - 0.07 K/uL    Comment: Performed at [facility], [address] [place], [place] [address]   No results found.     Medical Problem List and Plan: 1.  Decreased functional mobility with altered mental status secondary to acute respiratory failure with hypoxia extubated 02/13/2020/pulmonary emboli/acute metabolic encephalopathy  -patient may shower  -ELOS/Goals: 10-12 days, supervision to mod I goals 2.  Antithrombotics: -DVT/anticoagulation: Eliquis for pulmonary emboli  -antiplatelet therapy: n/a  3. Pain Management/chronic low back pain: pt uses her oxycodone 4x daily at home on a schedule. Will schedule her doses at 0700 and 1200 daily, and leave other doses up to her in the PM.   -has spinal cord stimulator per Dr. Rolena Infante, Dr. Nelva Bush manages her chronic pain 4. Mood/panic disorder: Xanax 0.5 mg twice daily as needed  -pt was anxious  about coming to rehab, people she might work with. Reassured her that we will take care and that she will do well.   -antipsychotic agents: N/A 5. Neuropsych: This patient is capable of making decisions on her own behalf. 6. Skin/Wound Care: Routine skin checks 7. Fluids/Electrolytes/Nutrition: Routine in and outs with follow-up chemistries 9.  AKI.  Resolved.  Follow-up chemistries on Monday 10.  Acute on chronic diastolic congestive heart failure.  Lasix 20 mg daily.  Monitor for any signs of fluid overload. Check daily weights 11.  Pancytopenia.  Follow-up labs on Monday 12.  Hypertension.  Hydralazine 25 mg every 8 hours.  Monitor with increased mobility 13.  Tobacco and alcohol abuse.  Continue NicoDerm patch.  Provide counseling 14.  Urinary retention.  Urecholine 5 mg 3 times daily.  Check PVR  -oob to void 15.  GERD.  Protonix      Mcarthur Rossetti Angiulli, PA-C 03/02/2020

## 2020-03-02 NOTE — Discharge Summary (Signed)
Physician Discharge Summary  RUBA OUTEN ZOX:[id number] DOB: 17-Jun-1951 DOA: 02/11/2020  PCP: Lonie Peak, PA-C  Admit date: 02/11/2020 Discharge date: 03/02/2020   Time spent: 30 minutes  Recommendations for Outpatient Follow-up:   Acute respiratory failure with hypoxia/HCAPpositive Serratia Marcescens/Candida Albicans -Multifactorial probably from aspiration pneumonia,right lower lobe segmental pulmonary embolism, COPD exacerbation, pulmonary edema, benzo withdrawal and Mild acute on chronic diastolic heart failure -3/27 intubated---> 3/29 Extubated   -Completed 10 days IV antibiotics for HCAP  RIGHT lower lobe PE -Apixaban 5 mg BID -Lower extremity Dopplers are negative.  -Patient continues to have episodes of tachypnea and S OB. -Titrate O2 to maintain SPO2> 88%  Acute Metabolic Encephalopathy -Multifactorial benzodiazepine withdrawal, metabolic derangement, sundowning, hospital delirium. -Resolved.  AKI/Urinary Retention -S/p bladder outlet obstruction.   -Foley catheter placed -Urecholine 5 mg TID.  Acute on Chronic Diastolic CHF -Strict in and out -21.8 L -Daily weight Filed Weights   02/27/20 0459 02/28/20 0335 02/29/20 0346  Weight: 55.8 kg 54.4 kg 53.7 kg   -Lasix IV 20 mg -HydralazinePRN -Hydralazine 25 mg TID  Essential HTN -See CHF  Hyperkalemia -Lokelma 10 g x 1 dose  Hypomagnesmia -Magnesium goal> 2 -Magnesium IV 2 g  Anemia of chronic disease/iron deficiency anemia -Hemoglobin around 8. -Transfuse for hemoglobin <7  Recent Labs  Lab 02/25/20 1237 02/26/20 1018 02/29/20 0305 03/01/20 0259 03/02/20 0313  HGB 7.9* 8.1* 8.1* 8.2* 8.1*   Hx chronic dysmotility/chronic emesis/chronic GERD -3/22 EGD/Colonoscopy; colitis and reactive gastropathy. -Protonix 40 mg daily BID -Continue current bowel regimen -Follow-up GI in 6 weeks   Severe protein calorie malnutrition -Regular diet.  Sacral Decubitus ulcer stage II Pressure  Injury 03/01/20 Sacrum Lower Stage 2 -  Partial thickness loss of dermis presenting as a shallow open injury with a red, pink wound bed without slough. quarter sized spot; top layer of skin missing and red (Active)  03/01/20 1010  Location: Sacrum  Location Orientation: Lower  Staging: Stage 2 -  Partial thickness loss of dermis presenting as a shallow open injury with a red, pink wound bed without slough.  Wound Description (Comments): quarter sized spot; top layer of skin missing and red  Present on Admission: No        Discharge Diagnoses:  Active Problems:   Essential hypertension   Pancytopenia (HCC)   Sepsis (HCC)   Aspiration pneumonia (HCC)   Acute on chronic respiratory failure with hypoxia (HCC)   Hyponatremia   AKI (acute kidney injury) (HCC)   Dehydration   Rhabdomyolysis   Elevated troponin   Respiratory failure (HCC)   Multiple subsegmental pulmonary emboli without acute cor pulmonale (HCC)   Delirium   Sacral decubitus ulcer, stage II (HCC)   Discharge Condition: Stable  Diet recommendation: Regular  Filed Weights   02/27/20 0459 02/28/20 0335 02/29/20 0346  Weight: 55.8 kg 54.4 kg 53.7 kg    History of present illness:  69 year old WF PMHx COPD, HTN, chronic back pain s/p elective stimulator implanted~2016, chronic Gastric Dysmotility, Chronic Emesis developed a RIGHT sided aspiration pneumonia intubated on 02/11/2020 and extubated on 02/13/2020. She was initially transferred out of the ICU on 02/14/2020 but was back in the ICU on 02/16/2020 for worsening agitation. CT of the head on 3/431 does not show any acute intracranial abnormality. Shewas also found to have RIGHT lower lobe segmental PE and waas started on Eliquis.Subsequent echocardiogram does not show any right heart strain. She had prolonged ICU stay due to acute metabolic encephalopathy. Currently she is  alert and oriented and has been transferred out of ICU to [facility] service on 02/24/2020. Hospital  course complicated by mild acute on chronic diastolic heart failure and she was started on IV Lasix with good diuresis. Therapy evaluations recommending CIR. CIR admission pending insurance authorization and bed availability Urinary retention requiring 2 times of in and out cath, foley catheter was placedforaggressive diuresis and urinary retention  Hospital Course:  During this hospitalization patient treated for multiple medical conditions see above.  Patient now extremely debilitated requiring CIR  Procedures: 4/12 PCXR;-persistent airspace opacity right upper lobe with slightly greater consolidation near the minor fissure on the right compared to 1 day prior.  -Persistent small left pleural effusion with left lower lobe atelectasis. Stable small right pleural effusion.   Cultures  3/28 blood LEFT forearm negative final 3/28 blood RIGHT forearm negative final 3/28 MRSA by PCR negative 4/5 tracheal aspirate positive Serratia Marcescens/Candida Albicans   Antibiotics Anti-infectives (From admission, onward)   Start     Dose/Rate Stop   02/21/20 1130  vancomycin (VANCOCIN) 50 mg/mL oral solution 125 mg  Status:  Discontinued     125 mg 02/22/20 0810   02/20/20 1100  ceFEPIme (MAXIPIME) 2 g in sodium chloride 0.9 % 100 mL IVPB  Status:  Discontinued     2 g 200 mL/hr over 30 Minutes 02/23/20 0823   02/15/20 1230  metroNIDAZOLE (FLAGYL) IVPB 500 mg  Status:  Discontinued     500 mg 100 mL/hr over 60 Minutes 02/17/20 1123   02/15/20 1230  ceFEPIme (MAXIPIME) 2 g in sodium chloride 0.9 % 100 mL IVPB  Status:  Discontinued     2 g 200 mL/hr over 30 Minutes 02/18/20 1029   02/12/20 2030  Ampicillin-Sulbactam (UNASYN) 3 g in sodium chloride 0.9 % 100 mL IVPB  Status:  Discontinued     3 g 200 mL/hr over 30 Minutes 02/15/20 1153   02/12/20 2000  cefTRIAXone (ROCEPHIN) 1 g in sodium chloride 0.9 % 100 mL IVPB  Status:  Discontinued     1 g 200 mL/hr over 30 Minutes 02/12/20 0451    02/12/20 1000  ceFEPIme (MAXIPIME) 2 g in sodium chloride 0.9 % 100 mL IVPB  Status:  Discontinued     2 g 200 mL/hr over 30 Minutes 02/12/20 1344   02/12/20 0700  metroNIDAZOLE (FLAGYL) IVPB 500 mg  Status:  Discontinued     500 mg 100 mL/hr over 60 Minutes 02/12/20 0441   02/12/20 0545  vancomycin (VANCOREADY) IVPB 750 mg/150 mL  Status:  Discontinued     750 mg 150 mL/hr over 60 Minutes 02/12/20 1344   02/11/20 2230  cefTRIAXone (ROCEPHIN) 2 g in sodium chloride 0.9 % 100 mL IVPB  Status:  Discontinued     2 g 200 mL/hr over 30 Minutes 02/12/20 0441   02/11/20 2230  azithromycin (ZITHROMAX) 500 mg in sodium chloride 0.9 % 250 mL IVPB  Status:  Discontinued     500 mg 250 mL/hr over 60 Minutes 02/12/20 0441   02/11/20 2230  metroNIDAZOLE (FLAGYL) IVPB 500 mg     500 mg 100 mL/hr over 60 Minutes 02/12/20 0204       Discharge Exam: Vitals:   03/01/20 2057 03/02/20 0555 03/02/20 0800 03/02/20 1128  BP: 132/65 129/72 (!) 150/80   Pulse: (!) 102 (!) 104 100 (!) 127  Resp: 19 20 19    Temp: 98.1 F (36.7 C)  98.3 F (36.8 C)   TempSrc: Oral  SpO2: 100% 98% 96% 94%  Weight:      Height:        General: A/O x4 no acute respiratory distress, cachectic Eyes: negative scleral hemorrhage, negative anisocoria, negative icterus ENT: Negative Runny nose, negative gingival bleeding, Neck:  Negative scars, masses, torticollis, lymphadenopathy, JVD Lungs: Tachypneic, clear to auscultation bilaterally without wheezes or crackles Cardiovascular: Regular rate and rhythm without murmur gallop or rub normal S1 and S2   Discharge Instructions   Allergies as of 03/02/2020      Reactions   Celebrex [celecoxib] Swelling   Throat swelling   Sulfa Antibiotics Swelling   Throat swelling      Medication List    STOP taking these medications   finasteride 5 MG tablet Commonly known as: PROSCAR   gabapentin 600 MG tablet Commonly known as: NEURONTIN   losartan 100 MG  tablet Commonly known as: COZAAR   pravastatin 40 MG tablet Commonly known as: PRAVACHOL   traZODone 100 MG tablet Commonly known as: DESYREL     TAKE these medications   acetaminophen 325 MG tablet Commonly known as: TYLENOL Take 2 tablets (650 mg total) by mouth every 6 (six) hours as needed for mild pain (or Fever >/= 101).   ALPRAZolam 0.5 MG tablet Commonly known as: XANAX Take 1 tablet (0.5 mg total) by mouth 2 (two) times daily as needed for anxiety. What changed:   medication strength  when to take this  reasons to take this   apixaban 5 MG Tabs tablet Commonly known as: ELIQUIS Take 1 tablet (5 mg total) by mouth 2 (two) times daily.   bethanechol 5 MG tablet Commonly known as: URECHOLINE Take 1 tablet (5 mg total) by mouth 3 (three) times daily.   bisacodyl 10 MG suppository Commonly known as: DULCOLAX Place 1 suppository (10 mg total) rectally daily as needed for moderate constipation.   folic acid 1 MG tablet Commonly known as: FOLVITE Place 1 tablet (1 mg total) into feeding tube daily. Start taking on: March 03, 2020   furosemide 10 MG/ML injection Commonly known as: LASIX Inject 2 mLs (20 mg total) into the vein daily. Start taking on: March 03, 2020   Gerhardt's butt cream Crea Apply 1 application topically 3 (three) times daily.   hydrALAZINE 20 MG/ML injection Commonly known as: APRESOLINE Inject 0.5 mLs (10 mg total) into the vein every 6 (six) hours as needed (SBP > 170 or DBP > 100.).   hydrALAZINE 25 MG tablet Commonly known as: APRESOLINE Take 1 tablet (25 mg total) by mouth every 8 (eight) hours.   levalbuterol 0.63 MG/3ML nebulizer solution Commonly known as: XOPENEX Take 3 mLs (0.63 mg total) by nebulization every 2 (two) hours as needed for wheezing or shortness of breath.   mouth rinse Liqd solution 15 mLs by Mouth Rinse route 2 (two) times daily.   multivitamin with minerals Tabs tablet Take 1 tablet by mouth  daily. Start taking on: March 03, 2020   nicotine 21 mg/24hr patch Commonly known as: NICODERM CQ - dosed in mg/24 hours Place 1 patch (21 mg total) onto the skin daily. Start taking on: March 03, 2020   ondansetron 4 MG tablet Commonly known as: ZOFRAN Take 1 tablet (4 mg total) by mouth every 6 (six) hours as needed for nausea. What changed:   when to take this  reasons to take this   oxyCODONE-acetaminophen 10-325 MG tablet Commonly known as: Percocet Take 1 tablet by mouth every 4 (four) hours  as needed for pain.   pantoprazole 40 MG tablet Commonly known as: PROTONIX Take 1 tablet (40 mg total) by mouth 2 (two) times daily.   polyethylene glycol 17 g packet Commonly known as: MIRALAX / GLYCOLAX Take 17 g by mouth daily. Start taking on: March 03, 2020   thiamine 100 MG tablet Take 1 tablet (100 mg total) by mouth daily. Start taking on: March 03, 2020   zolpidem 5 MG tablet Commonly known as: AMBIEN Take 1 tablet (5 mg total) by mouth at bedtime as needed for sleep.      Allergies  Allergen Reactions  . Celebrex [Celecoxib] Swelling    Throat swelling  . Sulfa Antibiotics Swelling    Throat swelling   Follow-up Information    Parrett, Virgel Bouquet, NP. Go on 03/21/2020.   Specialty: Pulmonary Disease Why: Appointment is scheduled at 330pm.  Please call our office if you need to reschedule Contact information: [address] [place] [address] [phone]            The results of significant diagnostics from this hospitalization (including imaging, microbiology, ancillary and laboratory) are listed below for reference.    Significant Diagnostic Studies: CT ABDOMEN PELVIS WO CONTRAST  Result Date: 02/21/2020 CLINICAL DATA:  Abdominal pain and distention. EXAM: CT ABDOMEN AND PELVIS WITHOUT CONTRAST TECHNIQUE: Multidetector CT imaging of the abdomen and pelvis was performed following the standard protocol without IV contrast. COMPARISON:   October 06, 2018. FINDINGS: Lower chest: Moderate bilateral pleural effusions are noted with adjacent subsegmental atelectasis or infiltrates. Hepatobiliary: No focal liver abnormality is seen. No gallstones, gallbladder wall thickening, or biliary dilatation. Pancreas: Unremarkable. No pancreatic ductal dilatation or surrounding inflammatory changes. Spleen: Normal in size without focal abnormality. Adrenals/Urinary Tract: Adrenal glands appear normal. Mild bilateral hydroureteronephrosis is noted. Severe urinary bladder distention is noted concerning for bladder outlet obstruction. No renal or ureteral calculi are noted. Stomach/Bowel: Feeding tube is seen passing through the stomach and into proximal duodenum. No evidence of bowel obstruction or inflammation is noted. The appendix is not visualized. Vascular/Lymphatic: Aortic atherosclerosis. No enlarged abdominal or pelvic lymph nodes. Reproductive: Uterus and bilateral adnexa are unremarkable. Other: Mild ascites is noted. Mild anasarca is noted. No hernia is noted. Musculoskeletal: No acute or significant osseous findings. IMPRESSION: 1. Moderate bilateral pleural effusions are noted with adjacent subsegmental atelectasis or infiltrates. 2. Mild ascites is noted. 3. Mild anasarca is noted. 4. Severe urinary bladder distention is noted concerning for bladder outlet obstruction. Mild bilateral hydroureteronephrosis is noted. No renal or ureteral calculi are noted. Aortic Atherosclerosis (ICD10-I70.0). Electronically Signed   By: Lupita Raider M.D.   On: 02/21/2020 19:37   CT HEAD WO CONTRAST  Result Date: 02/15/2020 CLINICAL DATA:  Encephalopathy. Additional provided: Patient in ED confused and hypoxic. EXAM: CT HEAD WITHOUT CONTRAST TECHNIQUE: Contiguous axial images were obtained from the base of the skull through the vertex without intravenous contrast. COMPARISON:  Head CT 02/11/2019 FINDINGS: Brain: There is no evidence of acute intracranial  hemorrhage, intracranial mass, midline shift or extra-axial fluid collection.No demarcated cortical infarction. Redemonstrated chronic small-vessel infarct within the left corona radiata/basal ganglia. Additional ill-defined hypoattenuation within the cerebral white matter is nonspecific, but consistent with chronic small vessel ischemic disease. Findings are similar to prior examination. Stable, mild generalized parenchymal atrophy. Vascular: No hyperdense vessel.  Atherosclerotic calcifications. Skull: Normal. Negative for fracture or focal lesion. Sinuses/Orbits: Visualized orbits demonstrate no acute abnormality. Chronic deformity of the bilateral maxillary  sinuses. Previous surgical fixation of the anterior maxillary sinuses. No significant paranasal sinus disease or mastoid effusion at the imaged levels. Chronic bilateral nasal bone fractures IMPRESSION: No evidence of acute intracranial abnormality. Stable chronic small vessel ischemic disease. 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 Stable, mild generalized parenchymal atrophy. Electronically Signed   By: Jackey Loge DO   On: 02/15/2020 16:11   CT Head Wo Contrast  Result Date: 02/11/2020 CLINICAL DATA:  Ataxia, witnessed syncope EXAM: CT HEAD WITHOUT CONTRAST TECHNIQUE: Contiguous axial images were obtained from the base of the skull through the vertex without intravenous contrast. COMPARISON:  12/23/2016 FINDINGS: Brain: Focal hypodensity within the left basal ganglia consistent with chronic infarct. Confluent hypodensities throughout the periventricular white matter are consistent with age-indeterminate small vessel ischemic changes, favor chronic. No other signs of acute infarct or hemorrhage. Lateral ventricles and midline structures are otherwise unremarkable. No acute extra-axial fluid collections. No mass effect. Vascular: No hyperdense vessel or unexpected calcification. Skull: Normal. Negative for  fracture or focal lesion. Sinuses/Orbits: No acute displaced fracture. Other: None IMPRESSION: 1. Small-vessel ischemic changes throughout the periventricular white matter and left basal ganglia, likely chronic. 2. No acute hemorrhage. Electronically Signed   By: Sharlet Salina M.D.   On: 02/11/2020 22:35   CT ANGIO CHEST PE W OR WO CONTRAST  Result Date: 02/15/2020 CLINICAL DATA:  Shortness of breath. Chest x-ray showed diffuse RIGHT-sided interstitial opacities and dense consolidation in the RIGHT LOWER lobe concerning for aspiration pneumonia. EXAM: CT ANGIOGRAPHY CHEST WITH CONTRAST TECHNIQUE: Multidetector CT imaging of the chest was performed using the standard protocol during bolus administration of intravenous contrast. Multiplanar CT image reconstructions and MIPs were obtained to evaluate the vascular anatomy. CONTRAST:  OMNIPAQUE IOHEXOL 350 MG/ML SOLN COMPARISON:  CT anterior chest on 01/11/2020, chest x-ray 02/15/2020 and earlier FINDINGS: Cardiovascular: The heart is enlarged. There are coronary artery calcifications. No pericardial effusion. There is significant atherosclerotic calcification of the thoracic aorta. Ascending aorta is 4.1 centimeters and stable in appearance. Pulmonary arteries are well opacified by contrast bolus. Focal embolus identified within a segmental branch of the RIGHT LOWER lobe pulmonary artery on image 175 of series 5 and image 82 of series 9. No evidence for RIGHT heart strain. Mediastinum/Nodes: Esophagus is unremarkable. The visualized portion of the thyroid gland has a normal appearance. Confluent mediastinal lymph nodes are likely reactive in have increased since previous exam. Lungs/Pleura: Interval development of bilateral pleural effusions, LEFT greater than RIGHT. There are confluent airspace filling opacities involving the RIGHT UPPER and LOWER lobes with a dependent gradient. The RIGHT middle lobe is relatively spared. Similar changes are identified  within the anterior aspect of the LEFT UPPER lobe. There is minimal LEFT LOWER lobe atelectasis. The affected lung parenchyma shows interval development of small cystic changes. There is no pneumothorax. Upper Abdomen: Partially imaged spinal stimulator. UPPER abdomen is otherwise unremarkable. Musculoskeletal: Mild degenerative changes in the midthoracic spine. No suspicious lytic or blastic lesions are identified. Review of the MIP images confirms the above findings. IMPRESSION: 1. Technically adequate exam showing acute pulmonary embolus within segmental branch of the RIGHT LOWER lobe pulmonary artery. 2. Interval development of bilateral pleural effusions, LEFT greater than RIGHT. 3. Dense airspace filling opacities in the RIGHT UPPER and LOWER lobes and to lesser degree in the anterior aspect of the LEFT UPPER lobe. Appearance favors infectious process. 4. Interval development of small cystic changes within the affected lung parenchyma. 5. Cardiomegaly and coronary  artery disease. 6. Stable appearance of ascending thoracic aortic aneurysm, 4.1 centimeters. 7. Recommend annual imaging followup by CTA or MRA. This recommendation follows 2010 ACCF/AHA/AATS/ACR/ASA/SCA/SCAI/SIR/STS/SVM Guidelines for the Diagnosis and Management of Patients with Thoracic Aortic Disease. Circulation. 2010; 121: V761-Y073. 8. Aortic aneurysm NOS (ICD10-I71.9) Aortic Atherosclerosis (ICD10-I70.0). Critical Value/emergent results were called by telephone at the time of interpretation on 02/15/2020 at 4:16 pm to provider [facility] , who verbally acknowledged these results. Electronically Signed   By: Nolon Nations M.D.   On: 02/15/2020 16:29   DG CHEST PORT 1 VIEW  Result Date: 02/27/2020 CLINICAL DATA:  Shortness of breath EXAM: PORTABLE CHEST 1 VIEW COMPARISON:  February 26, 2020. FINDINGS: There is persistent airspace opacity in the right upper lobe with slightly more consolidation near the right minor fissure compared to  1 day prior. There is atelectasis in the left lower lobe with small left pleural effusion. Small right pleural effusion also noted. Heart is upper normal in size with pulmonary vascularity normal. Feeding tube tip is at the level of the pylorus. Thoracic stimulator leads are in the midthoracic region, stable. There is aortic atherosclerosis. No bone lesions. IMPRESSION: Persistent airspace opacity right upper lobe with slightly greater consolidation near the minor fissure on the right compared to 1 day prior. Persistent small left pleural effusion with left lower lobe atelectasis. Stable small right pleural effusion. Stable cardiac silhouette.  Aortic Atherosclerosis (ICD10-I70.0). Feeding tube tip at level of pylorus. Electronically Signed   By: Lowella Grip III M.D.   On: 02/27/2020 12:45   DG CHEST PORT 1 VIEW  Result Date: 02/26/2020 CLINICAL DATA:  Short of breath EXAM: PORTABLE CHEST 1 VIEW COMPARISON:  02/22/2020 FINDINGS: Feeding tube unchanged. Normal cardiac silhouette. Pleural thickening fluid along the RIGHT horizontal fissure unchanged. Increase in central venous congestion. LEFT basilar atelectasis and effusion. IMPRESSION: 1. Mild increase in central venous congestion. 2. Bilateral pleural effusions and basilar atelectasis. Electronically Signed   By: Suzy Bouchard M.D.   On: 02/26/2020 04:42   DG CHEST PORT 1 VIEW  Result Date: 02/22/2020 CLINICAL DATA:  Pneumonia. EXAM: PORTABLE CHEST 1 VIEW COMPARISON:  Single-view of the chest 02/20/2020 and 02/17/2020. FINDINGS: Feeding tube and spinal stimulator are again seen. Bilateral airspace disease and small effusions have markedly improved since the most recent exam. No pneumothorax. Heart size is normal. Atherosclerosis noted. IMPRESSION: Marked improvement in bilateral effusions and right worse than left airspace disease. Electronically Signed   By: Inge Rise M.D.   On: 02/22/2020 11:08   DG CHEST PORT 1 VIEW  Result Date:  02/20/2020 CLINICAL DATA:  History of acute hypoxemic respiratory failure due to aspiration pneumonia and pulmonary embolus. History of COPD. EXAM: PORTABLE CHEST 1 VIEW COMPARISON:  Single-view of the chest 02/17/2020 and 02/15/2020. CT chest 02/15/2020. FINDINGS: Left pleural effusion and basilar airspace disease have worsened since the most recent examination. Diffuse airspace disease in the right chest appears mildly improved in the right upper lobe. Right pleural effusion is unchanged. There is cardiomegaly. Lungs are emphysematous. Feeding tube and spinal cord stimulator are noted. IMPRESSION: Increased left pleural effusion and basilar airspace disease. Mild improvement in diffuse airspace disease throughout the right chest. Small right effusion is unchanged. Cardiomegaly. Emphysema. Electronically Signed   By: Inge Rise M.D.   On: 02/20/2020 10:09   DG CHEST PORT 1 VIEW  Result Date: 02/17/2020 CLINICAL DATA:  Shortness of breath and confusion EXAM: PORTABLE CHEST 1 VIEW COMPARISON:  February 12, 2020 and  February 15, 2020 FINDINGS: Extensive airspace consolidation is noted in the right upper lobe, particularly along the more inferior aspect of the right upper lobe. Ill-defined airspace opacity to noted in the right base with small left pleural effusion. Left lung is clear. Heart is borderline enlarged with pulmonary vascularity within normal limits. There is aortic atherosclerosis. No adenopathy. Feeding tube tip is below the diaphragm. Thoracic stimulator leads are in the lower thoracic region. No bone lesions. IMPRESSION: Extensive airspace opacity right upper lobe with consolidation greatest more inferiorly in the right upper lobe. Somewhat more ill-defined airspace opacity in the right base noted with small right pleural effusion. Left lung clear. No new opacity evident. Stable cardiac silhouette. Aortic Atherosclerosis (ICD10-I70.0). Feeding tube tip below diaphragm. Electronically Signed   By:  Bretta Bang III M.D.   On: 02/17/2020 11:49   DG Chest Port 1 View  Result Date: 02/15/2020 CLINICAL DATA:  Acute respiratory failure with hypoxia, shortness of breath EXAM: PORTABLE CHEST 1 VIEW COMPARISON:  Portable exam 0554 hours compared to 02/12/2020 FINDINGS: Intraspinal stimulator identified at caudal thoracic spine. Interval removal of endotracheal, nasogastric, and LEFT thoracostomy tubes. Enlargement of cardiac silhouette. Atherosclerotic calcification aorta. Prominence of LEFT hilum unchanged. BILATERAL pulmonary infiltrates greatest in RIGHT upper lobe. Small BILATERAL pleural effusions. No pneumothorax or acute osseous findings. IMPRESSION: BILATERAL pulmonary infiltrates greatest in RIGHT upper lobe consistent with multifocal pneumonia. Small bibasilar pleural effusions. Electronically Signed   By: Ulyses Southward M.D.   On: 02/15/2020 08:53   DG Chest Portable 1 View  Result Date: 02/12/2020 CLINICAL DATA:  Post intubation EXAM: PORTABLE CHEST 1 VIEW COMPARISON:  Radiograph same day 1:51 a.m. FINDINGS: The heart size and mediastinal contours are unchanged. Aortic knob calcifications. Interval placement of ET tube 2 cm above the carina. NG tube is seen curled upward in the proximal stomach. Multifocal patchy airspace consolidation seen within the right mid and lower lung with diffusely coarsened interstitial opacities in the right upper lung. The left lung is clear. No pneumothorax. No acute osseous abnormality. IMPRESSION: ETT 2 cm above the carina. NG tube within the proximal stomach Unchanged multifocal airspace opacities throughout the right lung. Electronically Signed   By: Jonna Clark M.D.   On: 02/12/2020 03:37   DG CHEST PORT 1 VIEW  Result Date: 02/12/2020 CLINICAL DATA:  Initial evaluation for acute on chronic respiratory failure. EXAM: PORTABLE CHEST 1 VIEW COMPARISON:  Prior radiograph from 02/11/2020. FINDINGS: Cardiomegaly, stable. Mediastinal silhouette within normal  limits. Aortic atherosclerosis. Lungs normally inflated. Extensive parenchymal and interstitial opacities again seen throughout the right lung. More dense consolidative opacity within the right perihilar region and right lung base has progressed from previous. Findings concerning for progressive infection/pneumonia. Associated trace right pleural effusion. Left lung remains clear. No pneumothorax. No acute osseous finding. IMPRESSION: 1. Interval progression of extensive parenchymal and interstitial opacities throughout the right lung, with more dense consolidative opacity within the right perihilar region and right lung base. Findings concerning for progressive infection/pneumonia. 2. Associated small right pleural effusion. Electronically Signed   By: Rise Mu M.D.   On: 02/12/2020 02:11   DG Chest Port 1 View  Result Date: 02/11/2020 CLINICAL DATA:  Syncope, shortness of breath EXAM: PORTABLE CHEST 1 VIEW COMPARISON:  01/11/2020 FINDINGS: Single frontal view of the chest demonstrates stable cardiac silhouette. Ectasia of the thoracic aorta unchanged. There is diffuse interstitial and ground-glass airspace disease throughout the right chest, with denser consolidation in the right infrahilar region. Trace right effusion.  No pneumothorax. The left chest is clear. Spinal stimulator overlies the midthoracic spine. IMPRESSION: 1. Diffuse right-sided interstitial opacities and ground-glass airspace disease, with dense consolidation of the right lower lobe. Given history of syncope, aspiration could be considered. Asymmetric edema or diffuse right-sided pneumonia could also give this pattern. Electronically Signed   By: Sharlet Salina M.D.   On: 02/11/2020 21:51   DG Abd 2 Views  Result Date: 02/25/2020 CLINICAL DATA:  Abdominal distension EXAM: ABDOMEN - 2 VIEW COMPARISON:  02/12/2020 FINDINGS: Feeding tube coiled in the stomach. Spinal nerve stimulator projecting over the lower thoracic spine.  Cardiomediastinal contours remain mildly enlarged. Perifissural consolidation in the right chest is increased along with increased interstitial markings. Further consolidation at the left lung base with obscured left hemidiaphragm and left pleural effusion. Blunting of right costodiaphragmatic sulcus as well. No free air beneath either right or left hemidiaphragm. Thoracic skeletal structures are unremarkable. Small amount of gas in the stomach. Mildly distended small bowel loops in the abdomen without clear signs of obstruction. Gas in the colon. Small amount of gas likely in the rectum. No acute bone process. IMPRESSION: 1. Persistent areas of airspace disease in the chest, nonspecific likely with superimposed pulmonary edema. 2. Bilateral effusions as before. 3. Feeding tube coiled in the stomach. 4. Findings of may reflect mild ileus. Electronically Signed   By: Donzetta Kohut M.D.   On: 02/25/2020 18:29   DG Abd Portable 1V  Result Date: 02/12/2020 CLINICAL DATA:  Vomiting. EXAM: PORTABLE ABDOMEN - 1 VIEW COMPARISON:  Abdominal CT 11/05/2018 FINDINGS: Gaseous gastric distension. No evidence of small bowel dilatation. Scattered air throughout nondilated bowel in the left abdomen and pelvis. Small volume of colonic stool. No visualized radiopaque calculi. Spinal stimulator with battery pack projecting over the left gluteal regions. There multiple overlying monitoring devices. Patchy right infrahilar opacity. IMPRESSION: Gaseous gastric distension. No evidence of small bowel obstruction. Electronically Signed   By: Narda Rutherford M.D.   On: 02/12/2020 01:01   ECHOCARDIOGRAM COMPLETE  Result Date: 02/13/2020    ECHOCARDIOGRAM REPORT   Patient Name:   GRATIA DISLA Date of Exam: 02/13/2020 Medical Rec #:  [id number]     Height:       62.0 in Accession #:    [id number]    Weight:       121.7 lb Date of Birth:  12/27/1950     BSA:          1.548 m Patient Age:    68 years      BP:           121/61 mmHg Patient  Gender: F             HR:           119 bpm. Exam Location:  Inpatient Procedure: 2D Echo, Cardiac Doppler and Color Doppler Indications:     Elevated Troponin.  History:         Patient has no prior history of Echocardiogram examinations.                  Risk Factors:Hypertension and Dyslipidemia. Abdominal Aortic                  Aneurysm.  Sonographer:     Tiffany Dance Referring Phys:  [id number] Aliene Beams Diagnosing Phys: Epifanio Lesches MD  Sonographer Comments: Echo performed with patient supine and on artificial respirator. IMPRESSIONS  1. Left ventricular ejection fraction, by estimation, is 65  to 70%. The left ventricle has normal function. The left ventricle has no regional wall motion abnormalities. There is mild left ventricular hypertrophy. Left ventricular diastolic parameters are indeterminate.  2. Right ventricular systolic function is mildly reduced. Hypokinesis of RV free wall with improved function at apex, suggestive of McConnell sign as can be seen in acute PE. The right ventricular size is moderately enlarged. Tricuspid regurgitation signal is inadequate for assessing PA pressure.  3. Left atrial size was mildly dilated.  4. The mitral valve is normal in structure. No evidence of mitral valve regurgitation.  5. The aortic valve was not well visualized. Aortic valve regurgitation is not visualized. No aortic stenosis is present. FINDINGS  Left Ventricle: Left ventricular ejection fraction, by estimation, is 65 to 70%. The left ventricle has normal function. The left ventricle has no regional wall motion abnormalities. The left ventricular internal cavity size was normal in size. There is  mild left ventricular hypertrophy. Left ventricular diastolic parameters are indeterminate. Right Ventricle: The right ventricular size is moderately enlarged. Right vetricular wall thickness was not assessed. Right ventricular systolic function is mildly reduced. Tricuspid regurgitation signal is  inadequate for assessing PA pressure. Left Atrium: Left atrial size was mildly dilated. Right Atrium: Right atrial size was normal in size. Pericardium: Trivial pericardial effusion is present. Mitral Valve: The mitral valve is normal in structure. No evidence of mitral valve regurgitation. Tricuspid Valve: The tricuspid valve is normal in structure. Tricuspid valve regurgitation is trivial. Aortic Valve: The aortic valve was not well visualized. Aortic valve regurgitation is not visualized. No aortic stenosis is present. Pulmonic Valve: The pulmonic valve was not well visualized. Pulmonic valve regurgitation is not visualized. Aorta: The aortic root and ascending aorta are structurally normal, with no evidence of dilitation. Venous: IVC assessment for right atrial pressure unable to be performed due to mechanical ventilation. IAS/Shunts: The interatrial septum was not well visualized.  LEFT VENTRICLE PLAX 2D LVIDd:         3.61 cm LVIDs:         1.94 cm LV PW:         1.17 cm LV IVS:        1.03 cm LVOT diam:     2.00 cm LV SV:         57 LV SV Index:   37 LVOT Area:     3.14 cm  RIGHT VENTRICLE             IVC RV Basal diam:  2.51 cm     IVC diam: 1.92 cm RV S prime:     14.90 cm/s TAPSE (M-mode): 2.0 cm LEFT ATRIUM             Index       RIGHT ATRIUM           Index LA diam:        3.20 cm 2.07 cm/m  RA Area:     15.20 cm LA Vol (A2C):   68.0 ml 43.93 ml/m RA Volume:   40.00 ml  25.84 ml/m LA Vol (A4C):   37.3 ml 24.10 ml/m LA Biplane Vol: 52.4 ml 33.85 ml/m  AORTIC VALVE LVOT Vmax:   108.50 cm/s LVOT Vmean:  67.450 cm/s LVOT VTI:    0.180 m  AORTA Ao Root diam: 3.20 cm Ao Asc diam:  3.20 cm MITRAL VALVE MV Area (PHT): 3.65 cm     SHUNTS MV Decel Time: 208 msec     Systemic  VTI:  0.18 m MV E velocity: 97.90 cm/s   Systemic Diam: 2.00 cm MV A velocity: 119.00 cm/s MV E/A ratio:  0.82 Epifanio Lesches MD Electronically signed by Epifanio Lesches MD Signature Date/Time: 02/13/2020/12:12:05 PM     Final (Updated)    VAS [place] LOWER EXTREMITY VENOUS (DVT)  Result Date: 02/15/2020  Lower Venous DVTStudy Indications: Pulmonary embolism.  Risk Factors: Confirmed PE. Limitations: Poor patient cooperation. and poor ultrasound/tissue interface. Comparison Study: No prior exam. Performing Technologist: Kennedy Bucker ARDMS, RVT  Examination Guidelines: A complete evaluation includes B-mode imaging, spectral Doppler, color Doppler, and power Doppler as needed of all accessible portions of each vessel. Bilateral testing is considered an integral part of a complete examination. Limited examinations for reoccurring indications may be performed as noted. The reflux portion of the exam is performed with the patient in reverse Trendelenburg.  +---------+---------------+---------+-----------+----------+--------------+ RIGHT    CompressibilityPhasicitySpontaneityPropertiesThrombus Aging +---------+---------------+---------+-----------+----------+--------------+ CFV      Full           Yes      Yes                                 +---------+---------------+---------+-----------+----------+--------------+ SFJ      Full                                                        +---------+---------------+---------+-----------+----------+--------------+ FV Prox  Full                                                        +---------+---------------+---------+-----------+----------+--------------+ FV Mid   Full                                                        +---------+---------------+---------+-----------+----------+--------------+ FV DistalFull                                                        +---------+---------------+---------+-----------+----------+--------------+ PFV      Full                                                        +---------+---------------+---------+-----------+----------+--------------+ POP      Full           Yes      Yes                                  +---------+---------------+---------+-----------+----------+--------------+ PTV      Full                                                        +---------+---------------+---------+-----------+----------+--------------+  PERO     Full                                                        +---------+---------------+---------+-----------+----------+--------------+   +---------+---------------+---------+-----------+----------+--------------+ LEFT     CompressibilityPhasicitySpontaneityPropertiesThrombus Aging +---------+---------------+---------+-----------+----------+--------------+ CFV      Full           Yes      Yes                                 +---------+---------------+---------+-----------+----------+--------------+ SFJ      Full                                                        +---------+---------------+---------+-----------+----------+--------------+ FV Prox  Full                                                        +---------+---------------+---------+-----------+----------+--------------+ FV Mid   Full                                                        +---------+---------------+---------+-----------+----------+--------------+ FV DistalFull                                                        +---------+---------------+---------+-----------+----------+--------------+ PFV      Full                                                        +---------+---------------+---------+-----------+----------+--------------+ POP      Full           Yes      Yes                                 +---------+---------------+---------+-----------+----------+--------------+ PTV      Full                                                        +---------+---------------+---------+-----------+----------+--------------+ PERO     Full                                                         +---------+---------------+---------+-----------+----------+--------------+  Summary: BILATERAL: - No evidence of deep vein thrombosis seen in the lower extremities, bilaterally.  RIGHT: - No cystic structure found in the popliteal fossa.  LEFT: - No cystic structure found in the popliteal fossa.  *See table(s) above for measurements and observations. Electronically signed by Gretta Began MD on 02/15/2020 at 6:37:14 PM.    Final    ECHOCARDIOGRAM LIMITED  Result Date: 02/22/2020    ECHOCARDIOGRAM LIMITED REPORT   Patient Name:   MAHUM BETTEN Date of Exam: 02/22/2020 Medical Rec #:  [id number]     Height:       62.0 in Accession #:    [id number]    Weight:       121.0 lb Date of Birth:  09-30-1951     BSA:          1.544 m Patient Age:    68 years      BP:           131/58 mmHg Patient Gender: F             HR:           119 bpm. Exam Location:  Inpatient Procedure: Limited Echo, Limited Color Doppler and Cardiac Doppler STAT ECHO Indications:    Pulmonary Embolus; Assessment of RV function and size.  History:        Patient has prior history of Echocardiogram examinations, most                 recent 02/13/2020. Risk Factors:Hypertension.  Sonographer:    Thurman Coyer RDCS (AE) Referring Phys: [id number] BRADLEY L ICARD IMPRESSIONS  1. Left ventricular ejection fraction, by estimation, is 65 to 70%. The left ventricle has hyperdynamic function. The left ventricle has no regional wall motion abnormalities. There is mild left ventricular hypertrophy. Left ventricular diastolic parameters are consistent with Grade I diastolic dysfunction (impaired relaxation).  2. Right ventricular systolic function is normal. The right ventricular size is normal. Mildly increased right ventricular wall thickness.  3. Moderate pleural effusion in the left lateral region.  4. The aortic valve is tricuspid. Aortic valve regurgitation is not visualized. Mild aortic valve sclerosis is present, with no evidence of aortic valve stenosis.   5. The inferior vena cava is normal in size with greater than 50% respiratory variability, suggesting right atrial pressure of 3 mmHg. Comparison(s): Changes from prior study are noted. 02/13/2020: LVEF 65-70%, RV hypokinesis. Compared to this study, the RV appears to be improved - less dilated and more contractile. FINDINGS  Left Ventricle: Left ventricular ejection fraction, by estimation, is 65 to 70%. The left ventricle has hyperdynamic function. The left ventricle has no regional wall motion abnormalities. The left ventricular internal cavity size was normal in size. There is mild left ventricular hypertrophy. Normal left ventricular filling pressure. Right Ventricle: The right ventricular size is normal. Mildly increased right ventricular wall thickness. Right ventricular systolic function is normal. Left Atrium: Left atrial size was normal in size. Right Atrium: Right atrial size was normal in size. Pericardium: There is no evidence of pericardial effusion. Aortic Valve: The aortic valve is tricuspid. Aortic valve regurgitation is not visualized. Mild aortic valve sclerosis is present, with no evidence of aortic valve stenosis. Venous: The inferior vena cava is normal in size with greater than 50% respiratory variability, suggesting right atrial pressure of 3 mmHg. IAS/Shunts: No atrial level shunt detected by color flow Doppler. Additional Comments: There is a moderate pleural effusion in the left lateral region.  LEFT VENTRICLE PLAX 2D LVIDd:         3.70 cm Diastology LVIDs:         2.70 cm LV e' lateral:   10.20 cm/s LV PW:         1.00 cm LV E/e' lateral: 7.7 LV IVS:        1.00 cm LV e' medial:    8.81 cm/s                        LV E/e' medial:  8.9  RIGHT VENTRICLE RV S prime:     20.00 cm/s TAPSE (M-mode): 2.1 cm LEFT ATRIUM           Index       RIGHT ATRIUM           Index LA diam:      3.40 cm 2.20 cm/m  RA Area:     10.20 cm LA Vol (A4C): 44.9 ml 29.08 ml/m RA Volume:   18.10 ml  11.72 ml/m    AORTA Ao Root diam: 3.00 cm MITRAL VALVE MV Area (PHT): 4.17 cm MV Decel Time: 182 msec MV E velocity: 78.30 cm/s MV A velocity: 125.00 cm/s MV E/A ratio:  0.63 Zoila Shutter MD Electronically signed by Zoila Shutter MD Signature Date/Time: 02/22/2020/10:18:25 AM    Final     Microbiology: No results found for this or any previous visit (from the past 240 hour(s)).   Labs: Basic Metabolic Panel: Recent Labs  Lab 02/27/20 1003 02/27/20 1730 02/28/20 0919 02/29/20 0305 03/01/20 0259 03/02/20 0313  NA 140  --  139 138 137 138  K 3.1*  --  3.5 4.3 5.2* 3.7  CL 92*  --  96* 98 99 99  CO2 40*  --  35* 31 29 30   GLUCOSE 139*  --  149* 92 106* 98  BUN 8  --  12 12 14 12   CREATININE 0.44  --  0.46 0.35* 0.46 0.42*  CALCIUM 8.1*  --  8.6* 8.4* 8.7* 8.5*  MG  --  1.5*  --  1.9 1.7 1.9  PHOS  --   --   --   --  3.8 3.9   Liver Function Tests: Recent Labs  Lab 03/01/20 0259 03/02/20 0313  AST 32 29  ALT 31 32  ALKPHOS 65 65  BILITOT 0.8 0.3  PROT 5.9* 5.9*  ALBUMIN 2.5* 2.5*   No results for input(s): LIPASE, AMYLASE in the last 168 hours. No results for input(s): AMMONIA in the last 168 hours. CBC: Recent Labs  Lab 02/25/20 1237 02/26/20 1018 02/29/20 0305 03/01/20 0259 03/02/20 0313  WBC 10.2 7.8 8.2 8.0 8.9  NEUTROABS 9.1* 6.7  --  5.8 6.8  HGB 7.9* 8.1* 8.1* 8.2* 8.1*  HCT 24.4* 24.9* 25.0* 24.9* 25.2*  MCV 119.0* 119.7* 117.4* 117.5* 117.8*  PLT 227 230 286 304 323   Cardiac Enzymes: No results for input(s): CKTOTAL, CKMB, CKMBINDEX, TROPONINI in the last 168 hours. BNP: BNP (last 3 results) Recent Labs    02/12/20 0008  BNP 2,120.5*    ProBNP (last 3 results) No results for input(s): PROBNP in the last 8760 hours.  CBG: Recent Labs  Lab 02/28/20 1516 02/28/20 2048 02/28/20 2312 02/29/20 0322 02/29/20 0720  GLUCAP 126* 101* 138* 89 97       Signed:  Carolyne Littles, MD [facility] [phone] pager

## 2020-03-02 NOTE — TOC Transition Note (Signed)
Transition of Care [facility]) - CM/SW Discharge Note   Patient Details  Name: THOMASINE KLUTTS MRN: [id number] Date of Birth: [birth year]  Transition [facility]) CM/SW Contact:  Beckie Busing, RN Phone Number: 03/02/2020, 11:17 AM   Clinical Narrative:   Patient to discharge to CIR. TOC signing off.    Final next level of care: IP Rehab Facility Barriers to Discharge: No Barriers Identified   Patient Goals and CMS Choice Patient states their goals for this hospitalization and ongoing recovery are:: to go home CMS Medicare.gov Compare Post Acute Care list provided to:: Patient Choice offered to / list presented to : Patient  Discharge Placement                  Name of family member notified: Patient and Husband John at bedside Patient and family notified of of transfer: 03/02/20  Discharge Plan and Services   Discharge Planning Services: CM Consult Post Acute Care Choice: Skilled Nursing Facility                               Social Determinants of Health (SDOH) Interventions     Readmission Risk Interventions No flowsheet data found.

## 2020-03-02 NOTE — Progress Notes (Signed)
Physical Therapy Treatment Patient Details Name: Dawn Beasley MRN: [id number] DOB: [birth year] Today's Date: 03/02/2020    History of Present Illness Pt adm with acute hypoxemic respiratory failure due to aspiration PNA. Intubated 3/27-3/29. Transferred out of ICU 3/30 and back into ICU on 4/1 with respiratory distress and placed on bipap.    PMH - HTN, etoh abuse, opioid abuse, spinal stenosis, copd, AAA    PT Comments    Pt in bed upon arrival of PT, agreeable to PT session with focus on progression of activity tolerance and walking endurance. The pt was able to progress functional ambulation distance from 2 bouts of 55 ft to one bout of 75 ft and one bout of 50 ft with seated rest between. The pt continues to be limited by sig and rapid fatigue with exertion, reports modified borg of 10/10 following second bout of ambulation and requiring >5 min seated rest to recover. The pt was able to ambulate on RA with SpO2 >92%, but HR increased to 140s with ambulation. The pt will continue to benefit from skilled PT to further progress functional strength and endurance prior to d/c home.      Follow Up Recommendations  CIR;Supervision/Assistance - 24 hour     Equipment Recommendations  Other (comment)(defer to post acute)    Recommendations for Other Services       Precautions / Restrictions Precautions Precautions: Fall Precaution Comments: watch resp status Restrictions Weight Bearing Restrictions: No    Mobility  Bed Mobility Overal bed mobility: Needs Assistance Bed Mobility: Supine to Sit   Sidelying to sit: Supervision;HOB elevated       General bed mobility comments: Pt able to rise to sitting EOB without assist, extra time needed + bed rails  Transfers Overall transfer level: Needs assistance Equipment used: Rolling walker (2 wheeled) Transfers: Sit to/from Stand Sit to Stand: Min guard         General transfer comment: To ensure balance and safety, sig VC needed  each time for hand positioning. Pt sits impusively when fatigued, poor safety awareness  Ambulation/Gait Ambulation/Gait assistance: Min guard Gait Distance (Feet): 75 Feet(75 ft + 50 ft) Assistive device: Rolling walker (2 wheeled) Gait Pattern/deviations: Step-through pattern Gait velocity: 0.5 m/s Gait velocity interpretation: <1.8 ft/sec, indicate of risk for recurrent falls General Gait Details: continues with mild unsteadiness, drifting and waggle.  Noticeably fatiguing with increased RR, HR rising in to the 140's, sats 92-96%.  All on RA   Stairs             Wheelchair Mobility    Modified Rankin (Stroke Patients Only)       Balance Overall balance assessment: Needs assistance Sitting-balance support: Bilateral upper extremity supported;Feet supported Sitting balance-Leahy Scale: Good Sitting balance - Comments: sat without UE support, but prefers BUE support   Standing balance support: Bilateral upper extremity supported Standing balance-Leahy Scale: Fair Standing balance comment: reliant on AD and external support                            Cognition Arousal/Alertness: Awake/alert Behavior During Therapy: WFL for tasks assessed/performed Overall Cognitive Status: Within Functional Limits for tasks assessed                                 General Comments: A&O x 4. Pt able to follow one step commands without difficulty.  Exercises      General Comments        Pertinent Vitals/Pain Pain Assessment: Faces Faces Pain Scale: Hurts little more Pain Location: back Pain Descriptors / Indicators: Discomfort;Grimacing;Guarding Pain Intervention(s): Limited activity within patient's tolerance;Monitored during session;Repositioned;Premedicated before session    Home Living                      Prior Function            PT Goals (current goals can now be found in the care plan section) Acute Rehab PT Goals Patient  Stated Goal: return home PT Goal Formulation: With patient Time For Goal Achievement: 03/03/20 Potential to Achieve Goals: Fair Progress towards PT goals: Progressing toward goals    Frequency    Min 3X/week      PT Plan Current plan remains appropriate    Co-evaluation              AM-PAC PT "6 Clicks" Mobility   Outcome Measure  Help needed turning from your back to your side while in a flat bed without using bedrails?: A Little Help needed moving from lying on your back to sitting on the side of a flat bed without using bedrails?: A Little Help needed moving to and from a bed to a chair (including a wheelchair)?: A Little Help needed standing up from a chair using your arms (e.g., wheelchair or bedside chair)?: A Little Help needed to walk in hospital room?: A Little Help needed climbing 3-5 steps with a railing? : A Lot 6 Click Score: 17    End of Session Equipment Utilized During Treatment: Oxygen Activity Tolerance: Patient tolerated treatment well;Patient limited by fatigue Patient left: in chair;with call bell/phone within reach;with family/visitor present Nurse Communication: Mobility status PT Visit Diagnosis: Unsteadiness on feet (R26.81);Other abnormalities of gait and mobility (R26.89);Muscle weakness (generalized) (M62.81)     Time: 6269-4854 PT Time Calculation (min) (ACUTE ONLY): 29 min  Charges:  $Gait Training: 23-37 mins                     Rolm Baptise, PT, DPT   Acute Rehabilitation Department Pager #: [phone]   Gaetana Michaelis 03/02/2020, 11:36 AM

## 2020-03-02 NOTE — Progress Notes (Signed)
PMR Admission Coordinator Pre-Admission Assessment   Patient: Dawn Beasley is an 69 y.o., female MRN: [id number] DOB: [birth year] Height: _0  (157.5 cm) Weight: 53.7 kg   Insurance Information HMO:     PPO: yes     PCP:      IPA:      80/20:      OTHER:  PRIMARY: [employer]      Policy#: [id number]      Subscriber: pt CM Name: Marlowe Kays      Phone#: [phone]     Fax#: EPIC access Pre-Cert#: [id number]      Employer:  Benefits:  Phone #: [phone]     Name:  [name] Date: 02/15/2018     Deduct: $0      Out of Pocket Max: $3400 ($30 met)      Life Max: n/a CIR: $295/day for days 1-6      SNF: 20 full days Outpatient:      Co-Pay: $15/visit Home Health: 100%      Co-Pay:  DME: 80%     Co-Pay: 20% Providers: preferred network SECONDARY:       Policy#:      Phone#:    Development worker, community:       Phone#:    The Engineer, petroleum" for patients in Inpatient Rehabilitation Facilities with attached "Privacy Act [place] Records" was provided and verbally reviewed with: Patient and Family   Emergency Contact Information         Contact Information     Name Relation Home Work Mobile    Dawn,Beasley Spouse     [phone]         Current Medical History  Patient Admitting Diagnosis: debility, respiratory failure, aspiration PNA   History of Present Illness: Dawn Beasley. [name] is a 69 year old right-handed female with history of thoracic aortic aneurysm, chronic gastric dysmotility, hyperlipidemia, tobacco/alcohol abuse, hypertension, panic disorder, chronic back pain lumbar spinal cord stimulator insertion 12/06/2015 per Dr. Rolena Infante.    Presented 02/11/2020 after witnessed syncopal event.  Patient reportedly had a recent endoscopy colonoscopy on 02/06/2020 with findings of a single localized nonbleeding erosion found in the cecum as well as colitis and reactive gastropathy.  No stigmata of recent bleeding was seen.    Patient had episode of vomiting was orthostatic  received bolus of IV fluids.  Noted oxygen saturation 78% on room air started on 4 L..  Admission chemistry with hemoglobin 8.6, WBC 1.3, platelet 141,000, lactic acid 2.3, sodium 127, BUN 50, creatinine 1.69, D-dimer 2.15, troponin high-sensitivity 523, CK 1746, blood cultures no growth to date.  Cranial CT scan showed no acute hemorrhage.  Small vessel ischemic changes throughout the periventricular white matter left basal ganglia, likely chronic.  CT angiogram of the chest showed acute pulmonary embolus within the segmental branch of the right lower lobe pulmonary artery.  Interval development of bilateral pleural effusions left greater than right.  Stable appearance of ascending thoracic aortic aneurysm 4.1 cm.  Patient did require intubation for airway protection and extubated 02/13/2020.  She was placed on Eliquis after findings of pulmonary emboli.  Subsequent echocardiogram did not show any heart strain.  She remained in the ICU for acute metabolic encephalopathy/septic shock.  She was transferred out of the ICU 02/24/2020.  Hospital course complicated by mild acute on chronic diastolic congestive heart failure started on IV Lasix with good diuresis.  Patient had been maintained on broad-spectrum antibiotics x10 days and since discontinued.  Her renal function has  has been advanced to regular consistency.  Her mental status has continued to improve and a follow-up cranial CT scan was completed 02/15/2020 again negative for acute changes suspect acute metabolic encephalopathy.  Therapy evaluations completed and patient was recommended for a comprehensive rehab program.    Patient's medical record from [facility] has been reviewed by the rehabilitation admission coordinator and physician.  Past Medical History  Past Medical History:  Diagnosis Date  . AAA (abdominal aortic aneurysm)  (HCC)   . Anxiety   . Arthritis   . Back pain   . Cervical disc herniation   . Depression   . 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 Hypercholesteremia   . Hypertension   . Panic disorder   . Stenosis, spinal, lumbar     Family History   family history includes Cancer in her sister.  Prior Rehab/Hospitalizations Has the patient had prior rehab or hospitalizations prior to admission? No  Has the patient had major surgery during 100 days prior to admission? No   Current Medications  Current Facility-Administered Medications:  .  0.9 %  sodium chloride infusion, , Intravenous, PRN, Akula, Vijaya, MD, Stopped at 02/16/20 1146 .  acetaminophen (TYLENOL) tablet 650 mg, 650 mg, Oral, Q6H PRN, 650 mg at 02/24/20 2150 **OR** acetaminophen (TYLENOL) suppository 650 mg, 650 mg, Rectal, Q6H PRN, Akula, Vijaya, MD .  ALPRAZolam (XANAX) tablet 0.5 mg, 0.5 mg, Oral, BID PRN, Akula, Vijaya, MD, 0.5 mg at 03/01/20 0516 .  apixaban (ELIQUIS) tablet 5 mg, 5 mg, Oral, BID, Akula, Vijaya, MD, 5 mg at 03/02/20 0923 .  bethanechol (URECHOLINE) tablet 5 mg, 5 mg, Oral, TID, Akula, Vijaya, MD, 5 mg at 03/02/20 0924 .  bisacodyl (DULCOLAX) suppository 10 mg, 10 mg, Rectal, Daily PRN, Akula, Vijaya, MD .  Chlorhexidine Gluconate Cloth 2 % PADS 6 each, 6 each, Topical, Daily, Akula, Vijaya, MD, 6 each at 03/02/20 0924 .  feeding supplement (ENSURE ENLIVE) (ENSURE ENLIVE) liquid 237 mL, 237 mL, Oral, TID BM, Akula, Vijaya, MD, 237 mL at 03/02/20 0924 .  folic acid (FOLVITE) tablet 1 mg, 1 mg, Per Tube, Daily, Akula, Vijaya, MD, 1 mg at 03/02/20 0923 .  furosemide (LASIX) injection 20 mg, 20 mg, Intravenous, Daily, Akula, Vijaya, MD, 20 mg at 03/02/20 0923 .  Gerhardt's butt cream, , Topical, TID, Akula, Vijaya, MD, Given at 03/02/20 0922 .  guaiFENesin (ROBITUSSIN) 100 MG/5ML solution 200 mg, 10 mL, Per Tube, Q6H, Akula, Vijaya, MD, 200 mg at 03/02/20 0923 .  hydrALAZINE (APRESOLINE) injection 10 mg, 10 mg,  Intravenous, Q6H PRN, Akula, Vijaya, MD .  hydrALAZINE (APRESOLINE) tablet 25 mg, 25 mg, Oral, Q8H, Akula, Vijaya, MD, 25 mg at 03/02/20 0614 .  levalbuterol (XOPENEX) nebulizer solution 0.63 mg, 0.63 mg, Nebulization, Q2H PRN, Akula, Vijaya, MD, 0.63 mg at 02/16/20 1217 .  MEDLINE mouth rinse, 15 mL, Mouth Rinse, BID, Akula, Vijaya, MD, 15 mL at 03/02/20 0924 .  multivitamin with minerals tablet 1 tablet, 1 tablet, Per Tube, Daily, Akula, Vijaya, MD, 1 tablet at 03/02/20 0923 .  nicotine (NICODERM CQ - dosed in mg/24 hours) patch 21 mg, 21 mg, Transdermal, Daily, Akula, Vijaya, MD, 21 mg at 03/02/20 0923 .  ondansetron (ZOFRAN) tablet 4 mg, 4 mg, Oral, Q6H PRN **OR** ondansetron (ZOFRAN) injection 4 mg, 4 mg, Intravenous, Q6H PRN, Akula, Vijaya, MD .  oxyCODONE (Oxy IR/ROXICODONE) immediate release tablet 5 mg, 5 mg, Oral, Q6H PRN, Akula, Vijaya, MD, 5 mg at 03/02/20 0926 .    pantoprazole (PROTONIX) EC tablet 40 mg, 40 mg, Oral, BID, Woods, Curtis J, MD, 40 mg at 03/02/20 0923 .  polyethylene glycol (MIRALAX / GLYCOLAX) packet 17 g, 17 g, Oral, Daily, Akula, Vijaya, MD, 17 g at 03/02/20 0922 .  Resource ThickenUp Clear, , Oral, PRN, Akula, Vijaya, MD .  thiamine tablet 100 mg, 100 mg, Per Tube, Daily, Akula, Vijaya, MD, 100 mg at 03/02/20 0923 .  zolpidem (AMBIEN) tablet 5 mg, 5 mg, Oral, QHS PRN, Akula, Vijaya, MD, 5 mg at 03/01/20 2254  Patients Current Diet:  Diet Order            Diet regular Room service appropriate? Yes; Fluid consistency: Thin  Diet effective now              Precautions / Restrictions Precautions Precautions: Fall Precaution Comments: watch resp status Restrictions Weight Bearing Restrictions: No   Has the patient had 2 or more falls or a fall with injury in the past year? No  Prior Activity Level Limited Community (1-2x/wk): no DME at baseline, no falls  Prior Functional Level Self Care: Did the patient need help bathing, dressing, using the toilet or  eating? Independent  Indoor Mobility: Did the patient need assistance with walking from room to room (with or without device)? Independent  Stairs: Did the patient need assistance with internal or external stairs (with or without device)? Independent  Functional Cognition: Did the patient need help planning regular tasks such as shopping or remembering to take medications? Independent  Home Assistive Devices / Equipment Home Equipment: Cane - single point  Prior Device Use: Indicate devices/aids used by the patient prior to current illness, exacerbation or injury? None of the above  Current Functional Level Cognition  Overall Cognitive Status: Within Functional Limits for tasks assessed Current Attention Level: Sustained Orientation Level: Oriented X4 Following Commands: Follows one step commands with increased time, Follows one step commands inconsistently Safety/Judgement: Decreased awareness of safety, Decreased awareness of deficits General Comments: A&O x 4. Pt able to follow one step commands without difficulty.    Extremity Assessment (includes Sensation/Coordination)  Upper Extremity Assessment: Generalized weakness  Lower Extremity Assessment: Generalized weakness    ADLs  Overall ADL's : Needs assistance/impaired Eating/Feeding: NPO Grooming: Wash/dry hands, Wash/dry face, Oral care, Brushing hair, Set up, Supervision/safety, Sitting Upper Body Bathing: Set up, Supervision/ safety, Sitting Lower Body Bathing: Supervison/ safety, Min guard, Sit to/from stand Lower Body Bathing Details (indicate cue type and reason): Supervision while seated, min guard while standing to complete Upper Body Dressing : Moderate assistance, Sitting Lower Body Dressing: Set up, Supervision/safety, Sitting/lateral leans Lower Body Dressing Details (indicate cue type and reason): While seated in bedside chair. Figure four position.  Toilet Transfer: +2 for safety/equipment, Stand-pivot, BSC, +2  for physical assistance, Moderate assistance Toilet Transfer Details (indicate cue type and reason): max A to boost up hips and remain steady Toileting- Clothing Manipulation and Hygiene: +2 for physical assistance, +2 for safety/equipment, Maximal assistance Toileting - Clothing Manipulation Details (indicate cue type and reason): bed level peri care after BM in bed Functional mobility during ADLs: Min guard, Rolling walker General ADL Comments: Pt tolerated standing 1 x 1 min with min guard to complete peri care. Pt engaged in seated self-care tasks.     Mobility  Overal bed mobility: Needs Assistance Bed Mobility: Supine to Sit Rolling: Min assist Sidelying to sit: Min guard Supine to sit: HOB elevated Sit to supine: Max assist General bed mobility comments: Pt seated   Details (indicate cue type and reason): bed level peri care after BM in bed Functional mobility during ADLs: Min guard, Rolling walker General ADL Comments: Pt tolerated standing 1 x 1 min with min guard to complete peri care. Pt engaged in seated self-care tasks.      Mobility   Overal bed mobility: Needs Assistance Bed Mobility: Supine to Sit Rolling: Min assist Sidelying to sit: Min guard Supine to sit: HOB elevated Sit to supine: Max assist General bed mobility comments: Pt seated in bedside chair upon OT arrival     Transfers   Overall transfer level: Needs assistance Equipment used: Rolling walker (2 wheeled) Transfers: Sit to/from Stand Sit to Stand: Min guard Stand pivot transfers: Min assist, +2 physical assistance, +2 safety/equipment General transfer comment: To ensure balance and safety     Ambulation / Gait / Stairs / Wheelchair Mobility   Ambulation/Gait Ambulation/Gait assistance: Min Web designer (Feet): 55 Feet(x2) Assistive device: Rolling walker (2 wheeled) Gait Pattern/deviations: Step-through pattern General Gait Details: continues with mild unsteadiness, drifting and waggle.  Noticeably fatiguing  HR rising in to the low 130's  sats 94%.  All on 3L [place] Gait velocity: decr Gait velocity interpretation: <1.8 ft/sec, indicate of risk for recurrent falls     Posture / Balance Dynamic Sitting Balance Sitting balance - Comments: sat with no UE's and reached outside BOS with each hand from left to right to give "high fives" Balance Overall balance assessment: Needs  assistance Sitting-balance support: Bilateral upper extremity supported, Feet supported Sitting balance-Leahy Scale: Good Sitting balance - Comments: sat with no UE's and reached outside BOS with each hand from left to right to give "high fives" Postural control: Right lateral lean Standing balance support: Bilateral upper extremity supported Standing balance-Leahy Scale: Fair Standing balance comment: reliant on AD and external support     Special needs/care consideration Oxygen 2L, Skin blisters to L hand Designated visitor spouse, [place] (from acute therapy documentation) Living Arrangements: Spouse/significant other Available Help at Discharge: Family, Available PRN/intermittently Type of Home: House Home Layout: Two level, Able to live on main level with bedroom/bathroom Alternate Level Stairs-Rails: Left Alternate Level Stairs-Number of Steps: flight Home Access: Stairs to enter Technical brewer of Steps: 2 Bathroom Shower/Tub: Government social research officer Accessibility: Yes   Discharge Living Setting Plans for Discharge Living Setting: Patient's home Type of Home at Discharge: House Discharge Home Layout: Two level, Able to live on main level with bedroom/bathroom Alternate Level Stairs-Rails: Left Alternate Level Stairs-Number of Steps: flight Discharge Home Access: Stairs to enter Entrance Stairs-Rails: None Entrance Stairs-Number of Steps: 2 Discharge Bathroom Shower/Tub: Tub/shower unit Discharge Bathroom Toilet: Standard Discharge Bathroom Accessibility: Yes How Accessible: Accessible via walker Does the patient have any problems obtaining your medications?: No   Social/Family/Support Systems Patient Roles: Spouse Anticipated Caregiver: Shallon Yaklin (spouse) Anticipated Caregiver's Contact Information: [phone] cell Ability/Limitations of Caregiver: works Careers adviser: Evenings only Discharge  Plan Discussed with Primary Caregiver: Yes Is Caregiver In Agreement with Plan?: Yes Does Caregiver/Family have Issues with Lodging/Transportation while Pt is in Rehab?: No   Goals Patient/Family Goal for Rehab: PT/OT supervision to mod I Expected length of stay: 10-12 days Pt/Family Agrees to Admission and willing to participate: Yes Program Orientation Provided & Reviewed with Pt/Caregiver Including Roles  & Responsibilities: Yes  Barriers to Discharge: Decreased caregiver support  Barriers to Discharge Comments: husband works   Decrease burden of Care through IP rehab admission:  n/a   Possible need for SNF placement upon discharge: not anticipated   Patient Condition: I have reviewed medical records from [facility], spoken with CM, and patient and spouse. I met with patient at the bedside for inpatient rehabilitation assessment.  Patient will benefit from ongoing PT, OT  can actively participate in 3 hours of therapy a day 5 days of the week, and can make measurable gains during the admission.  Patient will also benefit from the coordinated team approach during an Inpatient Acute Rehabilitation admission.  The patient will receive intensive therapy as well as Rehabilitation physician, nursing, social worker, and care management interventions.  Due to safety, skin/wound care, medication administration, pain management and patient education the patient requires 24 hour a day rehabilitation nursing.  The patient is currently min assist with mobility and basic ADLs.  Discharge setting and therapy post discharge at home is anticipated.  Patient has agreed to participate in the Acute Inpatient Rehabilitation Program and will admit today.   Preadmission Screen Completed By:  Michel Santee, PT, DPT 03/02/2020 10:37 AM ______________________________________________________________________   Discussed status with Dr. Naaman Plummer on 03/02/20  at 10:44 AM  and received approval for admission  today.   Admission Coordinator:  Michel Santee, PT, DPT time 10:44 AM Sudie Grumbling 03/02/20     Assessment/Plan: Diagnosis: debility after aspiration pneumonia and respiratory failure 1. Does the need for close, 24 hr/day Medical supervision in concert with the patient's rehab needs make it unreasonable for this patient to be served in a less intensive setting? Yes 2. Co-Morbidities requiring supervision/potential complications: HTN, chronic low back pain, gastric dysmotility, cdCHF, PE 3. Due to bladder management, bowel management, safety, skin/wound care, disease management, medication administration, pain management and patient education, does the patient require 24 hr/day rehab nursing? Yes 4. Does the patient require coordinated care of a physician, rehab nurse, PT, OT to address physical and functional deficits in the context of the above medical diagnosis(es)? Yes Addressing deficits in the following areas: balance, endurance, locomotion, strength, transferring, bowel/bladder control, bathing, dressing, feeding, grooming, toileting and psychosocial support 5. Can the patient actively participate in an intensive therapy program of at least 3 hrs of therapy 5 days a week? Yes 6. The potential for patient to make measurable gains while on inpatient rehab is excellent 7. Anticipated functional outcomes upon discharge from inpatient rehab: modified independent and supervision PT, modified independent and supervision OT, n/a SLP 8. Estimated rehab length of stay to reach the above functional goals is: 10-12 days 9. Anticipated discharge destination: Home 10. Overall Rehab/Functional Prognosis: excellent     MD Signature: Meredith Staggers, MD, [place] [facility] 03/02/2020

## 2020-03-02 NOTE — Progress Notes (Signed)
Inpatient Rehab Admissions Coordinator:   I have a bed available for this patient to admit to CIR today.  Dr. Joseph Art in agreement.  Will let pt/family and CM know.   Estill Dooms, PT, DPT Admissions Coordinator [phone] 03/02/20  10:36 AM

## 2020-03-02 NOTE — H&P (Signed)
Physical Medicine and Rehabilitation Admission H&P        Chief Complaint  Patient presents with  . Near Syncope  : HPI: Dawn Beasley is a 69 year old right-handed female with history of thoracic aortic aneurysm, chronic gastric dysmotility, hyperlipidemia, tobacco/alcohol abuse, hypertension, panic disorder, chronic back pain lumbar spinal cord stimulator insertion 12/06/2015 per Dr. Shon Baton.  Per chart review patient lives with spouse independent prior to admission a bit sedentary.  Two-level home bed and bath on main level 2 steps to entry.  Husband can assist as needed.  Presented 02/11/2020 after witnessed syncopal event.  Patient reportedly had a recent endoscopy colonoscopy on 02/06/2020 with findings of a single localized nonbleeding erosion found in the cecum as well as colitis and reactive gastropathy.  No stigmata of recent bleeding was seen.    Patient had episode of vomiting was orthostatic received bolus of IV fluids.  Noted oxygen saturation 78% on room air started on 4 L..  Admission chemistry with hemoglobin 8.6, WBC 1.3, platelet 141,000, lactic acid 2.3, sodium 127, BUN 50, creatinine 1.69, D-dimer 2.15, troponin high-sensitivity 523, CK 1746, blood cultures no growth to date.  Cranial CT scan showed no acute hemorrhage.  Small vessel ischemic changes throughout the periventricular white matter left basal ganglia, likely chronic.  CT angiogram of the chest showed acute pulmonary embolus within the segmental branch of the right lower lobe pulmonary artery.  Interval development of bilateral pleural effusions left greater than right.  Stable appearance of ascending thoracic aortic aneurysm 4.1 cm.  Patient did require intubation for airway protection and extubated 02/13/2020.  She was placed on Eliquis after findings of pulmonary emboli.  Subsequent echocardiogram did not show any heart strain.  She remained in the ICU for acute metabolic encephalopathy/septic shock.  She was  transferred out of the ICU 02/24/2020.  Hospital course complicated by mild acute on chronic diastolic congestive heart failure started on IV Lasix with good diuresis.  Patient had been maintained on broad-spectrum antibiotics x10 days and since discontinued.  Her renal function has steadily improved after gentle IV fluids latest creatinine 0.42.  Pancytopenia hemoglobin 8.1, WBC improved 8900, RBC 2.14.  Her diet has been advanced to regular consistency.  Her mental status has continued to improve and a follow-up cranial CT scan was completed 02/15/2020 again negative for acute changes suspect acute metabolic encephalopathy.  Therapy evaluations completed and patient was admitted for a comprehensive rehab program.   Review of Systems  Constitutional: Negative for chills and fever.       Limited p.o. intake  HENT: Negative for hearing loss.   Eyes: Negative for blurred vision and double vision.  Respiratory: Positive for shortness of breath. Negative for cough.   Cardiovascular: Positive for leg swelling. Negative for chest pain and palpitations.  Gastrointestinal: Positive for nausea and vomiting. Negative for heartburn.       Hematemesis  Genitourinary: Negative for dysuria, flank pain and hematuria.  Musculoskeletal: Positive for back pain, joint pain and myalgias.  Skin: Negative for rash.  Neurological: Positive for weakness.  Psychiatric/Behavioral: The patient has insomnia.        Panic disorder  All other systems reviewed and are negative.       Past Medical History:  Diagnosis Date  . AAA (abdominal aortic aneurysm) (HCC)    . Anxiety    . Arthritis    . Back pain    . Cervical disc herniation    . Depression    .  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 Hypercholesteremia    . Hypertension    . Panic disorder    . Stenosis, spinal, lumbar           Past Surgical History:  Procedure Laterality Date  . APPENDECTOMY      . BACK SURGERY      . CARPAL TUNNEL RELEASE      . LUMBAR  LAMINECTOMY/DECOMPRESSION MICRODISCECTOMY N/A 04/12/2014    Procedure: MICRO LUMBAR DECOMPRESSION LUMBAR THREE TO FOUR,LUMBAR  FOUR TO FIVE;  Surgeon: Johnn Hai, MD;  Location: [facility];  Service: Orthopedics;  Laterality: N/A;  . MAXOFACIAL SURGERY      . SPINAL CORD STIMULATOR INSERTION N/A 12/06/2015    Procedure: LUMBAR SPINAL CORD STIMULATOR INSERTION;  Surgeon: Melina Schools, MD;  Location: [facility];  Service: Orthopedics;  Laterality: N/A;         Family History  Problem Relation Age of Onset  . Cancer Sister      Social History:  reports that she has been smoking. She has a 20.00 pack-year smoking history. She does not have any smokeless tobacco history on file. She reports current alcohol use. She reports that she does not use drugs. Allergies:       Allergies  Allergen Reactions  . Celebrex [Celecoxib] Swelling      Throat swelling  . Sulfa Antibiotics Swelling      Throat swelling          Medications Prior to Admission  Medication Sig Dispense Refill  . ALPRAZolam (XANAX) 1 MG tablet Take 0.5 tablets (0.5 mg total) by mouth 2 (two) times daily. 30 tablet 0  . finasteride (PROSCAR) 5 MG tablet Take 5 mg by mouth daily.      Marland Kitchen losartan (COZAAR) 100 MG tablet Take 100 mg by mouth daily.      . ondansetron (ZOFRAN) 4 MG tablet Take 1 tablet (4 mg total) by mouth every 8 (eight) hours as needed for nausea or vomiting. 20 tablet 0  . oxyCODONE-acetaminophen (PERCOCET) 10-325 MG tablet Take 1 tablet by mouth every 4 (four) hours as needed for pain. 60 tablet 0  . pantoprazole (PROTONIX) 40 MG tablet Take 1 tablet (40 mg total) by mouth 2 (two) times daily. 60 tablet 11  . pravastatin (PRAVACHOL) 40 MG tablet Take 40 mg by mouth daily.   5  . traZODone (DESYREL) 100 MG tablet Take 100 mg by mouth at bedtime as needed for sleep.      Marland Kitchen gabapentin (NEURONTIN) 600 MG tablet Take 1 tablet (600 mg total) by mouth 3 (three) times daily. (Patient not taking: Reported on 02/12/2020) 30  tablet 0      Drug Regimen Review Drug regimen was reviewed and remains appropriate with no significant issues identified   Home: Home Living Family/patient expects to be discharged to:: Private residence Living Arrangements: Spouse/significant other Available Help at Discharge: Family, Available PRN/intermittently Type of Home: House Home Access: Stairs to enter Technical brewer of Steps: 2 Home Layout: Two level, Able to live on main level with bedroom/bathroom Alternate Level Stairs-Number of Steps: flight Alternate Level Stairs-Rails: Left Bathroom Shower/Tub: Chiropodist: Standard Bathroom Accessibility: Yes Home Equipment: Cane - single point   Functional History: Prior Function Level of Independence: Independent with assistive device(s) Comments: limited by back pain   Functional Status:  Mobility: Bed Mobility Overal bed mobility: Needs Assistance Bed Mobility: Supine to Sit Rolling: Min assist Sidelying to sit: Min guard Supine to  sit: HOB elevated Sit to supine: Max assist General bed mobility comments: Pt seated in bedside chair upon OT arrival Transfers Overall transfer level: Needs assistance Equipment used: Rolling walker (2 wheeled) Transfers: Sit to/from Stand Sit to Stand: Min guard Stand pivot transfers: Min assist, +2 physical assistance, +2 safety/equipment General transfer comment: To ensure balance and safety Ambulation/Gait Ambulation/Gait assistance: Min assist Gait Distance (Feet): 55 Feet(x2) Assistive device: Rolling walker (2 wheeled) Gait Pattern/deviations: Step-through pattern General Gait Details: continues with mild unsteadiness, drifting and waggle.  Noticeably fatiguing  HR rising in to the low 130's  sats 94%.  All on 3L [place] Gait velocity: decr Gait velocity interpretation: <1.8 ft/sec, indicate of risk for recurrent falls   ADL: ADL Overall ADL's : Needs assistance/impaired Eating/Feeding:  NPO Grooming: Wash/dry hands, Wash/dry face, Oral care, Brushing hair, Set up, Supervision/safety, Sitting Upper Body Bathing: Set up, Supervision/ safety, Sitting Lower Body Bathing: Supervison/ safety, Min guard, Sit to/from stand Lower Body Bathing Details (indicate cue type and reason): Supervision while seated, min guard while standing to complete Upper Body Dressing : Moderate assistance, Sitting Lower Body Dressing: Set up, Supervision/safety, Sitting/lateral leans Lower Body Dressing Details (indicate cue type and reason): While seated in bedside chair. Figure four position.  Toilet Transfer: +2 for safety/equipment, Stand-pivot, BSC, +2 for physical assistance, Moderate assistance Toilet Transfer Details (indicate cue type and reason): max A to boost up hips and remain steady Toileting- Clothing Manipulation and Hygiene: +2 for physical assistance, +2 for safety/equipment, Maximal assistance Toileting - Clothing Manipulation Details (indicate cue type and reason): bed level peri care after BM in bed Functional mobility during ADLs: Min guard, Rolling walker General ADL Comments: Pt tolerated standing 1 x 1 min with min guard to complete peri care. Pt engaged in seated self-care tasks.    Cognition: Cognition Overall Cognitive Status: Within Functional Limits for tasks assessed Orientation Level: Oriented X4 Cognition Arousal/Alertness: Awake/alert Behavior During Therapy: WFL for tasks assessed/performed Overall Cognitive Status: Within Functional Limits for tasks assessed Area of Impairment: Memory, Safety/judgement, Problem solving, Following commands, Attention Current Attention Level: Sustained Memory: Decreased recall of precautions, Decreased short-term memory Following Commands: Follows one step commands with increased time, Follows one step commands inconsistently Safety/Judgement: Decreased awareness of safety, Decreased awareness of deficits Awareness:  Intellectual Problem Solving: Requires verbal cues, Requires tactile cues, Slow processing, Decreased initiation General Comments: A&O x 4. Pt able to follow one step commands without difficulty.   Physical Exam: Blood pressure 129/72, pulse (!) 104, temperature 98.1 F (36.7 C), temperature source Oral, resp. rate 20, height 5\' 2"  (1.575 m), weight 53.7 kg, SpO2 98 %. Physical Exam  Constitutional: She is oriented to person, place, and time.  Small build, frail appearing  HENT:  Head: Normocephalic and atraumatic.  Eyes: Pupils are equal, round, and reactive to light. Conjunctivae and EOM are normal.  Neck: No JVD present. No tracheal deviation present. No thyromegaly present.  Cardiovascular: Normal rate and regular rhythm.  Respiratory: She has no wheezes. She has no rales.  Pt taking shallow, rapid breaths. Denies being sob ("i'm nervous"), diminished breath sounds at the bases  GI: Soft. She exhibits no distension. There is no abdominal tenderness. There is no rebound.  Musculoskeletal:        General: No edema. Normal range of motion.     Cervical back: Normal range of motion.  Neurological: She is alert and oriented to person, place, and time. She has normal reflexes. No cranial nerve deficit. Coordination  normal.  Motor upper ext 4/5 prox to distal. LE: 3+ to 4-/5 prox to 4+ distally with ADF/PF. No sensory dysfunction.  Skin: Skin is warm. No rash noted. No erythema.  Psychiatric:  Pt pleasant but quite anxious      Lab Results Last 48 Hours        Results for orders placed or performed during the hospital encounter of 02/11/20 (from the past 48 hour(s))  Glucose, capillary     Status: None    Collection Time: 02/29/20  7:20 AM  Result Value Ref Range    Glucose-Capillary 97 70 - 99 mg/dL      Comment: Glucose reference range applies only to samples taken after fasting for at least 8 hours.  Comprehensive metabolic panel     Status: Abnormal    Collection Time: 03/01/20   2:59 AM  Result Value Ref Range    Sodium 137 135 - 145 mmol/L    Potassium 5.2 (H) 3.5 - 5.1 mmol/L    Chloride 99 98 - 111 mmol/L    CO2 29 22 - 32 mmol/L    Glucose, Bld 106 (H) 70 - 99 mg/dL      Comment: Glucose reference range applies only to samples taken after fasting for at least 8 hours.    BUN 14 8 - 23 mg/dL    Creatinine, Ser 1.61 0.44 - 1.00 mg/dL    Calcium 8.7 (L) 8.9 - 10.3 mg/dL    Total Protein 5.9 (L) 6.5 - 8.1 g/dL    Albumin 2.5 (L) 3.5 - 5.0 g/dL    AST 32 15 - 41 U/L    ALT 31 0 - 44 U/L    Alkaline Phosphatase 65 38 - 126 U/L    Total Bilirubin 0.8 0.3 - 1.2 mg/dL    GFR calc non Af Amer >60 >60 mL/min    GFR calc Af Amer >60 >60 mL/min    Anion gap 9 5 - 15      Comment: Performed at [facility], [address], [place], [place] [address]  Magnesium     Status: None    Collection Time: 03/01/20  2:59 AM  Result Value Ref Range    Magnesium 1.7 1.7 - 2.4 mg/dL      Comment: Performed at [facility], [address], [place], [place] [address]  Phosphorus     Status: None    Collection Time: 03/01/20  2:59 AM  Result Value Ref Range    Phosphorus 3.8 2.5 - 4.6 mg/dL      Comment: Performed at [facility], [address], [place], [place] [address]  CBC with Differential/Platelet     Status: Abnormal    Collection Time: 03/01/20  2:59 AM  Result Value Ref Range    WBC 8.0 4.0 - 10.5 K/uL    RBC 2.12 (L) 3.87 - 5.11 MIL/uL    Hemoglobin 8.2 (L) 12.0 - 15.0 g/dL    HCT 78.2 (L) 95.6 - 46.0 %    MCV 117.5 (H) 80.0 - 100.0 fL    MCH 38.7 (H) 26.0 - 34.0 pg    MCHC 32.9 30.0 - 36.0 g/dL    RDW 21.3 08.6 - 57.8 %    Platelets 304 150 - 400 K/uL    nRBC 0.0 0.0 - 0.2 %    Neutrophils Relative % 74 %    Neutro Abs 5.8 1.7 - 7.7 K/uL    Lymphocytes Relative  17 %    Lymphs Abs 1.3 0.7 - 4.0 K/uL    Monocytes Relative 7 %    Monocytes Absolute 0.6 0.1 - 1.0 K/uL    Eosinophils Relative 2 %    Eosinophils Absolute 0.2 0.0 - 0.5 K/uL     Basophils Relative 0 %    Basophils Absolute 0.0 0.0 - 0.1 K/uL    Immature Granulocytes 0 %    Abs Immature Granulocytes 0.03 0.00 - 0.07 K/uL      Comment: Performed at [facility], [address] [place], [place] [address]  Comprehensive metabolic panel     Status: Abnormal    Collection Time: 03/02/20  3:13 AM  Result Value Ref Range    Sodium 138 135 - 145 mmol/L    Potassium 3.7 3.5 - 5.1 mmol/L    Chloride 99 98 - 111 mmol/L    CO2 30 22 - 32 mmol/L    Glucose, Bld 98 70 - 99 mg/dL      Comment: Glucose reference range applies only to samples taken after fasting for at least 8 hours.    BUN 12 8 - 23 mg/dL    Creatinine, Ser 2.95 (L) 0.44 - 1.00 mg/dL    Calcium 8.5 (L) 8.9 - 10.3 mg/dL    Total Protein 5.9 (L) 6.5 - 8.1 g/dL    Albumin 2.5 (L) 3.5 - 5.0 g/dL    AST 29 15 - 41 U/L    ALT 32 0 - 44 U/L    Alkaline Phosphatase 65 38 - 126 U/L    Total Bilirubin 0.3 0.3 - 1.2 mg/dL    GFR calc non Af Amer >60 >60 mL/min    GFR calc Af Amer >60 >60 mL/min    Anion gap 9 5 - 15      Comment: Performed at [facility], [address], [place], [place] [address]  Magnesium     Status: None    Collection Time: 03/02/20  3:13 AM  Result Value Ref Range    Magnesium 1.9 1.7 - 2.4 mg/dL      Comment: Performed at [facility], [address], [place], [place] [address]  Phosphorus     Status: None    Collection Time: 03/02/20  3:13 AM  Result Value Ref Range    Phosphorus 3.9 2.5 - 4.6 mg/dL      Comment: Performed at [facility], [address] [place], [place] [address]  CBC with Differential/Platelet     Status: Abnormal    Collection Time: 03/02/20  3:13 AM  Result Value Ref Range    WBC 8.9 4.0 - 10.5 K/uL    RBC 2.14 (L) 3.87 - 5.11 MIL/uL    Hemoglobin 8.1 (L) 12.0 - 15.0 g/dL    HCT 36.6 (L) 44.0 - 46.0 %    MCV 117.8 (H) 80.0 - 100.0 fL    MCH 37.9 (H) 26.0 - 34.0 pg    MCHC 32.1 30.0 - 36.0 g/dL    RDW 34.7 42.5 - 95.6 %    Platelets  323 150 - 400 K/uL    nRBC 0.0 0.0 - 0.2 %    Neutrophils Relative % 77 %    Neutro Abs 6.8 1.7 - 7.7 K/uL    Lymphocytes Relative 12 %    Lymphs Abs 1.1 0.7 - 4.0 K/uL    Monocytes Relative 8 %    Monocytes Absolute 0.7 0.1 -  1.0 K/uL    Eosinophils Relative 3 %    Eosinophils Absolute 0.3 0.0 - 0.5 K/uL    Basophils Relative 0 %    Basophils Absolute 0.0 0.0 - 0.1 K/uL    Immature Granulocytes 0 %    Abs Immature Granulocytes 0.03 0.00 - 0.07 K/uL      Comment: Performed at [facility], [address] [place], [place] [id number]      Imaging Results (Last 48 hours)  No results found.           Medical Problem List and Plan: 1.  Decreased functional mobility with altered mental status secondary to acute respiratory failure with hypoxia extubated 02/13/2020/pulmonary emboli/acute metabolic encephalopathy             -patient may shower             -ELOS/Goals: 10-12 days, supervision to mod I goals 2.  Antithrombotics: -DVT/anticoagulation: Eliquis for pulmonary emboli             -antiplatelet therapy: n/a  3. Pain Management/chronic low back pain: pt uses her oxycodone 4x daily at home on a schedule. Will schedule her doses at 0700 and 1200 daily, and leave other doses up to her in the PM.              -has spinal cord stimulator per Dr. Shon BatonBrooks, Dr. Ethelene Halamos manages her chronic pain 4. Mood/panic disorder: Xanax 0.5 mg twice daily as needed             -pt was anxious about coming to rehab, people she might work with. Reassured her that we will take care and that she will do well.              -antipsychotic agents: N/A 5. Neuropsych: This patient is capable of making decisions on her own behalf. 6. Skin/Wound Care: Routine skin checks 7. Fluids/Electrolytes/Nutrition: Routine in and outs with follow-up chemistries 9.  AKI.  Resolved.  Follow-up chemistries on Monday 10.  Acute on chronic diastolic congestive heart failure.  Lasix 20 mg daily.  Monitor for any signs of  fluid overload. Check daily weights 11.  Pancytopenia.  Follow-up labs on Monday 12.  Hypertension.  Hydralazine 25 mg every 8 hours.  Monitor with increased mobility 13.  Tobacco and alcohol abuse.  Continue NicoDerm patch.  Provide counseling 14.  Urinary retention.  Urecholine 5 mg 3 times daily.  Check PVR             -oob to void 15.  GERD.  Protonix     Mcarthur RossettiDaniel J Angiulli, PA-C 03/02/2020  I have personally performed a face to face diagnostic evaluation of this patient and formulated the key components of the plan.  Additionally, I have personally reviewed laboratory data, imaging studies, as well as relevant notes and concur with the physician assistant's documentation above.  The patient's status has not changed from the original H&P.  Any changes in documentation from the acute care chart have been noted above.  Ranelle OysterZachary T. Millan Legan, MD, [place] DomFAAPMR

## 2020-03-03 ENCOUNTER — Inpatient Hospital Stay (HOSPITAL_COMMUNITY): Payer: PPO | Admitting: Physical Therapy

## 2020-03-03 ENCOUNTER — Inpatient Hospital Stay (HOSPITAL_COMMUNITY): Payer: PPO

## 2020-03-03 DIAGNOSIS — R5381 Other malaise: Secondary | ICD-10-CM

## 2020-03-03 MED ORDER — BISACODYL 10 MG RE SUPP
10.0000 mg | Freq: Every day | RECTAL | Status: DC
  Administered 2020-03-03 – 2020-03-04 (×2): 10 mg via RECTAL
  Filled 2020-03-03 (×4): qty 1

## 2020-03-03 MED ORDER — TRAMADOL HCL 50 MG PO TABS
50.0000 mg | ORAL_TABLET | Freq: Four times a day (QID) | ORAL | Status: DC
  Administered 2020-03-03 – 2020-03-13 (×38): 50 mg via ORAL
  Filled 2020-03-03 (×39): qty 1

## 2020-03-03 NOTE — Progress Notes (Signed)
PHYSICAL MEDICINE & REHABILITATION PROGRESS NOTE   Subjective/Complaints:  Pt reports pain from "surgerY'- which occurred in 2016- still an issue- can "only get pain meds 4x/day" and suffers when meds wear off ~ 4 hour mark.  Is on home dose of meds.  Had a BM in bed last night- doesn't know when going to go- can't feel needs to go or that is going, and cannot control it.  Since her recent hospital admission.   ROS:  Pt denies SOB, abd pain, CP, N/V/C/D, and vision changes Objective:   No results found. Recent Labs    03/01/20 0259 03/02/20 0313  WBC 8.0 8.9  HGB 8.2* 8.1*  HCT 24.9* 25.2*  PLT 304 323   Recent Labs    03/01/20 0259 03/02/20 0313  NA 137 138  K 5.2* 3.7  CL 99 99  CO2 29 30  GLUCOSE 106* 98  BUN 14 12  CREATININE 0.46 0.42*  CALCIUM 8.7* 8.5*    Intake/Output Summary (Last 24 hours) at 03/03/2020 1018 Last data filed at 03/03/2020 0856 Gross per 24 hour  Intake 400 ml  Output 750 ml  Net -350 ml     Physical Exam: Vital Signs Blood pressure 129/75, pulse 100, temperature 98.5 F (36.9 C), temperature source Oral, resp. rate 18, height 5\' 2"  (1.575 m), weight 51.4 kg, SpO2 93 %.   Physical Exam Constitutional: Pt sitting up in manual w/c, at sink, washing up with OT, NAD HENT: conjugate gaze; thinning hair.  Cardiovascular:RRR Respiratory: taking shallow rapid breaths- but is CTA B/L- good air movement , NT, ND<, (+)BS  Musculoskeletal:  General: No edema.Normal range of motion.  Cervical back: Normal range of motion.  Neurological: Ox3  Motor upper ext 4/5 prox to distal. LE: 3+ to 4-/5 prox to 4+ distally with ADF/PF. No sensory dysfunction. Skin: Skin iswarm.No rashnoted. No erythema.  Psychiatric: Slightly anxious  Assessment/Plan: 1. Functional deficits secondary to debility from acute respiratory failure with hypoxiaextubated 02/13/2020/pulmonary emboli/acute metabolic encephalopathywhich  require 3+ hours per day of interdisciplinary therapy in a comprehensive inpatient rehab setting.  Physiatrist is providing close team supervision and 24 hour management of active medical problems listed below.  Physiatrist and rehab team continue to assess barriers to discharge/monitor patient progress toward functional and medical goals  Care Tool:  Bathing    Body parts bathed by patient: Right arm, Left arm, Chest, Abdomen, Front perineal area, Buttocks, Right upper leg, Left upper leg, Right lower leg, Left lower leg, Face         Bathing assist Assist Level: Minimal Assistance - Patient > 75%     Upper Body Dressing/Undressing Upper body dressing   What is the patient wearing?: Pull over shirt    Upper body assist Assist Level: Set up assist    Lower Body Dressing/Undressing Lower body dressing      What is the patient wearing?: Pants     Lower body assist Assist for lower body dressing: Minimal Assistance - Patient > 75%     Toileting Toileting    Toileting assist Assist for toileting: Minimal Assistance - Patient > 75%     Transfers Chair/bed transfer  Transfers assist     Chair/bed transfer assist level: Minimal Assistance - Patient > 75%     Locomotion Ambulation   Ambulation assist              Walk 10 feet activity   Assist  Walk 50 feet activity   Assist           Walk 150 feet activity   Assist           Walk 10 feet on uneven surface  activity   Assist           Wheelchair     Assist               Wheelchair 50 feet with 2 turns activity    Assist            Wheelchair 150 feet activity     Assist          Blood pressure 129/75, pulse 100, temperature 98.5 F (36.9 C), temperature source Oral, resp. rate 18, height 5\' 2"  (1.575 m), weight 51.4 kg, SpO2 93 %.  Medical Problem List and Plan: 1.Decreased functional mobility with altered mental  statussecondary to acute respiratory failure with hypoxiaextubated 02/13/2020/pulmonary emboli/acute metabolic encephalopathy -patient may shower -ELOS/Goals: 10-12 days, supervision to mod I goals 2. Antithrombotics: -DVT/anticoagulation:Eliquisfor pulmonary emboli -antiplatelet therapy: n/a 3. Pain Management/chroniclow backpain:pt uses her oxycodone 4x daily at home on a schedule. Will schedule her doses at 0700 and 1200 daily, and leave other doses up to her in the PM.  -has spinal cord stimulator per Dr. Rolena Infante, Dr. Nelva Bush manages her chronic pain  4/17- will schedule tramadol 50 mg q6 hours for additional pain control 4. Mood/panic disorder:Xanax 0.5 mg twice daily as needed -pt was anxious about coming to rehab, people she might work with. Reassured her that we will take care and that she will do well. -antipsychotic agents: N/A 5. Neuropsych: This patientiscapable of making decisions on herown behalf. 6. Skin/Wound Care:Routine skin checks 7. Fluids/Electrolytes/Nutrition:Routine in and outs with follow-up chemistries 9. AKI. Resolved. Follow-up chemistries on Monday  4/17- Cr looks good- no dehydration, elevated labs 10. Acute on chronic diastolic congestive heart failure. Lasix 20 mg daily. Monitor for any signs of fluid overload. Check daily weights   Filed Weights   03/02/20 1725 03/03/20 0425  Weight: 51.9 kg 51.4 kg     4/17- weights looking stable so far- con't regimen 11. Pancytopenia. Follow-up labs on Monday 12. Hypertension. Hydralazine 25 mg every 8 hours. Monitor with increased mobility 13. Tobacco and alcohol abuse. Continue NicoDerm patch. Provide counseling 14. Urinary retention. Urecholine 5 mg 3 times daily. Check PVR -oob to void  4/17- nursing notes document foley catheter- will check into this 15. GERD. Protonix 16. Bowel  incontinence  4/17- will order bowel program nightly after dinner- since works best after a meal-with dig stim and suppository. Since pt has NO control/feeling of BMs- not sure why. Incontinent documented.      LOS: 1 days A FACE TO FACE EVALUATION WAS PERFORMED  Maleya Leever 03/03/2020, 10:18 AM

## 2020-03-03 NOTE — Evaluation (Signed)
Occupational Therapy Assessment and Plan  Patient Details  Name: Dawn Beasley MRN: [id number] Date of Birth: 12/26/1950  OT Diagnosis: muscle weakness (generalized) Rehab Potential: Rehab Potential (ACUTE ONLY): Good ELOS: 10-12 days   Today's Date: 03/03/2020 OT Individual Time: 7829-5621 and 3086-5784 OT Individual Time Calculation (min): 105 min and 30 min     Problem List:  Patient Active Problem List   Diagnosis Date Noted  . Sacral decubitus ulcer, stage II ([place]) 03/02/2020  . Acute metabolic encephalopathy 69/62/9528  . Debility 03/02/2020  . Multiple subsegmental pulmonary emboli without acute cor pulmonale ([place])   . Delirium   . Pancytopenia ([place]) 02/12/2020  . Sepsis ([place]) 02/12/2020  . Aspiration pneumonia ([place]) 02/12/2020  . Acute on chronic respiratory failure with hypoxia ([place]) 02/12/2020  . Hyponatremia 02/12/2020  . AKI (acute kidney injury) ([place]) 02/12/2020  . Dehydration 02/12/2020  . Rhabdomyolysis 02/12/2020  . Elevated troponin 02/12/2020  . Respiratory failure ([place]) 02/12/2020  . Abnormal CT scan, colon 02/06/2020  . Nausea and vomiting 02/06/2020  . Essential hypertension   . 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 Encephalopathy acute 12/23/2016  . Transient alteration of awareness 12/23/2016  . Acute hyponatremia   . Speech disturbance   . Benzodiazepine withdrawal with complication ([place])   . Other chronic pain 12/06/2015  . Lumbar spinal stenosis 04/12/2014    Past Medical History:  Past Medical History:  Diagnosis Date  . AAA (abdominal aortic aneurysm) ([place])   . Anxiety   . Arthritis   . Back pain   . Cervical disc herniation   . Depression   . 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 Hypercholesteremia   . Hypertension   . Panic disorder   . Stenosis, spinal, lumbar    Past Surgical History:  Past Surgical History:  Procedure Laterality Date  . APPENDECTOMY    . BACK SURGERY    . CARPAL TUNNEL RELEASE    . LUMBAR  LAMINECTOMY/DECOMPRESSION MICRODISCECTOMY N/A 04/12/2014   Procedure: MICRO LUMBAR DECOMPRESSION LUMBAR THREE TO FOUR,LUMBAR  FOUR TO FIVE;  Surgeon: Johnn Hai, MD;  Location: [facility];  Service: Orthopedics;  Laterality: N/A;  . MAXOFACIAL SURGERY    . SPINAL CORD STIMULATOR INSERTION N/A 12/06/2015   Procedure: LUMBAR SPINAL CORD STIMULATOR INSERTION;  Surgeon: Melina Schools, MD;  Location: [facility];  Service: Orthopedics;  Laterality: N/A;    Assessment & Plan Clinical Impression: Dawn Beasley. [name] is a 69 year old right-handed female with history ofthoracic aortic aneurysm,chronic gastric dysmotility,hyperlipidemia,tobacco/alcoholabuse,hypertension, panic disorder, chronic back pain lumbar spinal cord stimulator insertion 12/06/2015 per Dr. Rolena Infante. Per chart review patient lives with spouse independent prior to admission a bit sedentary. Two-level home bed and bath on main level 2 steps to entry.Husband c an assist as needed. Presented 02/11/2020 after witnessed syncopal event. Patient reportedly had a recent endoscopy colonoscopy on 02/06/2020 with findings of a single localized nonbleeding erosion found in the cecumas well as colitis and reactive gastropathy. No stigmata of recent bleeding was seen. Patient had episode of vomiting was orthostatic received bolus of IV fluids. Noted oxygen saturation 78% on room air started on 4 L.. Admission chemistry with hemoglobin 8.6, WBC 1.3, platelet 141,000, lactic acid 2.3, sodium 127, BUN 50, creatinine 1.69, D-dimer 2.15, troponin high-sensitivity 523, CK 1746, blood cultures no growth to date. Cranial CT scan showed no acute hemorrhage. Small vessel ischemic changes throughout the periventricular white matter left basal ganglia, likely chronic. CT angiogram of the chest showed acute pulmonary embolus within the  segmental branch of the right lower lobe pulmonary artery. Interval development of bilateral pleural effusions left greater than  right. Stable appearance of ascending thoracic aortic aneurysm 4.1 cm. Patient did require intubation for airway protection and extubated 02/13/2020. She was placed on Eliquis after findings of pulmonary emboli. Subsequent echocardiogram did not show any heart strain. She remained in the ICU for acute metabolic encephalopathy/septic shock. She was transferred out of the ICU 02/24/2020. Hospital course complicated by mild acute on chronic diastolic congestive heart failure started on IV Lasix with good diuresis. Patient had been maintained on broad-spectrum antibiotics x10 days and since discontinued. Her renal function has steadily improved after gentle IV fluids latest creatinine 0.42. Pancytopenia hemoglobin 8.1, WBC improved 8900, RBC 2.14. Her diet has been advanced to regular consistency. Her mental status has continued to improve and a follow-up cranial CT scan was completed 02/15/2020 again negative for acute changes suspect acute metabolic encephalopathy. Therapy evaluations completed and patient was admitted for a comprehensive rehab program. Patient transferred to CIR on 03/02/2020 .    Patient currently requires min with basic self-care skills secondary to muscle weakness, decreased cardiorespiratoy endurance and decreased standing balance, decreased postural control and decreased balance strategies.  Prior to hospitalization, patient could complete BADLs with modified independent .  Patient will benefit from skilled intervention to increase independence with basic self-care skills and increase level of independence with iADL prior to discharge home with care partner.  Anticipate patient will require intermittent supervision and no further OT follow recommended.  OT - End of Session Activity Tolerance: Decreased this session Endurance Deficit: Yes Endurance Deficit Description: Frequent rest breaks and cues for deep breathing techniques. OT Assessment Rehab Potential (ACUTE ONLY):  Good OT Patient demonstrates impairments in the following area(s): Balance;Endurance;Motor;Pain;Sensory OT Basic ADL's Functional Problem(s): Bathing;Dressing;Toileting;Grooming OT Advanced ADL's Functional Problem(s): Simple Meal Preparation OT Transfers Functional Problem(s): Tub/Shower OT Additional Impairment(s): None OT Plan OT Intensity: Minimum of 1-2 x/day, 45 to 90 minutes OT Frequency: 5 out of 7 days OT Duration/Estimated Length of Stay: 10-12 days OT Treatment/Interventions: Balance/vestibular training;Discharge planning;Pain management;Self Care/advanced ADL retraining;Therapeutic Activities;UE/LE Coordination activities;Functional mobility training;Patient/family education;Therapeutic Exercise;Visual/perceptual remediation/compensation;Community reintegration;Psychosocial support;UE/LE Strength taining/ROM;Wheelchair propulsion/positioning OT Basic Self-Care Anticipated Outcome(s): Mod I- supervision OT Toileting Anticipated Outcome(s): Mod I OT Bathroom Transfers Anticipated Outcome(s): Mod I OT Recommendation Patient destination: Home Follow Up Recommendations: None Equipment Recommended: Tub/shower bench   Skilled Therapeutic Intervention Session 1: OT evaluation completed. Discussed role of OT, IPR, potential DME, ELOS, and fall risk. Pt was very pleasant throughout the evaluation requiring occasional cues for breathing techniques due to anxiety and decreased endurance. Pt completed stand pivot transfers bed<>w/c and sit<>stand at sink with min A and cues for technique/posture. Pt completed bathing/dressing at sink with overall min A and increase time due to poor functional endurance. Provided education and demonstration with TTB and energy conservation techniques. At end of session, pt left supine in bed with all needs in reach.   Session 2: OT session focused on functional endurance and strengthening required for ADLs and transfers. Pt received in bed reporting increased  fatigue and requested to remain in room. Completed supine<>sit with supervision. OT engaged pt in BUE strengthening exercises using 3# dumbbell to completed bicep curls and chest press 2x 10 reps with rest breaks between each. SpO2 > 92% throughout, however heart rate elevated to 102-105. OT educated on pursed lip breathing exercises. Also completed 2x 20 seated marches with rest breaks between. At end of session, pt left  supine in bed with HOB elevated and all needs in reach.   OT Evaluation Precautions/Restrictions  Precautions Precautions: Fall Restrictions Weight Bearing Restrictions: No General Chart Reviewed: Yes Vital Signs   Pain Pain Assessment Pain Score: 7 (back) Pain Location: Back Home Living/Prior Functioning Home Living Family/patient expects to be discharged to:: Private residence Living Arrangements: Spouse/significant other Available Help at Discharge: Family, Friend(s) Type of Home: House Home Access: Stairs to enter [employer] of Steps: 2 steps from garage entrance with rail on one side (unable to recall side) Home Layout: Two level, Able to live on main level with bedroom/bathroom Bathroom Shower/Tub: Chiropodist: Standard Bathroom Accessibility: Yes  Lives With: Spouse Prior Function Level of Independence: Independent with basic ADLs, Needs assistance with homemaking Vacuuming: Total Driving: Yes ADL   Vision Baseline Vision/History: Wears glasses Wears Glasses: At all times Patient Visual Report: No change from baseline Vision Assessment?: No apparent visual deficits Perception  Perception: Within Functional Limits Praxis Praxis: Intact Cognition Overall Cognitive Status: Within Functional Limits for tasks assessed Arousal/Alertness: Awake/alert Orientation Level: Person;Place;Situation Person: Oriented Place: Oriented Situation: Oriented Year: 2021 Month: April Day of Week: Correct Memory: Appears  intact Immediate Memory Recall: Sock;Blue;Bed Memory Recall Sock: Without Cue Memory Recall Blue: Without Cue Memory Recall Bed: Without Cue Attention: Alternating Awareness: Appears intact Problem Solving: Appears intact Behaviors: Other (comment)(anxiety) Safety/Judgment: Appears intact Sensation Sensation Light Touch: Appears Intact(Reports decreased sensation in BLE, however able to distinguish touch upon gross assessment) Hot/Cold: Appears Intact Proprioception: Appears Intact Coordination Gross Motor Movements are Fluid and Coordinated: No Fine Motor Movements are Fluid and Coordinated: Yes Motor    Mobility  Transfers Sit to Stand: Minimal Assistance - Patient > 75% Stand to Sit: Minimal Assistance - Patient > 75%  Trunk/Postural Assessment  Cervical Assessment Cervical Assessment: Within Functional Limits Thoracic Assessment Thoracic Assessment: Within Functional Limits Lumbar Assessment Lumbar Assessment: Within Functional Limits  Balance Balance Balance Assessed: Yes Static Sitting Balance Static Sitting - Balance Support: Feet supported Static Sitting - Level of Assistance: 7: Independent Dynamic Sitting Balance Dynamic Sitting - Balance Support: During functional activity Dynamic Sitting - Level of Assistance: 6: Modified independent (Device/Increase time) Sitting balance - Comments: donning LB clothing Static Standing Balance Static Standing - Balance Support: Bilateral upper extremity supported Static Standing - Level of Assistance: 5: Stand by assistance Dynamic Standing Balance Dynamic Standing - Balance Support: During functional activity Dynamic Standing - Level of Assistance: 4: Min assist Extremity/Trunk Assessment RUE Assessment RUE Assessment: Within Functional Limits General Strength Comments: 4/5 LUE Assessment LUE Assessment: Within Functional Limits General Strength Comments: 4/5     Refer to Care Plan for Long Term  Goals  Recommendations for other services: None    Discharge Criteria: Patient will be discharged from OT if patient refuses treatment 3 consecutive times without medical reason, if treatment goals not met, if there is a change in medical status, if patient makes no progress towards goals or if patient is discharged from hospital.  The above assessment, treatment plan, treatment alternatives and goals were discussed and mutually agreed upon: by patient  Duayne Cal 03/03/2020, 8:55 AM

## 2020-03-03 NOTE — Evaluation (Signed)
Physical Therapy Assessment and Plan  Patient Details  Name: Dawn Beasley MRN: [id number] Date of Birth: 07-Apr-1951  PT Diagnosis: Abnormal posture, Abnormality of gait, Difficulty walking and Muscle weakness Rehab Potential: Excellent ELOS: 10-12 days   Today's Date: 03/03/2020 PT Individual Time: 1005-1100 PT Individual Time Calculation (min): 55 min    Problem List:  Patient Active Problem List   Diagnosis Date Noted  . Sacral decubitus ulcer, stage II ([place]) 03/02/2020  . Acute metabolic encephalopathy 18/84/1660  . Debility 03/02/2020  . Multiple subsegmental pulmonary emboli without acute cor pulmonale ([place])   . Delirium   . Pancytopenia ([place]) 02/12/2020  . Sepsis ([place]) 02/12/2020  . Aspiration pneumonia ([place]) 02/12/2020  . Acute on chronic respiratory failure with hypoxia ([place]) 02/12/2020  . Hyponatremia 02/12/2020  . AKI (acute kidney injury) ([place]) 02/12/2020  . Dehydration 02/12/2020  . Rhabdomyolysis 02/12/2020  . Elevated troponin 02/12/2020  . Respiratory failure ([place]) 02/12/2020  . Abnormal CT scan, colon 02/06/2020  . Nausea and vomiting 02/06/2020  . Essential hypertension   . 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 Encephalopathy acute 12/23/2016  . Transient alteration of awareness 12/23/2016  . Acute hyponatremia   . Speech disturbance   . Benzodiazepine withdrawal with complication ([place])   . Other chronic pain 12/06/2015  . Lumbar spinal stenosis 04/12/2014    Past Medical History:  Past Medical History:  Diagnosis Date  . AAA (abdominal aortic aneurysm) ([place])   . Anxiety   . Arthritis   . Back pain   . Cervical disc herniation   . Depression   . 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 Hypercholesteremia   . Hypertension   . Panic disorder   . Stenosis, spinal, lumbar    Past Surgical History:  Past Surgical History:  Procedure Laterality Date  . APPENDECTOMY    . BACK SURGERY    . CARPAL TUNNEL RELEASE    . LUMBAR LAMINECTOMY/DECOMPRESSION  MICRODISCECTOMY N/A 04/12/2014   Procedure: MICRO LUMBAR DECOMPRESSION LUMBAR THREE TO FOUR,LUMBAR  FOUR TO FIVE;  Surgeon: Johnn Hai, MD;  Location: [facility];  Service: Orthopedics;  Laterality: N/A;  . MAXOFACIAL SURGERY    . SPINAL CORD STIMULATOR INSERTION N/A 12/06/2015   Procedure: LUMBAR SPINAL CORD STIMULATOR INSERTION;  Surgeon: Melina Schools, MD;  Location: [facility];  Service: Orthopedics;  Laterality: N/A;    Assessment & Plan Clinical Impression: Patient is a 69 y.o. year old right-handed female with history ofthoracic aortic aneurysm,chronic gastric dysmotility,hyperlipidemia,tobacco/alcoholabuse,hypertension, panic disorder, chronic back pain lumbar spinal cord stimulator insertion 12/06/2015 per Dr. Rolena Infante. Per chart review patient lives with spouse independent prior to admission a bit sedentary. Two-level home bed and bath on main level 2 steps to entry.Husband can assist as needed. Presented 02/11/2020 after witnessed syncopal event. Patient reportedly had a recent endoscopy colonoscopy on 02/06/2020 with findings of a single localized nonbleeding erosion found in the cecumas well as colitis and reactive gastropathy. No stigmata of recent bleeding was seen. Patient had episode of vomiting was orthostatic received bolus of IV fluids. Noted oxygen saturation 78% on room air started on 4 L.. Admission chemistry with hemoglobin 8.6, WBC 1.3, platelet 141,000, lactic acid 2.3, sodium 127, BUN 50, creatinine 1.69, D-dimer 2.15, troponin high-sensitivity 523, CK 1746, blood cultures no growth to date. Cranial CT scan showed no acute hemorrhage. Small vessel ischemic changes throughout the periventricular white matter left basal ganglia, likely chronic. CT angiogram of the chest showed acute pulmonary embolus within the segmental branch of  the right lower lobe pulmonary artery. Interval development of bilateral pleural effusions left greater than right. Stable appearance of  ascending thoracic aortic aneurysm 4.1 cm. Patient did require intubation for airway protection and extubated 02/13/2020. She was placed on Eliquis after findings of pulmonary emboli. Subsequent echocardiogram did not show any heart strain. She remained in the ICU for acute metabolic encephalopathy/septic shock. She was transferred out of the ICU 02/24/2020. Hospital course complicated by mild acute on chronic diastolic congestive heart failure started on IV Lasix with good diuresis. Patient had been maintained on broad-spectrum antibiotics x10 days and since discontinued. Her renal function has steadily improved after gentle IV fluids latest creatinine 0.42. Pancytopenia hemoglobin 8.1, WBC improved 8900, RBC 2.14. Her diet has been advanced to regular consistency. Her mental status has continued to improve and a follow-up cranial CT scan was completed 02/15/2020 again negative for acute changes suspect acute metabolic encephalopathy. Therapy evaluations completed and patient was admitted for a comprehensive rehab program. Patient transferred to CIR on 03/02/2020 .   Patient currently requires min with mobility secondary to muscle weakness, decreased cardiorespiratoy endurance, unbalanced muscle activation and decreased standing balance, decreased postural control and decreased balance strategies.  Prior to hospitalization, patient was independent  with mobility and lived with Spouse in a House home.  Home access is 2 steps from garage entrance (typical entrance) with rail on one side (unable to recall side) - 5STE front door with B HRs but typically doesn't enter this way .  Patient will benefit from skilled PT intervention to maximize safe functional mobility, minimize fall risk and decrease caregiver burden for planned discharge home with 24 hour supervision.  Anticipate patient will benefit from follow up OP at discharge.  PT - End of Session Activity Tolerance: Tolerates 30+ min activity with  multiple rests Endurance Deficit: Yes Endurance Deficit Description: Frequent rest breaks and cues for deep breathing techniques. PT Assessment Rehab Potential (ACUTE/IP ONLY): Excellent PT Barriers to Discharge: Inaccessible home environment;Home environment access/layout;Medical stability PT Patient demonstrates impairments in the following area(s): Balance;Behavior;Safety;Endurance;Motor;Nutrition;Pain PT Transfers Functional Problem(s): Bed Mobility;Bed to Chair;Car;Furniture;Floor PT Locomotion Functional Problem(s): Ambulation;Stairs PT Plan PT Intensity: Minimum of 1-2 x/day ,45 to 90 minutes PT Frequency: 5 out of 7 days PT Duration Estimated Length of Stay: 10-12 days PT Treatment/Interventions: Ambulation/gait training;Community reintegration;DME/adaptive equipment instruction;Neuromuscular re-education;Psychosocial support;Stair training;UE/LE Strength taining/ROM;Balance/vestibular training;Discharge planning;Functional electrical stimulation;Pain management;Skin care/wound management;Therapeutic Activities;UE/LE Coordination activities;Cognitive remediation/compensation;Disease management/prevention;Functional mobility training;Patient/family education;Splinting/orthotics;Visual/perceptual remediation/compensation;Therapeutic Exercise PT Transfers Anticipated Outcome(s): mod-I PT Locomotion Anticipated Outcome(s): supervision PT Recommendation Recommendations for Other Services: Therapeutic Recreation consult Therapeutic Recreation Interventions: Outing/community reintergration Follow Up Recommendations: 24 hour supervision/assistance;Outpatient PT Patient destination: Home Equipment Recommended: To be determined  Skilled Therapeutic Intervention Evaluation completed (see details above and below) with education on PT POC and goals and individual treatment initiated with focus on activity tolerance, bed mobility, functional transfers, gait training, stair navigation, and education  regarding daily therapy schedule, weekly team meetings, purpose of PT evaluation, and other CIR information. Pt received supine in bed and agreeable to therapy session. Pt noted to have increased breath sounds during session (even at rest - may be associated with anxiety as well) - SpO2 monitored throughout and maintained >90% except for after ambulating up/down ramp it decreased to 85% but recovered to 91% in 30 seconds. Supine>sitting L EOB, not using bed features, with CGA for trunk control. R stand pivot to w/c with min assist for balance and using UE support on armrests as needed.  Transported  to/from gym in w/c for time management and energy conservation. Ambulatory simulated car transfer, R HHA, with min assist for balance and pt side-stepping into the car. Ambulated ~52f up/down ramp using R HHA with min assist for balance - pt noted to have increased respiratory rate and breath sounds with SpO2 decreasing to 85% but recovering back to 91% on RA in 30 seconds- RN notified. Gait training 484f 626fseated break between) with R HHA and min assist for balance - demonstrates slow, guarded gait pattern with decreased gait speed - SpO2 93% after each walk and HR elevated up to 123bpm. Ascended/descended 4 steps using R HR only per home set-up with mod assist for balance - self-selected reciprocal pattern on ascent and step-to pattern leading with L LE on descent. Pt reports increasing fatigue and requesting to return to bed and rest - therapist educated pt on importance of upright, OOB activity tolerance and pt agreeable to return to w/c for lunch. Transported back to room. Stand pivot to EOB, no AD, with min assist for balance. Sit>supine with supervision. Pt left supine in bed with needs in reach and bed alarm on.   PT Evaluation Precautions/Restrictions Precautions Precautions: Fall Precaution Comments: watch HR and SpO2 Restrictions Weight Bearing Restrictions: No Pain Pain Assessment Pain Scale:  0-10 Pain Score: 0-No pain At beginning of session denies pain; however, does reports some LBP with ambulation - provided seated rest breaks for pain management. Home Living/Prior Functioning Home Living Available Help at Discharge: Family;Friend(s)(reports husband works but can work from home; best friend (PaJeannene Patellaan provide as much support as needed) Type of Home: House Home Access: Stairs to enter [employer] Steps: 2 steps from garage entrance (typical entrance) with rail on one side (unable to recall side) - 5STE front door with B HRs but typically doesn't enter this way Entrance Stairs-Rails: (rail on 1 side) Home Layout: Two level;Able to live on main level with bedroom/bathroom Alternate Level Stairs-Number of Steps: flight Bathroom Shower/Tub: TubChiropodisttandard Bathroom Accessibility: Yes  Lives With: Spouse Prior Function Level of Independence: Independent with gait;Other (comment);Independent with transfers;Independent with homemaking with ambulation(reports needs help with heavier houshold tasks due to lumbar spine surgery) Vacuuming: Total  Able to Take Stairs?: Yes Driving: Yes Vocation: Other (Comment) Vocation Requirements: repors she "spaces out" her day and performs mosts tasks early in the morning before she gets tired Comments: enjoys being with [employer], [employer] brokerrception: Within Functional Limits Praxis Praxis: Intact  Cognition Overall Cognitive Status: Within Functional Limits for tasks assessed Arousal/Alertness: Awake/alert Orientation Level: Oriented X4(reports having some difficulty remembering events of hospital stay but states feels her memory is baseline) Attention: Focused;Sustained Focused Attention: Appears intact Sustained Attention: Appears intact Awareness: Appears intact Problem Solving: Appears intact Behaviors: Other (comment)(anxiety) Safety/Judgment: Appears  intact Sensation Sensation Light Touch: Appears Intact Peripheral sensation comments: intact on light touch screen but reports she can't feel the floor when placing her L foot on the ground Hot/Cold: Not tested Proprioception: Appears Intact Coordination Gross Motor Movements are Fluid and Coordinated: No Coordination and Movement Description: impaired due to balance impairments Motor  Motor Motor: Other (comment) Motor - Skilled Clinical Observations: generalized weakness and impaired balance  Mobility Bed Mobility Bed Mobility: Supine to Sit;Sit to Supine Supine to Sit: Contact Guard/Touching assist Sit to Supine: Supervision/Verbal cueing Transfers Transfers: Sit to Stand;Stand to Sit;Stand Pivot Transfers Sit to Stand: Minimal Assistance - Patient > 75% Stand to Sit: Minimal Assistance -  Patient > 75% Stand Pivot Transfers: Minimal Assistance - Patient > 75% Stand Pivot Transfer Details: Verbal cues for technique;Visual cues/gestures for sequencing;Visual cues/gestures for precautions/safety;Tactile cues for posture;Tactile cues for sequencing Transfer (Assistive device): 1 person hand held assist Locomotion  Gait Ambulation: Yes Gait Assistance: Minimal Assistance - Patient > 75% Gait Distance (Feet): 62 Feet Assistive device: 1 person hand held assist Gait Assistance Details: Tactile cues for sequencing;Tactile cues for posture;Tactile cues for placement;Verbal cues for sequencing;Verbal cues for technique;Verbal cues for gait pattern;Verbal cues for precautions/safety Gait Gait: Yes Gait Pattern: Impaired Gait Pattern: Decreased step length - left;Decreased step length - right;Decreased stride length Gait velocity: decreased Stairs / Additional Locomotion Stairs: Yes Stairs Assistance: Moderate Assistance - Patient 50 - 74% Stair Management Technique: One rail Right Number of Stairs: 4 Height of Stairs: 6 Ramp: Minimal Assistance - Patient >75% Curb: Maximal  Assistance - Patient 25 - 49% Wheelchair Mobility Wheelchair Mobility: No  Trunk/Postural Assessment  Cervical Assessment Cervical Assessment: Exceptions to WFL(forward head) Thoracic Assessment Thoracic Assessment: Exceptions to WFL(thoracic rounding) Lumbar Assessment Lumbar Assessment: Exceptions to WFL(posterior pelvic tilt in sitting) Postural Control Postural Control: Deficits on evaluation Righting Reactions: delayed and inadequate Protective Responses: delayed and inadequate Postural Limitations: decreased  Balance Balance Balance Assessed: Yes Static Sitting Balance Static Sitting - Level of Assistance: 5: Stand by assistance Dynamic Sitting Balance Dynamic Sitting - Level of Assistance: 5: Stand by assistance Static Standing Balance Static Standing - Balance Support: Right upper extremity supported Static Standing - Level of Assistance: 4: Min assist Dynamic Standing Balance Dynamic Standing - Balance Support: During functional activity;Right upper extremity supported Dynamic Standing - Level of Assistance: 3: Mod assist Extremity Assessment      RLE Assessment RLE Assessment: Exceptions to [facility] Active Range of Motion (AROM) Comments: WFL RLE Strength Right Hip Flexion: 3+/5 Right Knee Flexion: 4-/5 Right Knee Extension: 4/5 Right Ankle Dorsiflexion: 4/5 Right Ankle Plantar Flexion: 4/5 LLE Assessment LLE Assessment: Exceptions to [facility] Active Range of Motion (AROM) Comments: WFL LLE Strength Left Hip Flexion: 4/5 Left Knee Flexion: 4/5 Left Knee Extension: 4-/5 Left Ankle Dorsiflexion: 4-/5 Left Ankle Plantar Flexion: 4-/5    Refer to Care Plan for Long Term Goals  Recommendations for other services: Therapeutic Recreation  Outing/community reintegration  Discharge Criteria: Patient will be discharged from PT if patient refuses treatment 3 consecutive times without medical reason, if treatment goals not met, if there is a change in medical status, if  patient makes no progress towards goals or if patient is discharged from hospital.  The above assessment, treatment plan, treatment alternatives and goals were discussed and mutually agreed upon: by patient  Tawana Scale, PT, DPT 03/03/2020, 7:49 AM

## 2020-03-03 NOTE — Progress Notes (Signed)
Second dig stim completed. Rectal vault empty. No BM.

## 2020-03-04 DIAGNOSIS — R5381 Other malaise: Secondary | ICD-10-CM | POA: Diagnosis not present

## 2020-03-04 MED ORDER — WHITE PETROLATUM EX OINT
TOPICAL_OINTMENT | CUTANEOUS | Status: AC
  Filled 2020-03-04: qty 28.35

## 2020-03-04 MED ORDER — TRAZODONE HCL 50 MG PO TABS
50.0000 mg | ORAL_TABLET | Freq: Every day | ORAL | Status: DC
  Administered 2020-03-04 – 2020-03-12 (×9): 50 mg via ORAL
  Filled 2020-03-04 (×9): qty 1

## 2020-03-04 NOTE — Progress Notes (Signed)
Bowel program started at 1811 with dig stim and dulcolax suppository. Pt assisted on to [facility] at at 1835, pt had mall amount of soft mushy brown bowl movement. Dig stim done again, but no more stool. Will report off to oncoming staff.   Marylu Lund, RN

## 2020-03-04 NOTE — Progress Notes (Signed)
[place] PHYSICAL MEDICINE & REHABILITATION PROGRESS NOTE   Subjective/Complaints:  Pt received bowel program last night- no results, however had BM at 10pm and 11:30 pm  Also foley kept clogging up and "quit working" they put in new one.   Stomach less distended after they did that.  Tramadol- helpful to get through to next pain medicine that's due.  Otherwise, doing OK. .    ROS:  Pt denies SOB, abd pain, CP, N/V/C/D, and vision changes   Objective:   No results found. Recent Labs    03/02/20 0313  WBC 8.9  HGB 8.1*  HCT 25.2*  PLT 323   Recent Labs    03/02/20 0313  NA 138  K 3.7  CL 99  CO2 30  GLUCOSE 98  BUN 12  CREATININE 0.42*  CALCIUM 8.5*    Intake/Output Summary (Last 24 hours) at 03/04/2020 0959 Last data filed at 03/04/2020 0803 Gross per 24 hour  Intake 622 ml  Output 1425 ml  Net -803 ml     Physical Exam: Vital Signs Blood pressure (!) 143/76, pulse (!) 110, temperature 98.5 F (36.9 C), temperature source Oral, resp. rate 20, height 5\' 2"  (1.575 m), weight 51.6 kg, SpO2 91 %.   Physical Exam Constitutional: Pt laying in bed; appears comfortable, supine, NAD HENT: conjugate gaze; thinning hair.  Cardiovascular:RRR Respiratory: no shallow breaths today; CTA b/L YN:WGNF, NT, ND, (+)BS Musculoskeletal:  General: No edema.Normal range of motion.  Cervical back: Normal range of motion.  Neurological: Ox3  Motor upper ext 4/5 prox to distal. LE: 3+ to 4-/5 prox to 4+ distally with ADF/PF. No sensory dysfunction. Skin: Skin iswarm.No rashnoted. No erythema.  Psychiatric: Less anxious  Assessment/Plan: 1. Functional deficits secondary to debility from acute respiratory failure with hypoxiaextubated 02/13/2020/pulmonary emboli/acute metabolic encephalopathywhich require 3+ hours per day of interdisciplinary therapy in a comprehensive inpatient rehab setting.  Physiatrist is providing close team supervision and 24 hour  management of active medical problems listed below.  Physiatrist and rehab team continue to assess barriers to discharge/monitor patient progress toward functional and medical goals  Care Tool:  Bathing    Body parts bathed by patient: Right arm, Left arm, Chest, Abdomen, Front perineal area, Buttocks, Right upper leg, Left upper leg, Right lower leg, Left lower leg, Face         Bathing assist Assist Level: Minimal Assistance - Patient > 75%     Upper Body Dressing/Undressing Upper body dressing   What is the patient wearing?: Pull over shirt    Upper body assist Assist Level: Set up assist    Lower Body Dressing/Undressing Lower body dressing      What is the patient wearing?: Pants     Lower body assist Assist for lower body dressing: Moderate Assistance - Patient 50 - 74%     Toileting Toileting    Toileting assist Assist for toileting: Moderate Assistance - Patient 50 - 74%     Transfers Chair/bed transfer  Transfers assist     Chair/bed transfer assist level: Minimal Assistance - Patient > 75%     Locomotion Ambulation   Ambulation assist      Assist level: Minimal Assistance - Patient > 75% Assistive device: Hand held assist Max distance: 28ft   Walk 10 feet activity   Assist     Assist level: Minimal Assistance - Patient > 75% Assistive device: Hand held assist   Walk 50 feet activity   Assist    Assist  level: Minimal Assistance - Patient > 75% Assistive device: Hand held assist    Walk 150 feet activity   Assist Walk 150 feet activity did not occur: Safety/medical concerns         Walk 10 feet on uneven surface  activity   Assist     Assist level: Minimal Assistance - Patient > 75% Assistive device: Hand held assist   Wheelchair     Assist Will patient use wheelchair at discharge?: No             Wheelchair 50 feet with 2 turns activity    Assist            Wheelchair 150 feet activity      Assist          Blood pressure (!) 143/76, pulse (!) 110, temperature 98.5 F (36.9 C), temperature source Oral, resp. rate 20, height 5\' 2"  (1.575 m), weight 51.6 kg, SpO2 91 %.  Medical Problem List and Plan: 1.Decreased functional mobility with altered mental statussecondary to acute respiratory failure with hypoxiaextubated 02/13/2020/pulmonary emboli/acute metabolic encephalopathy -patient may shower -ELOS/Goals: 10-12 days, supervision to mod I goals 2. Antithrombotics: -DVT/anticoagulation:Eliquisfor pulmonary emboli -antiplatelet therapy: n/a 3. Pain Management/chroniclow backpain:pt uses her oxycodone 4x daily at home on a schedule. Will schedule her doses at 0700 and 1200 daily, and leave other doses up to her in the PM.  -has spinal cord stimulator per Dr. 02/15/2020, Dr. Shon Baton manages her chronic pain  4/17- will schedule tramadol 50 mg q6 hours for additional pain control  4/18- is working a little better for her- explained I cannot insist pain doctor do tramadol, but can con't here.  4. Mood/panic disorder:Xanax 0.5 mg twice daily as needed -pt was anxious about coming to rehab, people she might work with. Reassured her that we will take care and that she will do well.  4/18- feeling better about rehab -antipsychotic agents: N/A 5. Neuropsych: This patientiscapable of making decisions on herown behalf. 6. Skin/Wound Care:Routine skin checks 7. Fluids/Electrolytes/Nutrition:Routine in and outs with follow-up chemistries 9. AKI. Resolved. Follow-up chemistries on Monday  4/17- Cr looks good- no dehydration, elevated labs 10. Acute on chronic diastolic congestive heart failure. Lasix 20 mg daily. Monitor for any signs of fluid overload. Check daily weights   Filed Weights   03/02/20 1725 03/03/20 0425 03/04/20 0500  Weight: 51.9 kg 51.4 kg 51.6 kg     4/18- weights stable-  con't regimen 11. Pancytopenia. Follow-up labs on Monday 12. Hypertension. Hydralazine 25 mg every 8 hours. Monitor with increased mobility 13. Tobacco and alcohol abuse. Continue NicoDerm patch. Provide counseling 14. Urinary retention. Urecholine 5 mg 3 times daily. Check PVR -oob to void  4/17- nursing notes document foley catheter- will check into this  4/18- replaced foley catheter this AM- hers wasn't working- will see if can remove in next few days.  15. GERD. Protonix 16. Bowel incontinence  4/17- will order bowel program nightly after dinner- since works best after a meal-with dig stim and suppository. Since pt has NO control/feeling of BMs- not sure why. Incontinent documented.    4/18- pt had 2 BMs after bowel program last night- 10pm and 11:30 pm-   -not sure WHY having loss of bowel sensation/control- will d/w team.    LOS: 2 days A FACE TO FACE EVALUATION WAS PERFORMED  Deshan Hemmelgarn 03/04/2020, 9:59 AM

## 2020-03-04 NOTE — Progress Notes (Signed)
Dig stim performed at 1945, no stool palpated in rectum. Pt assisted to bedside commode, small bowel movement noted.

## 2020-03-05 ENCOUNTER — Inpatient Hospital Stay (HOSPITAL_COMMUNITY): Payer: PPO

## 2020-03-05 ENCOUNTER — Inpatient Hospital Stay (HOSPITAL_COMMUNITY): Payer: PPO | Admitting: Occupational Therapy

## 2020-03-05 LAB — CBC WITH DIFFERENTIAL/PLATELET
Abs Immature Granulocytes: 0.04 10*3/uL (ref 0.00–0.07)
Basophils Absolute: 0 10*3/uL (ref 0.0–0.1)
Basophils Relative: 0 %
Eosinophils Absolute: 0.3 10*3/uL (ref 0.0–0.5)
Eosinophils Relative: 3 %
HCT: 24 % — ABNORMAL LOW (ref 36.0–46.0)
Hemoglobin: 7.9 g/dL — ABNORMAL LOW (ref 12.0–15.0)
Immature Granulocytes: 0 %
Lymphocytes Relative: 9 %
Lymphs Abs: 0.9 10*3/uL (ref 0.7–4.0)
MCH: 37.6 pg — ABNORMAL HIGH (ref 26.0–34.0)
MCHC: 32.9 g/dL (ref 30.0–36.0)
MCV: 114.3 fL — ABNORMAL HIGH (ref 80.0–100.0)
Monocytes Absolute: 0.8 10*3/uL (ref 0.1–1.0)
Monocytes Relative: 8 %
Neutro Abs: 7.5 10*3/uL (ref 1.7–7.7)
Neutrophils Relative %: 80 %
Platelets: 311 10*3/uL (ref 150–400)
RBC: 2.1 MIL/uL — ABNORMAL LOW (ref 3.87–5.11)
RDW: 13.1 % (ref 11.5–15.5)
WBC: 9.4 10*3/uL (ref 4.0–10.5)
nRBC: 0 % (ref 0.0–0.2)

## 2020-03-05 LAB — COMPREHENSIVE METABOLIC PANEL
ALT: 36 U/L (ref 0–44)
AST: 27 U/L (ref 15–41)
Albumin: 2.3 g/dL — ABNORMAL LOW (ref 3.5–5.0)
Alkaline Phosphatase: 60 U/L (ref 38–126)
Anion gap: 12 (ref 5–15)
BUN: 10 mg/dL (ref 8–23)
CO2: 27 mmol/L (ref 22–32)
Calcium: 8.7 mg/dL — ABNORMAL LOW (ref 8.9–10.3)
Chloride: 95 mmol/L — ABNORMAL LOW (ref 98–111)
Creatinine, Ser: 0.44 mg/dL (ref 0.44–1.00)
GFR calc Af Amer: >60 mL/min (ref >60)
GFR calc non Af Amer: >60 mL/min (ref >60)
Glucose, Bld: 89 mg/dL (ref 70–99)
Potassium: 3.4 mmol/L — ABNORMAL LOW (ref 3.5–5.1)
Sodium: 134 mmol/L — ABNORMAL LOW (ref 135–145)
Total Bilirubin: 0.2 mg/dL — ABNORMAL LOW (ref 0.3–1.2)
Total Protein: 5.9 g/dL — ABNORMAL LOW (ref 6.5–8.1)

## 2020-03-05 MED ORDER — POTASSIUM CHLORIDE 20 MEQ PO PACK
40.0000 meq | PACK | Freq: Once | ORAL | Status: DC
  Filled 2020-03-05: qty 2

## 2020-03-05 MED ORDER — POTASSIUM CHLORIDE CRYS ER 20 MEQ PO TBCR
40.0000 meq | EXTENDED_RELEASE_TABLET | Freq: Once | ORAL | Status: AC
  Administered 2020-03-05: 40 meq via ORAL
  Filled 2020-03-05: qty 2

## 2020-03-05 NOTE — Care Management (Signed)
[facility] Individual Statement of Services  Patient Name:  Dawn Beasley  Date:  03/05/2020  Welcome to the [facility].  Our goal is to provide you with an individualized program based on your diagnosis and situation, designed to meet your specific needs.  With this comprehensive rehabilitation program, you will be expected to participate in at least 3 hours of rehabilitation therapies Monday-Friday, with modified therapy programming on the weekends.  Your rehabilitation program will include the following services:  Physical Therapy (PT), Occupational Therapy (OT), 24 hour per day rehabilitation nursing, Therapeutic Recreaction (TR), Psychology, Neuropsychology, Case Management (Social Worker), Rehabilitation Medicine, Nutrition Services, Pharmacy Services and Other  Weekly team conferences will be held on Tuesdays to discuss your progress.  Your Social Worker will talk with you frequently to get your input and to update you on team discussions.  Team conferences with you and your family in attendance may also be held.  Expected length of stay: 10-12 days   Overall anticipated outcome: Independent with Assistive Device  Depending on your progress and recovery, your program may change. Your Social Worker will coordinate services and will keep you informed of any changes. Your Social Worker's name and contact numbers are listed  below.  The following services may also be recommended but are not provided by the [facility]:   Driving Evaluations  Home Health Rehabiltiation Services  Outpatient Rehabilitation Services  Vocational Rehabilitation   Arrangements will be made to provide these services after discharge if needed.  Arrangements include referral to agencies that provide these services.  Your insurance has been verified to be:  [employer]  Your primary doctor is:  Lonie Peak  Pertinent information will  be shared with your doctor and your insurance company.  Social Worker:  Cecile Sheerer, SW [phone] or ([phone]  Information discussed with and copy given to patient by: Gretchen Short, 03/05/2020, 11:35 AM

## 2020-03-05 NOTE — Progress Notes (Signed)
Pt in bowel program and she refused getting a suppository. Pt has had more than 2 bowel movements today and said "I have had large bowel movements, I don't feel like I need the suppository and can do without it tonight."

## 2020-03-05 NOTE — Plan of Care (Signed)
  Problem: Consults Goal: RH GENERAL PATIENT EDUCATION Description: See Patient Education module for education specifics. Outcome: Progressing Goal: Skin Care Protocol Initiated - if Braden Score 18 or less Description: If consults are not indicated, leave blank or document N/A Outcome: Progressing   Problem: RH BOWEL ELIMINATION Goal: RH STG MANAGE BOWEL WITH ASSISTANCE Description: STG Manage Bowel with min Assistance. Outcome: Progressing Goal: RH STG MANAGE BOWEL W/MEDICATION W/ASSISTANCE Description: STG Manage Bowel with Medication with min Assistance. Outcome: Progressing   Problem: RH BLADDER ELIMINATION Goal: RH STG MANAGE BLADDER WITH ASSISTANCE Description: STG Manage Bladder With min Assistance Outcome: Progressing Goal: RH STG MANAGE BLADDER WITH MEDICATION WITH ASSISTANCE Description: STG Manage Bladder With Medication With min Assistance. Outcome: Progressing   Problem: RH SKIN INTEGRITY Goal: RH STG MAINTAIN SKIN INTEGRITY WITH ASSISTANCE Description: STG Maintain Skin Integrity With Supervision Assistance. Outcome: Progressing Goal: RH STG ABLE TO PERFORM INCISION/WOUND CARE W/ASSISTANCE Description: STG Able To Perform Incision/Wound Care With supervision Assistance. Outcome: Progressing   Problem: RH SAFETY Goal: RH STG ADHERE TO SAFETY PRECAUTIONS W/ASSISTANCE/DEVICE Description: STG Adhere to Safety Precautions With min Assistance appropriate assistive Device. Outcome: Progressing   Problem: RH PAIN MANAGEMENT Goal: RH STG PAIN MANAGED AT OR BELOW PT'S PAIN GOAL Description: <4 on a 0-10 pain scale  Outcome: Progressing   Problem: RH KNOWLEDGE DEFICIT GENERAL Goal: RH STG INCREASE KNOWLEDGE OF SELF CARE AFTER HOSPITALIZATION Description: Patient will be able to demonstrate knowledge of care including but not limited to medication management, dietary management, skin care, and follow up care with the MD post discharge with min assist from CIR  staff. Outcome: Progressing   

## 2020-03-05 NOTE — Progress Notes (Signed)
Social Work Assessment and Plan   Patient Details  Name: Dawn Beasley MRN: [id number] Date of Birth: Apr 06, 1951  Today's Date: 03/05/2020  Problem List:  Patient Active Problem List   Diagnosis Date Noted  . Sacral decubitus ulcer, stage II ([place]) 03/02/2020  . Acute metabolic encephalopathy 84/13/2440  . Debility 03/02/2020  . Multiple subsegmental pulmonary emboli without acute cor pulmonale ([place])   . Delirium   . Pancytopenia ([place]) 02/12/2020  . Sepsis ([place]) 02/12/2020  . Aspiration pneumonia ([place]) 02/12/2020  . Acute on chronic respiratory failure with hypoxia ([place]) 02/12/2020  . Hyponatremia 02/12/2020  . AKI (acute kidney injury) ([place]) 02/12/2020  . Dehydration 02/12/2020  . Rhabdomyolysis 02/12/2020  . Elevated troponin 02/12/2020  . Respiratory failure ([place]) 02/12/2020  . Abnormal CT scan, colon 02/06/2020  . Nausea and vomiting 02/06/2020  . Essential hypertension   . 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 Encephalopathy acute 12/23/2016  . Transient alteration of awareness 12/23/2016  . Acute hyponatremia   . Speech disturbance   . Benzodiazepine withdrawal with complication ([place])   . Other chronic pain 12/06/2015  . Lumbar spinal stenosis 04/12/2014   Past Medical History:  Past Medical History:  Diagnosis Date  . AAA (abdominal aortic aneurysm) ([place])   . Anxiety   . Arthritis   . Back pain   . Cervical disc herniation   . Depression   . 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 Hypercholesteremia   . Hypertension   . Panic disorder   . Stenosis, spinal, lumbar    Past Surgical History:  Past Surgical History:  Procedure Laterality Date  . APPENDECTOMY    . BACK SURGERY    . CARPAL TUNNEL RELEASE    . LUMBAR LAMINECTOMY/DECOMPRESSION MICRODISCECTOMY N/A 04/12/2014   Procedure: MICRO LUMBAR DECOMPRESSION LUMBAR THREE TO FOUR,LUMBAR  FOUR TO FIVE;  Surgeon: Johnn Hai, MD;  Location: [facility];  Service: Orthopedics;  Laterality: N/A;  . MAXOFACIAL SURGERY     . SPINAL CORD STIMULATOR INSERTION N/A 12/06/2015   Procedure: LUMBAR SPINAL CORD STIMULATOR INSERTION;  Surgeon: Melina Schools, MD;  Location: [facility];  Service: Orthopedics;  Laterality: N/A;   Social History:  reports that she quit smoking about 3 weeks ago. She has a 20.00 pack-year smoking history. She has never used smokeless tobacco. She reports current alcohol use. She reports current drug use. Drug: Oxycodone.  Family / Support Systems Marital Status: Married How Long?: 9 years Patient Roles: Spouse, Parent Spouse/Significant Other: Jphn [phone] Children: 2 adult sons (one child live [place]; Abagail Kitchens lives in [place]) Other Supports: Pt states she has a friend that can stop by PRN Anticipated Caregiver: Intermittent support Ability/Limitations of Caregiver: Husband works Building control surveyor Availability: Intermittent Family Dynamics: Pt lives with her husband; pt retired in 2017 after back surgery. They have two adult sons.  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 Education: Nursing degree Read: Yes Write: Yes Employment Status: Retired Date Retired/Disabled/Unemployed: 2017 Age Retired: 65 Public relations account executive Issues: Denies Guardian/Conservator: N/A   Abuse/Neglect Abuse/Neglect Assessment Can Be Completed: Yes Physical Abuse: Denies Verbal Abuse: Denies Sexual Abuse: Denies Exploitation of patient/patient's resources: Denies Self-Neglect: Denies  Emotional Status Pt's affect, behavior and adjustment status: Pt in good spirits Recent Psychosocial Issues: Xanax for anxiety prescribed by PCP Psychiatric  History: Denies Substance Abuse History: Denies; admits she has quit smoking cigarettes since hospitalization and currently on the  patch  Patient / Family Perceptions, Expectations & Goals Pt/Family understanding of illness & functional limitations: Pt has a general understanding of her condition. Premorbid pt/family roles/activities: Independent Anticipated changes in roles/activities/participation: assistance with ADLs/IADLs Pt/family expectations/goals: Pt goal "getting to feeling feet again, walking, and going home."  [employer]: None Premorbid Home Care/DME Agencies: None Transportation available at discharge: Husband to transport home  Discharge Planning Living Arrangements: Spouse/significant other [place]: Spouse/significant other Type of Residence: Private residence Insurance Resources: Multimedia programmer (specify)([employer]) Financial Resources: SSI Financial Screen Referred: No Living Expenses: Own Money Management: Patient, Spouse Does the patient have any problems obtaining your medications?: No [place] Barriers to Discharge: Decreased caregiver support, Lack of/limited family support Social Work Anticipated Follow Up Needs: HH/OP, SNF Expected length of stay: 10-12 days  Clinical Impression SW met with pt in room at bedside to introduce self, explain role, and discuss discharge process. Pt is not veteran (neither is spouse). No DME. No jail/prison hx.   Physical address: [address] [place], [address]  Dawn Beasley 03/05/2020, 11:34 AM

## 2020-03-05 NOTE — Progress Notes (Signed)
Inpatient Rehabilitation  Patient information reviewed and entered into eRehab system by Happy Begeman M. Marcel Gary, M.A., CCC/SLP, PPS Coordinator.  Information including medical coding, functional ability and quality indicators will be reviewed and updated through discharge.    

## 2020-03-05 NOTE — Progress Notes (Signed)
Occupational Therapy Session Note  Patient Details  Name: Dawn Beasley MRN: [id number] Date of Birth: [birth year]  Today's Date: 03/05/2020 OT Individual Time: 1100-1158 OT Individual Time Calculation (min): 58 min    Short Term Goals: Week 1:  OT Short Term Goal 1 (Week 1): Patient will complete LB dressing with supervision. OT Short Term Goal 2 (Week 1): Patient will complete toilet transfers with supervision and min cues for technique. OT Short Term Goal 3 (Week 1): Patient will engage in functional activity in standing for 3 minutes without rest break to increase functional endurance for ADLs.  Skilled Therapeutic Interventions/Progress Updates:    Patient in bed, alert and ready for therapy session.  Pain at back within her tolerance at this time.  Supine to sitting edge of bed with CS.  Able to donn slipper socks with set up.   SPT bed to w/c with CGA.  Sponge bath completed w/c level with set up, CS in stance.  UB dressing with set up.  LB dressing with mod A due to need to thread cathter through pants.  Grooming tasks mod I seated.  Reviewed DME in bathroom setting - she plans to have her husband measure dimensions of garden tub.  She will not need DME for toilet.  Completed light UB exercises.  Ambulation with rollator w/c to bed with CGA.  Sitting to supine with CS.  She remained in bed at close of session with bed alarm on and call bell/tray table in reach.    Therapy Documentation Precautions:  Precautions Precautions: Fall Precaution Comments: watch HR and SpO2 Restrictions Weight Bearing Restrictions: No   Therapy/Group: Individual Therapy  Barrie Lyme 03/05/2020, 7:46 AM

## 2020-03-05 NOTE — Progress Notes (Signed)
Physical Therapy Session Note  Patient Details  Name: Dawn Beasley MRN: [id number] Date of Birth: June 09, 1951  Today's Date: 03/05/2020 PT Individual Time: 0910-1007 and  1420-1515 PT Individual Time Calculation (min): 57 min and 55 min   Short Term Goals: Week 1:  PT Short Term Goal 1 (Week 1): Pt will perform sit<>stands with CGA PT Short Term Goal 2 (Week 1): Pt will perform stand pivot transfers with CGA PT Short Term Goal 3 (Week 1): Pt will ambulate at least 119ft using LRAD with CGA PT Short Term Goal 4 (Week 1): Pt will ascend/descend 4 steps using 1HR and min assist  Skilled Therapeutic Interventions/Progress Updates:     Session 1: Patient in bed on RA upon PT arrival. Patient alert and agreeable to PT session. Patient reported 7/10 low back pain during session, RN made aware. PT provided repositioning, rest breaks, and distraction as pain interventions throughout session. Patient also reported that he fee "feel numb" since admission. Performed sensation testing, patient able to identify light touch and pressure, however reports that her full foot and lateral ankles have diminished sensation compared to the rest of her LEs. She denies any tingling and states that she has never felt this before. Patient also expressed anxiety about therapy and her recovery, stated that she wants to be independent at home. PT discussed POC and PT goals, patient reported feeling motivated to precede with therapy after.   Therapeutic Activity: Bed Mobility: Patient performed supine to/from sit with supervision in a flat bed without use of bed rails. Provided verbal cues for performing log rolling to reduce back pain with activity. Transfers: Patient performed sit to/from stand x2 without an AD and x3 using a rollator with CGA for safety/balance. Provided verbal cues for hand placement and use of breaks, with demonstration, on rollator.  Gait Training:  Patient ambulated 47 feet (SPO2 94%, HR 111) with  HHA with min A and 97 feet (RPE 5/10 SPO2 94%, HR 124) and 144 feet (RPE 6/10, SPO2 95%, HR 112) using a rollator with CGA. Ambulated with decreased gait speed, decreased step length and height, increased B hip and knee flexion in stance, forward trunk lean, and downward head gaze. Provided verbal cues for erect posture, increased step height, paced breathing. Provided seated rest breaks between trials due to SOB and increased work of breathing. Provided cues for pursed lip breathing and diaphragmatic breathing between trials.  Patient in bed at end of session with breaks locked, bed alarm set, and all needs within reach. Patient remained on RA throughout session with SPO2 >90%.  Session 2: Patient in bed upon PT arrival. Patient alert and agreeable to PT session. Patient reported 7-8/10 low back pain during session, RN made aware. PT provided repositioning, rest breaks, and distraction as pain interventions throughout session.   Therapeutic Activity: Bed Mobility: Patient performed supine to/from sit as above and on a mat table with blue wedge for comfort with supervision. Transfers: Patient performed sit to/from stand x2 using a rollator. Provided verbal cues for hand placement and use of breaks.  Gait Training:  Patient ambulated 87 feet using a rollator and 10 feet without an AD with CGA-close supervision. Ambulated as above.   Neuromuscular Re-ed: Patient performed the following functional balance activities: -sit to stand x5 with single UE support for LE strengthening and balance with a functional activity -static standing x2 min, noted increased work of breathing and pain with activity, required distant supervision for balance -standing eyes closed x10 sec  with supervision for safety -standing feet together x1 min with supervision  -standing turning to each side x1 with spervision and increased time due to back pain L>R -turning 360 degrees to the L Patient with increased back pain with  standing activities, assisted patient to hook-lying on a mat table with blue wedge and pillow for comfort -performed abdominal drawing-in maneuver 3x10 with 3-5 sec hold, patient reported improved pain, 6/10 after, educated patient on performing this activity in the bed between therapy sessions  Patient with increased pain and fatigue during session limiting participation. Discussed pain management strategies with patient and RN, will continue to work on nonpharmacologic pain management during therapy sessions.   Patient in bed at end of session with breaks locked, bed alarm set, and all needs within reach. Patient missed 20 min of skilled PT due to pain and fatigue, RN made aware. Will attempt to make-up missed time as able.      Therapy Documentation Precautions:  Precautions Precautions: Fall Precaution Comments: watch HR and SpO2 Restrictions Weight Bearing Restrictions: No General: PT Amount of Missed Time (min): 25 Minutes PT Missed Treatment Reason: Pain    Therapy/Group: Individual Therapy  Viviano Bir L Suheily Birks PT, DPT  03/05/2020, 4:35 PM

## 2020-03-05 NOTE — Progress Notes (Signed)
Dawn Beasley   Subjective/Complaints: Patient had BM with dig stim last night. No other complaints. Slept well for the first time in a while. Has some lower back pain. Hgb 7.9, trending downward slightly.   ROS: Pt denies SOB, abd pain, CP, N/V/C/D, and vision changes  Objective: No results found. Recent Labs    03/05/20 0525  WBC 9.4  HGB 7.9*  HCT 24.0*  PLT 311   Recent Labs    03/05/20 0525  NA 134*  K 3.4*  CL 95*  CO2 27  GLUCOSE 89  BUN 10  CREATININE 0.44  CALCIUM 8.7*    Intake/Output Summary (Last 24 hours) at 03/05/2020 0846 Last data filed at 03/05/2020 0436 Gross per 24 hour  Intake 405 ml  Output 1150 ml  Net -745 ml     Physical Exam: Vital Signs Blood pressure 125/88, pulse (!) 106, temperature 99 F (37.2 C), temperature source Oral, resp. rate 20, height 5\' 2"  (1.575 m), weight 50.2 kg, SpO2 90 %. Physical Exam Constitutional: Pt sitting up in bed comfortably.  HENT: conjugate gaze; thinning hair.  Cardiovascular:RRR Respiratory: no shallow breaths today; CTA b/L , NT, ND, (+)BS Musculoskeletal:  General: No edema.Normal range of motion.  Cervical back: Normal range of motion.  Neurological: Ox3  Motor upper ext 4/5 prox to distal. LE: 3+ to 4-/5 prox to 4+ distally with ADF/PF. No sensory dysfunction. Skin: Skin iswarm.No rashnoted. No erythema.  Psychiatric: Less anxious  Assessment/Plan: 1. Functional deficits secondary to debility from acute respiratory failure with hypoxiaextubated 02/13/2020/pulmonary emboli/acute metabolic encephalopathywhich require 3+ hours per day of interdisciplinary therapy in a comprehensive inpatient rehab setting.  Physiatrist is providing close team supervision and 24 hour management of active medical problems listed below.  Physiatrist and rehab team continue to assess barriers to discharge/monitor patient progress toward  functional and medical goals  Care Tool:  Bathing    Body parts bathed by patient: Right arm, Left arm, Chest, Abdomen, Front perineal area, Buttocks, Right upper leg, Left upper leg, Right lower leg, Left lower leg, Face         Bathing assist Assist Level: Minimal Assistance - Patient > 75%     Upper Body Dressing/Undressing Upper body dressing   What is the patient wearing?: Pull over shirt    Upper body assist Assist Level: Set up assist    Lower Body Dressing/Undressing Lower body dressing      What is the patient wearing?: Pants     Lower body assist Assist for lower body dressing: Moderate Assistance - Patient 50 - 74%     Toileting Toileting    Toileting assist Assist for toileting: Moderate Assistance - Patient 50 - 74%     Transfers Chair/bed transfer  Transfers assist     Chair/bed transfer assist level: Minimal Assistance - Patient > 75%     Locomotion Ambulation   Ambulation assist      Assist level: Minimal Assistance - Patient > 75% Assistive device: Hand held assist Max distance: 59ft   Walk 10 feet activity   Assist     Assist level: Minimal Assistance - Patient > 75% Assistive device: Hand held assist   Walk 50 feet activity   Assist    Assist level: Minimal Assistance - Patient > 75% Assistive device: Hand held assist    Walk 150 feet activity   Assist Walk 150 feet activity did not occur: Safety/medical concerns  Walk 10 feet on uneven surface  activity   Assist     Assist level: Minimal Assistance - Patient > 75% Assistive device: Hand held assist   Wheelchair     Assist Will patient use wheelchair at discharge?: No             Wheelchair 50 feet with 2 turns activity    Assist            Wheelchair 150 feet activity     Assist          Blood pressure 125/88, pulse (!) 106, temperature 99 F (37.2 C), temperature source Oral, resp. rate 20, height 5\' 2"  (1.575  m), weight 50.2 kg, SpO2 90 %.  Medical Problem List and Plan: 1.Decreased functional mobility with altered mental statussecondary to acute respiratory failure with hypoxiaextubated 02/13/2020/pulmonary emboli/acute metabolic encephalopathy -patient may shower -ELOS/Goals: 10-12 days, supervision to mod I goals  -Continue CIR PT, OT, SLP 2. Antithrombotics: -DVT/anticoagulation:Eliquisfor pulmonary emboli -antiplatelet therapy: n/a 3. Pain Management/chroniclow backpain:pt uses her oxycodone 4x daily at home on a schedule. Will schedule her doses at 0700 and 1200 daily, and leave other doses up to her in the PM.  -has spinal cord stimulator per Dr. Rolena Infante, Dr. Nelva Bush manages her chronic pain  4/17- will schedule tramadol 50 mg q6 hours for additional pain control  4/18- is working a little better for her- explained I cannot insist pain doctor do tramadol, but can con't here.  4. Mood/panic disorder:Xanax 0.5 mg twice daily as needed -pt was anxious about coming to rehab, people she might work with. Reassured her that we will take care and that she will do well.  4/18- feeling better about rehab -antipsychotic agents: N/A 5. Neuropsych: This patientiscapable of making decisions on herown behalf. 6. Skin/Wound Care:Routine skin checks 7. Fluids/Electrolytes/Nutrition:Routine in and outs with follow-up chemistries 9. AKI. Resolved. Follow-up chemistries on Monday  4/17- Cr looks good- no dehydration, elevated labs. 4/19: continues to be well controlled.  10. Acute on chronic diastolic congestive heart failure. Lasix 20 mg daily. Monitor for any signs of fluid overload. Check daily weights   Filed Weights   03/03/20 0425 03/04/20 0500 03/05/20 0650  Weight: 51.4 kg 51.6 kg 50.2 kg     4/19- weights stable- con't regimen 11. Pancytopenia. Follow-up labs on Monday 12. Hypertension. Hydralazine  25 mg every 8 hours. Monitor with increased mobility 13. Tobacco and alcohol abuse. Continue NicoDerm patch. Provide counseling 14. Urinary retention. Urecholine 5 mg 3 times daily. Check PVR -oob to void  4/17- nursing notes document foley catheter- will check into this  4/18- replaced foley catheter this AM- hers wasn't working- will see if can remove in next few days.  15. GERD. Protonix 16. Bowel incontinence  4/17- will order bowel program nightly after dinner- since works best after a meal-with dig stim and suppository. Since pt has NO control/feeling of BMs- not sure why. Incontinent documented.    4/18- pt had 2 BMs after bowel program last night- 10pm and 11:30 pm-   -not sure WHY having loss of bowel sensation/control- will d/w team. 17. Hypokalemia: K+ 3.4. Supplement with 85meq K+ and repeat BMP 4/20.     LOS: 3 days A FACE TO FACE EVALUATION WAS PERFORMED  Dawn Beasley 03/05/2020, 8:46 AM

## 2020-03-05 NOTE — IPOC Note (Signed)
Overall Plan of Care [facility]) Patient Details Name: Dawn Beasley MRN: [id number] DOB: [birth year]  Admitting Diagnosis: [place] [facility] Problems: Principal Problem:   Debility Active Problems:   Acute metabolic encephalopathy     Functional Problem List: Nursing Behavior, Bladder, Bowel, Edema, Endurance, Medication Management, Pain, Safety, Skin Integrity  PT Balance, Behavior, Safety, Endurance, Motor, Nutrition, Pain  OT Balance, Endurance, Motor, Pain, Sensory  SLP    TR         Basic ADL's: OT Bathing, Dressing, Toileting, Grooming     Advanced  ADL's: OT Simple Meal Preparation     Transfers: PT Bed Mobility, Bed to Chair, Car, Furniture, Floor  OT Tub/Shower     Locomotion: PT Ambulation, Stairs     Additional Impairments: OT None  SLP        TR      Anticipated Outcomes Item Anticipated Outcome  Self Feeding    Swallowing      Basic self-care  Mod I- supervision  Toileting  Mod I   Bathroom Transfers Mod I  Bowel/Bladder  Min assist to [facility] and maintain continence  Transfers  mod-I  Locomotion  supervision  Communication     Cognition     Pain  <4 on a 0-10 pain scale  Safety/Judgment  min assist with transfers with cues and reminders for assistance   Therapy Plan: PT Intensity: Minimum of 1-2 x/day ,45 to 90 minutes PT Frequency: 5 out of 7 days PT Duration Estimated Length of Stay: 10-12 days OT Intensity: Minimum of 1-2 x/day, 45 to 90 minutes OT Frequency: 5 out of 7 days OT Duration/Estimated Length of Stay: 10-12 days     Due to the current state of emergency, patients may not be receiving their 3-hours of Medicare-mandated therapy.   Team Interventions: Nursing Interventions Patient/Family Education, Pain Management, Bladder Management, Medication Management, Discharge Planning, Bowel Management, Skin Care/Wound Management, Psychosocial Support, Disease Management/Prevention  PT interventions Ambulation/gait training,  Community reintegration, DME/adaptive equipment instruction, Neuromuscular re-education, Psychosocial support, Stair training, UE/LE Strength taining/ROM, Training and development officer, Discharge planning, Functional electrical stimulation, Pain management, Skin care/wound management, Therapeutic Activities, UE/LE Coordination activities, Cognitive remediation/compensation, Disease management/prevention, Functional mobility training, Patient/family education, Splinting/orthotics, Visual/perceptual remediation/compensation, Therapeutic Exercise  OT Interventions Balance/vestibular training, Discharge planning, Pain management, Self Care/advanced ADL retraining, Therapeutic Activities, UE/LE Coordination activities, Functional mobility training, Patient/family education, Therapeutic Exercise, Visual/perceptual remediation/compensation, Community reintegration, Psychosocial support, UE/LE Strength taining/ROM, Wheelchair propulsion/positioning  SLP Interventions    TR Interventions    SW/CM Interventions     Barriers to Discharge MD  Medical stability  Nursing Inaccessible home environment, Decreased caregiver support, Medical stability, Home environment access/layout, Incontinence, Wound Care, Lack of/limited family support, Medication compliance    PT Inaccessible home environment, Home environment access/layout, Medical stability    OT      SLP      SW       Team Discharge Planning: Destination: PT-Home ,OT- Home , SLP-  Projected Follow-up: PT-24 hour supervision/assistance, Outpatient PT, OT-  None, SLP-  Projected Equipment Needs: PT-To be determined, OT- Tub/shower bench, SLP-  Equipment Details: PT- , OT-  Patient/family involved in discharge planning: PT- Patient,  OT-Patient, SLP-   MD ELOS: 10-12 days Medical Rehab Prognosis:  Excellent Assessment: Dawn Beasley is admitted to CIR with decreased functional mobility with altered mental statussecondary to acute respiratory failure with  hypoxiaextubated 02/13/2020/pulmonary emboli/acute metabolic encephalopathy. Active medical issued include pain; tramadol has been increased on 4/17. She has anxiety and  is receiving Xanax and reassurance. Her AKI is resolved. Urinary retention is being managed with urecholine 5mg  TID and PVR checks. Hypertension is being managed with hydralazine and vitals checks. Pancytopenia is being monitored. Constipation is being managed with digital stimulation and suppository. Hypokalemia is being managed with supplementation and monitoring of electrolytes. Continue intensive CIR therapies to improve mobility and ADLs.   See Team Conference Notes for weekly updates to the plan of care

## 2020-03-05 NOTE — Plan of Care (Signed)
  Problem: Consults Goal: RH GENERAL PATIENT EDUCATION Description: See Patient Education module for education specifics. Outcome: Progressing Goal: Skin Care Protocol Initiated - if Braden Score 18 or less Description: If consults are not indicated, leave blank or document N/A Outcome: Progressing   Problem: RH BOWEL ELIMINATION Goal: RH STG MANAGE BOWEL WITH ASSISTANCE Description: STG Manage Bowel with min Assistance. Outcome: Progressing Flowsheets (Taken 03/05/2020 1417) STG: Pt will manage bowels with assistance: 1-Total assistance Goal: RH STG MANAGE BOWEL W/MEDICATION W/ASSISTANCE Description: STG Manage Bowel with Medication with min Assistance. Outcome: Progressing Flowsheets (Taken 03/05/2020 1417) STG: Pt will manage bowels with medication with assistance: 4-Minimal assistance   Problem: RH BLADDER ELIMINATION Goal: RH STG MANAGE BLADDER WITH ASSISTANCE Description: STG Manage Bladder With min Assistance Outcome: Progressing Goal: RH STG MANAGE BLADDER WITH MEDICATION WITH ASSISTANCE Description: STG Manage Bladder With Medication With min Assistance. Outcome: Progressing Flowsheets (Taken 03/05/2020 1417) STG: Pt will manage bladder with medication with assistance: 1-Total assistance   Problem: RH SKIN INTEGRITY Goal: RH STG MAINTAIN SKIN INTEGRITY WITH ASSISTANCE Description: STG Maintain Skin Integrity With Supervision Assistance. Outcome: Progressing Flowsheets (Taken 03/05/2020 1417) STG: Maintain skin integrity with assistance: 4-Minimal assistance Goal: RH STG ABLE TO PERFORM INCISION/WOUND CARE W/ASSISTANCE Description: STG Able To Perform Incision/Wound Care With supervision Assistance. Outcome: Progressing Flowsheets (Taken 03/05/2020 1417) STG: Pt will be able to perform incision/wound care with assistance: 3-Moderate assistance   Problem: RH SAFETY Goal: RH STG ADHERE TO SAFETY PRECAUTIONS W/ASSISTANCE/DEVICE Description: STG Adhere to Safety Precautions  With min Assistance appropriate assistive Device. Outcome: Progressing Flowsheets (Taken 03/05/2020 1417) STG:Pt will adhere to safety precautions with assistance/device: 4-Minimal assistance   Problem: RH PAIN MANAGEMENT Goal: RH STG PAIN MANAGED AT OR BELOW PT'S PAIN GOAL Description: <4 on a 0-10 pain scale  Outcome: Progressing   Problem: RH KNOWLEDGE DEFICIT GENERAL Goal: RH STG INCREASE KNOWLEDGE OF SELF CARE AFTER HOSPITALIZATION Description: Patient will be able to demonstrate knowledge of care including but not limited to medication management, dietary management, skin care, and follow up care with the MD post discharge with min assist from CIR staff. Outcome: Progressing

## 2020-03-06 ENCOUNTER — Inpatient Hospital Stay (HOSPITAL_COMMUNITY): Payer: PPO

## 2020-03-06 ENCOUNTER — Inpatient Hospital Stay (HOSPITAL_COMMUNITY): Payer: PPO | Admitting: Occupational Therapy

## 2020-03-06 LAB — BASIC METABOLIC PANEL
Anion gap: 11 (ref 5–15)
BUN: 8 mg/dL (ref 8–23)
CO2: 26 mmol/L (ref 22–32)
Calcium: 8.6 mg/dL — ABNORMAL LOW (ref 8.9–10.3)
Chloride: 98 mmol/L (ref 98–111)
Creatinine, Ser: 0.38 mg/dL — ABNORMAL LOW (ref 0.44–1.00)
GFR calc Af Amer: >60 mL/min (ref >60)
GFR calc non Af Amer: >60 mL/min (ref >60)
Glucose, Bld: 95 mg/dL (ref 70–99)
Potassium: 3.7 mmol/L (ref 3.5–5.1)
Sodium: 135 mmol/L (ref 135–145)

## 2020-03-06 LAB — CBC
HCT: 24.3 % — ABNORMAL LOW (ref 36.0–46.0)
Hemoglobin: 7.9 g/dL — ABNORMAL LOW (ref 12.0–15.0)
MCH: 36.7 pg — ABNORMAL HIGH (ref 26.0–34.0)
MCHC: 32.5 g/dL (ref 30.0–36.0)
MCV: 113 fL — ABNORMAL HIGH (ref 80.0–100.0)
Platelets: 373 10*3/uL (ref 150–400)
RBC: 2.15 MIL/uL — ABNORMAL LOW (ref 3.87–5.11)
RDW: 13.2 % (ref 11.5–15.5)
WBC: 10.9 10*3/uL — ABNORMAL HIGH (ref 4.0–10.5)
nRBC: 0 % (ref 0.0–0.2)

## 2020-03-06 NOTE — Plan of Care (Signed)
  Problem: Consults Goal: RH GENERAL PATIENT EDUCATION Description: See Patient Education module for education specifics. Outcome: Progressing Goal: Skin Care Protocol Initiated - if Braden Score 18 or less Description: If consults are not indicated, leave blank or document N/A Outcome: Progressing   Problem: RH BOWEL ELIMINATION Goal: RH STG MANAGE BOWEL WITH ASSISTANCE Description: STG Manage Bowel with min Assistance. Outcome: Progressing Goal: RH STG MANAGE BOWEL W/MEDICATION W/ASSISTANCE Description: STG Manage Bowel with Medication with min Assistance. Outcome: Progressing   Problem: RH BLADDER ELIMINATION Goal: RH STG MANAGE BLADDER WITH ASSISTANCE Description: STG Manage Bladder With min Assistance Outcome: Progressing Goal: RH STG MANAGE BLADDER WITH MEDICATION WITH ASSISTANCE Description: STG Manage Bladder With Medication With min Assistance. Outcome: Progressing   Problem: RH SKIN INTEGRITY Goal: RH STG MAINTAIN SKIN INTEGRITY WITH ASSISTANCE Description: STG Maintain Skin Integrity With Supervision Assistance. Outcome: Progressing Goal: RH STG ABLE TO PERFORM INCISION/WOUND CARE W/ASSISTANCE Description: STG Able To Perform Incision/Wound Care With supervision Assistance. Outcome: Progressing   Problem: RH SAFETY Goal: RH STG ADHERE TO SAFETY PRECAUTIONS W/ASSISTANCE/DEVICE Description: STG Adhere to Safety Precautions With min Assistance appropriate assistive Device. Outcome: Progressing   Problem: RH PAIN MANAGEMENT Goal: RH STG PAIN MANAGED AT OR BELOW PT'S PAIN GOAL Description: <4 on a 0-10 pain scale  Outcome: Progressing   Problem: RH KNOWLEDGE DEFICIT GENERAL Goal: RH STG INCREASE KNOWLEDGE OF SELF CARE AFTER HOSPITALIZATION Description: Patient will be able to demonstrate knowledge of care including but not limited to medication management, dietary management, skin care, and follow up care with the MD post discharge with min assist from CIR  staff. Outcome: Progressing

## 2020-03-06 NOTE — Progress Notes (Signed)
Social Work Patient ID: Dawn Beasley, female   DOB: 04-23-1951, 69 y.o.   MRN: [id number]    SW met with pt and pt husband in room to provide updates from team conference, and anticipated d/c date 03/13/2020. Pt concerned if she will d/c to home with catheter. SW informed will relay concerns to pt concern Pt and husband informed there will be updates on pt plan of care. Pt medical concerns relayed to medical team.   Loralee Pacas, MSW, [place] Office: [phone] Cell: [phone] Fax: [phone]

## 2020-03-06 NOTE — Patient Care Conference (Signed)
Inpatient RehabilitationTeam Conference and Plan of Care Update Date: 03/06/2020   Time: 11:45 AM    Patient Name: Dawn Beasley      Medical Record Number: [id number]  Date of Birth: 1951-08-17 Sex: Female         Room/Bed: 4M01C/4M01C-01 Payor Info: Payor: [employer] / Plan: [employer] Must PPO / Product Type: *No Product type* /    Admit Date/Time:  03/02/2020  2:52 PM  Primary Diagnosis:  Debility  Patient Active Problem List   Diagnosis Date Noted  . Sacral decubitus ulcer, stage II ([place]) 03/02/2020  . Acute metabolic encephalopathy 92/42/6834  . Debility 03/02/2020  . Multiple subsegmental pulmonary emboli without acute cor pulmonale ([place])   . Delirium   . Pancytopenia ([place]) 02/12/2020  . Sepsis ([place]) 02/12/2020  . Aspiration pneumonia ([place]) 02/12/2020  . Acute on chronic respiratory failure with hypoxia ([place]) 02/12/2020  . Hyponatremia 02/12/2020  . AKI (acute kidney injury) ([place]) 02/12/2020  . Dehydration 02/12/2020  . Rhabdomyolysis 02/12/2020  . Elevated troponin 02/12/2020  . Respiratory failure ([place]) 02/12/2020  . Abnormal CT scan, colon 02/06/2020  . Nausea and vomiting 02/06/2020  . Essential hypertension   . 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 Encephalopathy acute 12/23/2016  . Transient alteration of awareness 12/23/2016  . Acute hyponatremia   . Speech disturbance   . Benzodiazepine withdrawal with complication ([place])   . Other chronic pain 12/06/2015  . Lumbar spinal stenosis 04/12/2014    Expected Discharge Date: Expected Discharge Date: 03/13/20  Team Members Present: Physician leading conference: Dr. Courtney Heys Care Coodinator Present: Loralee Pacas, LCSWA;Christina Sampson Goon, BSW;Genie Angelique Chevalier, RN, MSN Nurse Present: Other (comment)(Dionne Rulon Eisenmenger, RN) PT Present: Apolinar Junes, PT OT Present: Elisabeth Most, OT SLP Present: Weston Anna, SLP PPS Coordinator present : Ileana Ladd, Burna Mortimer, SLP     Current  Status/Progress Goal Weekly Team Focus  Bowel/Bladder   She is continent/Incontient of bowels and has a foley for voiding.  To be more continent  Ask pt if she needs to use the bedpan or bathroom regularly for bowel movements or as needed.   Swallow/Nutrition/ Hydration             ADL's   UB bathing/dressing set up, LB bathing/dressing min/mod A, CGA transfers  CS  adl training, transfer training, family education   Mobility   Supervision bed mobility, CGA-close supervision sit<>stand & stand pivot tranfers w/o AD, CGA gait up to 144 ft with rollator and min A 47 ft with R HHA, mod A 4 steps using R HR only  Mod-I bed mobility and transfers, supervision gait and 2 steps 1 rail  Activity tolerance, functional mobility, standing balance, gait w/ LRAD, LE strengthening, back pain management, patient/caregiver education   Communication             Safety/Cognition/ Behavioral Observations            Pain   Pain is currently 7/10 and she gets scheduled tramadol and got PRN oxycodone  To remain pain free or have less pain  Assess pain level q shift and as needed.   Skin   Has stage 2 to bottom  To prevent further breakdown  Assess skin q shift and change dressings as needed.    Rehab Goals Patient on target to meet rehab goals: Yes *See Care Plan and progress notes for long and short-term goals.     Barriers to Discharge  Current Status/Progress Possible Resolutions Date Resolved   Nursing  PT  Home environment access/layout;Other (comments)  Patient has 2 STE home with 1 rail, has significant chronic back pain limiting mobility in the afternoon              OT                  SLP                SW Decreased caregiver support;Lack of/limited family support Husband works            Discharge Planning/Teaching Needs:  D/c to home with intermittent support. Pt states she has a friend that can check in PRN while husband is at work 7am-5pm.  Family education as  recommended by  therapy   Team Discussion: MD needs bowel program, foley catheter X 2 weeks, WBC 10.9, labs tomorrow, temp 99.2.  RN catheter, inc B/B, stage 2 on coccyx.  OT set up UB D, min/mod LB D, CGA transfers.  PT decreased sensation feet/ankles, concerned about B/B incontinence, has spinal stimulator, lost of pain, amb 144' rollator CGA/close S, stairs mod A.  Can be alone for short periods, husband works, friend can check in, sons work.   Revisions to Treatment Plan: N/A     Medical Summary Current Status: refusing miralax- incontinent of stool- has foley; stage II- coccyx- foam dressing on it Weekly Focus/Goal: OT- s/u UB; min-mod assist LB ADLs; CGA transfers;  Barriers to Discharge: Incontinence;Neurogenic Bowel & Bladder;Medical stability;Decreased family/caregiver support;Home enviroment access/layout;Other (comments)  Barriers to Discharge Comments: foley- new SCI? will get CT- has stimulator- can't get MRI- 4/27 Possible Resolutions to Barriers: new decreased sensation in feet/ankles- rating 7-9/10- walked 144 ft rOLLATOR YESTERDAY; 50 ft without AD;   Continued Need for Acute Rehabilitation Level of Care: The patient requires daily medical management by a physician with specialized training in physical medicine and rehabilitation for the following reasons: Direction of a multidisciplinary physical rehabilitation program to maximize functional independence : Yes Medical management of patient stability for increased activity during participation in an intensive rehabilitation regime.: Yes Analysis of laboratory values and/or radiology reports with any subsequent need for medication adjustment and/or medical intervention. : Yes   I attest that I was present, lead the team conference, and concur with the assessment and plan of the team.   Lelon Frohlich M 03/06/2020, 6:03 PM   Team conference was held via web/ teleconference due to COVID - 19

## 2020-03-06 NOTE — Progress Notes (Signed)
Physical Therapy Session Note  Patient Details  Name: Dawn Beasley MRN: [id number] Date of Birth: [birth year]  Today's Date: 03/06/2020 PT Individual Time: 7619-5093 and 1301-1350 PT Individual Time Calculation (min): 49 min   Short Term Goals: Week 1:  PT Short Term Goal 1 (Week 1): Pt will perform sit<>stands with CGA PT Short Term Goal 2 (Week 1): Pt will perform stand pivot transfers with CGA PT Short Term Goal 3 (Week 1): Pt will ambulate at least 161ft using LRAD with CGA PT Short Term Goal 4 (Week 1): Pt will ascend/descend 4 steps using 1HR and min assist  Skilled Therapeutic Interventions/Progress Updates:     Session 1: Patient in bed upon PT arrival. Patient alert and agreeable to PT session. Patient reported 8/10 back pain during session, RN made aware and provided medication during session. PT provided repositioning, rest breaks, and distraction as pain interventions throughout session. Patient reported feeling increased fatigue today due to frequent bowl movements last night. Patient was very distressed about her bowl movements and incontinence. PT provided general education on managing a bowl program and encouraged the patient to talk to Dr. Berline Chough about her changes in bowls and will report patient's concerns in team conference later this morning.  Therapeutic Activity: Bed Mobility: Patient performed supine to/from sit with supervision on a flat hospital bed, ADL bed and mat table with blue wedge and pillow. Provided verbal cues and demonstration on mat table for performing log rolling for. Transfers: Patient performed sit to/from stand from a hospital bed, ADL bed, ADL couch, mat table, and rollator with close supervision for safety. Provided verbal cues for reaching back to sit and contorlled descent for safety and reduced back pain with mobility.  Gait Training:  Patient ambulated 82 feet, and 92 feet x2 using a rollator for steadying support and due to decreased endurance  with CGA. Ambulated with decreased gait speed, decreased step length and height, increased B hip and knee flexion in stance, forward trunk lean, and downward head gaze. Provided verbal cues for erect posture, paced breathing, and increased step height.  Neuromuscular Re-ed: Patient performed the following motor control activities lying in supine on a mat table with a blue wedge and pillow for comfort due to increased back pain: -abdominal drawing in maneuver with 3-5 sec hold 2x10 -diaphragmatic breathing with guided imagery for relaxation, increased motor cotrol of diaphragm for increased breath support, and pain management  Manual Therapy: Provided soft tissue mobilization to erector spinae and quadratus lumborum for muscle tension release and pain management with patient in side-lying, performed in patient's room with lights off and door closed for a quiet environment due to patient reporting increased anxiety and pain with mobility in the therapy gym. Patient reported improved back pain after, 6/10.   Patient in bed at end of session with breaks locked, bed alarm set, and all needs within reach. Patient suddenly became nauseous prior to PT departure and had 2 bouts of emesis into emesis bag, RN made aware.  Session 2:  Patient in bed with her husband at bedside upon PT arrival. Patient alert and agreeable to PT session. Patient reported 6/10 back pain during session, RN made aware. PT provided repositioning, rest breaks, and distraction as pain interventions throughout session. She reported improved nausea from this morning, however, was very upset about hearing that her d/c date would be next Tuesday, as patient had hoped to be home this weekend. Discussed barriers to d/c with patient and her husband and continued  investigation and management of decreased bowl and bladder control. Discussed patient needing to be home alone with PRN assist from a neighbor at d/c and PT goals set at [facility] for patient  to safely be able to move around her home independently. Patient and her husband receptive and appreciative of discussion/education.  Therapeutic Activity: Bed Mobility: Patient performed supine to/from sit with supervision. Provided verbal cues for log rolling. Transfers: Patient performed sit to/from stand x2 with supervision, reported dizziness in standing both trials. Following first trial HR 122, BP 125/66, SPO2 91% on RA. In standing during second trial HR 137, BP 100/67, SPO2 95% on RA. RN made aware of elevated HR and OH in standing.   Patient very distressed by limited mobility today and dizziness in standing. Comforted patient and provided education on [place]. Performed diaphragmatic breathing with patient and maintained low stim environment to reduce patient's anxiety.   Patient in bed at end of session with breaks locked, bed alarm set, and all needs within reach. RN to follow-up with vitals due to patient's anxiety.    Therapy Documentation Precautions:  Precautions Precautions: Fall Precaution Comments: watch HR and SpO2 Restrictions Weight Bearing Restrictions: No    Therapy/Group: Individual Therapy  Berish Bohman L Meghan Warshawsky PT, DPT  03/06/2020, 4:53 PM

## 2020-03-06 NOTE — Progress Notes (Signed)
[place] PHYSICAL MEDICINE & REHABILITATION PROGRESS NOTE   Subjective/Complaints:  Pt reports refused bowel program last night since had BM, but then proceeded to have 2-3 additional BMs afterwards- had a BM in middle of dinner.  However, this AM, early, felt like needed to have BM and did so on [facility], which is new/great.   Says doing "great' in therapy.  Pt has WBC of 10.9- and temperature of 99.2- in last 2 4hours -no Sx's of infeciton- will check again in AM  Per nursing, had traumatic cath and so needs foley for foreseeable future- will need to f/u with Urology.    ROS:  Pt denies SOB, abd pain, CP, N/V/C/D, and vision changes    Objective: No results found. Recent Labs    03/05/20 0525 03/06/20 0532  WBC 9.4 10.9*  HGB 7.9* 7.9*  HCT 24.0* 24.3*  PLT 311 373   Recent Labs    03/05/20 0525 03/06/20 0532  NA 134* 135  K 3.4* 3.7  CL 95* 98  CO2 27 26  GLUCOSE 89 95  BUN 10 8  CREATININE 0.44 0.38*  CALCIUM 8.7* 8.6*    Intake/Output Summary (Last 24 hours) at 03/06/2020 0925 Last data filed at 03/06/2020 0413 Gross per 24 hour  Intake --  Output 550 ml  Net -550 ml     Physical Exam: Vital Signs Blood pressure 132/76, pulse (!) 105, temperature 99 F (37.2 C), temperature source Oral, resp. rate 16, height 5\' 2"  (1.575 m), weight 48.8 kg, SpO2 91 %. Physical Exam Constitutional: pt sitting up eating breakfast, appropriate, NAD HENT: conjugate gaze; thinning hair.  Cardiovascular:RRR_ no JVD Respiratory: CTA b/l , NT, ND, (+)BS  Musculoskeletal:  General: No edema.Normal range of motion.  Cervical back: Normal range of motion.  Neurological: Ox3  Motor upper ext 4/5 prox to distal. LE: 3+ to 4-/5 prox to 4+ distally with ADF/PF. No sensory dysfunction. Skin: Skin iswarm.No rashnoted. No erythema.  Psychiatric: Not anxious at all this am  Assessment/Plan: 1. Functional deficits secondary to debility from acute  respiratory failure with hypoxiaextubated 02/13/2020/pulmonary emboli/acute metabolic encephalopathywhich require 3+ hours per day of interdisciplinary therapy in a comprehensive inpatient rehab setting.  Physiatrist is providing close team supervision and 24 hour management of active medical problems listed below.  Physiatrist and rehab team continue to assess barriers to discharge/monitor patient progress toward functional and medical goals  Care Tool:  Bathing    Body parts bathed by patient: Right arm, Left arm, Chest, Abdomen, Front perineal area, Buttocks, Right upper leg, Left upper leg, Right lower leg, Left lower leg, Face         Bathing assist Assist Level: Supervision/Verbal cueing     Upper Body Dressing/Undressing Upper body dressing   What is the patient wearing?: Pull over shirt    Upper body assist Assist Level: Set up assist    Lower Body Dressing/Undressing Lower body dressing      What is the patient wearing?: Pants     Lower body assist Assist for lower body dressing: Moderate Assistance - Patient 50 - 74%     Toileting Toileting    Toileting assist Assist for toileting: Minimal Assistance - Patient > 75%     Transfers Chair/bed transfer  Transfers assist     Chair/bed transfer assist level: Supervision/Verbal cueing     Locomotion Ambulation   Ambulation assist      Assist level: Contact Guard/Touching assist Assistive device: Rollator Max distance: 144'  Walk 10 feet activity   Assist     Assist level: Minimal Assistance - Patient > 75% Assistive device: Hand held assist   Walk 50 feet activity   Assist    Assist level: Contact Guard/Touching assist Assistive device: Rollator    Walk 150 feet activity   Assist Walk 150 feet activity did not occur: Safety/medical concerns         Walk 10 feet on uneven surface  activity   Assist     Assist level: Minimal Assistance - Patient > 75% Assistive device:  Hand held assist   Wheelchair     Assist Will patient use wheelchair at discharge?: No             Wheelchair 50 feet with 2 turns activity    Assist            Wheelchair 150 feet activity     Assist          Blood pressure 132/76, pulse (!) 105, temperature 99 F (37.2 C), temperature source Oral, resp. rate 16, height 5\' 2"  (1.575 m), weight 48.8 kg, SpO2 91 %.  Medical Problem List and Plan: 1.Decreased functional mobility with altered mental statussecondary to acute respiratory failure with hypoxiaextubated 02/13/2020/pulmonary emboli/acute metabolic encephalopathy -patient may shower -ELOS/Goals: 10-12 days, supervision to mod I goals  -Continue CIR PT, OT, SLP 2. Antithrombotics: -DVT/anticoagulation:Eliquisfor pulmonary emboli -antiplatelet therapy: n/a 3. Pain Management/chroniclow backpain:pt uses her oxycodone 4x daily at home on a schedule. Will schedule her doses at 0700 and 1200 daily, and leave other doses up to her in the PM.  -has spinal cord stimulator per Dr. Rolena Infante, Dr. Nelva Bush manages her chronic pain  4/17- will schedule tramadol 50 mg q6 hours for additional pain control  4/18- is working a little better for her- explained I cannot insist pain doctor do tramadol, but can con't here.  4. Mood/panic disorder:Xanax 0.5 mg twice daily as needed -pt was anxious about coming to rehab, people she might work with. Reassured her that we will take care and that she will do well.  4/18- feeling better about rehab -antipsychotic agents: N/A 5. Neuropsych: This patientiscapable of making decisions on herown behalf. 6. Skin/Wound Care:Routine skin checks 7. Fluids/Electrolytes/Nutrition:Routine in and outs with follow-up chemistries 9. AKI. Resolved. Follow-up chemistries on Monday  4/17- Cr looks good- no dehydration, elevated labs. 4/19: continues to be well  controlled.  10. Acute on chronic diastolic congestive heart failure. Lasix 20 mg daily. Monitor for any signs of fluid overload. Check daily weights   Filed Weights   03/04/20 0500 03/05/20 0650 03/06/20 0406  Weight: 51.6 kg 50.2 kg 48.8 kg     4/20- weights stable- con't regimen down to 48.8 kg 11. Pancytopenia. Follow-up labs on Monday 12. Hypertension. Hydralazine 25 mg every 8 hours. Monitor with increased mobility 13. Tobacco and alcohol abuse. Continue NicoDerm patch. Provide counseling 14. Urinary retention. Urecholine 5 mg 3 times daily. Check PVR -oob to void  4/17- nursing notes document foley catheter- will check into this  4/18- replaced foley catheter this AM- hers wasn't working- will see if can remove in next few days.   4/20- per nurse, foley was due to a traumatic cath- urology says keep it for ~ 2-3 weeks- will need to go home with it and f/u with Urology.  15. GERD. Protonix 16. Bowel incontinence  4/17- will order bowel program nightly after dinner- since works best after a meal-with dig stim and suppository. Since  pt has NO control/feeling of BMs- not sure why. Incontinent documented.    4/18- pt had 2 BMs after bowel program last night- 10pm and 11:30 pm-   -not sure WHY having loss of bowel sensation/control- will d/w team.  4/20- tolerating bowel program 17. Hypokalemia: K+ 3.4. Supplement with K+ and repeat BMP 4/20.   4/20- K+ up to 3.7   LOS: 4 days A FACE TO FACE EVALUATION WAS PERFORMED  Tierra Divelbiss 03/06/2020, 9:25 AM

## 2020-03-06 NOTE — Progress Notes (Signed)
Occupational Therapy Session Note  Patient Details  Name: Dawn Beasley MRN: [id number] Date of Birth: [birth year]  Today's Date: 03/06/2020 OT Individual Time: 0131-4388 OT Individual Time Calculation (min): 65 min   Missed 10 minutes end of session due to fatigue Short Term Goals: Week 1:  OT Short Term Goal 1 (Week 1): Patient will complete LB dressing with supervision. OT Short Term Goal 2 (Week 1): Patient will complete toilet transfers with supervision and min cues for technique. OT Short Term Goal 3 (Week 1): Patient will engage in functional activity in standing for 3 minutes without rest break to increase functional endurance for ADLs.  Skilled Therapeutic Interventions/Progress Updates:    Patient in bed, alert, states that today "isn't a good day" - she states that pain in back is under control at this time.   Vitals supine - 129/72, HR 110, O2 sat 92% Bed mobility to sitting position edge of bed with CS.  Completed light UB and LB AROM activities in unsupported sitting without difficulty Vitals in sitting:  128/69, HR 119, O2 sat 93% (nursing aware) SPT bed to/from w/c with CGA.  She completed oral care, face washing mod I seated w/c level, mod A to wash hair using shampoo tray.  She is able to dry hair mod A with hair dryer.   Returned to bed at close of session, bed alarm set and call bell in reach.    Therapy Documentation Precautions:  Precautions Precautions: Fall Precaution Comments: watch HR and SpO2 Restrictions Weight Bearing Restrictions: No General:   Vital Signs: Therapy Vitals Temp: 99 F (37.2 C) Temp Source: Oral Pulse Rate: (!) 105 Resp: 16 BP: 132/76 Patient Position (if appropriate): Lying Oxygen Therapy SpO2: 91 % O2 Device: Room Air Therapy/Group: Individual Therapy  Barrie Lyme 03/06/2020, 7:39 AM

## 2020-03-07 ENCOUNTER — Inpatient Hospital Stay (HOSPITAL_COMMUNITY): Payer: PPO

## 2020-03-07 ENCOUNTER — Inpatient Hospital Stay (HOSPITAL_COMMUNITY): Payer: PPO | Admitting: Occupational Therapy

## 2020-03-07 LAB — CBC WITH DIFFERENTIAL/PLATELET
Abs Immature Granulocytes: 0.05 10*3/uL (ref 0.00–0.07)
Basophils Absolute: 0 10*3/uL (ref 0.0–0.1)
Basophils Relative: 0 %
Eosinophils Absolute: 0.1 10*3/uL (ref 0.0–0.5)
Eosinophils Relative: 1 %
HCT: 24.2 % — ABNORMAL LOW (ref 36.0–46.0)
Hemoglobin: 7.9 g/dL — ABNORMAL LOW (ref 12.0–15.0)
Immature Granulocytes: 0 %
Lymphocytes Relative: 8 %
Lymphs Abs: 1 10*3/uL (ref 0.7–4.0)
MCH: 36.6 pg — ABNORMAL HIGH (ref 26.0–34.0)
MCHC: 32.6 g/dL (ref 30.0–36.0)
MCV: 112 fL — ABNORMAL HIGH (ref 80.0–100.0)
Monocytes Absolute: 0.9 10*3/uL (ref 0.1–1.0)
Monocytes Relative: 8 %
Neutro Abs: 9.8 10*3/uL — ABNORMAL HIGH (ref 1.7–7.7)
Neutrophils Relative %: 83 %
Platelets: 374 10*3/uL (ref 150–400)
RBC: 2.16 MIL/uL — ABNORMAL LOW (ref 3.87–5.11)
RDW: 13.4 % (ref 11.5–15.5)
WBC: 12 10*3/uL — ABNORMAL HIGH (ref 4.0–10.5)
nRBC: 0 % (ref 0.0–0.2)

## 2020-03-07 LAB — BASIC METABOLIC PANEL
Anion gap: 10 (ref 5–15)
BUN: 7 mg/dL — ABNORMAL LOW (ref 8–23)
CO2: 24 mmol/L (ref 22–32)
Calcium: 8.5 mg/dL — ABNORMAL LOW (ref 8.9–10.3)
Chloride: 98 mmol/L (ref 98–111)
Creatinine, Ser: 0.41 mg/dL — ABNORMAL LOW (ref 0.44–1.00)
GFR calc Af Amer: >60 mL/min (ref >60)
GFR calc non Af Amer: >60 mL/min (ref >60)
Glucose, Bld: 92 mg/dL (ref 70–99)
Potassium: 3.3 mmol/L — ABNORMAL LOW (ref 3.5–5.1)
Sodium: 132 mmol/L — ABNORMAL LOW (ref 135–145)

## 2020-03-07 MED ORDER — POTASSIUM CHLORIDE CRYS ER 20 MEQ PO TBCR
40.0000 meq | EXTENDED_RELEASE_TABLET | Freq: Two times a day (BID) | ORAL | Status: AC
  Administered 2020-03-07 (×2): 40 meq via ORAL
  Filled 2020-03-07 (×2): qty 2

## 2020-03-07 NOTE — Progress Notes (Signed)
Pt refused scheduled miralax and suppository. Pt had multiple loose stools prior to shift change and through out night shift.  Type six/seven, medium amount, brown color.

## 2020-03-07 NOTE — Progress Notes (Signed)
[place] PHYSICAL MEDICINE & REHABILITATION PROGRESS NOTE   Subjective/Complaints:   Pt reports she's "pooping all the time", at least 5 BMs in last 24 hours- but also refused bowel program last night again- so hasn't had in 2-3 days.   Also had tachycardia- running ~110s las tnight- but pt admits drinking ~ >1L/day based on what she described.   Pt said only Sx's is multiple stools. '   ROS:   Pt denies SOB, abd pain, CP, N/V/C/D, and vision changes    Objective: No results found. Recent Labs    03/06/20 0532 03/07/20 0507  WBC 10.9* 12.0*  HGB 7.9* 7.9*  HCT 24.3* 24.2*  PLT 373 374   Recent Labs    03/06/20 0532 03/07/20 0507  NA 135 132*  K 3.7 3.3*  CL 98 98  CO2 26 24  GLUCOSE 95 92  BUN 8 7*  CREATININE 0.38* 0.41*  CALCIUM 8.6* 8.5*    Intake/Output Summary (Last 24 hours) at 03/07/2020 0855 Last data filed at 03/07/2020 0750 Gross per 24 hour  Intake 1240 ml  Output 700 ml  Net 540 ml     Physical Exam: Vital Signs Blood pressure 120/70, pulse (!) 107, temperature 98.6 F (37 C), temperature source Oral, resp. rate 17, height 5\' 2"  (1.575 m), weight 49.1 kg, SpO2 93 %. Physical Exam Constitutional: pt sitting up eating breakfast, appropriate, NAD HENT: conjugate gaze; thinning hair.  Cardiovascular:RRR_ no JVD Respiratory: CTA b/l , NT, ND, (+)BS  Musculoskeletal:  General: No edema.Normal range of motion.  Cervical back: Normal range of motion.  Neurological: Ox3  Motor upper ext 4/5 prox to distal. LE: 3+ to 4-/5 prox to 4+ distally with ADF/PF. No sensory dysfunction. Skin: Skin iswarm.No rashnoted. No erythema.  Psychiatric: Not anxious at all this am  Assessment/Plan: 1. Functional deficits secondary to debility from acute respiratory failure with hypoxiaextubated 02/13/2020/pulmonary emboli/acute metabolic encephalopathywhich require 3+ hours per day of interdisciplinary therapy in a comprehensive  inpatient rehab setting.  Physiatrist is providing close team supervision and 24 hour management of active medical problems listed below.  Physiatrist and rehab team continue to assess barriers to discharge/monitor patient progress toward functional and medical goals  Care Tool:  Bathing    Body parts bathed by patient: Right arm, Left arm, Chest, Abdomen, Front perineal area, Buttocks, Right upper leg, Left upper leg, Right lower leg, Left lower leg, Face         Bathing assist Assist Level: Supervision/Verbal cueing     Upper Body Dressing/Undressing Upper body dressing   What is the patient wearing?: Pull over shirt    Upper body assist Assist Level: Set up assist    Lower Body Dressing/Undressing Lower body dressing      What is the patient wearing?: Pants     Lower body assist Assist for lower body dressing: Moderate Assistance - Patient 50 - 74%     Toileting Toileting    Toileting assist Assist for toileting: Minimal Assistance - Patient > 75%     Transfers Chair/bed transfer  Transfers assist     Chair/bed transfer assist level: Supervision/Verbal cueing     Locomotion Ambulation   Ambulation assist      Assist level: Supervision/Verbal cueing Assistive device: Rollator Max distance: 16'   Walk 10 feet activity   Assist     Assist level: Supervision/Verbal cueing Assistive device: Rollator   Walk 50 feet activity   Assist    Assist level: Supervision/Verbal cueing  Assistive device: Rollator    Walk 150 feet activity   Assist Walk 150 feet activity did not occur: Safety/medical concerns         Walk 10 feet on uneven surface  activity   Assist     Assist level: Minimal Assistance - Patient > 75% Assistive device: Hand held assist   Wheelchair     Assist Will patient use wheelchair at discharge?: No             Wheelchair 50 feet with 2 turns activity    Assist            Wheelchair 150  feet activity     Assist          Blood pressure 120/70, pulse (!) 107, temperature 98.6 F (37 C), temperature source Oral, resp. rate 17, height 5\' 2"  (1.575 m), weight 49.1 kg, SpO2 93 %.  Medical Problem List and Plan: 1.Decreased functional mobility with altered mental statussecondary to acute respiratory failure with hypoxiaextubated 02/13/2020/pulmonary emboli/acute metabolic encephalopathy -patient may shower -ELOS/Goals: 10-12 days, supervision to mod I goals  -Continue CIR PT, OT, SLP 2. Antithrombotics: -DVT/anticoagulation:Eliquisfor pulmonary emboli -antiplatelet therapy: n/a 3. Pain Management/chroniclow backpain:pt uses her oxycodone 4x daily at home on a schedule. Will schedule her doses at 0700 and 1200 daily, and leave other doses up to her in the PM.  -has spinal cord stimulator per Dr. 02/15/2020, Dr. Shon Baton manages her chronic pain  4/17- will schedule tramadol 50 mg q6 hours for additional pain control  4/18- is working a little better for her- explained I cannot insist pain doctor do tramadol, but can con't here.  4. Mood/panic disorder:Xanax 0.5 mg twice daily as needed -pt was anxious about coming to rehab, people she might work with. Reassured her that we will take care and that she will do well.  4/18- feeling better about rehab -antipsychotic agents: N/A 5. Neuropsych: This patientiscapable of making decisions on herown behalf. 6. Skin/Wound Care:Routine skin checks 7. Fluids/Electrolytes/Nutrition:Routine in and outs with follow-up chemistries 9. AKI. Resolved. Follow-up chemistries on Monday  4/17- Cr looks good- no dehydration, elevated labs. 4/19: continues to be well controlled.  10. Acute on chronic diastolic congestive heart failure. Lasix 20 mg daily. Monitor for any signs of fluid overload. Check daily weights   Filed Weights   03/05/20 0650 03/06/20 0406  03/07/20 0411  Weight: 50.2 kg 48.8 kg 49.1 kg     4/20- weights stable- con't regimen down to 48.8 kg 11. Pancytopenia. Follow-up labs on Monday 12. Hypertension. Hydralazine 25 mg every 8 hours. Monitor with increased mobility 13. Tobacco and alcohol abuse. Continue NicoDerm patch. Provide counseling 14. Urinary retention. Urecholine 5 mg 3 times daily. Check PVR -oob to void  4/17- nursing notes document foley catheter- will check into this  4/18- replaced foley catheter this AM- hers wasn't working- will see if can remove in next few days.   4/20- per nurse, foley was due to a traumatic cath- urology says keep it for ~ 2-3 weeks- will need to go home with it and f/u with Urology.   4/21- pt wants to get foley out- but will see if can void/check after CT of lumbar/throacic spine-  15. GERD. Protonix 16. Bowel incontinence  4/17- will order bowel program nightly after dinner- since works best after a meal-with dig stim and suppository. Since pt has NO control/feeling of BMs- not sure why. Incontinent documented.    4/18- pt had 2 BMs after bowel  program last night- 10pm and 11:30 pm-   -not sure WHY having loss of bowel sensation/control- will d/w team.  4/21- refused bowel program 2x in last 2 days- getting CT of thoracic and lumbar spine to see why might need bowel program/ SCI? Also ordered Cdiff since 5 loose BM sin last 24 hours.  17. Hypokalemia: K+ 3.4. Supplement with 49meq K+ and repeat BMP 4/20.   4/20- K+ up to 3.7  4/21- Give KCL 40 mEq x2 and recheck in AM   LOS: 5 days A FACE TO FACE EVALUATION WAS PERFORMED  Kearia Yin 03/07/2020, 8:55 AM

## 2020-03-07 NOTE — Progress Notes (Signed)
Physical Therapy Session Note  Patient Details  Name: Dawn Beasley MRN: [id number] Date of Birth: 15-Nov-1951  Today's Date: 03/07/2020 PT Individual Time: 0915-1015 PT Individual Time Calculation (min): 60 min   Short Term Goals: Week 1:  PT Short Term Goal 1 (Week 1): Pt will perform sit<>stands with CGA PT Short Term Goal 2 (Week 1): Pt will perform stand pivot transfers with CGA PT Short Term Goal 3 (Week 1): Pt will ambulate at least 172ft using LRAD with CGA PT Short Term Goal 4 (Week 1): Pt will ascend/descend 4 steps using 1HR and min assist  Skilled Therapeutic Interventions/Progress Updates:     Patient in bed with RN in roomupon PT arrival. Patient alert and agreeable to PT session. Patient reported 7/10 back pain during session, RN made aware. PT provided repositioning, rest breaks, and distraction as pain interventions throughout session.   Therapeutic Activity: Bed Mobility: Patient performed supine to/from sit with supervision. Provided verbal cues for log rolling. Transfers: Patient performed sit to/from stand x7 with supervision with and without use of a rollator. Provided verbal cues for using breaks for transfers on the rollator.  Gait Training:  Patient ambulated 120 feet x2 using a rollator and 10-15 feet x3 in the room to simulate home ambulation without an AD. Ambulated with decreased gait speed, decreased step length and height, increased B hip and knee flexion in stance, forward trunk lean, and downward head gaze. Provided verbal cues for erect posture, shoulder depression, and paced breathing throughout. Patient ascended/descended 3-6" steps using R rail with B UE support and side stepping technique with CGA. Performed step-to gait pattern leading with L while ascending and R while descending. Provided cues for technique and sequencing.  Neuromuscular Re-ed: Patient performed the following motor control activities for improved core strengthening and back  support: -abdominal drawing-in maneuver with alternating marching in hood-lying 2x10 -abdominal drawing-in maneuver with knee and hip flexion/extension 2x10 Provided cues for transverse abdominus activation and breath support throughout  Patient in bed at end of session with breaks locked, bed alarm set, and all needs within reach. Patient concerned about going home with a catheter, discussed with PA and PA came in to inform patient about going home with a catheter and follow-up with OP urology. Asked to have patient switched to a leg bag for easier mobility and dressing, RN made aware. Educated patient on practicing emptying the catheter and management with mobility and ADLs during therapy. Patient receptive and appreciative of discussion.    Therapy Documentation Precautions:  Precautions Precautions: Fall Precaution Comments: watch HR and SpO2 Restrictions Weight Bearing Restrictions: No    Therapy/Group: Individual Therapy  Emy Angevine L Aftyn Nott PT, DPT  03/07/2020, 12:27 PM

## 2020-03-07 NOTE — Progress Notes (Signed)
Occupational Therapy Session Note  Patient Details  Name: Dawn Beasley MRN: [id number] Date of Birth: 02/06/1951  Today's Date: 03/07/2020 OT Individual Time: 1047-1158 OT Individual Time Calculation (min): 71 min   OT Individual Time: 1350-1445 OT Individual Time Calculation (min): 55 min   Short Term Goals: Week 1:  OT Short Term Goal 1 (Week 1): Patient will complete LB dressing with supervision. OT Short Term Goal 2 (Week 1): Patient will complete toilet transfers with supervision and min cues for technique. OT Short Term Goal 3 (Week 1): Patient will engage in functional activity in standing for 3 minutes without rest break to increase functional endurance for ADLs.  Skilled Therapeutic Interventions/Progress Updates:  Session 1: Patient met lying supine in bed in agreement with OT treatment session with focus on self-care re-education, energy conservation, functional transfers, and functional mobility during ADL tasks as detailed below. Supine to EOB with supervision A. Patient able to ambulate to dresser with use of rollator and supervision A. ADL item retrieval seated on rollator with supervision A. Education on energy conservation techniques including self awareness for seated rest breaks as needed, activity pacing, and task segmentation. Patient able to return demonstrate 3/3 activity conservation strategies throughout treatment session with 3-4 verbal cues. Patient engaged in bathing seated on TTB in walk-in shower with supervision A and increased time secondary to fatigue. Patient with request for total assist for transport seated on rollator from bathroom to EOB. Patient able to don UB clothing seated EOB. LB dressing with Min A and education on threading catheter bag through underwear and pants. Oral hygiene completed in standing at sink level with patient requiring 1 seated rest break secondary to SOB. Functional mobility from room to ortho gym with supervision A. Patient completed 1  bout of 3 min on arm ergometer on level 3 with focus on endurance and UB strengthening in prep for completion of ADLs/IADLs. Total A for transport from ortho gym to nurses station and supervision A for ambulation from nurses station to EOB with use of rollator. Session concluded with patient lying supine in bed with call bell within reach, bed alarm activated and all needs met.   Session 2: Patient met lying supine in bed in agreement with OT treatment session. Focus on activity tolerance, standing balance, and functional mobility/transfers in prep for completion of ADLs/IADLs as detailed below. Patient ambulated from room <> therapy gym with supervision A and 1-2 seated rest breaks secondary to fatigue and chronic low back pain 7-8/10 with activity (premedicated). Patient completed folding laundry in standing for 1 bout of 3:23 sec. Dynamic standing balance and standing tolerance x2 trials with patient able to tolerate 1 bout of 2:10 sec and 1 bout of 1:03 sec. Standing tolerance limited by low back pain. OT provided handout on energy conservation strategies discussed in previous OT treatment session with patient expressing verbal understanding. Session concluded with patient lying supine in bed with call bell within reach, bed alarm activated, and all needs met.   Therapy Documentation Precautions:  Precautions Precautions: Fall Precaution Comments: watch HR and SpO2 Restrictions Weight Bearing Restrictions: No  Therapy/Group: Individual Therapy   R Howerton-Davis 03/07/2020, 7:51 AM 

## 2020-03-08 ENCOUNTER — Inpatient Hospital Stay (HOSPITAL_COMMUNITY): Payer: PPO

## 2020-03-08 ENCOUNTER — Inpatient Hospital Stay (HOSPITAL_COMMUNITY): Payer: PPO | Admitting: Occupational Therapy

## 2020-03-08 ENCOUNTER — Inpatient Hospital Stay (HOSPITAL_COMMUNITY): Payer: PPO | Admitting: *Deleted

## 2020-03-08 LAB — URINALYSIS, ROUTINE W REFLEX MICROSCOPIC
Bilirubin Urine: NEGATIVE
Glucose, UA: NEGATIVE mg/dL
Ketones, ur: NEGATIVE mg/dL
Nitrite: POSITIVE — AB
Protein, ur: 100 mg/dL — AB
RBC / HPF: >50 RBC/hpf — ABNORMAL HIGH (ref 0–5)
Specific Gravity, Urine: 1.017 (ref 1.005–1.030)
WBC, UA: >50 WBC/hpf — ABNORMAL HIGH (ref 0–5)
pH: 7 (ref 5.0–8.0)

## 2020-03-08 LAB — CBC WITH DIFFERENTIAL/PLATELET
Abs Immature Granulocytes: 0.03 10*3/uL (ref 0.00–0.07)
Basophils Absolute: 0 10*3/uL (ref 0.0–0.1)
Basophils Relative: 0 %
Eosinophils Absolute: 0.1 10*3/uL (ref 0.0–0.5)
Eosinophils Relative: 1 %
HCT: 26.6 % — ABNORMAL LOW (ref 36.0–46.0)
Hemoglobin: 8.8 g/dL — ABNORMAL LOW (ref 12.0–15.0)
Immature Granulocytes: 0 %
Lymphocytes Relative: 7 %
Lymphs Abs: 0.7 10*3/uL (ref 0.7–4.0)
MCH: 37.6 pg — ABNORMAL HIGH (ref 26.0–34.0)
MCHC: 33.1 g/dL (ref 30.0–36.0)
MCV: 113.7 fL — ABNORMAL HIGH (ref 80.0–100.0)
Monocytes Absolute: 0.8 10*3/uL (ref 0.1–1.0)
Monocytes Relative: 8 %
Neutro Abs: 9.2 10*3/uL — ABNORMAL HIGH (ref 1.7–7.7)
Neutrophils Relative %: 84 %
Platelets: 449 10*3/uL — ABNORMAL HIGH (ref 150–400)
RBC: 2.34 MIL/uL — ABNORMAL LOW (ref 3.87–5.11)
RDW: 13.6 % (ref 11.5–15.5)
WBC: 10.9 10*3/uL — ABNORMAL HIGH (ref 4.0–10.5)
nRBC: 0 % (ref 0.0–0.2)

## 2020-03-08 LAB — BASIC METABOLIC PANEL
Anion gap: 9 (ref 5–15)
BUN: 6 mg/dL — ABNORMAL LOW (ref 8–23)
CO2: 25 mmol/L (ref 22–32)
Calcium: 8.8 mg/dL — ABNORMAL LOW (ref 8.9–10.3)
Chloride: 99 mmol/L (ref 98–111)
Creatinine, Ser: 0.51 mg/dL (ref 0.44–1.00)
GFR calc Af Amer: >60 mL/min (ref >60)
GFR calc non Af Amer: >60 mL/min (ref >60)
Glucose, Bld: 108 mg/dL — ABNORMAL HIGH (ref 70–99)
Potassium: 4.1 mmol/L (ref 3.5–5.1)
Sodium: 133 mmol/L — ABNORMAL LOW (ref 135–145)

## 2020-03-08 LAB — MAGNESIUM: Magnesium: 1.4 mg/dL — ABNORMAL LOW (ref 1.7–2.4)

## 2020-03-08 LAB — C DIFFICILE QUICK SCREEN W PCR REFLEX
C Diff antigen: POSITIVE — AB
C Diff interpretation: DETECTED
C Diff toxin: POSITIVE — AB

## 2020-03-08 MED ORDER — VANCOMYCIN 50 MG/ML ORAL SOLUTION
125.0000 mg | Freq: Four times a day (QID) | ORAL | Status: DC
  Administered 2020-03-08 – 2020-03-13 (×22): 125 mg via ORAL
  Filled 2020-03-08 (×23): qty 2.5

## 2020-03-08 NOTE — Progress Notes (Signed)
Dawn Beasley PHYSICAL MEDICINE & REHABILITATION PROGRESS NOTE   Subjective/Complaints:   Pt said her back is bothering her more today- it's so painful; and doesn't feel like can do therapy today due to "catch" in back.   Hurts so bad, "cannot walk".     ROS:  Pt denies SOB, abd pain, CP, N/V/C/D, and vision changes   Objective: CT THORACIC SPINE WO CONTRAST  Result Date: 03/07/2020 CLINICAL DATA:  Neurogenic bowel and bladder. History of spinal cord stimulator placement and lumbar surgery. EXAM: CT THORACIC AND LUMBAR SPINE WITHOUT CONTRAST TECHNIQUE: Multidetector CT imaging of the thoracic and lumbar spine was performed without contrast. Multiplanar CT image reconstructions were also generated. COMPARISON:  Lumbar CT myelogram 05/15/2016. Chest CTA 02/15/2020. FINDINGS: CT THORACIC SPINE FINDINGS Alignment: Exaggerated thoracic kyphosis. Trace retrolisthesis of C5 on C6. Vertebrae: No acute fracture or destructive osseous process. Spinal cord stimulator leads terminate to the right of midline in the dorsal spinal canal at T8-9. Paraspinal and other soft tissues: Aortic and coronary atherosclerosis. Overall improved lung aeration compared to the prior CTA with greatest remaining consolidation in the right upper lobe along the minor fissure. Underlying centrilobular emphysema. Disc levels: Moderate disc degeneration with disc space narrowing and degenerative spurring greatest in the midthoracic spine. Limited assessment of the spinal canal from T8-T10 due to streak artifact from the stimulator electrodes. No evidence of significant spinal stenosis elsewhere within limitations of non myelographic technique. No significant osseous neural foraminal stenosis. CT LUMBAR SPINE FINDINGS Segmentation: 5 lumbar type vertebrae. Alignment: Increased, mild S-shaped lumbar scoliosis. 2 mm anterolisthesis of L3 on L4. Vertebrae: No acute fracture or destructive osseous process. Paraspinal and other soft  tissues: Partially visualized Foley catheter in the bladder. Scattered fluid in the colon and rectum with recent history of frequent bowel movements. Abdominal aortic atherosclerosis without aneurysm. Disc levels: L1-2: Mild disc space narrowing. Disc bulging eccentric to the left and mild facet hypertrophy result in likely mild left lateral recess stenosis and mild left neural foraminal stenosis, mildly progressed from the prior myelogram. No spinal stenosis. L2-3: Severe asymmetric left-sided disc space narrowing with progressive degenerative endplate changes. Circumferential disc bulging, endplate spurring, disc space height loss, and mild facet hypertrophy result in mild spinal stenosis and mild left neural foraminal stenosis, stable to slightly progressed. L3-4: Unchanged moderate disc space narrowing. Previous laminectomies. Minimally increased anterolisthesis with bulging uncovered disc and severe facet hypertrophy result in stable to slight worsening of mild to moderate bilateral neural foraminal stenosis. No spinal stenosis. L4-5: Unchanged severe disc space narrowing with vacuum disc and degenerative endplate sclerosis. Previous laminectomies. Disc bulging, endplate spurring, disc space height loss, and moderate facet hypertrophy result in unchanged moderate right neural foraminal stenosis without spinal stenosis. L5-S1: Unchanged severe disc space narrowing with vacuum disc and degenerative endplate sclerosis. Disc bulging, endplate spurring, and disc space height loss result in mild bilateral neural foraminal stenosis without spinal stenosis, unchanged. IMPRESSION: 1. No acute osseous abnormality identified in the thoracic or lumbar spine. 2. Mild progression of advanced lumbar disc degeneration with mild chronic spinal stenosis at L2-3. 3. Mild-to-moderate multilevel neural foraminal stenosis in the lumbar spine as above. 4. Moderate thoracic disc degeneration without evidence of significant stenosis.  5. Spinal cord stimulator in place with leads terminating at T8-9. 6. Improved lung aeration compared to 02/15/2020 with residual consolidation greatest in the right upper lobe. 7. Aortic Atherosclerosis (ICD10-I70.0) and Emphysema (ICD10-J43.9). Electronically Signed   By: Sebastian Ache M.D.   On: 03/07/2020  19:59   CT LUMBAR SPINE WO CONTRAST  Result Date: 03/07/2020 CLINICAL DATA:  Neurogenic bowel and bladder. History of spinal cord stimulator placement and lumbar surgery. EXAM: CT THORACIC AND LUMBAR SPINE WITHOUT CONTRAST TECHNIQUE: Multidetector CT imaging of the thoracic and lumbar spine was performed without contrast. Multiplanar CT image reconstructions were also generated. COMPARISON:  Lumbar CT myelogram 05/15/2016. Chest CTA 02/15/2020. FINDINGS: CT THORACIC SPINE FINDINGS Alignment: Exaggerated thoracic kyphosis. Trace retrolisthesis of C5 on C6. Vertebrae: No acute fracture or destructive osseous process. Spinal cord stimulator leads terminate to the right of midline in the dorsal spinal canal at T8-9. Paraspinal and other soft tissues: Aortic and coronary atherosclerosis. Overall improved lung aeration compared to the prior CTA with greatest remaining consolidation in the right upper lobe along the minor fissure. Underlying centrilobular emphysema. Disc levels: Moderate disc degeneration with disc space narrowing and degenerative spurring greatest in the midthoracic spine. Limited assessment of the spinal canal from T8-T10 due to streak artifact from the stimulator electrodes. No evidence of significant spinal stenosis elsewhere within limitations of non myelographic technique. No significant osseous neural foraminal stenosis. CT LUMBAR SPINE FINDINGS Segmentation: 5 lumbar type vertebrae. Alignment: Increased, mild S-shaped lumbar scoliosis. 2 mm anterolisthesis of L3 on L4. Vertebrae: No acute fracture or destructive osseous process. Paraspinal and other soft tissues: Partially visualized Foley  catheter in the bladder. Scattered fluid in the colon and rectum with recent history of frequent bowel movements. Abdominal aortic atherosclerosis without aneurysm. Disc levels: L1-2: Mild disc space narrowing. Disc bulging eccentric to the left and mild facet hypertrophy result in likely mild left lateral recess stenosis and mild left neural foraminal stenosis, mildly progressed from the prior myelogram. No spinal stenosis. L2-3: Severe asymmetric left-sided disc space narrowing with progressive degenerative endplate changes. Circumferential disc bulging, endplate spurring, disc space height loss, and mild facet hypertrophy result in mild spinal stenosis and mild left neural foraminal stenosis, stable to slightly progressed. L3-4: Unchanged moderate disc space narrowing. Previous laminectomies. Minimally increased anterolisthesis with bulging uncovered disc and severe facet hypertrophy result in stable to slight worsening of mild to moderate bilateral neural foraminal stenosis. No spinal stenosis. L4-5: Unchanged severe disc space narrowing with vacuum disc and degenerative endplate sclerosis. Previous laminectomies. Disc bulging, endplate spurring, disc space height loss, and moderate facet hypertrophy result in unchanged moderate right neural foraminal stenosis without spinal stenosis. L5-S1: Unchanged severe disc space narrowing with vacuum disc and degenerative endplate sclerosis. Disc bulging, endplate spurring, and disc space height loss result in mild bilateral neural foraminal stenosis without spinal stenosis, unchanged. IMPRESSION: 1. No acute osseous abnormality identified in the thoracic or lumbar spine. 2. Mild progression of advanced lumbar disc degeneration with mild chronic spinal stenosis at L2-3. 3. Mild-to-moderate multilevel neural foraminal stenosis in the lumbar spine as above. 4. Moderate thoracic disc degeneration without evidence of significant stenosis. 5. Spinal cord stimulator in place  with leads terminating at T8-9. 6. Improved lung aeration compared to 02/15/2020 with residual consolidation greatest in the right upper lobe. 7. Aortic Atherosclerosis (ICD10-I70.0) and Emphysema (ICD10-J43.9). Electronically Signed   By: Sebastian AcheAllen  Grady M.D.   On: 03/07/2020 19:59   Recent Labs    03/07/20 0507 03/08/20 0657  WBC 12.0* 10.9*  HGB 7.9* 8.8*  HCT 24.2* 26.6*  PLT 374 449*   Recent Labs    03/07/20 0507 03/08/20 0657  NA 132* 133*  K 3.3* 4.1  CL 98 99  CO2 24 25  GLUCOSE 92 108*  BUN  7* 6*  CREATININE 0.41* 0.51  CALCIUM 8.5* 8.8*    Intake/Output Summary (Last 24 hours) at 03/08/2020 1022 Last data filed at 03/08/2020 0742 Gross per 24 hour  Intake 460 ml  Output 725 ml  Net -265 ml     Physical Exam: Vital Signs Blood pressure (!) 113/56, pulse 89, temperature 98.5 F (36.9 C), resp. rate 16, height 5\' 2"  (1.575 m), weight 47.4 kg, SpO2 95 %. Physical Exam Constitutional: pt sitting up in bed; done with breakfast, NAD HENT: conjugate gaze Cardiovascular:RRR_ no JVD Respiratory: CTA b/l , NT, ND, (+)BS - hyperactive BS Musculoskeletal:  General: No edema.Normal range of motion.  Cervical back: Normal range of motion.  Neurological: Ox3  Motor upper ext 4/5 prox to distal. LE: 3+ to 4-/5 prox to 4+ distally with ADF/PF. No sensory dysfunction. Skin: Skin iswarm.No rashnoted. No erythema.  Psychiatric: appropriate  Assessment/Plan: 1. Functional deficits secondary to debility from acute respiratory failure with hypoxiaextubated 02/13/2020/pulmonary emboli/acute metabolic encephalopathywhich require 3+ hours per day of interdisciplinary therapy in a comprehensive inpatient rehab setting.  Physiatrist is providing close team supervision and 24 hour management of active medical problems listed below.  Physiatrist and rehab team continue to assess barriers to discharge/monitor patient progress toward functional and medical  goals  Care Tool:  Bathing    Body parts bathed by patient: Right arm, Left arm, Chest, Abdomen, Front perineal area, Buttocks, Right upper leg, Left upper leg, Right lower leg, Left lower leg, Face         Bathing assist Assist Level: Supervision/Verbal cueing     Upper Body Dressing/Undressing Upper body dressing   What is the patient wearing?: Pull over shirt    Upper body assist Assist Level: Set up assist    Lower Body Dressing/Undressing Lower body dressing      What is the patient wearing?: Pants     Lower body assist Assist for lower body dressing: Minimal Assistance - Patient > 75%     Toileting Toileting    Toileting assist Assist for toileting: Minimal Assistance - Patient > 75%     Transfers Chair/bed transfer  Transfers assist     Chair/bed transfer assist level: Supervision/Verbal cueing     Locomotion Ambulation   Ambulation assist      Assist level: Supervision/Verbal cueing Assistive device: Rollator Max distance: 120   Walk 10 feet activity   Assist     Assist level: Supervision/Verbal cueing Assistive device: No Device   Walk 50 feet activity   Assist    Assist level: Supervision/Verbal cueing Assistive device: Rollator    Walk 150 feet activity   Assist Walk 150 feet activity did not occur: Safety/medical concerns         Walk 10 feet on uneven surface  activity   Assist     Assist level: Minimal Assistance - Patient > 75% Assistive device: Hand held assist   Wheelchair     Assist Will patient use wheelchair at discharge?: No             Wheelchair 50 feet with 2 turns activity    Assist            Wheelchair 150 feet activity     Assist          Blood pressure (!) 113/56, pulse 89, temperature 98.5 F (36.9 C), resp. rate 16, height 5\' 2"  (1.575 m), weight 47.4 kg, SpO2 95 %.  Medical Problem List and Plan: 1.Decreased functional mobility  with altered mental  statussecondary to acute respiratory failure with hypoxiaextubated 02/13/2020/pulmonary emboli/acute metabolic encephalopathy -patient may shower -ELOS/Goals: 10-12 days, supervision to mod I goals  -Continue CIR PT, OT, SLP 2. Antithrombotics: -DVT/anticoagulation:Eliquisfor pulmonary emboli -antiplatelet therapy: n/a 3. Pain Management/chroniclow backpain:pt uses her oxycodone 4x daily at home on a schedule. Will schedule her doses at 0700 and 1200 daily, and leave other doses up to her in the PM.  -has spinal cord stimulator per Dr. Shon Baton, Dr. Ethelene Hal manages her chronic pain  4/17- will schedule tramadol 50 mg q6 hours for additional pain control  4/18- is working a little better for her- explained I cannot insist pain doctor do tramadol, but can con't here.  4. Mood/panic disorder:Xanax 0.5 mg twice daily as needed -pt was anxious about coming to rehab, people she might work with. Reassured her that we will take care and that she will do well.  4/18- feeling better about rehab -antipsychotic agents: N/A 5. Neuropsych: This patientiscapable of making decisions on herown behalf. 6. Skin/Wound Care:Routine skin checks 7. Fluids/Electrolytes/Nutrition:Routine in and outs with follow-up chemistries 9. AKI. Resolved. Follow-up chemistries on Monday  4/17- Cr looks good- no dehydration, elevated labs. 4/19: continues to be well controlled.  10. Acute on chronic diastolic congestive heart failure. Lasix 20 mg daily. Monitor for any signs of fluid overload. Check daily weights   Filed Weights   03/06/20 0406 03/07/20 0411 03/08/20 0451  Weight: 48.8 kg 49.1 kg 47.4 kg     4/20- weights stable- con't regimen down to 48.8 kg  4/22- Weight 47.4 kg- stable- con't meds 11. Pancytopenia. Follow-up labs on Monday 12. Hypertension. Hydralazine 25 mg every 8 hours. Monitor with increased mobility 13.  Tobacco and alcohol abuse. Continue NicoDerm patch. Provide counseling 14. Urinary retention. Urecholine 5 mg 3 times daily. Check PVR -oob to void  4/17- nursing notes document foley catheter- will check into this  4/18- replaced foley catheter this AM- hers wasn't working- will see if can remove in next few days.   4/20- per nurse, foley was due to a traumatic cath- urology says keep it for ~ 2-3 weeks- will need to go home with it and f/u with Urology.   4/22- discussed with pt- will need to go home with it- had so many in/out caths- sounds like ended up with false passage. Needs Urology f/u.   4/21- pt wants to get foley out- but will see if can void/check after CT of lumbar/throacic spine-  15. GERD. Protonix 16. Bowel incontinence  4/17- will order bowel program nightly after dinner- since works best after a meal-with dig stim and suppository. Since pt has NO control/feeling of BMs- not sure why. Incontinent documented.    4/18- pt had 2 BMs after bowel program last night- 10pm and 11:30 pm-   -not sure WHY having loss of bowel sensation/control- will d/w team.  4/21- refused bowel program 2x in last 2 days- getting CT of thoracic and lumbar spine to see why might need bowel program/   SCI? Also ordered Cdiff since 5 loose BM sin last 24 hours.  4/22- C Diff (+)- have also called Dr Shon Baton (and previously [facility] consult who referred me to Dr Shon Baton) and l/m to discuss pt's care- CTs look like nothing acute, but cannot get MRIs.   17. Hypokalemia: K+ 3.4. Supplement with K+ and repeat BMP 4/20.   4/20- K+ up to 3.7  4/21- Give KCL 40 mEq x2 and recheck in AM  4/22- K+ 4.1- con't to follow  LOS: 6 days A FACE TO FACE EVALUATION WAS PERFORMED  Fleta Borgeson 03/08/2020, 10:22 AM

## 2020-03-08 NOTE — Progress Notes (Signed)
Patient refused bowel program last night and tonight. Patient has loose stool sat this time

## 2020-03-08 NOTE — Progress Notes (Signed)
Occupational Therapy Session Note  Patient Details  Name: Dawn Beasley MRN: [id number] Date of Birth: 03/19/1951  Today's Date: 03/08/2020 OT Individual Time: 1005-1100 OT Individual Time Calculation (min): 55 min    Short Term Goals: Week 1:  OT Short Term Goal 1 (Week 1): Patient will complete LB dressing with supervision. OT Short Term Goal 2 (Week 1): Patient will complete toilet transfers with supervision and min cues for technique. OT Short Term Goal 3 (Week 1): Patient will engage in functional activity in standing for 3 minutes without rest break to increase functional endurance for ADLs.  Skilled Therapeutic Interventions/Progress Updates:    Patient in bed, states that she had a bad night and is currently in significant pain.  She is agreeable to complete bed level self care activities - UB bathing completed with set up, oral care set up, UB dressing min a.  reviewed discharge plan and options for supervision in home environment.  Completed repositioning and light mobility activities for pain management with good results, offered ice but she declined.  LB/UB AROM exercises completed at bed level with slow rate to her tolerance.  She remained in bed at close of session - she states that she feels better but does not want to get OOB.  Bed alarm set and call bell in reach.    Therapy Documentation Precautions:  Precautions Precautions: Fall Precaution Comments: watch HR and SpO2 Restrictions Weight Bearing Restrictions: No  Therapy/Group: Individual Therapy  Barrie Lyme 03/08/2020, 7:31 AM

## 2020-03-08 NOTE — Progress Notes (Signed)
Physical Therapy Session Note  Patient Details  Name: Dawn Beasley MRN: [id number] Date of Birth: [birth year]  Today's Date: 03/08/2020 PT Individual Time: 1345-1445 PT Individual Time Calculation (min): 60 min   Short Term Goals: Week 1:  PT Short Term Goal 1 (Week 1): Pt will perform sit<>stands with CGA PT Short Term Goal 2 (Week 1): Pt will perform stand pivot transfers with CGA PT Short Term Goal 3 (Week 1): Pt will ambulate at least 176ft using LRAD with CGA PT Short Term Goal 4 (Week 1): Pt will ascend/descend 4 steps using 1HR and min assist Week 2:    Week 3:     Skilled Therapeutic Interventions/Progress Updates:     PAIN 7/10 lower back, therex and stretching to address   Pt initially supine and agreeable to treatment session with focus on core stabili;ty.  Declined sitting or getting oob due to severe back pain which she attributes to new Cdiff/excessive BMs throughout night.  Therex: Pt instructed w/and performed the follownig exercises to address LBP and core strengthening  Transverse abdominis contractions 2 x10 Transverse abdominis + single leg marches 2 c 10 Clamshells 2 x15 Glut stretches 4x each w/15 sec hold at end range Hamstring stretches 2x30 each Pelvic tilts x 15 Lower trunk rotation x 15  Repeated exercises second set as described above.  Pt instructed w/proper breathing technique to avoid "holding breath" during exercises and required frequent cuing for this.    Educated pt re: pain/spasm cycle and purpose of therex to break cycle.  Also discussed importance of core strength for lumbar stabilization.  Pt requesting written HEP and agreed to FU w/primary for completion w/this so all appropriate exercises included in final HEP. Pt left supine w/rails up x 3, alarm set, bed in lowest position, and needs in reach.    Therapy Documentation Precautions:  Precautions Precautions: Fall Precaution Comments: watch HR and SpO2 Restrictions Weight Bearing  Restrictions: No   Therapy/Group: Individual Therapy  Rada Hay, PT   Shearon Balo 03/08/2020, 4:00 PM

## 2020-03-08 NOTE — Progress Notes (Signed)
Patient Stool sample positive for C-dif. Enteric precautions in place. Riley Lam NP notified. New order for PO vancomycin

## 2020-03-08 NOTE — Progress Notes (Signed)
Physical Therapy Session Note  Patient Details  Name: Dawn Beasley MRN: [id number] Date of Birth: 1951/09/18  Today's Date: 03/08/2020 PT Individual Time: 0800-0900 PT Individual Time Calculation (min): 60 min   Short Term Goals: Week 1:  PT Short Term Goal 1 (Week 1): Pt will perform sit<>stands with CGA PT Short Term Goal 2 (Week 1): Pt will perform stand pivot transfers with CGA PT Short Term Goal 3 (Week 1): Pt will ambulate at least 159ft using LRAD with CGA PT Short Term Goal 4 (Week 1): Pt will ascend/descend 4 steps using 1HR and min assist  Skilled Therapeutic Interventions/Progress Updates:     Patient in bed with MD in room upon PT arrival. Patient alert and agreeable to PT session. Patient reported 7-8/10 lower L back pain during session, RN made aware, patient pre-medicated prior to session. PT provided repositioning, rest breaks, and distraction as pain interventions throughout session. Patient reported that she was up to the [facility] several times last night with frequent BM's and felt her back "catching" after with increased pain. She stated that she did not think she could get OOB today. Focused session on pain management with manual therapy and relaxation techniques, LE and core strengthening, and easing into functional mobility as tolerated. Mobility significantly limited by pain today.  Manual Therapy: Performed soft tissue mobilization and trigger point release to erector spinae and L quadratus lumborum x10 min Performed manual B knees to chest to stretch low back into flexion, performed manual rocking forward/back and side to side x4  Therapeutic Exercise: Patient performed the following exercises with verbal and tactile cues for proper technique. -B heel slides with abdominal drawing in maneuver 2x10 -alternating marching in supine with abdominal drawing in maneuver 2x10 -lower trunk rotation in supine with abdominal drawing in maneuver 2x10 with ROM to tolerance,  increased muscle spasm to the L, improved with decreased ROM 2x10 -seated B LAQ 2x10 -seated alternating marching 2x10  Therapeutic Activity: Bed Mobility: Patient performed supine to/from sit with min A for trunk support to reduce pain with mobility. Provided verbal cues for performing log rolling. Despite therapist cues and manual facilitation, patient leaned her trunk back from sitting to lift LE's into the bed and collapsed back on the bed causing increased back pain after. Provided cues for relaxation techniques and diaphragmatic breathing and pain reduced to starting levels.   Transfers: Patient performed sit to/from stand x1 with min A for boosting up to reduce back pain using a RW for UE support. Provided verbal cues for reduced twisting and bending for reduced back pain with mobility. Patient only able to tolerate <30 sec in standing before requesting to lie back down due to pain.  Patient in bed at end of session with breaks locked, bed alarm set, and all needs within reach.    Therapy Documentation Precautions:  Precautions Precautions: Fall Precaution Comments: watch HR and SpO2 Restrictions Weight Bearing Restrictions: No    Therapy/Group: Individual Therapy  Ocie Tino L Dionis Autry PT, DPT  03/08/2020, 12:20 PM

## 2020-03-08 NOTE — Evaluation (Signed)
Recreational Therapy Assessment and Plan  Patient Details  Name: Dawn Beasley MRN: [id number] Date of Birth: 1951/01/22 Today's Date: 03/08/2020  Rehab Potential:   Good ELOS:   d/c 4/27  Assessment  Problem List:      Patient Active Problem List   Diagnosis Date Noted  . Sacral decubitus ulcer, stage II ([place]) 03/02/2020  . Acute metabolic encephalopathy 85/88/5027  . Debility 03/02/2020  . Multiple subsegmental pulmonary emboli without acute cor pulmonale ([place])   . Delirium   . Pancytopenia ([place]) 02/12/2020  . Sepsis ([place]) 02/12/2020  . Aspiration pneumonia ([place]) 02/12/2020  . Acute on chronic respiratory failure with hypoxia ([place]) 02/12/2020  . Hyponatremia 02/12/2020  . AKI (acute kidney injury) ([place]) 02/12/2020  . Dehydration 02/12/2020  . Rhabdomyolysis 02/12/2020  . Elevated troponin 02/12/2020  . Respiratory failure ([place]) 02/12/2020  . Abnormal CT scan, colon 02/06/2020  . Nausea and vomiting 02/06/2020  . Essential hypertension   . 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 Encephalopathy acute 12/23/2016  . Transient alteration of awareness 12/23/2016  . Acute hyponatremia   . Speech disturbance   . Benzodiazepine withdrawal with complication ([place])   . Other chronic pain 12/06/2015  . Lumbar spinal stenosis 04/12/2014   Past Medical History:      Past Medical History:  Diagnosis Date  . AAA (abdominal aortic aneurysm) ([place])   . Anxiety   . Arthritis   . Back pain   . Cervical disc herniation   . Depression   . 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 Hypercholesteremia   . Hypertension   . Panic disorder   . Stenosis, spinal, lumbar    Past Surgical History:       Past Surgical History:  Procedure Laterality Date  . APPENDECTOMY    . BACK SURGERY    . CARPAL TUNNEL RELEASE    . LUMBAR LAMINECTOMY/DECOMPRESSION MICRODISCECTOMY N/A 04/12/2014   Procedure: MICRO LUMBAR DECOMPRESSION LUMBAR THREE TO FOUR,LUMBAR FOUR TO FIVE; Surgeon: Johnn Hai, MD;  Location: [facility]; Service: Orthopedics; Laterality: N/A;  . MAXOFACIAL SURGERY    . SPINAL CORD STIMULATOR INSERTION N/A 12/06/2015   Procedure: LUMBAR SPINAL CORD STIMULATOR INSERTION; Surgeon: Melina Schools, MD; Location: [place]; Service: Orthopedics; Laterality: N/A;   Assessment & Plan  Clinical Impression: Patient is a 69 y.o. year old right-handed female with history of thoracic aortic aneurysm, chronic gastric dysmotility, hyperlipidemia, tobacco/alcohol abuse, hypertension, panic disorder, chronic back pain lumbar spinal cord stimulator insertion 12/06/2015 per Dr. Rolena Infante. Per chart review patient lives with spouse independent prior to admission a bit sedentary. Two-level home bed and bath on main level 2 steps to entry. Husband can assist as needed. Presented 02/11/2020 after witnessed syncopal event. Patient reportedly had a recent endoscopy colonoscopy on 02/06/2020 with findings of a single localized nonbleeding erosion found in the cecum as well as colitis and reactive gastropathy. No stigmata of recent bleeding was seen. Patient had episode of vomiting was orthostatic received bolus of IV fluids. Noted oxygen saturation 78% on room air started on 4 L.. Admission chemistry with hemoglobin 8.6, WBC 1.3, platelet 141,000, lactic acid 2.3, sodium 127, BUN 50, creatinine 1.69, D-dimer 2.15, troponin high-sensitivity 523, CK 1746, blood cultures no growth to date. Cranial CT scan showed no acute hemorrhage. Small vessel ischemic changes throughout the periventricular white matter left basal ganglia, likely chronic. CT angiogram of the chest showed acute pulmonary embolus within the segmental branch of the right lower lobe pulmonary artery. Interval development of bilateral  pleural effusions left greater than right. Stable appearance of ascending thoracic aortic aneurysm 4.1 cm. Patient did require intubation for airway protection and extubated 02/13/2020. She was placed on Eliquis after findings of pulmonary  emboli. Subsequent echocardiogram did not show any heart strain. She remained in the ICU for acute metabolic encephalopathy/septic shock. She was transferred out of the ICU 02/24/2020. Hospital course complicated by mild acute on chronic diastolic congestive heart failure started on IV Lasix with good diuresis. Patient had been maintained on broad-spectrum antibiotics x10 days and since discontinued. Her renal function has steadily improved after gentle IV fluids latest creatinine 0.42. Pancytopenia hemoglobin 8.1, WBC improved 8900, RBC 2.14. Her diet has been advanced to regular consistency. Her mental status has continued to improve and a follow-up cranial CT scan was completed 02/15/2020 again negative for acute changes suspect acute metabolic encephalopathy. Therapy evaluations completed and patient was admitted for a comprehensive rehab program. Patient transferred to CIR on 03/02/2020.   Pt presents with decreased activity tolerance, decreased functional mobility, decreased balance, stress/anxiety Limiting pt's independence with leisure/community pursuits.  Met with pt to discuss TR services, use of leisure time, activity modifications and relaxation.  Pt states she takes meds for anxiety which really helps.  Reviewed deep breathing and pt demonstrated technique.    Plan Due to LRT availability, No further TR as pt is expected to discharge home 4/27  Recommendations for other services: Neuropsych  Discharge Criteria: Patient will be discharged from TR if patient refuses treatment 3 consecutive times without medical reason.  If treatment goals not met, if there is a change in medical status, if patient makes no progress towards goals or if patient is discharged from hospital.  The above assessment, treatment plan, treatment alternatives and goals were discussed and mutually agreed upon: by patient  [place] 03/08/2020, 3:04 PM

## 2020-03-09 ENCOUNTER — Inpatient Hospital Stay (HOSPITAL_COMMUNITY): Payer: PPO | Admitting: Occupational Therapy

## 2020-03-09 ENCOUNTER — Inpatient Hospital Stay (HOSPITAL_COMMUNITY): Payer: PPO | Admitting: Physical Therapy

## 2020-03-09 LAB — URINE CULTURE

## 2020-03-09 MED ORDER — MAGNESIUM OXIDE 400 (241.3 MG) MG PO TABS
400.0000 mg | ORAL_TABLET | Freq: Two times a day (BID) | ORAL | Status: DC
  Administered 2020-03-09 – 2020-03-13 (×9): 400 mg via ORAL
  Filled 2020-03-09 (×9): qty 1

## 2020-03-09 NOTE — Progress Notes (Signed)
Occupational Therapy Session Note  Patient Details  Name: Dawn Beasley MRN: [id number] Date of Birth: 1951/08/23  Today's Date: 03/09/2020 OT Individual Time: 1101-1155 OT Individual Time Calculation (min): 54 min   OT Individual Time: 1335-1345 OT Individual Time Calculation (min): 10 min   Short Term Goals: Week 1:  OT Short Term Goal 1 (Week 1): Patient will complete LB dressing with supervision. OT Short Term Goal 2 (Week 1): Patient will complete toilet transfers with supervision and min cues for technique. OT Short Term Goal 3 (Week 1): Patient will engage in functional activity in standing for 3 minutes without rest break to increase functional endurance for ADLs.  Skilled Therapeutic Interventions/Progress Updates:  Session 1: Patient met lying supine in bed in agreement with OT treatment session. 7/10 back pain at rest. Patient notes bout of loose stool during earlier therapy session with request for bathing seated EOB secondary to fatigue and pain. OT provided set-up of basin/soap/clothing with patient able to complete UB bathing with set-up assist and LB bathing in sitting/standing with CGA. Patient aware of need for rest breaks with increased SOB. Therapeutic exercise for BUE 2 set x 15 reps each without weight and focus on UE strengthening in prep for ALDs/IADLs with patient requesting to complete exercises in supine. Return to supine with Mod I. Therapy session concluded with patient lying supine in bed with call bell within reach, bed alarm activated, and all needs met.    Session 2: Patient met lying supine in bed noting recent episode of bowel incontinence secondary to C-diff. Patient declined bed level, EOB, and OOB activity secondary to fatigue. OT exited room with patient lying supine in bed with bed alarm activated and all needs met. Patient missed 65 minutes of skilled OT treatment session.   Therapy Documentation Precautions:  Precautions Precautions: Fall Precaution  Comments: watch HR and SpO2 Restrictions Weight Bearing Restrictions: No General: General OT Amount of Missed Time: 65 Minutes  Therapy/Group: Individual Therapy  Jaquin Coy R [place] 03/09/2020, 8:02 AM

## 2020-03-09 NOTE — Progress Notes (Signed)
[facility] PHYSICAL MEDICINE & REHABILITATION PROGRESS NOTE   Subjective/Complaints:   Reports before came to hospital, peed "all the time" every time walked by bathroom- then, when cam ein, couldn't void- was retaining.   Back feels better today- notes family training is Sunday.  Off tomorrow for therapy  LBM last night- ~ 10pm- only time yesterday- refused bowel program yesterday and day prior, of note.   ROS:  Pt denies SOB, abd pain, CP, N/V/C/D, and vision changes   Objective: CT THORACIC SPINE WO CONTRAST  Result Date: 03/07/2020 CLINICAL DATA:  Neurogenic bowel and bladder. History of spinal cord stimulator placement and lumbar surgery. EXAM: CT THORACIC AND LUMBAR SPINE WITHOUT CONTRAST TECHNIQUE: Multidetector CT imaging of the thoracic and lumbar spine was performed without contrast. Multiplanar CT image reconstructions were also generated. COMPARISON:  Lumbar CT myelogram 05/15/2016. Chest CTA 02/15/2020. FINDINGS: CT THORACIC SPINE FINDINGS Alignment: Exaggerated thoracic kyphosis. Trace retrolisthesis of C5 on C6. Vertebrae: No acute fracture or destructive osseous process. Spinal cord stimulator leads terminate to the right of midline in the dorsal spinal canal at T8-9. Paraspinal and other soft tissues: Aortic and coronary atherosclerosis. Overall improved lung aeration compared to the prior CTA with greatest remaining consolidation in the right upper lobe along the minor fissure. Underlying centrilobular emphysema. Disc levels: Moderate disc degeneration with disc space narrowing and degenerative spurring greatest in the midthoracic spine. Limited assessment of the spinal canal from T8-T10 due to streak artifact from the stimulator electrodes. No evidence of significant spinal stenosis elsewhere within limitations of non myelographic technique. No significant osseous neural foraminal stenosis. CT LUMBAR SPINE FINDINGS Segmentation: 5 lumbar type vertebrae. Alignment: Increased,  mild S-shaped lumbar scoliosis. 2 mm anterolisthesis of L3 on L4. Vertebrae: No acute fracture or destructive osseous process. Paraspinal and other soft tissues: Partially visualized Foley catheter in the bladder. Scattered fluid in the colon and rectum with recent history of frequent bowel movements. Abdominal aortic atherosclerosis without aneurysm. Disc levels: L1-2: Mild disc space narrowing. Disc bulging eccentric to the left and mild facet hypertrophy result in likely mild left lateral recess stenosis and mild left neural foraminal stenosis, mildly progressed from the prior myelogram. No spinal stenosis. L2-3: Severe asymmetric left-sided disc space narrowing with progressive degenerative endplate changes. Circumferential disc bulging, endplate spurring, disc space height loss, and mild facet hypertrophy result in mild spinal stenosis and mild left neural foraminal stenosis, stable to slightly progressed. L3-4: Unchanged moderate disc space narrowing. Previous laminectomies. Minimally increased anterolisthesis with bulging uncovered disc and severe facet hypertrophy result in stable to slight worsening of mild to moderate bilateral neural foraminal stenosis. No spinal stenosis. L4-5: Unchanged severe disc space narrowing with vacuum disc and degenerative endplate sclerosis. Previous laminectomies. Disc bulging, endplate spurring, disc space height loss, and moderate facet hypertrophy result in unchanged moderate right neural foraminal stenosis without spinal stenosis. L5-S1: Unchanged severe disc space narrowing with vacuum disc and degenerative endplate sclerosis. Disc bulging, endplate spurring, and disc space height loss result in mild bilateral neural foraminal stenosis without spinal stenosis, unchanged. IMPRESSION: 1. No acute osseous abnormality identified in the thoracic or lumbar spine. 2. Mild progression of advanced lumbar disc degeneration with mild chronic spinal stenosis at L2-3. 3.  Mild-to-moderate multilevel neural foraminal stenosis in the lumbar spine as above. 4. Moderate thoracic disc degeneration without evidence of significant stenosis. 5. Spinal cord stimulator in place with leads terminating at T8-9. 6. Improved lung aeration compared to 02/15/2020 with residual consolidation greatest in the right  upper lobe. 7. Aortic Atherosclerosis (ICD10-I70.0) and Emphysema (ICD10-J43.9). Electronically Signed   By: Sebastian AcheAllen  Grady M.D.   On: 03/07/2020 19:59   CT LUMBAR SPINE WO CONTRAST  Result Date: 03/07/2020 CLINICAL DATA:  Neurogenic bowel and bladder. History of spinal cord stimulator placement and lumbar surgery. EXAM: CT THORACIC AND LUMBAR SPINE WITHOUT CONTRAST TECHNIQUE: Multidetector CT imaging of the thoracic and lumbar spine was performed without contrast. Multiplanar CT image reconstructions were also generated. COMPARISON:  Lumbar CT myelogram 05/15/2016. Chest CTA 02/15/2020. FINDINGS: CT THORACIC SPINE FINDINGS Alignment: Exaggerated thoracic kyphosis. Trace retrolisthesis of C5 on C6. Vertebrae: No acute fracture or destructive osseous process. Spinal cord stimulator leads terminate to the right of midline in the dorsal spinal canal at T8-9. Paraspinal and other soft tissues: Aortic and coronary atherosclerosis. Overall improved lung aeration compared to the prior CTA with greatest remaining consolidation in the right upper lobe along the minor fissure. Underlying centrilobular emphysema. Disc levels: Moderate disc degeneration with disc space narrowing and degenerative spurring greatest in the midthoracic spine. Limited assessment of the spinal canal from T8-T10 due to streak artifact from the stimulator electrodes. No evidence of significant spinal stenosis elsewhere within limitations of non myelographic technique. No significant osseous neural foraminal stenosis. CT LUMBAR SPINE FINDINGS Segmentation: 5 lumbar type vertebrae. Alignment: Increased, mild S-shaped lumbar  scoliosis. 2 mm anterolisthesis of L3 on L4. Vertebrae: No acute fracture or destructive osseous process. Paraspinal and other soft tissues: Partially visualized Foley catheter in the bladder. Scattered fluid in the colon and rectum with recent history of frequent bowel movements. Abdominal aortic atherosclerosis without aneurysm. Disc levels: L1-2: Mild disc space narrowing. Disc bulging eccentric to the left and mild facet hypertrophy result in likely mild left lateral recess stenosis and mild left neural foraminal stenosis, mildly progressed from the prior myelogram. No spinal stenosis. L2-3: Severe asymmetric left-sided disc space narrowing with progressive degenerative endplate changes. Circumferential disc bulging, endplate spurring, disc space height loss, and mild facet hypertrophy result in mild spinal stenosis and mild left neural foraminal stenosis, stable to slightly progressed. L3-4: Unchanged moderate disc space narrowing. Previous laminectomies. Minimally increased anterolisthesis with bulging uncovered disc and severe facet hypertrophy result in stable to slight worsening of mild to moderate bilateral neural foraminal stenosis. No spinal stenosis. L4-5: Unchanged severe disc space narrowing with vacuum disc and degenerative endplate sclerosis. Previous laminectomies. Disc bulging, endplate spurring, disc space height loss, and moderate facet hypertrophy result in unchanged moderate right neural foraminal stenosis without spinal stenosis. L5-S1: Unchanged severe disc space narrowing with vacuum disc and degenerative endplate sclerosis. Disc bulging, endplate spurring, and disc space height loss result in mild bilateral neural foraminal stenosis without spinal stenosis, unchanged. IMPRESSION: 1. No acute osseous abnormality identified in the thoracic or lumbar spine. 2. Mild progression of advanced lumbar disc degeneration with mild chronic spinal stenosis at L2-3. 3. Mild-to-moderate multilevel neural  foraminal stenosis in the lumbar spine as above. 4. Moderate thoracic disc degeneration without evidence of significant stenosis. 5. Spinal cord stimulator in place with leads terminating at T8-9. 6. Improved lung aeration compared to 02/15/2020 with residual consolidation greatest in the right upper lobe. 7. Aortic Atherosclerosis (ICD10-I70.0) and Emphysema (ICD10-J43.9). Electronically Signed   By: Sebastian AcheAllen  Grady M.D.   On: 03/07/2020 19:59   Recent Labs    03/07/20 0507 03/08/20 0657  WBC 12.0* 10.9*  HGB 7.9* 8.8*  HCT 24.2* 26.6*  PLT 374 449*   Recent Labs    03/07/20 0507 03/08/20 [id number]  NA 132* 133*  K 3.3* 4.1  CL 98 99  CO2 24 25  GLUCOSE 92 108*  BUN 7* 6*  CREATININE 0.41* 0.51  CALCIUM 8.5* 8.8*    Intake/Output Summary (Last 24 hours) at 03/09/2020 0836 Last data filed at 03/09/2020 0504 Gross per 24 hour  Intake 240 ml  Output 1800 ml  Net -1560 ml     Physical Exam: Vital Signs Blood pressure 112/77, pulse (!) 105, temperature 98.9 F (37.2 C), temperature source Oral, resp. rate 17, height 5\' 2"  (1.575 m), weight 48.1 kg, SpO2 93 %. Physical Exam Constitutional: pt sitting up in bed; appropriate, refusing bowel program, NAD HENT: conjugate gaze Cardiovascular:mildly tachycardic- regular rhythm Respiratory: CTA b/l , NT, ND, (+)BS  Musculoskeletal:  General: No edema.Normal range of motion.  Cervical back: Normal range of motion.  Neurological: OX3 Motor upper ext 4/5 prox to distal. LE: 3+ to 4-/5 prox to 4+ distally with ADF/PF. No sensory dysfunction. Skin: Skin iswarm.No rashnoted. No erythema.  Psychiatric: Overall appropriate  Assessment/Plan: 1. Functional deficits secondary to debility from acute respiratory failure with hypoxiaextubated 02/13/2020/pulmonary emboli/acute metabolic encephalopathywhich require 3+ hours per day of interdisciplinary therapy in a comprehensive inpatient rehab setting.  Physiatrist is  providing close team supervision and 24 hour management of active medical problems listed below.  Physiatrist and rehab team continue to assess barriers to discharge/monitor patient progress toward functional and medical goals  Care Tool:  Bathing    Body parts bathed by patient: Right arm, Left arm, Chest, Abdomen, Front perineal area, Buttocks, Right upper leg, Left upper leg, Right lower leg, Left lower leg, Face         Bathing assist Assist Level: Supervision/Verbal cueing     Upper Body Dressing/Undressing Upper body dressing   What is the patient wearing?: Pull over shirt    Upper body assist Assist Level: Minimal Assistance - Patient > 75%    Lower Body Dressing/Undressing Lower body dressing      What is the patient wearing?: Pants     Lower body assist Assist for lower body dressing: Minimal Assistance - Patient > 75%     Toileting Toileting    Toileting assist Assist for toileting: Minimal Assistance - Patient > 75%     Transfers Chair/bed transfer  Transfers assist     Chair/bed transfer assist level: Supervision/Verbal cueing     Locomotion Ambulation   Ambulation assist      Assist level: Supervision/Verbal cueing Assistive device: Rollator Max distance: 120   Walk 10 feet activity   Assist     Assist level: Supervision/Verbal cueing Assistive device: No Device   Walk 50 feet activity   Assist    Assist level: Supervision/Verbal cueing Assistive device: Rollator    Walk 150 feet activity   Assist Walk 150 feet activity did not occur: Safety/medical concerns         Walk 10 feet on uneven surface  activity   Assist     Assist level: Minimal Assistance - Patient > 75% Assistive device: Hand held assist   Wheelchair     Assist Will patient use wheelchair at discharge?: No             Wheelchair 50 feet with 2 turns activity    Assist            Wheelchair 150 feet activity      Assist          Blood pressure 112/77, pulse (!) 105,  temperature 98.9 F (37.2 C), temperature source Oral, resp. rate 17, height 5\' 2"  (1.575 m), weight 48.1 kg, SpO2 93 %.  Medical Problem List and Plan: 1.Decreased functional mobility with altered mental statussecondary to acute respiratory failure with hypoxiaextubated 02/13/2020/pulmonary emboli/acute metabolic encephalopathy  4/23- have attempted to call Dr 5/23 about neurogenic bowel and bladder- CT shows nothing that would explain that.  -patient may shower -ELOS/Goals: 10-12 days, supervision to mod I goals  -Continue CIR PT, OT, SLP 2. Antithrombotics: -DVT/anticoagulation:Eliquisfor pulmonary emboli -antiplatelet therapy: n/a 3. Pain Management/chroniclow backpain:pt uses her oxycodone 4x daily at home on a schedule. Will schedule her doses at 0700 and 1200 daily, and leave other doses up to her in the PM.  -has spinal cord stimulator per Dr. Shon Baton, Dr. Shon Baton manages her chronic pain  4/17- will schedule tramadol 50 mg q6 hours for additional pain control  4/18- is working a little better for her- explained I cannot insist pain doctor do tramadol, but can con't here.  4. Mood/panic disorder:Xanax 0.5 mg twice daily as needed -pt was anxious about coming to rehab, people she might work with. Reassured her that we will take care and that she will do well.  4/18- feeling better about rehab -antipsychotic agents: N/A 5. Neuropsych: This patientiscapable of making decisions on herown behalf. 6. Skin/Wound Care:Routine skin checks 7. Fluids/Electrolytes/Nutrition:Routine in and outs with follow-up chemistries 9. AKI. Resolved. Follow-up chemistries on Monday  4/17- Cr looks good- no dehydration, elevated labs. 4/19: continues to be well controlled.  4/23- well controlled- con't regimen  10. Acute on chronic diastolic  congestive heart failure. Lasix 20 mg daily. Monitor for any signs of fluid overload. Check daily weights   Filed Weights   03/07/20 0411 03/08/20 0451 03/09/20 0455  Weight: 49.1 kg 47.4 kg 48.1 kg     4/20- weights stable- con't regimen down to 48.8 kg  4/22- Weight 47.4 kg- stable- con't meds 11. Pancytopenia. Follow-up labs on Monday 12. Hypertension. Hydralazine 25 mg every 8 hours. Monitor with increased mobility 13. Tobacco and alcohol abuse. Continue NicoDerm patch. Provide counseling 14. Urinary retention. Urecholine 5 mg 3 times daily. Check PVR -oob to void  4/17- nursing notes document foley catheter- will check into this  4/18- replaced foley catheter this AM- hers wasn't working- will see if can remove in next few days.   4/20- per nurse, foley was due to a traumatic cath- urology says keep it for ~ 2-3 weeks- will need to go home with it and f/u with Urology.   4/22- discussed with pt- will need to go home with it- had so many in/out caths- sounds like ended up with false passage. Needs Urology f/u.   15. GERD. Protonix 16. Bowel incontinence/C Diff  4/17- will order bowel program nightly after dinner- since works best after a meal-with dig stim and suppository. Since pt has NO control/feeling of BMs- not sure why. Incontinent documented.    4/18- pt had 2 BMs after bowel program last night- 10pm and 11:30 pm-   -not sure WHY having loss of bowel sensation/control- will d/w team.  4/21- refused bowel program 2x in last 2 days- getting CT of thoracic and lumbar spine to see why might need bowel program/   [facility]? Also ordered Cdiff since 5 loose BM sin last 24 hours.  4/22- C Diff (+)- have also called Dr 5/22 (and previously [facility] consult who referred me to Dr Shon Baton) and l/m to discuss pt's care- CTs look like  nothing acute, but cannot get MRIs.  4/23- called and L/M for Dr Rolena Infante- no   Response so far; only 1 stool last night-  17. Hypokalemia: K+  3.4. Supplement with 60meq K+ and repeat BMP 4/20.   4/20- K+ up to 3.7  4/21- Give KCL 40 mEq x2 and recheck in AM   4/22- K+ 4.1- con't to follow  LOS: 7 days A FACE TO FACE EVALUATION WAS PERFORMED  Yuchen Fedor 03/09/2020, 8:36 AM

## 2020-03-09 NOTE — Progress Notes (Signed)
Physical Therapy Session Note  Patient Details  Name: Dawn Beasley MRN: [id number] Date of Birth: 1951-09-13  Today's Date: 03/09/2020 PT Individual Time: 0900-0957 PT Individual Time Calculation (min): 57 min   Short Term Goals: Week 1:  PT Short Term Goal 1 (Week 1): Pt will perform sit<>stands with CGA PT Short Term Goal 2 (Week 1): Pt will perform stand pivot transfers with CGA PT Short Term Goal 3 (Week 1): Pt will ambulate at least 125f using LRAD with CGA PT Short Term Goal 4 (Week 1): Pt will ascend/descend 4 steps using 1HR and min assist  Skilled Therapeutic Interventions/Progress Updates:    Patient received in bed, pleasant and willing to participate in session, reporting some chronic back pain but that she has already been medicated by nursing and ready to try PT. Did need VC to facilitate log rolling/side to sit to help prevent exacerbation of back pain, but generally able to perform all functional bed mobility and transfers with S/VC today. Worked on walking in her room with min guard and no device with cues for pacing and safety given tight environment, able to ambulate approximately 572fwith multiple turns in her room- after ambulation, HR 117 and O2 94% on RA. Rested then returned to supine and worked on functional exercises including bridges, lumbar rotation, SLRs, and supine marches with core stabilization techniques, HR then down to 105/O2 to 96% RA. Returned to sitting with S and worked on eccentric stand to sits x5 limited by fatigue and B quad weakness- she then had incontinent BM and was escorted to bathroom with min guard/no device, but able to assist in pericare and donning new brief/shorts with only MinA required. HR 131/O2 95% when back at EOB. Left in supine positioned to comfort with all needs met and bed alarm active this morning, nursing aware of likely need for foley care.   Therapy Documentation Precautions:  Precautions Precautions: Fall Precaution  Comments: watch HR and SpO2 Restrictions Weight Bearing Restrictions: No   Pain: Pain Assessment Pain Scale: 0-10 Pain Score: 6  Pain Type: Chronic pain Pain Location: Back Pain Orientation: Lower Pain Descriptors / Indicators: Aching;Sore Pain Onset: On-going Patients Stated Pain Goal: 0 Pain Intervention(s): Other (Comment);Repositioned;Ambulation/increased activity(pre-medicated by nursing) Multiple Pain Sites: No    Therapy/Group: Individual Therapy   KrWindell NorfolkDPT, PN1   Supplemental Physical Therapist [place]  Pager [phone] [phone]  03/09/2020, 12:14 PM

## 2020-03-09 NOTE — Progress Notes (Signed)
Patient refused bowel program due to frequent loose stools from CDiff.

## 2020-03-10 MED ORDER — POLYETHYLENE GLYCOL 3350 17 G PO PACK: 17.0000 g | PACK | Freq: Every day | ORAL | Status: DC | PRN

## 2020-03-10 NOTE — Progress Notes (Signed)
[place] PHYSICAL MEDICINE & REHABILITATION PROGRESS NOTE   Subjective/Complaints: Pt has incont of liq stool.  Pt reports normal sensation in peri area when she wipes Reviewed CT T and L spine no sig stenosis    ROS:  Pt denies SOB, abd pain, CP, N/V, + diarrhea, incont stool last noc and this am    Objective: No results found. Recent Labs    03/08/20 0657  WBC 10.9*  HGB 8.8*  HCT 26.6*  PLT 449*   Recent Labs    03/08/20 0657  NA 133*  K 4.1  CL 99  CO2 25  GLUCOSE 108*  BUN 6*  CREATININE 0.51  CALCIUM 8.8*    Intake/Output Summary (Last 24 hours) at 03/10/2020 0655 Last data filed at 03/10/2020 0500 Gross per 24 hour  Intake 582 ml  Output 1600 ml  Net -1018 ml     Physical Exam: Vital Signs Blood pressure 129/74, pulse 97, temperature 98.3 F (36.8 C), temperature source Oral, resp. rate 16, height 5\' 2"  (1.575 m), weight 48.8 kg, SpO2 92 %. Physical Exam  General: No acute distress Mood and affect are appropriate Heart: Regular rate and rhythm no rubs murmurs or extra sounds Lungs: Clear to auscultation, breathing unlabored, no rales or wheezes Abdomen: Positive bowel sounds, soft nontender to palpation, nondistended Extremities: No clubbing, cyanosis, or edema Skin: No evidence of breakdown, no evidence of rash Neurologic: Cranial nerves II through XII intact, motor strength is 4/5 in bilateral deltoid, bicep, tricep, grip, hip flexor, knee extensors, ankle dorsiflexor and plantar flexor Sensory exam normal sensation to light touch butoocks and groin area   Musculoskeletal: Full range of motion in all 4 extremities. No joint swelling  Assessment/Plan: 1. Functional deficits secondary to debility from acute respiratory failure with hypoxiaextubated 02/13/2020/pulmonary emboli/acute metabolic encephalopathywhich require 3+ hours per day of interdisciplinary therapy in a comprehensive inpatient rehab setting.  Physiatrist is providing close  team supervision and 24 hour management of active medical problems listed below.  Physiatrist and rehab team continue to assess barriers to discharge/monitor patient progress toward functional and medical goals  Care Tool:  Bathing    Body parts bathed by patient: Right arm, Left arm, Chest, Abdomen, Front perineal area, Buttocks, Right upper leg, Left upper leg, Right lower leg, Left lower leg, Face         Bathing assist Assist Level: Supervision/Verbal cueing     Upper Body Dressing/Undressing Upper body dressing   What is the patient wearing?: Pull over shirt    Upper body assist Assist Level: Set up assist    Lower Body Dressing/Undressing Lower body dressing      What is the patient wearing?: Pants     Lower body assist Assist for lower body dressing: Supervision/Verbal cueing     Toileting Toileting    Toileting assist Assist for toileting: Minimal Assistance - Patient > 75%     Transfers Chair/bed transfer  Transfers assist     Chair/bed transfer assist level: Supervision/Verbal cueing     Locomotion Ambulation   Ambulation assist      Assist level: Contact Guard/Touching assist Assistive device: No Device Max distance: 50   Walk 10 feet activity   Assist     Assist level: Contact Guard/Touching assist Assistive device: No Device   Walk 50 feet activity   Assist    Assist level: Contact Guard/Touching assist Assistive device: No Device    Walk 150 feet activity   Assist Walk 150 feet activity  did not occur: Safety/medical concerns         Walk 10 feet on uneven surface  activity   Assist     Assist level: Minimal Assistance - Patient > 75% Assistive device: Hand held assist   Wheelchair     Assist Will patient use wheelchair at discharge?: No             Wheelchair 50 feet with 2 turns activity    Assist            Wheelchair 150 feet activity     Assist          Blood pressure  129/74, pulse 97, temperature 98.3 F (36.8 C), temperature source Oral, resp. rate 16, height 5\' 2"  (1.575 m), weight 48.8 kg, SpO2 92 %.  Medical Problem List and Plan: 1.Decreased functional mobility with altered mental statussecondary to acute respiratory failure with hypoxiaextubated 02/13/2020/pulmonary emboli/acute metabolic encephalopathy  4/23- have attempted to call Dr 5/23 about neurogenic bowel and bladder- CT shows nothing that would explain that.  -patient may shower -ELOS/Goals: 10-12 days, supervision to mod I goals  -Continue CIR PT, OT, SLP 2. Antithrombotics: -DVT/anticoagulation:Eliquisfor pulmonary emboli -antiplatelet therapy: n/a 3. Pain Management/chroniclow backpain:pt uses her oxycodone 4x daily at home on a schedule. Will schedule her doses at 0700 and 1200 daily, and leave other doses up to her in the PM.  -has spinal cord stimulator per Dr. Shon Baton, Dr. Shon Baton manages her chronic pain  4/17- will schedule tramadol 50 mg q6 hours for additional pain control  4/18- is working a little better for her- explained I cannot insist pain doctor do tramadol, but can con't here.  4. Mood/panic disorder:Xanax 0.5 mg twice daily as needed -pt was anxious about coming to rehab, people she might work with. Reassured her that we will take care and that she will do well.  4/18- feeling better about rehab -antipsychotic agents: N/A 5. Neuropsych: This patientiscapable of making decisions on herown behalf. 6. Skin/Wound Care:Routine skin checks 7. Fluids/Electrolytes/Nutrition:Routine in and outs with follow-up chemistries 9. AKI. Resolved. Follow-up chemistries on Monday  4/17- Cr looks good- no dehydration, elevated labs. 4/19: continues to be well controlled.  4/23- well controlled- con't regimen  10. Acute on chronic diastolic congestive heart failure. Lasix 20 mg daily. Monitor for  any signs of fluid overload. Check daily weights   Filed Weights   03/09/20 0455 03/09/20 1400 03/10/20 0459  Weight: 48.1 kg 48.5 kg 48.8 kg     4/20- weights stable- con't regimen down to 48.8 kg  4/22- Weight 47.4 kg- stable- con't meds 11. Pancytopenia. Follow-up labs on Monday 12. Hypertension. Hydralazine 25 mg every 8 hours. Monitor with increased mobility 13. Tobacco and alcohol abuse. Continue NicoDerm patch. Provide counseling 14. Urinary retention. Urecholine 5 mg 3 times daily. Check PVR, likely due to immobility  but now requiring foley for false passage, uro f/u as OP for voiding trial    4/20- per nurse, foley was due to a traumatic cath- urology says keep it for ~ 2-3 weeks- will need to go home with it and f/u with Urology.   4/22- discussed with pt- will need to go home with it- had so many in/out caths- sounds like ended up with false passage. Needs Urology f/u.   15. GERD. Protonix 16. Bowel incontinence/C Diff- given normal senstion and lack of stenosis on CT doubt this is spine related   4/22- C Diff (+)- have also called Dr 5/22 (and previously  [facility] consult who referred me to Dr Rolena Infante) and l/m to discuss pt's care- CTs look like nothing acute, but cannot get MRIs.  4/23- called and L/M for Dr Rolena Infante- no   Response so far; only 1 stool last night-  17. Hypokalemia: K+ 3.4. Supplement with 83meq K+ and repeat BMP 4/20.   4/20- K+ up to 3.7  4/21- Give KCL 40 mEq x2 and recheck in AM   4/22- K+ 4.1- con't to follow  LOS: 8 days A FACE TO Garibaldi E  03/10/2020, 6:55 AM

## 2020-03-11 ENCOUNTER — Encounter (HOSPITAL_COMMUNITY): Payer: PPO | Admitting: Occupational Therapy

## 2020-03-11 ENCOUNTER — Ambulatory Visit (HOSPITAL_COMMUNITY): Payer: PPO

## 2020-03-11 NOTE — Progress Notes (Signed)
Physical Therapy Session Note  Patient Details  Name: Dawn Beasley MRN: [id number] Date of Birth: 22-May-1951  Today's Date: 03/11/2020 PT Individual Time: 1300-1400 PT Individual Time Calculation (min): 60 min   Short Term Goals: Week 1:  PT Short Term Goal 1 (Week 1): Pt will perform sit<>stands with CGA PT Short Term Goal 2 (Week 1): Pt will perform stand pivot transfers with CGA PT Short Term Goal 3 (Week 1): Pt will ambulate at least 158ft using LRAD with CGA PT Short Term Goal 4 (Week 1): Pt will ascend/descend 4 steps using 1HR and min assist  Skilled Therapeutic Interventions/Progress Updates:     Patient in bed with her husband in the room present for family education session upon PT arrival. Patient alert and agreeable to PT session. Patient reported 7/10 back pain during session, RN made aware. PT provided repositioning, rest breaks, and distraction as pain interventions throughout session. Patient's husband present for family education throughout session.  Therapeutic Activity: Bed Mobility: Patient performed supine to/from sit with supervision-mod I. Provided verbal cues for performing log rolling to reduce back pain with mobility. Patient sat EOB and threaded catheter bag through pants and donned pants with min A-supervision for catheter management. Educated patient and her husband on management of catheter throughout session and discussed follow-up with urologist in OP. Transfers: Patient performed sit to/from stand x4 and stand pivot x1 with supervision-mod I using a RW. Provided verbal cues for pushing up with 1 hand for safety x2. Patient performed a simulated sedan height car transfer with supervision using RW. Provided cues for safe technique and guarding.  Gait Training:  Patient ambulated 15-20 feet x2 and 50 feet x1 using RW with supervision-mod I. Ambulated with decreased gait speed, decreased step length and height, increased B hip and knee flexion in stance, forward  trunk lean, and downward head gaze. Provided verbal cues for erect posture and increased foot clearance.  Patient ambulated up/down a ramp, over 10 feet of mulch (unlevel surface), and up/down a curb to simulate community ambulation over unlevel surfaces with CGA using RW. Provided cues for technique and use of AD. Patient ascended/descended 3 steps using R rail with B UE support with CGA for steadying support. Performed step-to gait pattern leading with L while ascending and R while descending. Provided cues for technique and safe guarding technique.   Patient in bed with her husband in the room at end of session with breaks locked, bed alarm set, and all needs within reach. Educated patient on fall risk/prevention, home modifications to prevent falls, and activation of emergency services in the event of a fall during session. Also educated on energy conservation techniques at home and in the community. Patient's husband participated in all mobility and provided safe guarding techniques following PT demonstration throughout.   Therapy Documentation Precautions:  Precautions Precautions: Fall Precaution Comments: watch HR and SpO2 Restrictions Weight Bearing Restrictions: No    Therapy/Group: Individual Therapy  Dvora Buitron L Acelyn Basham PT, DPT  03/11/2020, 4:46 PM

## 2020-03-11 NOTE — Progress Notes (Signed)
Occupational Therapy Session Note  Patient Details  Name: Dawn Beasley MRN: [id number] Date of Birth: Aug 26, 1951  Today's Date: 03/11/2020 OT Individual Time: 1400-1500 OT Individual Time Calculation (min): 60 min    Short Term Goals: Week 1:  OT Short Term Goal 1 (Week 1): Patient will complete LB dressing with supervision. OT Short Term Goal 2 (Week 1): Patient will complete toilet transfers with supervision and min cues for technique. OT Short Term Goal 3 (Week 1): Patient will engage in functional activity in standing for 3 minutes without rest break to increase functional endurance for ADLs.  Skilled Therapeutic Interventions/Progress Updates:    Patient in bed, husband present for family education session.  She reports that she is having pain in her back - she discussed pain med options with nursing and then declined having them at this time.  Supine to sitting with CS.  Reviewed management of catheter bag through pant legs, appropriate places to suspend the bag and emptying - patient and husband demonstrate understanding.  Reviewed and practiced use of tub bench in garden tub at home - husband provided dimensions and we determined that a tub bench should fit in that setting.  Patient able to demonstrate this transfer simulating lifting leg over width of garden tub with CS.  Husband plans to order/obtain tub bench prior to her return home.  Patient demonstrated ability to walk to/from therapy treatment areas with rollator CS level.  Reviewed energy conservation techniques and options for keeping frequently used items accessible during the day when she is home alone.  Recommend CS for tub transfer and showers post discharge.  Also recommend CS for mobility when she is having extreme pain or fatigue - she plans to call a neighbor/friend when her husband is at work.  Patient returned to bed at close of session with CS.  Bed alarm set and call bell in reach - both patient and husband express  readiness for discharge on Tuesday of this coming week.    Therapy Documentation Precautions:  Precautions Precautions: Fall Precaution Comments: watch HR and SpO2 Restrictions Weight Bearing Restrictions: No  Therapy/Group: Individual Therapy  Barrie Lyme 03/11/2020, 7:37 AM

## 2020-03-11 NOTE — Discharge Summary (Signed)
Physician Discharge Summary  Patient ID: Dawn Beasley MRN: [id number] DOB/AGE: 1951/09/14 69 y.o.  Admit date: 03/02/2020 Discharge date: 03/13/2020  Discharge Diagnoses:  Principal Problem:   Debility Active Problems:   Acute metabolic encephalopathy DVT prophylaxis Pain management Mood stabilization C. difficile Acute on chronic diastolic congestive heart failure Pancytopenia Hypertension Tobacco and alcohol use Urinary retention GERD Pulmonary emboli  Discharged Condition: Stable  Significant Diagnostic Studies: CT ABDOMEN PELVIS WO CONTRAST  Result Date: 02/21/2020 CLINICAL DATA:  Abdominal pain and distention. EXAM: CT ABDOMEN AND PELVIS WITHOUT CONTRAST TECHNIQUE: Multidetector CT imaging of the abdomen and pelvis was performed following the standard protocol without IV contrast. COMPARISON:  October 06, 2018. FINDINGS: Lower chest: Moderate bilateral pleural effusions are noted with adjacent subsegmental atelectasis or infiltrates. Hepatobiliary: No focal liver abnormality is seen. No gallstones, gallbladder wall thickening, or biliary dilatation. Pancreas: Unremarkable. No pancreatic ductal dilatation or surrounding inflammatory changes. Spleen: Normal in size without focal abnormality. Adrenals/Urinary Tract: Adrenal glands appear normal. Mild bilateral hydroureteronephrosis is noted. Severe urinary bladder distention is noted concerning for bladder outlet obstruction. No renal or ureteral calculi are noted. Stomach/Bowel: Feeding tube is seen passing through the stomach and into proximal duodenum. No evidence of bowel obstruction or inflammation is noted. The appendix is not visualized. Vascular/Lymphatic: Aortic atherosclerosis. No enlarged abdominal or pelvic lymph nodes. Reproductive: Uterus and bilateral adnexa are unremarkable. Other: Mild ascites is noted. Mild anasarca is noted. No hernia is noted. Musculoskeletal: No acute or significant osseous findings. IMPRESSION: 1.  Moderate bilateral pleural effusions are noted with adjacent subsegmental atelectasis or infiltrates. 2. Mild ascites is noted. 3. Mild anasarca is noted. 4. Severe urinary bladder distention is noted concerning for bladder outlet obstruction. Mild bilateral hydroureteronephrosis is noted. No renal or ureteral calculi are noted. Aortic Atherosclerosis (ICD10-I70.0). Electronically Signed   By: Lupita Raider M.D.   On: 02/21/2020 19:37   CT HEAD WO CONTRAST  Result Date: 02/15/2020 CLINICAL DATA:  Encephalopathy. Additional provided: Patient in ED confused and hypoxic. EXAM: CT HEAD WITHOUT CONTRAST TECHNIQUE: Contiguous axial images were obtained from the base of the skull through the vertex without intravenous contrast. COMPARISON:  Head CT 02/11/2019 FINDINGS: Brain: There is no evidence of acute intracranial hemorrhage, intracranial mass, midline shift or extra-axial fluid collection.No demarcated cortical infarction. Redemonstrated chronic small-vessel infarct within the left corona radiata/basal ganglia. Additional ill-defined hypoattenuation within the cerebral white matter is nonspecific, but consistent with chronic small vessel ischemic disease. Findings are similar to prior examination. Stable, mild generalized parenchymal atrophy. Vascular: No hyperdense vessel.  Atherosclerotic calcifications. Skull: Normal. Negative for fracture or focal lesion. Sinuses/Orbits: Visualized orbits demonstrate no acute abnormality. Chronic deformity of the bilateral maxillary sinuses. Previous surgical fixation of the anterior maxillary sinuses. No significant paranasal sinus disease or mastoid effusion at the imaged levels. Chronic bilateral nasal bone fractures IMPRESSION: No evidence of acute intracranial abnormality. Stable chronic small vessel ischemic disease. 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 Stable, mild generalized parenchymal atrophy. Electronically Signed    By: Jackey Loge DO   On: 02/15/2020 16:11   CT ANGIO CHEST PE W OR WO CONTRAST  Result Date: 02/15/2020 CLINICAL DATA:  Shortness of breath. Chest x-ray showed diffuse RIGHT-sided interstitial opacities and dense consolidation in the RIGHT LOWER lobe concerning for aspiration pneumonia. EXAM: CT ANGIOGRAPHY CHEST WITH CONTRAST TECHNIQUE: Multidetector CT imaging of the chest was performed using the standard protocol during bolus administration of intravenous contrast. Multiplanar CT image reconstructions  and MIPs were obtained to evaluate the vascular anatomy. CONTRAST:  12mL OMNIPAQUE IOHEXOL 350 MG/ML SOLN COMPARISON:  CT anterior chest on 01/11/2020, chest x-ray 02/15/2020 and earlier FINDINGS: Cardiovascular: The heart is enlarged. There are coronary artery calcifications. No pericardial effusion. There is significant atherosclerotic calcification of the thoracic aorta. Ascending aorta is 4.1 centimeters and stable in appearance. Pulmonary arteries are well opacified by contrast bolus. Focal embolus identified within a segmental branch of the RIGHT LOWER lobe pulmonary artery on image 175 of series 5 and image 82 of series 9. No evidence for RIGHT heart strain. Mediastinum/Nodes: Esophagus is unremarkable. The visualized portion of the thyroid gland has a normal appearance. Confluent mediastinal lymph nodes are likely reactive in have increased since previous exam. Lungs/Pleura: Interval development of bilateral pleural effusions, LEFT greater than RIGHT. There are confluent airspace filling opacities involving the RIGHT UPPER and LOWER lobes with a dependent gradient. The RIGHT middle lobe is relatively spared. Similar changes are identified within the anterior aspect of the LEFT UPPER lobe. There is minimal LEFT LOWER lobe atelectasis. The affected lung parenchyma shows interval development of small cystic changes. There is no pneumothorax. Upper Abdomen: Partially imaged spinal stimulator. UPPER abdomen  is otherwise unremarkable. Musculoskeletal: Mild degenerative changes in the midthoracic spine. No suspicious lytic or blastic lesions are identified. Review of the MIP images confirms the above findings. IMPRESSION: 1. Technically adequate exam showing acute pulmonary embolus within segmental branch of the RIGHT LOWER lobe pulmonary artery. 2. Interval development of bilateral pleural effusions, LEFT greater than RIGHT. 3. Dense airspace filling opacities in the RIGHT UPPER and LOWER lobes and to lesser degree in the anterior aspect of the LEFT UPPER lobe. Appearance favors infectious process. 4. Interval development of small cystic changes within the affected lung parenchyma. 5. Cardiomegaly and coronary artery disease. 6. Stable appearance of ascending thoracic aortic aneurysm, 4.1 centimeters. 7. Recommend annual imaging followup by CTA or MRA. This recommendation follows 2010 ACCF/AHA/AATS/ACR/ASA/SCA/SCAI/SIR/STS/SVM Guidelines for the Diagnosis and Management of Patients with Thoracic Aortic Disease. Circulation. 2010; 121: Z610-R604. 8. Aortic aneurysm NOS (ICD10-I71.9) Aortic Atherosclerosis (ICD10-I70.0). Critical Value/emergent results were called by telephone at the time of interpretation on 02/15/2020 at 4:16 pm to provider [facility] , who verbally acknowledged these results. Electronically Signed   By: Nolon Nations M.D.   On: 02/15/2020 16:29   CT THORACIC SPINE WO CONTRAST  Result Date: 03/07/2020 CLINICAL DATA:  Neurogenic bowel and bladder. History of spinal cord stimulator placement and lumbar surgery. EXAM: CT THORACIC AND LUMBAR SPINE WITHOUT CONTRAST TECHNIQUE: Multidetector CT imaging of the thoracic and lumbar spine was performed without contrast. Multiplanar CT image reconstructions were also generated. COMPARISON:  Lumbar CT myelogram 05/15/2016. Chest CTA 02/15/2020. FINDINGS: CT THORACIC SPINE FINDINGS Alignment: Exaggerated thoracic kyphosis. Trace retrolisthesis of C5 on  C6. Vertebrae: No acute fracture or destructive osseous process. Spinal cord stimulator leads terminate to the right of midline in the dorsal spinal canal at T8-9. Paraspinal and other soft tissues: Aortic and coronary atherosclerosis. Overall improved lung aeration compared to the prior CTA with greatest remaining consolidation in the right upper lobe along the minor fissure. Underlying centrilobular emphysema. Disc levels: Moderate disc degeneration with disc space narrowing and degenerative spurring greatest in the midthoracic spine. Limited assessment of the spinal canal from T8-T10 due to streak artifact from the stimulator electrodes. No evidence of significant spinal stenosis elsewhere within limitations of non myelographic technique. No significant osseous neural foraminal stenosis. CT LUMBAR SPINE FINDINGS Segmentation: 5  lumbar type vertebrae. Alignment: Increased, mild S-shaped lumbar scoliosis. 2 mm anterolisthesis of L3 on L4. Vertebrae: No acute fracture or destructive osseous process. Paraspinal and other soft tissues: Partially visualized Foley catheter in the bladder. Scattered fluid in the colon and rectum with recent history of frequent bowel movements. Abdominal aortic atherosclerosis without aneurysm. Disc levels: L1-2: Mild disc space narrowing. Disc bulging eccentric to the left and mild facet hypertrophy result in likely mild left lateral recess stenosis and mild left neural foraminal stenosis, mildly progressed from the prior myelogram. No spinal stenosis. L2-3: Severe asymmetric left-sided disc space narrowing with progressive degenerative endplate changes. Circumferential disc bulging, endplate spurring, disc space height loss, and mild facet hypertrophy result in mild spinal stenosis and mild left neural foraminal stenosis, stable to slightly progressed. L3-4: Unchanged moderate disc space narrowing. Previous laminectomies. Minimally increased anterolisthesis with bulging uncovered disc  and severe facet hypertrophy result in stable to slight worsening of mild to moderate bilateral neural foraminal stenosis. No spinal stenosis. L4-5: Unchanged severe disc space narrowing with vacuum disc and degenerative endplate sclerosis. Previous laminectomies. Disc bulging, endplate spurring, disc space height loss, and moderate facet hypertrophy result in unchanged moderate right neural foraminal stenosis without spinal stenosis. L5-S1: Unchanged severe disc space narrowing with vacuum disc and degenerative endplate sclerosis. Disc bulging, endplate spurring, and disc space height loss result in mild bilateral neural foraminal stenosis without spinal stenosis, unchanged. IMPRESSION: 1. No acute osseous abnormality identified in the thoracic or lumbar spine. 2. Mild progression of advanced lumbar disc degeneration with mild chronic spinal stenosis at L2-3. 3. Mild-to-moderate multilevel neural foraminal stenosis in the lumbar spine as above. 4. Moderate thoracic disc degeneration without evidence of significant stenosis. 5. Spinal cord stimulator in place with leads terminating at T8-9. 6. Improved lung aeration compared to 02/15/2020 with residual consolidation greatest in the right upper lobe. 7. Aortic Atherosclerosis (ICD10-I70.0) and Emphysema (ICD10-J43.9). Electronically Signed   By: Sebastian Ache M.D.   On: 03/07/2020 19:59   CT LUMBAR SPINE WO CONTRAST  Result Date: 03/07/2020 CLINICAL DATA:  Neurogenic bowel and bladder. History of spinal cord stimulator placement and lumbar surgery. EXAM: CT THORACIC AND LUMBAR SPINE WITHOUT CONTRAST TECHNIQUE: Multidetector CT imaging of the thoracic and lumbar spine was performed without contrast. Multiplanar CT image reconstructions were also generated. COMPARISON:  Lumbar CT myelogram 05/15/2016. Chest CTA 02/15/2020. FINDINGS: CT THORACIC SPINE FINDINGS Alignment: Exaggerated thoracic kyphosis. Trace retrolisthesis of C5 on C6. Vertebrae: No acute fracture or  destructive osseous process. Spinal cord stimulator leads terminate to the right of midline in the dorsal spinal canal at T8-9. Paraspinal and other soft tissues: Aortic and coronary atherosclerosis. Overall improved lung aeration compared to the prior CTA with greatest remaining consolidation in the right upper lobe along the minor fissure. Underlying centrilobular emphysema. Disc levels: Moderate disc degeneration with disc space narrowing and degenerative spurring greatest in the midthoracic spine. Limited assessment of the spinal canal from T8-T10 due to streak artifact from the stimulator electrodes. No evidence of significant spinal stenosis elsewhere within limitations of non myelographic technique. No significant osseous neural foraminal stenosis. CT LUMBAR SPINE FINDINGS Segmentation: 5 lumbar type vertebrae. Alignment: Increased, mild S-shaped lumbar scoliosis. 2 mm anterolisthesis of L3 on L4. Vertebrae: No acute fracture or destructive osseous process. Paraspinal and other soft tissues: Partially visualized Foley catheter in the bladder. Scattered fluid in the colon and rectum with recent history of frequent bowel movements. Abdominal aortic atherosclerosis without aneurysm. Disc levels: L1-2: Mild disc space  narrowing. Disc bulging eccentric to the left and mild facet hypertrophy result in likely mild left lateral recess stenosis and mild left neural foraminal stenosis, mildly progressed from the prior myelogram. No spinal stenosis. L2-3: Severe asymmetric left-sided disc space narrowing with progressive degenerative endplate changes. Circumferential disc bulging, endplate spurring, disc space height loss, and mild facet hypertrophy result in mild spinal stenosis and mild left neural foraminal stenosis, stable to slightly progressed. L3-4: Unchanged moderate disc space narrowing. Previous laminectomies. Minimally increased anterolisthesis with bulging uncovered disc and severe facet hypertrophy result  in stable to slight worsening of mild to moderate bilateral neural foraminal stenosis. No spinal stenosis. L4-5: Unchanged severe disc space narrowing with vacuum disc and degenerative endplate sclerosis. Previous laminectomies. Disc bulging, endplate spurring, disc space height loss, and moderate facet hypertrophy result in unchanged moderate right neural foraminal stenosis without spinal stenosis. L5-S1: Unchanged severe disc space narrowing with vacuum disc and degenerative endplate sclerosis. Disc bulging, endplate spurring, and disc space height loss result in mild bilateral neural foraminal stenosis without spinal stenosis, unchanged. IMPRESSION: 1. No acute osseous abnormality identified in the thoracic or lumbar spine. 2. Mild progression of advanced lumbar disc degeneration with mild chronic spinal stenosis at L2-3. 3. Mild-to-moderate multilevel neural foraminal stenosis in the lumbar spine as above. 4. Moderate thoracic disc degeneration without evidence of significant stenosis. 5. Spinal cord stimulator in place with leads terminating at T8-9. 6. Improved lung aeration compared to 02/15/2020 with residual consolidation greatest in the right upper lobe. 7. Aortic Atherosclerosis (ICD10-I70.0) and Emphysema (ICD10-J43.9). Electronically Signed   By: Sebastian Ache M.D.   On: 03/07/2020 19:59   DG CHEST PORT 1 VIEW  Result Date: 02/27/2020 CLINICAL DATA:  Shortness of breath EXAM: PORTABLE CHEST 1 VIEW COMPARISON:  February 26, 2020. FINDINGS: There is persistent airspace opacity in the right upper lobe with slightly more consolidation near the right minor fissure compared to 1 day prior. There is atelectasis in the left lower lobe with small left pleural effusion. Small right pleural effusion also noted. Heart is upper normal in size with pulmonary vascularity normal. Feeding tube tip is at the level of the pylorus. Thoracic stimulator leads are in the midthoracic region, stable. There is aortic  atherosclerosis. No bone lesions. IMPRESSION: Persistent airspace opacity right upper lobe with slightly greater consolidation near the minor fissure on the right compared to 1 day prior. Persistent small left pleural effusion with left lower lobe atelectasis. Stable small right pleural effusion. Stable cardiac silhouette.  Aortic Atherosclerosis (ICD10-I70.0). Feeding tube tip at level of pylorus. Electronically Signed   By: Bretta Bang III M.D.   On: 02/27/2020 12:45   DG CHEST PORT 1 VIEW  Result Date: 02/26/2020 CLINICAL DATA:  Short of breath EXAM: PORTABLE CHEST 1 VIEW COMPARISON:  02/22/2020 FINDINGS: Feeding tube unchanged. Normal cardiac silhouette. Pleural thickening fluid along the RIGHT horizontal fissure unchanged. Increase in central venous congestion. LEFT basilar atelectasis and effusion. IMPRESSION: 1. Mild increase in central venous congestion. 2. Bilateral pleural effusions and basilar atelectasis. Electronically Signed   By: Genevive Bi M.D.   On: 02/26/2020 04:42   DG CHEST PORT 1 VIEW  Result Date: 02/22/2020 CLINICAL DATA:  Pneumonia. EXAM: PORTABLE CHEST 1 VIEW COMPARISON:  Single-view of the chest 02/20/2020 and 02/17/2020. FINDINGS: Feeding tube and spinal stimulator are again seen. Bilateral airspace disease and small effusions have markedly improved since the most recent exam. No pneumothorax. Heart size is normal. Atherosclerosis noted. IMPRESSION: Marked improvement in bilateral  effusions and right worse than left airspace disease. Electronically Signed   By: Drusilla Kanner M.D.   On: 02/22/2020 11:08   DG CHEST PORT 1 VIEW  Result Date: 02/20/2020 CLINICAL DATA:  History of acute hypoxemic respiratory failure due to aspiration pneumonia and pulmonary embolus. History of COPD. EXAM: PORTABLE CHEST 1 VIEW COMPARISON:  Single-view of the chest 02/17/2020 and 02/15/2020. CT chest 02/15/2020. FINDINGS: Left pleural effusion and basilar airspace disease have worsened  since the most recent examination. Diffuse airspace disease in the right chest appears mildly improved in the right upper lobe. Right pleural effusion is unchanged. There is cardiomegaly. Lungs are emphysematous. Feeding tube and spinal cord stimulator are noted. IMPRESSION: Increased left pleural effusion and basilar airspace disease. Mild improvement in diffuse airspace disease throughout the right chest. Small right effusion is unchanged. Cardiomegaly. Emphysema. Electronically Signed   By: Drusilla Kanner M.D.   On: 02/20/2020 10:09   DG CHEST PORT 1 VIEW  Result Date: 02/17/2020 CLINICAL DATA:  Shortness of breath and confusion EXAM: PORTABLE CHEST 1 VIEW COMPARISON:  February 12, 2020 and February 15, 2020 FINDINGS: Extensive airspace consolidation is noted in the right upper lobe, particularly along the more inferior aspect of the right upper lobe. Ill-defined airspace opacity to noted in the right base with small left pleural effusion. Left lung is clear. Heart is borderline enlarged with pulmonary vascularity within normal limits. There is aortic atherosclerosis. No adenopathy. Feeding tube tip is below the diaphragm. Thoracic stimulator leads are in the lower thoracic region. No bone lesions. IMPRESSION: Extensive airspace opacity right upper lobe with consolidation greatest more inferiorly in the right upper lobe. Somewhat more ill-defined airspace opacity in the right base noted with small right pleural effusion. Left lung clear. No new opacity evident. Stable cardiac silhouette. Aortic Atherosclerosis (ICD10-I70.0). Feeding tube tip below diaphragm. Electronically Signed   By: Bretta Bang III M.D.   On: 02/17/2020 11:49   DG Chest Port 1 View  Result Date: 02/15/2020 CLINICAL DATA:  Acute respiratory failure with hypoxia, shortness of breath EXAM: PORTABLE CHEST 1 VIEW COMPARISON:  Portable exam 0554 hours compared to 02/12/2020 FINDINGS: Intraspinal stimulator identified at caudal thoracic  spine. Interval removal of endotracheal, nasogastric, and LEFT thoracostomy tubes. Enlargement of cardiac silhouette. Atherosclerotic calcification aorta. Prominence of LEFT hilum unchanged. BILATERAL pulmonary infiltrates greatest in RIGHT upper lobe. Small BILATERAL pleural effusions. No pneumothorax or acute osseous findings. IMPRESSION: BILATERAL pulmonary infiltrates greatest in RIGHT upper lobe consistent with multifocal pneumonia. Small bibasilar pleural effusions. Electronically Signed   By: Ulyses Southward M.D.   On: 02/15/2020 08:53   DG Abd 2 Views  Result Date: 02/25/2020 CLINICAL DATA:  Abdominal distension EXAM: ABDOMEN - 2 VIEW COMPARISON:  02/12/2020 FINDINGS: Feeding tube coiled in the stomach. Spinal nerve stimulator projecting over the lower thoracic spine. Cardiomediastinal contours remain mildly enlarged. Perifissural consolidation in the right chest is increased along with increased interstitial markings. Further consolidation at the left lung base with obscured left hemidiaphragm and left pleural effusion. Blunting of right costodiaphragmatic sulcus as well. No free air beneath either right or left hemidiaphragm. Thoracic skeletal structures are unremarkable. Small amount of gas in the stomach. Mildly distended small bowel loops in the abdomen without clear signs of obstruction. Gas in the colon. Small amount of gas likely in the rectum. No acute bone process. IMPRESSION: 1. Persistent areas of airspace disease in the chest, nonspecific likely with superimposed pulmonary edema. 2. Bilateral effusions as before. 3. Feeding tube  coiled in the stomach. 4. Findings of may reflect mild ileus. Electronically Signed   By: Donzetta KohutGeoffrey  Wile M.D.   On: 02/25/2020 18:29   ECHOCARDIOGRAM COMPLETE  Result Date: 02/13/2020    ECHOCARDIOGRAM REPORT   Patient Name:   Dawn Beasley Date of Exam: 02/13/2020 Medical Rec #:  [id number]     Height:       62.0 in Accession #:    [id number]    Weight:       121.7 lb  Date of Birth:  1951-03-26     BSA:          1.548 m Patient Age:    68 years      BP:           121/61 mmHg Patient Gender: F             HR:           119 bpm. Exam Location:  Inpatient Procedure: 2D Echo, Cardiac Doppler and Color Doppler Indications:     Elevated Troponin.  History:         Patient has no prior history of Echocardiogram examinations.                  Risk Factors:Hypertension and Dyslipidemia. Abdominal Aortic                  Aneurysm.  Sonographer:     Tiffany Dance Referring Phys:  [id number] Aliene BeamsLAURA R GLEASON Diagnosing Phys: Epifanio Lescheshristopher Schumann MD  Sonographer Comments: Echo performed with patient supine and on artificial respirator. IMPRESSIONS  1. Left ventricular ejection fraction, by estimation, is 65 to 70%. The left ventricle has normal function. The left ventricle has no regional wall motion abnormalities. There is mild left ventricular hypertrophy. Left ventricular diastolic parameters are indeterminate.  2. Right ventricular systolic function is mildly reduced. Hypokinesis of RV free wall with improved function at apex, suggestive of McConnell sign as can be seen in acute PE. The right ventricular size is moderately enlarged. Tricuspid regurgitation signal is inadequate for assessing PA pressure.  3. Left atrial size was mildly dilated.  4. The mitral valve is normal in structure. No evidence of mitral valve regurgitation.  5. The aortic valve was not well visualized. Aortic valve regurgitation is not visualized. No aortic stenosis is present. FINDINGS  Left Ventricle: Left ventricular ejection fraction, by estimation, is 65 to 70%. The left ventricle has normal function. The left ventricle has no regional wall motion abnormalities. The left ventricular internal cavity size was normal in size. There is  mild left ventricular hypertrophy. Left ventricular diastolic parameters are indeterminate. Right Ventricle: The right ventricular size is moderately enlarged. Right vetricular wall  thickness was not assessed. Right ventricular systolic function is mildly reduced. Tricuspid regurgitation signal is inadequate for assessing PA pressure. Left Atrium: Left atrial size was mildly dilated. Right Atrium: Right atrial size was normal in size. Pericardium: Trivial pericardial effusion is present. Mitral Valve: The mitral valve is normal in structure. No evidence of mitral valve regurgitation. Tricuspid Valve: The tricuspid valve is normal in structure. Tricuspid valve regurgitation is trivial. Aortic Valve: The aortic valve was not well visualized. Aortic valve regurgitation is not visualized. No aortic stenosis is present. Pulmonic Valve: The pulmonic valve was not well visualized. Pulmonic valve regurgitation is not visualized. Aorta: The aortic root and ascending aorta are structurally normal, with no evidence of dilitation. Venous: IVC assessment for right atrial pressure unable to be performed due  to mechanical ventilation. IAS/Shunts: The interatrial septum was not well visualized.  LEFT VENTRICLE PLAX 2D LVIDd:         3.61 cm LVIDs:         1.94 cm LV PW:         1.17 cm LV IVS:        1.03 cm LVOT diam:     2.00 cm LV SV:         57 LV SV Index:   37 LVOT Area:     3.14 cm  RIGHT VENTRICLE             IVC RV Basal diam:  2.51 cm     IVC diam: 1.92 cm RV S prime:     14.90 cm/s TAPSE (M-mode): 2.0 cm LEFT ATRIUM             Index       RIGHT ATRIUM           Index LA diam:        3.20 cm 2.07 cm/m  RA Area:     15.20 cm LA Vol (A2C):   68.0 ml 43.93 ml/m RA Volume:   40.00 ml  25.84 ml/m LA Vol (A4C):   37.3 ml 24.10 ml/m LA Biplane Vol: 52.4 ml 33.85 ml/m  AORTIC VALVE LVOT Vmax:   108.50 cm/s LVOT Vmean:  67.450 cm/s LVOT VTI:    0.180 m  AORTA Ao Root diam: 3.20 cm Ao Asc diam:  3.20 cm MITRAL VALVE MV Area (PHT): 3.65 cm     SHUNTS MV Decel Time: 208 msec     Systemic VTI:  0.18 m MV E velocity: 97.90 cm/s   Systemic Diam: 2.00 cm MV A velocity: 119.00 cm/s MV E/A ratio:  0.82  Epifanio Lesches MD Electronically signed by Epifanio Lesches MD Signature Date/Time: 02/13/2020/12:12:05 PM    Final (Updated)    VAS [place] LOWER EXTREMITY VENOUS (DVT)  Result Date: 02/15/2020  Lower Venous DVTStudy Indications: Pulmonary embolism.  Risk Factors: Confirmed PE. Limitations: Poor patient cooperation. and poor ultrasound/tissue interface. Comparison Study: No prior exam. Performing Technologist: Kennedy Bucker ARDMS, RVT  Examination Guidelines: A complete evaluation includes B-mode imaging, spectral Doppler, color Doppler, and power Doppler as needed of all accessible portions of each vessel. Bilateral testing is considered an integral part of a complete examination. Limited examinations for reoccurring indications may be performed as noted. The reflux portion of the exam is performed with the patient in reverse Trendelenburg.  +---------+---------------+---------+-----------+----------+--------------+ RIGHT    CompressibilityPhasicitySpontaneityPropertiesThrombus Aging +---------+---------------+---------+-----------+----------+--------------+ CFV      Full           Yes      Yes                                 +---------+---------------+---------+-----------+----------+--------------+ SFJ      Full                                                        +---------+---------------+---------+-----------+----------+--------------+ FV Prox  Full                                                        +---------+---------------+---------+-----------+----------+--------------+  FV Mid   Full                                                        +---------+---------------+---------+-----------+----------+--------------+ FV DistalFull                                                        +---------+---------------+---------+-----------+----------+--------------+ PFV      Full                                                         +---------+---------------+---------+-----------+----------+--------------+ POP      Full           Yes      Yes                                 +---------+---------------+---------+-----------+----------+--------------+ PTV      Full                                                        +---------+---------------+---------+-----------+----------+--------------+ PERO     Full                                                        +---------+---------------+---------+-----------+----------+--------------+   +---------+---------------+---------+-----------+----------+--------------+ LEFT     CompressibilityPhasicitySpontaneityPropertiesThrombus Aging +---------+---------------+---------+-----------+----------+--------------+ CFV      Full           Yes      Yes                                 +---------+---------------+---------+-----------+----------+--------------+ SFJ      Full                                                        +---------+---------------+---------+-----------+----------+--------------+ FV Prox  Full                                                        +---------+---------------+---------+-----------+----------+--------------+ FV Mid   Full                                                        +---------+---------------+---------+-----------+----------+--------------+  FV DistalFull                                                        +---------+---------------+---------+-----------+----------+--------------+ PFV      Full                                                        +---------+---------------+---------+-----------+----------+--------------+ POP      Full           Yes      Yes                                 +---------+---------------+---------+-----------+----------+--------------+ PTV      Full                                                         +---------+---------------+---------+-----------+----------+--------------+ PERO     Full                                                        +---------+---------------+---------+-----------+----------+--------------+     Summary: BILATERAL: - No evidence of deep vein thrombosis seen in the lower extremities, bilaterally.  RIGHT: - No cystic structure found in the popliteal fossa.  LEFT: - No cystic structure found in the popliteal fossa.  *See table(s) above for measurements and observations. Electronically signed by Gretta Began MD on 02/15/2020 at 6:37:14 PM.    Final    ECHOCARDIOGRAM LIMITED  Result Date: 02/22/2020    ECHOCARDIOGRAM LIMITED REPORT   Patient Name:   Dawn Beasley Date of Exam: 02/22/2020 Medical Rec #:  [id number]     Height:       62.0 in Accession #:    [id number]    Weight:       121.0 lb Date of Birth:  1951/04/25     BSA:          1.544 m Patient Age:    68 years      BP:           131/58 mmHg Patient Gender: F             HR:           119 bpm. Exam Location:  Inpatient Procedure: Limited Echo, Limited Color Doppler and Cardiac Doppler STAT ECHO Indications:    Pulmonary Embolus; Assessment of RV function and size.  History:        Patient has prior history of Echocardiogram examinations, most                 recent 02/13/2020. Risk Factors:Hypertension.  Sonographer:    Thurman Coyer RDCS (AE) Referring Phys: [id number] BRADLEY L ICARD IMPRESSIONS  1. Left ventricular ejection fraction, by estimation, is 65 to 70%. The left  ventricle has hyperdynamic function. The left ventricle has no regional wall motion abnormalities. There is mild left ventricular hypertrophy. Left ventricular diastolic parameters are consistent with Grade I diastolic dysfunction (impaired relaxation).  2. Right ventricular systolic function is normal. The right ventricular size is normal. Mildly increased right ventricular wall thickness.  3. Moderate pleural effusion in the left lateral region.  4. The  aortic valve is tricuspid. Aortic valve regurgitation is not visualized. Mild aortic valve sclerosis is present, with no evidence of aortic valve stenosis.  5. The inferior vena cava is normal in size with greater than 50% respiratory variability, suggesting right atrial pressure of 3 mmHg. Comparison(s): Changes from prior study are noted. 02/13/2020: LVEF 65-70%, RV hypokinesis. Compared to this study, the RV appears to be improved - less dilated and more contractile. FINDINGS  Left Ventricle: Left ventricular ejection fraction, by estimation, is 65 to 70%. The left ventricle has hyperdynamic function. The left ventricle has no regional wall motion abnormalities. The left ventricular internal cavity size was normal in size. There is mild left ventricular hypertrophy. Normal left ventricular filling pressure. Right Ventricle: The right ventricular size is normal. Mildly increased right ventricular wall thickness. Right ventricular systolic function is normal. Left Atrium: Left atrial size was normal in size. Right Atrium: Right atrial size was normal in size. Pericardium: There is no evidence of pericardial effusion. Aortic Valve: The aortic valve is tricuspid. Aortic valve regurgitation is not visualized. Mild aortic valve sclerosis is present, with no evidence of aortic valve stenosis. Venous: The inferior vena cava is normal in size with greater than 50% respiratory variability, suggesting right atrial pressure of 3 mmHg. IAS/Shunts: No atrial level shunt detected by color flow Doppler. Additional Comments: There is a moderate pleural effusion in the left lateral region.  LEFT VENTRICLE PLAX 2D LVIDd:         3.70 cm Diastology LVIDs:         2.70 cm LV e' lateral:   10.20 cm/s LV PW:         1.00 cm LV E/e' lateral: 7.7 LV IVS:        1.00 cm LV e' medial:    8.81 cm/s                        LV E/e' medial:  8.9  RIGHT VENTRICLE RV S prime:     20.00 cm/s TAPSE (M-mode): 2.1 cm LEFT ATRIUM           Index        RIGHT ATRIUM           Index LA diam:      3.40 cm 2.20 cm/m  RA Area:     10.20 cm LA Vol (A4C): 44.9 ml 29.08 ml/m RA Volume:   18.10 ml  11.72 ml/m   AORTA Ao Root diam: 3.00 cm MITRAL VALVE MV Area (PHT): 4.17 cm MV Decel Time: 182 msec MV E velocity: 78.30 cm/s MV A velocity: 125.00 cm/s MV E/A ratio:  0.63 Zoila Shutter MD Electronically signed by Zoila Shutter MD Signature Date/Time: 02/22/2020/10:18:25 AM    Final     Labs:  Basic Metabolic Panel: Recent Labs  Lab 03/06/20 0532 03/07/20 0507 03/08/20 0657  NA 135 132* 133*  K 3.7 3.3* 4.1  CL 98 98 99  CO2 26 24 25   GLUCOSE 95 92 108*  BUN 8 7* 6*  CREATININE 0.38* 0.41* 0.51  CALCIUM 8.6* 8.5*  8.8*  MG  --   --  1.4*    CBC: Recent Labs  Lab 03/06/20 0532 03/07/20 0507 03/08/20 0657  WBC 10.9* 12.0* 10.9*  NEUTROABS  --  9.8* 9.2*  HGB 7.9* 7.9* 8.8*  HCT 24.3* 24.2* 26.6*  MCV 113.0* 112.0* 113.7*  PLT 373 374 449*    CBG: No results for input(s): GLUCAP in the last 168 hours.  Family history.  History of hypertension as well as hyperlipidemia.  Denies any diabetes mellitus colon cancer or esophageal cancer  Brief HPI:   Dawn Beasley is a 69 y.o. right-handed female with history of thoracic aortic aneurysm, chronic gastric dysmotility, hyperlipidemia, tobacco and alcohol use, hypertension, panic disorder, chronic back pain with lumbar spinal cord stimulator insertion 12/06/2015.  Per chart review lives with spouse independent prior to admission but sedentary.  Two-level home.  Husband can assist as needed.  Presented 02/11/2020 after witnessed syncopal event.  Patient reportedly had a recent endoscopy colonoscopy on 02/06/2020 with findings of single localized nonbleeding erosion found in the cecum as well as colitis and reactive gastropathy.  No stigmata of recent bleeding was seen.  Patient had episode of vomiting was orthostatic received bolus of IV fluids.  Noted oxygen saturation 78% on room air started on 4 L  of oxygen.  Admission chemistries with hemoglobin 8.6, WBC 1.3, platelets 141,000, lactic acid 2.3, sodium 127, BUN 50, creatinine 1.69, D-dimer 2.15, troponin high-sensitivity 523, CK 1746.  Cranial CT scan negative.  CT angiogram of the chest showed acute pulmonary embolus with segmental branch of the right lower lobe pulmonary artery.  Interval development of bilateral pleural effusions left greater than right.  Stable appearance of a sending thoracic aortic aneurysm 4.1 cm.  Patient did require intubation for airway protection extubated 02/13/2020.  She was placed on Eliquis after findings of pulmonary emboli.  Subsequent echocardiogram did not show any heart strain.  She remained in the ICU for acute metabolic encephalopathy/septic shock.  She was transferred out of the ICU 02/24/2020.  Hospital course complicated by mild acute on chronic diastolic congestive heart failure started on IV Lasix good diuresis.  She had been maintained on broad-spectrum antibiotics x10 days since discontinued.  Her renal function stabilized with gentle IV fluids latest creatinine 0.42.  Pancytopenia hemoglobin 8.1, WBC improved 8900, RBC 2.14.  Her mental status continued to improve follow-up cranial CT scan again negative suspect acute metabolic encephalopathy.  Therapy evaluations completed and patient was admitted for a comprehensive rehab program   Hospital Course: KEIRSTIN MUSIL was admitted to rehab 03/02/2020 for inpatient therapies to consist of PT, ST and OT at least three hours five days a week. Past admission physiatrist, therapy team and rehab RN have worked together to provide customized collaborative inpatient rehab.  Pertaining to patient's decreased functional mobility altered mental status secondary to acute respiratory failure hypoxia extubated 02/13/2020 pulmonary emboli with acute metabolic encephalopathy.  She continued to participate with therapies with good overall functional gains.  Maintained on Eliquis for  pulmonary emboli no bleeding episodes.  Chronic back pain as patient did have a lumbar spinal cord stimulator insertion 12/06/2015.  She is currently maintained on low-dose oxycodone as needed as well as scheduled tramadol monitoring of narcotic use due to bouts of confusion altered mental status latest cranial CT scan negative.  She did have a history of anxiety with panic attacks using Xanax as needed.  Blood pressure controlled on hydralazine as well as monitoring of any fluid overload  for history of CHF with low-dose Lasix.  NicoDerm patch ongoing for history of tobacco abuse she also had a history of alcohol use of which receive counts regards to cessation of these products.  Hospital rehab course complicated by C. difficile she was completed course of vancomycin.  Bouts of urinary retention patient was not able to do in and out catheterizations latest urine culture multiple species Foley catheter tube was placed she would follow-up outpatient ambulatory referral with urology services.   Blood pressures were monitored on TID basis and controlled  Dawn Beasley has made gains during rehab stay and is attending therapies  Dawn Beasley will continue to receive follow up therapies   after discharge  Rehab course: During patient's stay in rehab weekly team conferences were held to monitor patient's progress, set goals and discuss barriers to discharge. At admission, patient required minimal assist ambulate 55 feet rolling walker, minimal assist stand pivot transfers, max assist sit to supine.  Set up upper body bathing supervision lower body bathing with occasional minimal guard supervision lower body dressing when seated  Physical exam.  Blood pressure 129/72 pulse 104 temperature 91 respiration 20 oxygen saturation 90% room air Constitutional.  Frail-appearing female oriented x3 HEENT Head.  Normocephalic and atraumatic Neck.  Supple nontender no JVD without thyromegaly Eyes.  Pupils round and reactive to light no  discharge no nystagmus Cardiac regular rate rhythm without any extra sounds or murmur heard Respiratory effort normal no respiratory distress without wheeze Abdomen.  Soft nontender positive bowel sounds without rebound Musculoskeletal.  General.  No edema normal range of motion Neurological.  Normal reflexes no cranial nerve deficits motor strength upper extremities 4/5 proximal to distal lower extremities 3+ to 4 -/5 proximal to 4+ distally with a DF/PF.  No sensory dysfunction.  Dawn Beasley  has had improvement in activity tolerance, balance, postural control as well as ability to compensate for deficits. Dawn Beasley has had improvement in functional use RUE/LUE  and RLE/LLE as well as improvement in awareness.  Working with energy conservation techniques.  Patient cooperative with therapies.  She did need occasional verbal cues to facilitate any logrolling slide to sit to help prevent exacerbation of any back pain.  Worked on walking in her room with minimal guard no device cues for pacing ambulates up to 50 feet multiple turns.  Return to sitting with supervision worked on eccentric stand to sit x5 limited by fatigue.  She can gather her belongings for activities day living and homemaking.  Overall patient with excellent functional gains family teaching completed and discharged home       Disposition: Discharge to home    Diet: Regular  Special Instructions: No driving smoking or alcohol  Amatory referral obtained with urology service for urinary retention Foley catheter tube to remain in place until follow-up urology services  Medications at discharge 1.  Tylenol as needed 2.  Xanax 0.5 mg twice daily as needed 3.  Eliquis 5 mg p.o. twice daily 4.  Folic acid 1 mg p.o. daily 5.  Lasix 20 mg p.o. daily 6.  Gerhart's Butt cream 3 times daily to affected area 7.  Hydralazine 25 mg p.o. every 8 hours 8.  Magnesium oxide 400 mg p.o. twice daily 9.  Multivitamin daily 10.  Oxycodone 5 mg every 6  hours as needed moderate pain 11.  Protonix 40 mg p.o. twice daily 12.  Ultram 50 mg p.o. every 6 hours x1 week and stop 13.  Trazodone 50 mg p.o. nightly 14.  Vancomycin  125 mg p.o. 4 times daily x5 days and stop  30-35 minutes spent with discharge summary and discharge planning.  Discharge Instructions    Ambulatory referral to Urology   Complete by: As directed    Ambulatory referral for urinary retention suspect bladder outlet obstruction      Follow-up Information    Lovorn, Aundra Millet, MD Follow up.   Specialty: Physical Medicine and Rehabilitation Why: Office to call for appointment Contact information: [address] [place] [address] [phone]        Julio Sicks, NP Follow up.   Specialty: Pulmonary Disease Why: Call for appointment Contact information: [address] [place] [address] [place] [address] [phone]           Signed: Mcarthur Rossetti Tyreck Bell 03/13/2020, 5:01 AM

## 2020-03-11 NOTE — Progress Notes (Addendum)
[facility]YSICAL MEDICINE & REHABILITATION PROGRESS NOTE   Subjective/Complaints: Had UCx from foley bag , afebrile , + UA Cx multiple species Discontinued laxatives since pt has  C diff Liquid BM last noc x 2  Normal sensation in peri area when wiping  No abd pain  ROS:  Pt denies SOB, abd pain, CP, N/V, + diarrhea, incont stool last noc    Objective: No results found. Recent Labs    03/08/20 0657  WBC 10.9*  HGB 8.8*  HCT 26.6*  PLT 449*   Recent Labs    03/08/20 0657  NA 133*  K 4.1  CL 99  CO2 25  GLUCOSE 108*  BUN 6*  CREATININE 0.51  CALCIUM 8.8*    Intake/Output Summary (Last 24 hours) at 03/11/2020 0529 Last data filed at 03/10/2020 2258 Gross per 24 hour  Intake 600 ml  Output 900 ml  Net -300 ml     Physical Exam: Vital Signs Blood pressure 130/66, pulse 91, temperature 99 F (37.2 C), temperature source Oral, resp. rate 20, height 5\' 2"  (1.575 m), weight 48.8 kg, SpO2 96 %. Physical Exam   General: No acute distress Mood and affect are appropriate Heart: Regular rate and rhythm no rubs murmurs or extra sounds Lungs: Clear to auscultation, breathing unlabored, no rales or wheezes Abdomen: Positive bowel sounds, soft nontender to palpation, nondistended Extremities: No clubbing, cyanosis, or edema Skin: No evidence of breakdown, no evidence of rash  Neurologic: Cranial nerves II through XII intact, motor strength is 4/5 in bilateral deltoid, bicep, tricep, grip, hip flexor, knee extensors, ankle dorsiflexor and plantar flexor Sensory exam normal sensation to light touch butoocks and groin area   Musculoskeletal: Full range of motion in all 4 extremities. No joint swelling  Assessment/Plan: 1. Functional deficits secondary to debility from acute respiratory failure with hypoxiaextubated 02/13/2020/pulmonary emboli/acute metabolic encephalopathywhich require 3+ hours per day of interdisciplinary therapy in a comprehensive inpatient rehab  setting.  Physiatrist is providing close team supervision and 24 hour management of active medical problems listed below.  Physiatrist and rehab team continue to assess barriers to discharge/monitor patient progress toward functional and medical goals  Care Tool:  Bathing    Body parts bathed by patient: Right arm, Left arm, Chest, Abdomen, Front perineal area, Buttocks, Right upper leg, Left upper leg, Right lower leg, Left lower leg, Face         Bathing assist Assist Level: Supervision/Verbal cueing     Upper Body Dressing/Undressing Upper body dressing   What is the patient wearing?: Pull over shirt    Upper body assist Assist Level: Set up assist    Lower Body Dressing/Undressing Lower body dressing      What is the patient wearing?: Pants     Lower body assist Assist for lower body dressing: Supervision/Verbal cueing     Toileting Toileting    Toileting assist Assist for toileting: Minimal Assistance - Patient > 75%     Transfers Chair/bed transfer  Transfers assist     Chair/bed transfer assist level: Supervision/Verbal cueing     Locomotion Ambulation   Ambulation assist      Assist level: Contact Guard/Touching assist Assistive device: No Device Max distance: 50   Walk 10 feet activity   Assist     Assist level: Contact Guard/Touching assist Assistive device: No Device   Walk 50 feet activity   Assist    Assist level: Contact Guard/Touching assist Assistive device: No Device    Walk  150 feet activity   Assist Walk 150 feet activity did not occur: Safety/medical concerns         Walk 10 feet on uneven surface  activity   Assist     Assist level: Minimal Assistance - Patient > 75% Assistive device: Hand held assist   Wheelchair     Assist Will patient use wheelchair at discharge?: No             Wheelchair 50 feet with 2 turns activity    Assist            Wheelchair 150 feet activity      Assist          Blood pressure 130/66, pulse 91, temperature 99 F (37.2 C), temperature source Oral, resp. rate 20, height 5\' 2"  (1.575 m), weight 48.8 kg, SpO2 96 %.  Medical Problem List and Plan: 1.Decreased functional mobility with altered mental statussecondary to acute respiratory failure with hypoxiaextubated 02/13/2020/pulmonary emboli/acute metabolic encephalopathy   -patient may shower -ELOS/Goals: 10-12 days, supervision to mod I goals  -Continue CIR PT, OT, SLP 2. Antithrombotics: -DVT/anticoagulation:Eliquisfor pulmonary emboli -antiplatelet therapy: n/a 3. Pain Management/chroniclow backpain:pt uses her oxycodone 4x daily at home on a schedule. Will schedule her doses at 0700 and 1200 daily, and leave other doses up to her in the PM.  -has spinal cord stimulator per Dr. Rolena Infante, Dr. Nelva Bush manages her chronic pain  4/17- will schedule tramadol 50 mg q6 hours for additional pain control  4/18- is working a little better for her- explained I cannot insist pain doctor do tramadol, but can con't here.  4. Mood/panic disorder:Xanax 0.5 mg twice daily as needed -pt was anxious about coming to rehab, people she might work with. Reassured her that we will take care and that she will do well.  4/18- feeling better about rehab -antipsychotic agents: N/A 5. Neuropsych: This patientiscapable of making decisions on herown behalf. 6. Skin/Wound Care:Routine skin checks 7. Fluids/Electrolytes/Nutrition:Routine in and outs with follow-up chemistries 9. AKI. Resolved. Follow-up chemistries on Monday  4/17- Cr looks good- no dehydration, elevated labs. 4/19: continues to be well controlled.  4/23- well controlled- con't regimen  10. Acute on chronic diastolic congestive heart failure. Lasix 20 mg daily. Monitor for any signs of fluid overload. Check daily weights   Filed Weights    03/09/20 0455 03/09/20 1400 03/10/20 0459  Weight: 48.1 kg 48.5 kg 48.8 kg    Stable  11. Pancytopenia. Follow-up labs on Monday 12. Hypertension. Hydralazine 25 mg every 8 hours. Monitor with increased mobility 13. Tobacco and alcohol abuse. Continue NicoDerm patch. Provide counseling 14. Urinary retention. Urecholine 5 mg 3 times daily. Check PVR, likely due to immobility  but now requiring foley for false passage, uro f/u as OP for voiding trial No need for urecholine given foley    4/20- per nurse, foley was due to a traumatic cath- urology says keep it for ~ 2-3 weeks- will need to go home with it and f/u with Urology.   4/22- discussed with pt- will need to go home with it- had so many in/out caths- sounds like ended up with false passage. Needs Urology f/u.   UA+  From foley afeb, will hold off on abx given C diff unless pt spike temp , last CBC  Showed WBC 10.9 15. GERD. Protonix 16. Bowel incontinence/C Diff- given normal senstion and lack of stenosis on CT doubt this is spine related have   4/22- C Diff (+)- laxatives  discontinued 4/24 17. Hypokalemia: K+improved on supplements  4/22- K+ 4.1- con't to follow  LOS: 9 days A FACE TO FACE EVALUATION WAS PERFORMED  Erick Colace 03/11/2020, 5:29 AM

## 2020-03-12 ENCOUNTER — Inpatient Hospital Stay (HOSPITAL_COMMUNITY): Payer: PPO | Admitting: Occupational Therapy

## 2020-03-12 ENCOUNTER — Inpatient Hospital Stay (HOSPITAL_COMMUNITY): Payer: PPO

## 2020-03-12 MED ORDER — MAGNESIUM OXIDE 400 (241.3 MG) MG PO TABS: 400.0000 mg | ORAL_TABLET | Freq: Two times a day (BID) | ORAL | 0 refills | Status: DC

## 2020-03-12 MED ORDER — ALPRAZOLAM 0.5 MG PO TABS: 0.5000 mg | ORAL_TABLET | Freq: Two times a day (BID) | ORAL | 0 refills | Status: DC | PRN

## 2020-03-12 MED ORDER — TRAMADOL HCL 50 MG PO TABS: 50.0000 mg | ORAL_TABLET | Freq: Four times a day (QID) | ORAL | 0 refills | Status: DC

## 2020-03-12 MED ORDER — NICOTINE 21 MG/24HR TD PT24: MEDICATED_PATCH | TRANSDERMAL | 0 refills | Status: DC

## 2020-03-12 MED ORDER — OXYCODONE HCL 5 MG PO TABS: 5.0000 mg | ORAL_TABLET | Freq: Four times a day (QID) | ORAL | 0 refills | Status: DC | PRN

## 2020-03-12 MED ORDER — FOLIC ACID 1 MG PO TABS: 1.0000 mg | ORAL_TABLET | Freq: Every day | ORAL | 0 refills | Status: DC

## 2020-03-12 MED ORDER — PANTOPRAZOLE SODIUM 40 MG PO TBEC: 40.0000 mg | DELAYED_RELEASE_TABLET | Freq: Two times a day (BID) | ORAL | 11 refills | Status: DC

## 2020-03-12 MED ORDER — VANCOMYCIN 50 MG/ML ORAL SOLUTION: 125.0000 mg | Freq: Four times a day (QID) | ORAL | 0 refills | Status: DC

## 2020-03-12 MED ORDER — FUROSEMIDE 20 MG PO TABS: 20.0000 mg | ORAL_TABLET | Freq: Every day | ORAL | 0 refills | Status: DC

## 2020-03-12 MED ORDER — HYDRALAZINE HCL 25 MG PO TABS: 25.0000 mg | ORAL_TABLET | Freq: Three times a day (TID) | ORAL | 0 refills | Status: DC

## 2020-03-12 MED ORDER — APIXABAN 5 MG PO TABS: 5.0000 mg | ORAL_TABLET | Freq: Two times a day (BID) | ORAL | 0 refills | Status: DC

## 2020-03-12 MED ORDER — TRAZODONE HCL 50 MG PO TABS: 50.0000 mg | ORAL_TABLET | Freq: Every day | ORAL | 0 refills | Status: DC

## 2020-03-12 MED ORDER — VANCOMYCIN 50 MG/ML ORAL SOLUTION: 125.0000 mg | Freq: Four times a day (QID) | ORAL | 0 refills | Status: AC

## 2020-03-12 NOTE — Progress Notes (Addendum)
Social Work Patient ID: Dawn Beasley, female   DOB: [birth year], 69 y.o.   MRN: [id number]  SW met with pt in room to discuss discharge recommendations: rollator walker and HHPT/OT. SW informed DME order sent to [facility]. Preferred [facility] is [facility]. Pt would prefer to not have [facility].   SW left message for [facility] [phone]) abotu HHPT/OT referral. SW waiting on follow-up. *referral accepted. SW faxed referral to [facility] [phone].  Loralee Pacas, MSW, [place] Office: [phone] Cell: [phone] Fax: [phone]

## 2020-03-12 NOTE — Progress Notes (Signed)
Occupational Therapy Discharge Summary  Patient Details  Name: Dawn Beasley MRN: [id number] Date of Birth: [birth year]  Today's Date: 03/12/2020 OT Individual Time: 3570-1779 OT Individual Time Calculation (min): 70 min   Patient in bed, she states that she is having a great day and is happy to participate in therapy session as planned.  Reviewed DME for home - yesterday discussed tub bench for garden tub but this will not fit as anticipated.  Husband is going to purchase a shower seat - encouraged to practice with [facility] prior to use.  She completed bed mobility independently, functional ambulation in room with rollator mod I, CS for transfer to shower seat.  She completes shower and adl tasks DS/mod I with increased time and seated for energy conservation.  She is able to manage catheter bag without difficulty.  Reviewed discharge plan and strategies for safety in home environment - she is able to recall suggestions and demonstrate safe solutions.   She returned to bed at close of session for rest break mod I.  Bed alarm set and call bell in reach.  Patient has met 9 of 9 long term goals due to improved activity tolerance, improved balance, postural control and improved coordination.  Patient to discharge at overall Modified Independent level.  Patient's care partner is independent to provide the necessary physical assistance at discharge.    Reasons goals not met: na  Recommendation:  Patient will benefit from ongoing skilled OT services in home health setting to continue to advance functional skills in the area of BADL, iADL and Reduce care partner burden.  Equipment: husband to purchase shower seat  Reasons for discharge: treatment goals met  Patient/family agrees with progress made and goals achieved: Yes  OT Discharge Precautions/Restrictions  Precautions Precautions: Fall Precaution Comments: C-diff and decreased activity tolerance Restrictions Weight Bearing Restrictions:  No  Pain Pain Assessment Pain Scale: 0-10 Pain Score: 3  Pain Location: Back Pain Orientation: Lower Pain Descriptors / Indicators: Discomfort Pain Intervention(s): Repositioned;Shower ADL ADL Eating: Independent Where Assessed-Eating: Chair Grooming: Independent Where Assessed-Grooming: Sitting at sink Upper Body Bathing: Independent Where Assessed-Upper Body Bathing: Shower Lower Body Bathing: Modified independent Where Assessed-Lower Body Bathing: Shower Upper Body Dressing: Independent Where Assessed-Upper Body Dressing: Chair Lower Body Dressing: Modified independent Where Assessed-Lower Body Dressing: Chair Toileting: Modified independent Where Assessed-Toileting: Glass blower/designer: Diplomatic Services operational officer Method: Counselling psychologist: Raised toilet seat, Energy manager: Modified independent Social research officer, government Method: Ambulating Vision Baseline Vision/History: Wears glasses Wears Glasses: At all times Patient Visual Report: No change from baseline Vision Assessment?: No apparent visual deficits Perception  Perception: Within Functional Limits Praxis Praxis: Intact Cognition Overall Cognitive Status: Within Functional Limits for tasks assessed Arousal/Alertness: Awake/alert Orientation Level: Oriented X4 Focused Attention: Appears intact Sustained Attention: Appears intact Memory: Appears intact Awareness: Appears intact Problem Solving: Appears intact Safety/Judgment: Appears intact Sensation Sensation Light Touch: Appears Intact Peripheral sensation comments: Decreased sensation in B feet resolved at d/c Proprioception: Appears Intact Coordination Gross Motor Movements are Fluid and Coordinated: No Fine Motor Movements are Fluid and Coordinated: Yes Coordination and Movement Description: impaired due to balance impairments and general deconditioning, improved at d/c Heel Shin Test: B WFL Motor   Motor Motor - Discharge Observations: Improved overall, some limited progress due to C-diff and chronic back pain Mobility  Bed Mobility Rolling Right: Independent Rolling Left: Independent Supine to Sit: Independent Sit to Supine: Independent Transfers Sit to Stand: Independent Stand to Sit:  Independent  Trunk/Postural Assessment  Postural Control Righting Reactions: delayed Protective Responses: delayed Postural Limitations: decreased  Balance Static Sitting Balance Static Sitting - Balance Support: No upper extremity supported;Feet unsupported Static Sitting - Level of Assistance: 7: Independent Dynamic Sitting Balance Dynamic Sitting - Balance Support: No upper extremity supported;Feet supported;During functional activity Dynamic Sitting - Level of Assistance: 7: Independent Dynamic Sitting - Balance Activities: Lateral lean/weight shifting;Forward lean/weight shifting;Reaching for objects Static Standing Balance Static Standing - Balance Support: No upper extremity supported;During functional activity Static Standing - Level of Assistance: 7: Independent Dynamic Standing Balance Dynamic Standing - Balance Support: Right upper extremity supported;Left upper extremity supported;During functional activity Dynamic Standing - Level of Assistance: 6: Modified independent (Device/Increase time) Dynamic Standing - Balance Activities: Lateral lean/weight shifting;Forward lean/weight shifting;Reaching for objects Extremity/Trunk Assessment RUE Assessment RUE Assessment: Within Functional Limits LUE Assessment LUE Assessment: Within Functional Limits   Carlos Levering 03/12/2020, 1:38 PM

## 2020-03-12 NOTE — Progress Notes (Signed)
Occupational Therapy Session Note  Patient Details  Name: Dawn Beasley MRN: [id number] Date of Birth: March 02, 1951  Today's Date: 03/12/2020 OT Individual Time: 0802-0900 OT Individual Time Calculation (min): 58 min    Short Term Goals: Week 1:  OT Short Term Goal 1 (Week 1): Patient will complete LB dressing with supervision. OT Short Term Goal 2 (Week 1): Patient will complete toilet transfers with supervision and min cues for technique. OT Short Term Goal 3 (Week 1): Patient will engage in functional activity in standing for 3 minutes without rest break to increase functional endurance for ADLs.  Skilled Therapeutic Interventions/Progress Updates:  Patient met lying supine in bed in agreement with OT treatment session with focus on self-care re-education, household functional mobility and safety with rollator as detailed below. Pain 7.5/10 in low back (premedicated) at rest and with activity. Supine to EOB with verbal cues for use of log rolling technique for pain management. Patient able to gather ADL items from dresser with cues for safety with rollator including locking/unlocking breaks and positioning to manage low back pain. Oral hygiene and grooming tasks with patient initially standing at sink level with rollator positioned behind. Patient required seated rest break before completion of task secondary to fatigue and back pain. Functional mobility to and from ADL apartment with use of rollator and no seated rest breaks. In ADL apartment OT provided education on safety with rollator including safety with reaching/bending, storage of frequently used items, and locking/unlocking breaks before sitting/standing. Patient engaged in kitchen task to unload dishwasher, initially requiring Mod vc's for safety. At end of session, patient able to recall safety information with good return demonstration using rollator and supervision A. Session concluded with patient seated in recliner with call bell  within reach and all needs met.    Therapy Documentation Precautions:  Precautions Precautions: Fall Precaution Comments: C-diff and decreased activity tolerance Restrictions Weight Bearing Restrictions: No  Therapy/Group: Individual Therapy  Dreyson Mishkin R Howerton-Davis 03/12/2020, 7:50 AM

## 2020-03-12 NOTE — Discharge Instructions (Signed)
Inpatient Rehab Discharge Instructions  Dawn Beasley Discharge date and time: No discharge date for patient encounter.   Activities/Precautions/ Functional Status: Activity: activity as tolerated Diet: regular diet Wound Care: none needed Functional status:  ___ No restrictions     ___ Walk up steps independently ___ 24/7 supervision/assistance   ___ Walk up steps with assistance ___ Intermittent supervision/assistance  ___ Bathe/dress independently ___ Walk with walker     _x__ Bathe/dress with assistance ___ Walk Independently    ___ Shower independently ___ Walk with assistance    ___ Shower with assistance ___ No alcohol     ___ Return to work/school ________   COMMUNITY REFERRALS UPON DISCHARGE:    Home Health:   PT     OT                Agency:[facility] Phone:     Medical Equipment/Items Ordered: rollator walker                                                 Agency/Supplier: [facility] [phone]    Special Instructions: No driving smoking or alcohol   My questions have been answered and I understand these instructions. I will adhere to these goals and the provided educational materials after my discharge from the hospital.  Patient/Caregiver Signature _______________________________ Date __________  Clinician Signature _______________________________________ Date __________  Please bring this form and your medication list with you to all your follow-up doctor's appointments.   Information on my medicine - ELIQUIS (apixaban)  This medication education was reviewed with me or my healthcare representative as part of my discharge preparation.    Why was Eliquis prescribed for you? Eliquis was prescribed to treat blood clots that may have been found in the veins of your legs (deep vein thrombosis) or in your lungs (pulmonary embolism) and to reduce the risk of them occurring again.  What do You need to know about Eliquis ? Your  dose is 5 mg (one 5 mg tablets) taken TWICE daily.  Eliquis may be taken with or without food.   Try to take the dose about the same time in the morning and in the evening. If you have difficulty swallowing the tablet whole please discuss with your pharmacist how to take the medication safely.  Take Eliquis exactly as prescribed and DO NOT stop taking Eliquis without talking to the doctor who prescribed the medication.  Stopping may increase your risk of developing a new blood clot.  Refill your prescription before you run out.  After discharge, you should have regular check-up appointments with your healthcare provider that is prescribing your Eliquis.    What do you do if you miss a dose? If a dose of ELIQUIS is not taken at the scheduled time, take it as soon as possible on the same day and twice-daily administration should be resumed. The dose should not be doubled to make up for a missed dose.  Important Safety Information A possible side effect of Eliquis is bleeding. You should call your healthcare provider right away if you experience any of the following: ? Bleeding from an injury or your nose that does not stop. ? Unusual colored urine (red or dark brown) or unusual colored stools (red or black). ? Unusual bruising for unknown reasons. ? A serious fall or if you hit  your head (even if there is no bleeding).  Some medicines may interact with Eliquis and might increase your risk of bleeding or clotting while on Eliquis. To help avoid this, consult your healthcare provider or pharmacist prior to using any new prescription or non-prescription medications, including herbals, vitamins, non-steroidal anti-inflammatory drugs (NSAIDs) and supplements.  This website has more information on Eliquis (apixaban): http://[email or web address].com/eliquis/home

## 2020-03-12 NOTE — Progress Notes (Signed)
Physical Therapy Discharge Summary  Patient Details  Name: Dawn Beasley MRN: [id number] Date of Birth: 1951-06-20  Today's Date: 03/12/2020 PT Individual Time: 1003-1100 PT Individual Time Calculation (min): 57 min    Patient has met 10 of 10 long term goals due to improved activity tolerance, improved balance, improved postural control, increased strength, decreased pain and ability to compensate for deficits.  Patient to discharge at an ambulatory level Modified Independent.   Patient's care partner is independent to provide the necessary physical assistance at discharge.  Reasons goals not met: n/a  Recommendation:  Patient will benefit from ongoing skilled PT services in home health setting to continue to advance safe functional mobility, address ongoing impairments in balance, strength, activity tolerance, functional mobility, patient/caregiver education, and minimize fall risk.  Equipment: rollator  Reasons for discharge: treatment goals met  Patient/family agrees with progress made and goals achieved: Yes  Skilled Therapeutic Intervention: Patient in recliner in the room upon PT arrival. Patient alert and agreeable to PT session. Patient reported 6-7/10 back pain during session, patient was pre-medicated prior to session. PT provided repositioning, rest breaks, and distraction as pain interventions throughout session.   Therapeutic Activity: Bed Mobility: Patient performed supine to/from sit independently in a flat bed without use of bed rails. Simulated home set up with elevated bed height. Transfers: Patient performed sit to/from stand x4 with a rollator, including 2 stand turn transfers from the rollator, independently with safe use of breaks without cues.   Gait Training:  Patient ambulated 250 feet and 110 feet using a rollator with mod I, patient able to determine when to sit based on fatigue levels. HR 110-120 after each trial. Ambulated as described below.    Therapeutic Exercise: Patient performed 1 set of the following exercises from [facility] provided with handout with verbal and tactile cues for proper technique. Access Code: [email or web address]:  Supine Transversus Abdominis Bracing - Hands on Stomach - 2-3 x daily - 7 x weekly - 2 sets - 10 reps - 5 sec hold  Supine March with Posterior Pelvic Tilt - 2-3 x daily - 7 x weekly - 2 sets - 10 reps  Supine Transversus Abdominis Bracing with Double Leg Fallout - 2-3 x daily - 7 x weekly - 2 sets - 10 reps  Supine Lower Trunk Rotation - 2-3 x daily - 7 x weekly - 2 sets - 10 reps  Seated Hamstring Stretch with Strap - 2-3 x daily - 7 x  weekly - 2 sets - 1 reps - 30 sec hold  Patient in bed at end of session with breaks locked, bed alarm set, and all needs within reach. Educated on home safety and energy conservation. Discussed use of rollator when ambulating for the car to the house. Patient receptive to all education.   PT Discharge Precautions/Restrictions Precautions Precaution Comments: C-diff and decreased activity tolerance Restrictions Weight Bearing Restrictions: No Vision/Perception  Perception Perception: Within Functional Limits Praxis Praxis: Intact  Cognition Overall Cognitive Status: Within Functional Limits for tasks assessed Arousal/Alertness: Awake/alert Focused Attention: Appears intact Sustained Attention: Appears intact Memory: Appears intact Awareness: Appears intact Problem Solving: Appears intact Safety/Judgment: Appears intact Sensation Sensation Light Touch: Appears Intact Coordination Fine Motor Movements are Fluid and Coordinated: Yes Coordination and Movement Description: impaired due to balance impairments and general deconditioning, improved at d/c Motor  Motor Motor - Discharge Observations: Improved overall, some limited progress due to C-diff and chronic back pain  Mobility Bed Mobility Rolling Right: Independent Rolling Left: Independent  Supine to Sit:  Independent Sit to Supine: Independent Transfers Sit to Stand: Independent Stand to Sit: Independent Stand Pivot Transfers: Independent Transfer (Assistive device): Rollator Locomotion  Gait Ambulation: Yes Gait Assistance: Independent with assistive device Gait Distance (Feet): 250 Feet Assistive device: Rollator Gait Gait: Yes Gait Pattern: Decreased stride length;Decreased hip/knee flexion - right;Decreased hip/knee flexion - left;Left flexed knee in stance;Right flexed knee in stance;Trunk flexed;Decreased trunk rotation;Narrow base of support Gait velocity: decreased Stairs / Additional Locomotion Stairs: Yes Stairs Assistance: Contact Guard/Touching assist Stair Management Technique: One rail Right(with B UE support) Number of Stairs: 3 Height of Stairs: 6 Ramp: Contact Guard/touching assist(with RW) Curb: Contact Guard/Touching assist(with RW) Wheelchair Mobility Wheelchair Mobility: No(Patient is a Engineer, petroleum)  Trunk/Postural Assessment  Cervical Assessment Cervical Assessment: Exceptions to WFL(forward head) Thoracic Assessment Thoracic Assessment: Exceptions to WFL(thoracic rounding) Lumbar Assessment Lumbar Assessment: Exceptions to WFL(posterior pelvic tilt in sitting) Postural Control Postural Control: Deficits on evaluation Righting Reactions: delayed Protective Responses: delayed Postural Limitations: decreased  Balance Static Sitting Balance Static Sitting - Balance Support: No upper extremity supported;Feet unsupported Static Sitting - Level of Assistance: 7: Independent Dynamic Sitting Balance Dynamic Sitting - Balance Support: No upper extremity supported;Feet supported;During functional activity Dynamic Sitting - Level of Assistance: 7: Independent Dynamic Sitting - Balance Activities: Lateral lean/weight shifting;Forward lean/weight shifting;Reaching for objects Static Standing Balance Static Standing - Balance Support: No upper extremity  supported;During functional activity Static Standing - Level of Assistance: 7: Independent Dynamic Standing Balance Dynamic Standing - Balance Support: Right upper extremity supported;Left upper extremity supported;During functional activity Dynamic Standing - Level of Assistance: 6: Modified independent (Device/Increase time) Dynamic Standing - Balance Activities: Lateral lean/weight shifting;Forward lean/weight shifting;Reaching for objects Extremity Assessment  RUE Assessment RUE Assessment: Within Functional Limits LUE Assessment LUE Assessment: Within Functional Limits RLE Assessment RLE Assessment: Exceptions to [facility] Active Range of Motion (AROM) Comments: WFL RLE Strength Right Hip Flexion: 4+/5(limited by back pain) Right Knee Flexion: 5/5 Right Knee Extension: 4+/5(limited by back pain) Right Ankle Dorsiflexion: 5/5 Right Ankle Plantar Flexion: 4+/5 LLE Assessment LLE Assessment: Exceptions to [facility] Active Range of Motion (AROM) Comments: WFL LLE Strength Left Hip Flexion: 4+/5(limited by back pain) Left Knee Flexion: 5/5 Left Knee Extension: 4+/5(limited by back pain) Left Ankle Dorsiflexion: 5/5 Left Ankle Plantar Flexion: 4+/5    Rusell Meneely L Amanuel Sinkfield PT, DPT  03/12/2020, 3:54 PM

## 2020-03-12 NOTE — Progress Notes (Signed)
[place] PHYSICAL MEDICINE & REHABILITATION PROGRESS NOTE   Subjective/Complaints:  Pt reports she's excited her d/c is scheduled for tomorrow.   We discussed the bowel incontinence issue- she thinks it's from C Diff, which it could be, but combined with urinary retention, is concerning.   Still no control of stool- having bowel accidents ~ 1-2x/day- when having BMs.   Had refused bowel program since last week.     ROS:   Pt denies SOB, abd pain, CP, N/V/C/- diarrhea has improved, and vision changes    Objective: No results found. No results for input(s): WBC, HGB, HCT, PLT in the last 72 hours. No results for input(s): NA, K, CL, CO2, GLUCOSE, BUN, CREATININE, CALCIUM in the last 72 hours.  Intake/Output Summary (Last 24 hours) at 03/12/2020 0911 Last data filed at 03/11/2020 2011 Gross per 24 hour  Intake 480 ml  Output 450 ml  Net 30 ml     Physical Exam: Vital Signs Blood pressure 137/74, pulse (!) 110, temperature 98.6 F (37 C), temperature source Oral, resp. rate 18, height 5\' 2"  (1.575 m), weight 48.3 kg, SpO2 95 %. Physical Exam   General: sitting up in bed, appropriate, NAD Mood standoffish this AM Heart: tachycardic- regular rhythm Lungs: CTA b/L Abdomen:Soft, NT, ND, (+)BS hyperactive Extremities: No clubbing, cyanosis, or edema Skin: No evidence of breakdown, no evidence of rash  Neurologic: Cranial nerves II through XII intact, motor strength is 4/5 in bilateral deltoid, bicep, tricep, grip, hip flexor, knee extensors, ankle dorsiflexor and plantar flexor Sensory exam normal sensation to light touch buttocks and groin area   Musculoskeletal: Full range of motion in all 4 extremities. No joint swelling  Assessment/Plan: 1. Functional deficits secondary to debility from acute respiratory failure with hypoxiaextubated 02/13/2020/pulmonary emboli/acute metabolic encephalopathywhich require 3+ hours per day of interdisciplinary therapy in a  comprehensive inpatient rehab setting.  Physiatrist is providing close team supervision and 24 hour management of active medical problems listed below.  Physiatrist and rehab team continue to assess barriers to discharge/monitor patient progress toward functional and medical goals  Care Tool:  Bathing    Body parts bathed by patient: Right arm, Left arm, Chest, Abdomen, Front perineal area, Buttocks, Right upper leg, Left upper leg, Right lower leg, Left lower leg, Face         Bathing assist Assist Level: Supervision/Verbal cueing     Upper Body Dressing/Undressing Upper body dressing   What is the patient wearing?: Pull over shirt    Upper body assist Assist Level: Independent with assistive device    Lower Body Dressing/Undressing Lower body dressing      What is the patient wearing?: Pants     Lower body assist Assist for lower body dressing: Independent with assitive device     Toileting Toileting    Toileting assist Assist for toileting: Minimal Assistance - Patient > 75%     Transfers Chair/bed transfer  Transfers assist     Chair/bed transfer assist level: Independent with assistive device Chair/bed transfer assistive device: Other(Rollator)   Locomotion Ambulation   Ambulation assist      Assist level: Supervision/Verbal cueing Assistive device: Walker-rolling Max distance: 50 ft   Walk 10 feet activity   Assist     Assist level: Supervision/Verbal cueing Assistive device: Walker-rolling   Walk 50 feet activity   Assist    Assist level: Supervision/Verbal cueing Assistive device: Walker-rolling    Walk 150 feet activity   Assist Walk 150 feet activity did  not occur: Safety/medical concerns         Walk 10 feet on uneven surface  activity   Assist     Assist level: Contact Guard/Touching assist Assistive device: Walker-rolling   Wheelchair     Assist Will patient use wheelchair at discharge?: No              Wheelchair 50 feet with 2 turns activity    Assist            Wheelchair 150 feet activity     Assist          Blood pressure 137/74, pulse (!) 110, temperature 98.6 F (37 C), temperature source Oral, resp. rate 18, height 5\' 2"  (1.575 m), weight 48.3 kg, SpO2 95 %.  Medical Problem List and Plan: 1.Decreased functional mobility with altered mental statussecondary to acute respiratory failure with hypoxiaextubated 02/13/2020/pulmonary emboli/acute metabolic encephalopathy   -patient may shower -ELOS/Goals: 10-12 days, supervision to mod I goals  -Continue CIR PT, OT, SLP 2. Antithrombotics: -DVT/anticoagulation:Eliquisfor pulmonary emboli -antiplatelet therapy: n/a 3. Pain Management/chroniclow backpain:pt uses her oxycodone 4x daily at home on a schedule. Will schedule her doses at 0700 and 1200 daily, and leave other doses up to her in the PM.  -has spinal cord stimulator per Dr. Rolena Infante, Dr. Nelva Bush manages her chronic pain  4/17- will schedule tramadol 50 mg q6 hours for additional pain control  4/18- is working a little better for her- explained I cannot insist pain doctor do tramadol, but can con't here.  4. Mood/panic disorder:Xanax 0.5 mg twice daily as needed -pt was anxious about coming to rehab, people she might work with. Reassured her that we will take care and that she will do well.  4/18- feeling better about rehab -antipsychotic agents: N/A 5. Neuropsych: This patientiscapable of making decisions on herown behalf. 6. Skin/Wound Care:Routine skin checks 7. Fluids/Electrolytes/Nutrition:Routine in and outs with follow-up chemistries 9. AKI. Resolved. Follow-up chemistries on Monday  4/17- Cr looks good- no dehydration, elevated labs. 4/19: continues to be well controlled.  4/23- well controlled- con't regimen  10. Acute on chronic diastolic congestive  heart failure. Lasix 20 mg daily. Monitor for any signs of fluid overload. Check daily weights   Filed Weights   03/10/20 0459 03/11/20 0544 03/12/20 0509  Weight: 48.8 kg 46.4 kg 48.3 kg    4/26- weight variable yesterday but back to nml 11. Pancytopenia. Follow-up labs on Monday 12. Hypertension. Hydralazine 25 mg every 8 hours. Monitor with increased mobility 13. Tobacco and alcohol abuse. Continue NicoDerm patch. Provide counseling 14. Urinary retention. Urecholine 5 mg 3 times daily. Check PVR, likely due to immobility  but now requiring foley for false passage, uro f/u as OP for voiding trial No need for urecholine given foley  4/22- discussed with pt- will need to go home with it- had so many in/out caths- sounds like ended up with false passage. Needs Urology f/u.   UA+  From foley afeb, will hold off on abx given C diff unless pt spike temp , last CBC  Showed WBC 10.9 15. GERD. Protonix 16. Bowel incontinence/C Diff- given normal senstion and lack of stenosis on CT doubt this is spine related have   4/22- C Diff (+)- laxatives discontinued 4/24 17. Hypokalemia: K+improved on supplements  4/22- K+ 4.1- con't to follow  LOS: 10 days A FACE TO FACE EVALUATION WAS PERFORMED  Yosgar Demirjian 03/12/2020, 9:11 AM

## 2020-03-13 NOTE — Progress Notes (Signed)
Patient discharged. Left floor via WC accompanied by husband, NT x2. All patient belongings with patient and leg drainage bag applied by this nurse to foley. Patient and husband educated to leg bag usage and drainage, as well as care for infection prevention.

## 2020-03-13 NOTE — Progress Notes (Signed)
Dawn Beasley PHYSICAL MEDICINE & REHABILITATION PROGRESS NOTE   Subjective/Complaints:   Pt has d/c today- it's also her birthday- wished her happy birthday  No additional issues today- still having bowel incontinence with every BM.   ROS:  Pt denies SOB, abd pain, CP, N/V/C/D, and vision changes    Objective: No results found. No results for input(s): WBC, HGB, HCT, PLT in the last 72 hours. No results for input(s): NA, K, CL, CO2, GLUCOSE, BUN, CREATININE, CALCIUM in the last 72 hours.  Intake/Output Summary (Last 24 hours) at 03/13/2020 0913 Last data filed at 03/13/2020 0750 Gross per 24 hour  Intake 420 ml  Output 1700 ml  Net -1280 ml     Physical Exam: Vital Signs Blood pressure (!) 141/85, pulse 95, temperature 99.3 F (37.4 C), temperature source Oral, resp. rate 18, height 5\' 2"  (1.575 m), weight 49.1 kg, SpO2 95 %. Physical Exam   General: laying supine in bed; appropriate, NAD; RN in room Mood brighter affect today Heart:borderline tachycardia- regular rhythm Lungs: CTA B/L Abdomen:Soft, NT, ND, (+)BS  Extremities: No clubbing, cyanosis, or edema Skin: No evidence of breakdown, no evidence of rash  Neurologic:  motor strength is 4/5 in bilateral deltoid, bicep, tricep, grip, hip flexor, knee extensors, ankle dorsiflexor and plantar flexor Sensory exam normal sensation to light touch buttocks and groin area   Musculoskeletal: Full range of motion in all 4 extremities. No joint swelling   Assessment/Plan: 1. Functional deficits secondary to debility from acute respiratory failure with hypoxiaextubated 02/13/2020/pulmonary emboli/acute metabolic encephalopathywhich require 3+ hours per day of interdisciplinary therapy in a comprehensive inpatient rehab setting.  Physiatrist is providing close team supervision and 24 hour management of active medical problems listed below.  Physiatrist and rehab team continue to assess barriers to discharge/monitor patient  progress toward functional and medical goals  Care Tool:  Bathing    Body parts bathed by patient: Right arm, Left arm, Chest, Abdomen, Front perineal area, Buttocks, Right upper leg, Left upper leg, Right lower leg, Left lower leg, Face         Bathing assist Assist Level: Independent with assistive device     Upper Body Dressing/Undressing Upper body dressing   What is the patient wearing?: Pull over shirt    Upper body assist Assist Level: Independent with assistive device    Lower Body Dressing/Undressing Lower body dressing      What is the patient wearing?: Pants, Underwear/pull up     Lower body assist Assist for lower body dressing: Independent with assitive device     Toileting Toileting    Toileting assist Assist for toileting: Independent with assistive device     Transfers Chair/bed transfer  Transfers assist     Chair/bed transfer assist level: Independent with assistive device Chair/bed transfer assistive device: (rollator)   Locomotion Ambulation   Ambulation assist      Assist level: Independent with assistive device Assistive device: Rollator Max distance: 250'   Walk 10 feet activity   Assist     Assist level: Independent with assistive device Assistive device: Rollator   Walk 50 feet activity   Assist    Assist level: Independent with assistive device Assistive device: Rollator    Walk 150 feet activity   Assist Walk 150 feet activity did not occur: Safety/medical concerns  Assist level: Independent with assistive device Assistive device: Rollator    Walk 10 feet on uneven surface  activity   Assist     Assist level:  Contact Guard/Touching assist Assistive device: Photographer Will patient use wheelchair at discharge?: 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 rate 18, height 5\' 2"  (1.575 m), weight 49.1 kg, SpO2 95 %.  Medical Problem List and Plan: 1.Decreased functional mobility with altered mental statussecondary to acute respiratory failure with hypoxiaextubated 02/13/2020/pulmonary emboli/acute metabolic encephalopathy   -patient may shower -ELOS/Goals: 10-12 days, supervision to mod I goals  -Continue CIR PT, OT, SLP 2. Antithrombotics: -DVT/anticoagulation:Eliquisfor pulmonary emboli -antiplatelet therapy: n/a 3. Pain Management/chroniclow backpain:pt uses her oxycodone 4x daily at home on a schedule. Will schedule her doses at 0700 and 1200 daily, and leave other doses up to her in the PM.  -has spinal cord stimulator per Dr. 02/15/2020, Dr. Shon Baton manages her chronic pain  4/17- will schedule tramadol 50 mg q6 hours for additional pain control  4/18- is working a little better for her- explained I cannot insist pain doctor do tramadol, but can con't here.  4. Mood/panic disorder:Xanax 0.5 mg twice daily as needed -pt was anxious about coming to rehab, people she might work with. Reassured her that we will take care and that she will do well.  4/18- feeling better about rehab -antipsychotic agents: N/A 5. Neuropsych: This patientiscapable of making decisions on herown behalf. 6. Skin/Wound Care:Routine skin checks 7. Fluids/Electrolytes/Nutrition:Routine in and outs with follow-up chemistries 9. AKI. Resolved. Follow-up chemistries on Monday  4/17- Cr looks good- no dehydration, elevated labs. 4/19: continues to be well controlled.  4/23- well controlled- con't regimen  10. Acute  on chronic diastolic congestive heart failure. Lasix 20 mg daily. Monitor for any signs of fluid overload. Check daily weights   Filed Weights   03/11/20 0544 03/12/20 0509 03/13/20 0500  Weight: 46.4 kg 48.3 kg 49.1 kg    4/26- weight variable yesterday but back to nml 4/27- weight slightly up to 49 kg- which is was at admission.  11. Pancytopenia. Follow-up labs on Monday 12. Hypertension. Hydralazine 25 mg every 8 hours. Monitor with increased mobility 13. Tobacco and alcohol abuse. Continue NicoDerm patch. Provide counseling 14. Urinary retention. Urecholine 5 mg 3 times daily. Check PVR, likely due to immobility  but now requiring foley for false passage, uro f/u as OP for voiding trial No need for urecholine given foley  4/22- discussed with pt- will need to go home with it- had so many in/out caths- sounds like ended up with false passage. Needs Urology f/u.   UA+  From foley afeb, will hold off on abx given C diff unless pt spike temp , last CBC  Showed WBC 10.9  4/27- will send home with f/u with urology.  15. GERD. Protonix 16. Bowel incontinence/C Diff- given normal senstion and lack of stenosis on CT   4/27- had bowel incontinence AND urinary retention for 2 + weeks prior to rehab admission- so not sure it has to do with C Diff that she' has now- or at least not all to do  with C Diff. But pt advised to call spine surgeon Dr Shon Baton if it doesn't improve- have attempted to contact Dr Shon Baton- no return call.  have   4/22- C Diff (+)- laxatives discontinued 4/24 17. Hypokalemia: K+improved on supplements  4/22- K+ 4.1- con't to follow  LOS: 11 days A FACE TO FACE EVALUATION WAS PERFORMED  Diany Formosa 03/13/2020, 9:13 AM

## 2020-03-13 NOTE — Plan of Care (Signed)
  Problem: Consults Goal: RH GENERAL PATIENT EDUCATION Description: See Patient Education module for education specifics. Outcome: Completed/Met Goal: Skin Care Protocol Initiated - if Braden Score 18 or less Description: If consults are not indicated, leave blank or document N/A Outcome: Completed/Met   Problem: RH BOWEL ELIMINATION Goal: RH STG MANAGE BOWEL WITH ASSISTANCE Description: STG Manage Bowel with min Assistance. Outcome: Completed/Met Goal: RH STG MANAGE BOWEL W/MEDICATION W/ASSISTANCE Description: STG Manage Bowel with Medication with min Assistance. Outcome: Completed/Met   Problem: RH BLADDER ELIMINATION Goal: RH STG MANAGE BLADDER WITH ASSISTANCE Description: STG Manage Bladder With min Assistance Outcome: Completed/Met Goal: RH STG MANAGE BLADDER WITH MEDICATION WITH ASSISTANCE Description: STG Manage Bladder With Medication With min Assistance. Outcome: Completed/Met   Problem: RH SKIN INTEGRITY Goal: RH STG MAINTAIN SKIN INTEGRITY WITH ASSISTANCE Description: STG Maintain Skin Integrity With Supervision Assistance. Outcome: Completed/Met Goal: RH STG ABLE TO PERFORM INCISION/WOUND CARE W/ASSISTANCE Description: STG Able To Perform Incision/Wound Care With supervision Assistance. Outcome: Completed/Met   Problem: RH SAFETY Goal: RH STG ADHERE TO SAFETY PRECAUTIONS W/ASSISTANCE/DEVICE Description: STG Adhere to Safety Precautions With min Assistance appropriate assistive Device. Outcome: Completed/Met   Problem: RH PAIN MANAGEMENT Goal: RH STG PAIN MANAGED AT OR BELOW PT'S PAIN GOAL Description: <4 on a 0-10 pain scale  Outcome: Completed/Met   Problem: RH KNOWLEDGE DEFICIT GENERAL Goal: RH STG INCREASE KNOWLEDGE OF SELF CARE AFTER HOSPITALIZATION Description: Patient will be able to demonstrate knowledge of care including but not limited to medication management, dietary management, skin care, and follow up care with the MD post discharge with min assist  from CIR staff. Outcome: Completed/Met

## 2020-03-14 NOTE — Progress Notes (Signed)
Social Work Discharge Note   The overall goal for the admission was met for:   Discharge location: Yes. D/c to home with her husband who will provide intermittent support due to work.   Length of Stay: Yes. 11 days.  Discharge activity level: Yes. Mod I.  Home/community participation: Yes. Limited.  Services provided included: MD, RD, PT, OT, RN, CM, TR, Pharmacy, Neuropsych and SW  Financial Services: Private Insurance: [employer]  Follow-up services arranged: Home Health: [facility] for HHPT/OT, DME: [employer]- rollator walker and Patient/Family request agency HH: [facility], DME: N/A  Comments (or additional information): contact pt [phone] or pt husband Jenny Reichmann [phone]  Patient/Family verbalized understanding of follow-up arrangements: Yes  Individual responsible for coordination of the follow-up plan: Pt will have assistance from her husband with coordinating care needs.   Confirmed correct DME delivered: Rana Snare 03/14/2020    Rana Snare

## 2020-03-15 ENCOUNTER — Telehealth: Payer: Self-pay

## 2020-03-15 NOTE — Telephone Encounter (Signed)
Theron Arista, PT from [facility] called stating he was scheduled to do patient eval but pt husband asked for Theron Arista to come Friday.

## 2020-03-16 ENCOUNTER — Telehealth: Payer: Self-pay

## 2020-03-16 DIAGNOSIS — F329 Major depressive disorder, single episode, unspecified: Secondary | ICD-10-CM | POA: Diagnosis not present

## 2020-03-16 DIAGNOSIS — M961 Postlaminectomy syndrome, not elsewhere classified: Secondary | ICD-10-CM | POA: Diagnosis not present

## 2020-03-16 DIAGNOSIS — E7800 Pure hypercholesterolemia, unspecified: Secondary | ICD-10-CM | POA: Diagnosis not present

## 2020-03-16 DIAGNOSIS — I110 Hypertensive heart disease with heart failure: Secondary | ICD-10-CM | POA: Diagnosis not present

## 2020-03-16 DIAGNOSIS — M48061 Spinal stenosis, lumbar region without neurogenic claudication: Secondary | ICD-10-CM | POA: Diagnosis not present

## 2020-03-16 DIAGNOSIS — A0472 Enterocolitis due to Clostridium difficile, not specified as recurrent: Secondary | ICD-10-CM | POA: Diagnosis not present

## 2020-03-16 DIAGNOSIS — J449 Chronic obstructive pulmonary disease, unspecified: Secondary | ICD-10-CM | POA: Diagnosis not present

## 2020-03-16 DIAGNOSIS — F17210 Nicotine dependence, cigarettes, uncomplicated: Secondary | ICD-10-CM | POA: Diagnosis not present

## 2020-03-16 DIAGNOSIS — I2699 Other pulmonary embolism without acute cor pulmonale: Secondary | ICD-10-CM | POA: Diagnosis not present

## 2020-03-16 DIAGNOSIS — I714 Abdominal aortic aneurysm, without rupture: Secondary | ICD-10-CM | POA: Diagnosis not present

## 2020-03-16 DIAGNOSIS — G9341 Metabolic encephalopathy: Secondary | ICD-10-CM | POA: Diagnosis not present

## 2020-03-16 DIAGNOSIS — F410 Panic disorder [episodic paroxysmal anxiety] without agoraphobia: Secondary | ICD-10-CM | POA: Diagnosis not present

## 2020-03-16 DIAGNOSIS — I5033 Acute on chronic diastolic (congestive) heart failure: Secondary | ICD-10-CM | POA: Diagnosis not present

## 2020-03-16 NOTE — Telephone Encounter (Signed)
Theron Arista, PT/[facility] called requesting VO for HHPT 2wk2, 1wk2. Orders approved and given.

## 2020-03-17 DIAGNOSIS — G9341 Metabolic encephalopathy: Secondary | ICD-10-CM | POA: Diagnosis not present

## 2020-03-17 DIAGNOSIS — M48061 Spinal stenosis, lumbar region without neurogenic claudication: Secondary | ICD-10-CM | POA: Diagnosis not present

## 2020-03-17 DIAGNOSIS — I714 Abdominal aortic aneurysm, without rupture: Secondary | ICD-10-CM | POA: Diagnosis not present

## 2020-03-17 DIAGNOSIS — E7800 Pure hypercholesterolemia, unspecified: Secondary | ICD-10-CM | POA: Diagnosis not present

## 2020-03-17 DIAGNOSIS — M961 Postlaminectomy syndrome, not elsewhere classified: Secondary | ICD-10-CM | POA: Diagnosis not present

## 2020-03-17 DIAGNOSIS — A0472 Enterocolitis due to Clostridium difficile, not specified as recurrent: Secondary | ICD-10-CM | POA: Diagnosis not present

## 2020-03-17 DIAGNOSIS — J449 Chronic obstructive pulmonary disease, unspecified: Secondary | ICD-10-CM | POA: Diagnosis not present

## 2020-03-17 DIAGNOSIS — F329 Major depressive disorder, single episode, unspecified: Secondary | ICD-10-CM | POA: Diagnosis not present

## 2020-03-17 DIAGNOSIS — I2699 Other pulmonary embolism without acute cor pulmonale: Secondary | ICD-10-CM | POA: Diagnosis not present

## 2020-03-17 DIAGNOSIS — F17210 Nicotine dependence, cigarettes, uncomplicated: Secondary | ICD-10-CM | POA: Diagnosis not present

## 2020-03-17 DIAGNOSIS — F410 Panic disorder [episodic paroxysmal anxiety] without agoraphobia: Secondary | ICD-10-CM | POA: Diagnosis not present

## 2020-03-17 DIAGNOSIS — I110 Hypertensive heart disease with heart failure: Secondary | ICD-10-CM | POA: Diagnosis not present

## 2020-03-17 DIAGNOSIS — I5033 Acute on chronic diastolic (congestive) heart failure: Secondary | ICD-10-CM | POA: Diagnosis not present

## 2020-03-19 ENCOUNTER — Telehealth: Payer: Self-pay | Admitting: *Deleted

## 2020-03-19 ENCOUNTER — Telehealth: Payer: Self-pay

## 2020-03-19 NOTE — Telephone Encounter (Signed)
Patient called stating she is out of pain meds.

## 2020-03-19 NOTE — Telephone Encounter (Signed)
Notified. 

## 2020-03-19 NOTE — Telephone Encounter (Signed)
Patient left a message. Patient states she is a recent hospital d/c. She states that she was only dispensed a 7 day supply of  Her pain meds and is asking for refills on her oxycodone and tramadol.

## 2020-03-19 NOTE — Telephone Encounter (Signed)
Notified to call her pain managemtn MD to continue this (see previous message).

## 2020-03-19 NOTE — Telephone Encounter (Signed)
She has a pain management doctor- she needs to see them/him- we explained to her she would need to go back to her pain management doctor- we added tramadol and I was clear they might not continue that medication- but she will have to ask.  ML

## 2020-03-21 DIAGNOSIS — A0472 Enterocolitis due to Clostridium difficile, not specified as recurrent: Secondary | ICD-10-CM | POA: Diagnosis not present

## 2020-03-22 ENCOUNTER — Other Ambulatory Visit: Payer: Self-pay

## 2020-03-22 ENCOUNTER — Inpatient Hospital Stay: Payer: PPO | Admitting: Adult Health

## 2020-03-22 ENCOUNTER — Encounter: Payer: Self-pay | Admitting: Pulmonary Disease

## 2020-03-22 ENCOUNTER — Ambulatory Visit (INDEPENDENT_AMBULATORY_CARE_PROVIDER_SITE_OTHER): Payer: PPO | Admitting: Pulmonary Disease

## 2020-03-22 DIAGNOSIS — Z87891 Personal history of nicotine dependence: Secondary | ICD-10-CM | POA: Insufficient documentation

## 2020-03-22 DIAGNOSIS — J9601 Acute respiratory failure with hypoxia: Secondary | ICD-10-CM

## 2020-03-22 DIAGNOSIS — I2694 Multiple subsegmental pulmonary emboli without acute cor pulmonale: Secondary | ICD-10-CM

## 2020-03-22 DIAGNOSIS — J690 Pneumonitis due to inhalation of food and vomit: Secondary | ICD-10-CM

## 2020-03-22 DIAGNOSIS — R112 Nausea with vomiting, unspecified: Secondary | ICD-10-CM

## 2020-03-22 DIAGNOSIS — Z0000 Encounter for general adult medical examination without abnormal findings: Secondary | ICD-10-CM

## 2020-03-22 DIAGNOSIS — J9621 Acute and chronic respiratory failure with hypoxia: Secondary | ICD-10-CM

## 2020-03-22 NOTE — Assessment & Plan Note (Signed)
No immunization history on file At next office visit need to check to see if patient has received the pneumonia vaccines, seasonal flu vaccines as well as the COVID-19 vaccine  Plan: We will also route note to primary care to see if they can coordinate

## 2020-03-22 NOTE — Progress Notes (Addendum)
Virtual Visit via Telephone Note  I connected with Dawn Beasley on 03/22/20 at  3:30 PM EDT by telephone and verified that I am speaking with the correct person using two identifiers.  Location: Patient: Home Provider: [facility] - [address], [place], [place], [place] [address]   I discussed the limitations, risks, security and privacy concerns of performing an evaluation and management service by telephone and the availability of in person appointments. I also discussed with the patient that there may be a patient responsible charge related to this service. The patient expressed understanding and agreed to proceed.  Patient consented to consult via telephone: Yes People present and their role in pt care: Pt   History of Present Illness:  68 year old former smoker   PMH: Chronic pain, benzodiazepine withdrawal, hypertension, sepsis, rhabdo, delirium, history of PE, debility, metabolic encephalopathy smoking History: Former Smoker. Quit 2021.  40 pack year.  Maintenance: None Patient of Dr. Valeta Harms  Chief complaint:    69 year old female former smoker initially consulted with our practice when inpatient.  Patient was admitted on 02/11/2020.  She was consulted with our team on 02/11/2020.  She required intubation.  Was extubated on 02/13/2020.  Was discharged as well as readmitted to the ICU due to acute on chronic respiratory failure.  She was treated for acute hypoxemic respiratory failure that was multifactorial from aspiration pneumonia which was positive for Serratia, post EGD, right lower pulmonary embolism, pulmonary edema due to COPD as well as benzodiazepine withdrawal and agitation delirium.  She was recommended to have dysphagia 2 diet per swallow study at bedside.  She had acute metabolic encephalopathy felt to be related to benzodiazepine withdrawal.  Pulmonary embolism right lower lobe segment.  Repeated TEE on 02/22/2020 showed improvement now normal RV function,  patient discharged to home DOAC.  Patient encouraged to follow-up with gastroenterology for chronic emesis as well as GERD.  Patient was discharged on 03/13/2020 from [facility] inpatient rehab.  She was recommended to follow-up with urology due to continued urinary retention and Foley catheter encouraged to remain in place.  Reach patient telephonically at home to complete a hospital follow-up.  She reports that she has been doing well since getting discharged on 03/13/2020.  She has been working with physical therapy and is starting to work with occupational therapy today.  She reports that nobody contacted her from urology but she was able to schedule an appointment on her own with [facility] on 03/27/2020 for Foley removal.  Her Foley catheter still in place.  She has a televisit scheduled with her primary care provider tomorrow.  She has no follow-up scheduled with gastroenterology but she is established with Dr. Fuller Plan.  We will discuss this today.  Patient continues to not smoke.  Patient is maintained on Eliquis.  She reports that this was the first VTE event that she is ever had with her right pulmonary embolism.  She denies hemoptysis.  Patient denies any fevers, cough, congestion or respiratory complaints today.  Smoking assessment and cessation counseling  Patient currently smoking: Stop smoking in March/2021 I have advised the patient to quit/stop smoking as soon as possible due to high risk for multiple medical problems.  It will also be very difficult for [place] to manage patient's  respiratory symptoms and status if we continue to expose her lungs to a known irritant.  We do not advise e-cigarettes as a form of stopping smoking.  Patient has successfully stopped smoking as  of March/2021 with her hospitalization.  We have congratulated her.  I explained that guideline recommendations are a referral to a lung cancer screening program which would start in March/2022.  Patient declined.  Patient  reports "now that I have stop smoking my lungs are healing".  I explained to patient that given her pack-year history still need to follow her appropriately to ensure she is not developing abnormal pulmonary nodules.  She is high risk for developing lung cancer.  She still declines referral to lung cancer screening today.  I have advised the patient that we can assist and have options of nicotine replacement therapy, provided smoking cessation education today, provided smoking cessation counseling, and provided cessation resources.  Follow-up next office visit office visit for assessment of smoking cessation.  Smoking cessation counseling advised for: 3 min   Observations/Objective:  3/27 chest xray with right middle and lower lobe airspace opacities concerning for aspiration CTA chest 3/31 > acute pulmonary embolus within segmental branch of the right lower lobe. Interval development of bilateral pleural effusions L > R. Dense opacification bilaterally.  CT head 3/31 > No evidence of acute intracranial abnormality. Stable chronic small vessel ischemic disease. 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 Echo 3/29 > LVEF 65-70 %. Mild reduced RVSF. ? Of McConnells sign.  CXR 4/5 > Increased L pleural effusion and basilar airspace disease. Mild improvement in diffuse airspace disease throughout the right chest. Small right effusion is unchanged. Cardiomegaly.Emphysema. 4/7 TTE >> EF 65-70%, hyperdynamic, no RWA, G1DD, RVSF normal, moderate left pleural effusion- compared to 3/29 study, RV appears to be improved, less dilated and more contractile.   Social History   Tobacco Use  Smoking Status Former Smoker  . Packs/day: 1.00  . Years: 40.00  . Pack years: 40.00  . Quit date: 02/11/2020  . Years since quitting: 0.1  Smokeless Tobacco Never Used    There is no immunization history on file for this patient.  Assessment and Plan:  Multiple subsegmental  pulmonary emboli without acute cor pulmonale (HCC) Recent pulmonary embolism in March/2021 Maintained on Eliquis Patient denies any hemoptysis This is the first VTE event of the patient  Plan: Continue Eliquis 1-month follow-up in our office  Acute on chronic respiratory failure with hypoxia (HCC) Per patient this appears resolved  Aspiration pneumonia (HCC) Recent aspiration pneumonia with sputum culture growing Serratia Treated with antibiotics Status post 1 month hospital admission including rehab Patient was recommended to follow a dysphagia 2 diet Patient reports that she is currently outpatient and not following a specific diet but is chewing small bites and swallowing slowly and eating slowly Patient denies cough, congestion, fevers  Plan: I have encouraged patient to schedule a follow-up with gastroenterology, she reports she will do so I have encouraged patient to keep follow-up with primary care tomorrow which is a telephonic visit Patient to contact our office if she is having worsened fevers, cough with congestion Need to consider repeat chest x-ray at next in-person office visit  Respiratory failure [facility]) Resolved  Discussion: Discussed the patient with her smoking history as well as recent hospitalization it would be beneficial to obtain pulmonary function testing.  Patient declines.  She is not interested in taking a breathing test at this time she will discuss this with her husband  Plan: We offered a pulmonary function test, patient declined 51-month follow-up with Dr. Tonia Brooms to establish care  Nausea and vomiting Establish with gastroenterology Dr. Russella Dar History of chronic emesis  Plan: Encourage patient to schedule follow-up with gastroenterology as she is post hospital discharge  Former smoker 40-pack-year smoking history Quit in March/2021 with hospitalization Patient reports that she continues to not smoke  Plan: Requested pulmonary function  testing, patient declined she will discuss this with her husband and she does not feel that is necessary Offered referral to lung cancer screening, patient declined Counseled patient that without pulmonary function testing as well as appropriate guideline recommendations for treatment given her smoking history it will be difficult to fully manage her breathing appropriately, patient reports that she understands the risks she still declines the referral to lung cancer screening as well as pulmonary function testing at this time 72-month follow-up with Dr. Tonia Brooms  Healthcare maintenance No immunization history on file At next office visit need to check to see if patient has received the pneumonia vaccines, seasonal flu vaccines as well as the COVID-19 vaccine  Plan: We will also route note to primary care to see if they can coordinate   Follow Up Instructions:  Return in about 2 months (around 05/22/2020), or if symptoms worsen or fail to improve, for Follow up with Dr. Tonia Brooms, 30 MINUTE SLOT.   I discussed the assessment and treatment plan with the patient. The patient was provided an opportunity to ask questions and all were answered. The patient agreed with the plan and demonstrated an understanding of the instructions.   The patient was advised to call back or seek an in-person evaluation if the symptoms worsen or if the condition fails to improve as anticipated.  I provided 35 minutes of non-face-to-face time during this encounter.   Coral Ceo, NP

## 2020-03-22 NOTE — Patient Instructions (Addendum)
You were seen today by Coral Ceo, NP  for:   1. Acute respiratory failure with hypoxia (HCC) 2. Aspiration pneumonia of right lower lobe due to vomit (HCC)  Glad you are recovering from your hospitalization  Continue to work with physical therapy and Occupational Therapy  Notify our office if you are having worsened fevers, cough, congestion  We recommended a pulmonary function test today, you declined   3. Multiple subsegmental pulmonary emboli without acute cor pulmonale (HCC)  Continue Eliquis  79-month follow-up with our office  4. Acute on chronic respiratory failure with hypoxia (HCC)  Glad that you are feeling better  Continue to work with physical therapy and Occupational Therapy  5. Nausea and vomiting, intractability of vomiting not specified, unspecified vomiting type  As discussed today please schedule a follow-up with gastroenterology  6. Former smoker  Continue to not smoke  It is our recommendations that you obtain pulmonary function testing, you declined this today  Is also a recommendation that we refer you to the lung cancer screening program for a low-dose CT in March/2022, you declined this today >>>This is based off of your 40 pack-year smoking history >>> This is a recommendation from the [place] preventative services task force (USPSTF) >>>The USPSTF recommends annual screening for lung cancer with low-dose computed tomography (LDCT) in adults aged 59 to 80 years who have a 20 pack-year smoking history and currently smoke or have quit within the past 15 years. Screening should be discontinued once a person has not smoked for 15 years or develops a health problem that substantially limits life expectancy or the ability or willingness to have curative lung surgery.     7. Healthcare maintenance  Please check with primary care to see if you receive the pneumonia vaccine before  We would recommend the COVID-19 vaccine  It is recommended that you  receive the seasonal flu vaccine   Follow Up:    Return in about 2 months (around 05/22/2020), or if symptoms worsen or fail to improve, for Follow up with Dr. Tonia Brooms, 30 MINUTE SLOT.   Please do your part to reduce the spread of COVID-19:      Reduce your risk of any infection  and COVID19 by using the similar precautions used for avoiding the common cold or flu:  Marland Kitchen Wash your hands often with soap and warm water for at least 20 seconds.  If soap and water are not readily available, use an alcohol-based hand sanitizer with at least 60% alcohol.  . If coughing or sneezing, cover your mouth and nose by coughing or sneezing into the elbow areas of your shirt or coat, into a tissue or into your sleeve (not your hands). Drinda Butts A MASK when in public  . Avoid shaking hands with others and consider head nods or verbal greetings only. . Avoid touching your eyes, nose, or mouth with unwashed hands.  . Avoid close contact with people who are sick. . Avoid places or events with large numbers of people in one location, like concerts or sporting events. . If you have some symptoms but not all symptoms, continue to monitor at home and seek medical attention if your symptoms worsen. . If you are having a medical emergency, call 911.   ADDITIONAL HEALTHCARE OPTIONS FOR PATIENTS  [facility] / e-Visit: [email or web address]         [facility]: [phone]  [facility]: [phone]  [facility]: [phone]     It is flu season:   >>> Best ways to protect herself from the flu: Receive the yearly flu vaccine, practice good hand hygiene washing with soap and also using hand sanitizer when available, eat a nutritious meals, get adequate rest, hydrate appropriately   Please contact the office if your symptoms worsen or you have concerns that you are not improving.   Thank you for choosing  [facility] for your healthcare, and for allowing [place] to partner with you on your healthcare journey. I am thankful to be able to provide care to you today.   Wyn Quaker FNP-C

## 2020-03-22 NOTE — Assessment & Plan Note (Signed)
Establish with gastroenterology Dr. Russella Dar History of chronic emesis  Plan: Encourage patient to schedule follow-up with gastroenterology as she is post hospital discharge

## 2020-03-22 NOTE — Assessment & Plan Note (Signed)
Recent aspiration pneumonia with sputum culture growing Serratia Treated with antibiotics Status post 1 month hospital admission including rehab Patient was recommended to follow a dysphagia 2 diet Patient reports that she is currently outpatient and not following a specific diet but is chewing small bites and swallowing slowly and eating slowly Patient denies cough, congestion, fevers  Plan: I have encouraged patient to schedule a follow-up with gastroenterology, she reports she will do so I have encouraged patient to keep follow-up with primary care tomorrow which is a telephonic visit Patient to contact our office if she is having worsened fevers, cough with congestion Need to consider repeat chest x-ray at next in-person office visit

## 2020-03-22 NOTE — Assessment & Plan Note (Signed)
Resolved  Discussion: Discussed the patient with her smoking history as well as recent hospitalization it would be beneficial to obtain pulmonary function testing.  Patient declines.  She is not interested in taking a breathing test at this time she will discuss this with her husband  Plan: We offered a pulmonary function test, patient declined 69-month follow-up with Dr. Tonia Brooms to establish care

## 2020-03-22 NOTE — Assessment & Plan Note (Signed)
Recent pulmonary embolism in March/2021 Maintained on Eliquis Patient denies any hemoptysis This is the first VTE event of the patient  Plan: Continue Eliquis 6-month follow-up in our office

## 2020-03-22 NOTE — Assessment & Plan Note (Signed)
Per patient this appears resolved

## 2020-03-22 NOTE — Assessment & Plan Note (Signed)
40-pack-year smoking history Quit in March/2021 with hospitalization Patient reports that she continues to not smoke  Plan: Requested pulmonary function testing, patient declined she will discuss this with her husband and she does not feel that is necessary Offered referral to lung cancer screening, patient declined Counseled patient that without pulmonary function testing as well as appropriate guideline recommendations for treatment given her smoking history it will be difficult to fully manage her breathing appropriately, patient reports that she understands the risks she still declines the referral to lung cancer screening as well as pulmonary function testing at this time 86-month follow-up with Dr. Tonia Brooms

## 2020-03-23 ENCOUNTER — Telehealth: Payer: Self-pay

## 2020-03-23 DIAGNOSIS — I2699 Other pulmonary embolism without acute cor pulmonale: Secondary | ICD-10-CM | POA: Diagnosis not present

## 2020-03-23 DIAGNOSIS — G9341 Metabolic encephalopathy: Secondary | ICD-10-CM | POA: Diagnosis not present

## 2020-03-23 DIAGNOSIS — R5381 Other malaise: Secondary | ICD-10-CM | POA: Diagnosis not present

## 2020-03-23 DIAGNOSIS — Z79899 Other long term (current) drug therapy: Secondary | ICD-10-CM | POA: Diagnosis not present

## 2020-03-23 DIAGNOSIS — J690 Pneumonitis due to inhalation of food and vomit: Secondary | ICD-10-CM | POA: Diagnosis not present

## 2020-03-23 DIAGNOSIS — A0472 Enterocolitis due to Clostridium difficile, not specified as recurrent: Secondary | ICD-10-CM | POA: Diagnosis not present

## 2020-03-23 DIAGNOSIS — R339 Retention of urine, unspecified: Secondary | ICD-10-CM | POA: Diagnosis not present

## 2020-03-23 NOTE — Telephone Encounter (Signed)
Dawn Beasley, OT from [facility] called requesting HHOT 1wk3. Orders approved and given per discharge summary.

## 2020-03-26 NOTE — Progress Notes (Signed)
PCCM: thanks for seeing. Sounds like a lot going on  Dawn Igo, DO [facility] 03/26/2020 5:05 PM

## 2020-03-27 ENCOUNTER — Encounter: Payer: Self-pay | Admitting: Pulmonary Disease

## 2020-03-27 DIAGNOSIS — G894 Chronic pain syndrome: Secondary | ICD-10-CM | POA: Diagnosis not present

## 2020-03-27 DIAGNOSIS — R338 Other retention of urine: Secondary | ICD-10-CM | POA: Diagnosis not present

## 2020-03-27 DIAGNOSIS — Z79891 Long term (current) use of opiate analgesic: Secondary | ICD-10-CM | POA: Diagnosis not present

## 2020-04-02 DIAGNOSIS — Z681 Body mass index (BMI) 19 or less, adult: Secondary | ICD-10-CM | POA: Diagnosis not present

## 2020-04-02 DIAGNOSIS — M961 Postlaminectomy syndrome, not elsewhere classified: Secondary | ICD-10-CM

## 2020-04-02 DIAGNOSIS — F329 Major depressive disorder, single episode, unspecified: Secondary | ICD-10-CM

## 2020-04-02 DIAGNOSIS — E7800 Pure hypercholesterolemia, unspecified: Secondary | ICD-10-CM | POA: Diagnosis not present

## 2020-04-02 DIAGNOSIS — M549 Dorsalgia, unspecified: Secondary | ICD-10-CM | POA: Diagnosis not present

## 2020-04-02 DIAGNOSIS — G9341 Metabolic encephalopathy: Secondary | ICD-10-CM

## 2020-04-02 DIAGNOSIS — I5033 Acute on chronic diastolic (congestive) heart failure: Secondary | ICD-10-CM

## 2020-04-02 DIAGNOSIS — Z9682 Presence of neurostimulator: Secondary | ICD-10-CM

## 2020-04-02 DIAGNOSIS — F1010 Alcohol abuse, uncomplicated: Secondary | ICD-10-CM

## 2020-04-02 DIAGNOSIS — F410 Panic disorder [episodic paroxysmal anxiety] without agoraphobia: Secondary | ICD-10-CM | POA: Diagnosis not present

## 2020-04-02 DIAGNOSIS — F17210 Nicotine dependence, cigarettes, uncomplicated: Secondary | ICD-10-CM

## 2020-04-02 DIAGNOSIS — Z79899 Other long term (current) drug therapy: Secondary | ICD-10-CM | POA: Diagnosis not present

## 2020-04-02 DIAGNOSIS — I110 Hypertensive heart disease with heart failure: Secondary | ICD-10-CM

## 2020-04-02 DIAGNOSIS — Z7901 Long term (current) use of anticoagulants: Secondary | ICD-10-CM

## 2020-04-02 DIAGNOSIS — I714 Abdominal aortic aneurysm, without rupture: Secondary | ICD-10-CM

## 2020-04-02 DIAGNOSIS — M48061 Spinal stenosis, lumbar region without neurogenic claudication: Secondary | ICD-10-CM | POA: Diagnosis not present

## 2020-04-02 DIAGNOSIS — J449 Chronic obstructive pulmonary disease, unspecified: Secondary | ICD-10-CM

## 2020-04-02 DIAGNOSIS — R339 Retention of urine, unspecified: Secondary | ICD-10-CM

## 2020-04-02 DIAGNOSIS — Z9181 History of falling: Secondary | ICD-10-CM

## 2020-04-02 DIAGNOSIS — I10 Essential (primary) hypertension: Secondary | ICD-10-CM | POA: Diagnosis not present

## 2020-04-02 DIAGNOSIS — G8929 Other chronic pain: Secondary | ICD-10-CM | POA: Diagnosis not present

## 2020-04-02 DIAGNOSIS — R159 Full incontinence of feces: Secondary | ICD-10-CM

## 2020-04-02 DIAGNOSIS — F418 Other specified anxiety disorders: Secondary | ICD-10-CM | POA: Diagnosis not present

## 2020-04-02 DIAGNOSIS — A0472 Enterocolitis due to Clostridium difficile, not specified as recurrent: Secondary | ICD-10-CM

## 2020-04-02 DIAGNOSIS — K219 Gastro-esophageal reflux disease without esophagitis: Secondary | ICD-10-CM

## 2020-04-02 DIAGNOSIS — I2699 Other pulmonary embolism without acute cor pulmonale: Secondary | ICD-10-CM

## 2020-04-03 ENCOUNTER — Telehealth: Payer: Self-pay | Admitting: *Deleted

## 2020-04-03 DIAGNOSIS — A0472 Enterocolitis due to Clostridium difficile, not specified as recurrent: Secondary | ICD-10-CM | POA: Diagnosis not present

## 2020-04-03 NOTE — Telephone Encounter (Signed)
Dawn Beasley called to request tp cancel therapy PT/OT feels she does not need anymore. She also is asking if she can just fill out the papers she received in the mail and not come in (hosp fu 04/11/20). Please advise.

## 2020-04-03 NOTE — Telephone Encounter (Signed)
She doesn't have to come in, however if so, unable to do any orders for her now or in future if she cancels- but it's up to her.  She has some cognitive issues, but if husband OK with her cancelling appointment- that's fine- I wouldn't normally say that however she wasn't fully able to make decisions while in CIR.

## 2020-04-05 NOTE — Telephone Encounter (Signed)
Notified Mrs Bilski.  She would like to cancel but she says she appreciates everything Dr Berline Chough did for her, She has since "come down" with c diff and is on a 6 week regimen. She has notified [facility] she wanted therapies canceled.

## 2020-04-06 ENCOUNTER — Encounter: Payer: Self-pay | Admitting: Gastroenterology

## 2020-04-11 ENCOUNTER — Encounter: Payer: PPO | Admitting: Physical Medicine and Rehabilitation

## 2020-04-17 DIAGNOSIS — F418 Other specified anxiety disorders: Secondary | ICD-10-CM | POA: Diagnosis not present

## 2020-04-17 DIAGNOSIS — Z681 Body mass index (BMI) 19 or less, adult: Secondary | ICD-10-CM | POA: Diagnosis not present

## 2020-04-17 DIAGNOSIS — A0472 Enterocolitis due to Clostridium difficile, not specified as recurrent: Secondary | ICD-10-CM | POA: Diagnosis not present

## 2020-04-17 DIAGNOSIS — G4700 Insomnia, unspecified: Secondary | ICD-10-CM | POA: Diagnosis not present

## 2020-04-17 DIAGNOSIS — R109 Unspecified abdominal pain: Secondary | ICD-10-CM | POA: Diagnosis not present

## 2020-04-25 ENCOUNTER — Telehealth: Payer: Self-pay | Admitting: *Deleted

## 2020-04-25 ENCOUNTER — Ambulatory Visit: Payer: PPO | Admitting: Internal Medicine

## 2020-04-25 ENCOUNTER — Other Ambulatory Visit: Payer: Self-pay

## 2020-04-25 ENCOUNTER — Encounter: Payer: Self-pay | Admitting: Internal Medicine

## 2020-04-25 VITALS — BP 121/78 | HR 83 | Temp 98.2°F | Wt 105.6 lb

## 2020-04-25 DIAGNOSIS — R634 Abnormal weight loss: Secondary | ICD-10-CM

## 2020-04-25 DIAGNOSIS — R630 Anorexia: Secondary | ICD-10-CM | POA: Diagnosis not present

## 2020-04-25 DIAGNOSIS — A0471 Enterocolitis due to Clostridium difficile, recurrent: Secondary | ICD-10-CM

## 2020-04-25 MED ORDER — MIRTAZAPINE 15 MG PO TABS: 15.0000 mg | ORAL_TABLET | Freq: Every day | ORAL | 30 refills | Status: DC

## 2020-04-25 NOTE — Progress Notes (Signed)
RFV: new patient for cdifficile  Patient ID: Dawn Beasley, female   DOB: Dec 26, 1950, 70 y.o.   MRN: [id number]  HPI She was hospitalized in aspiration pneumonia from vomiting and also found to have PE  - was in the ICU for 2 1/2 weeks, then developed diarrhea, watery several bowel movements where she was treated for cdifficile colitic treated with oral vanco125mg  QID x 10 days April 27th, 2021, had some diarrhea but then repeat testing on 5/7 was negative.  Repeated testing because fever, chills, diarrhea, with 5/17 being +, she was retreated in mid may - did 9 days of oral vanco 125mg  QID but stopped early due to side effects, of ringing in her ears, dry mouth. Tried to finish out treatment course of metronidazole but intolerant  Now having normal stools, having 1 BM a day- however last took medicine 6/3. BM normal since 5/19.  Has lost #30 lb since being hospitalized  Outpatient Encounter Medications as of 04/25/2020  Medication Sig  . acetaminophen (TYLENOL) 325 MG tablet Take 2 tablets (650 mg total) by mouth every 6 (six) hours as needed for mild pain (or Fever >/= 101).  Marland Kitchen ALPRAZolam (XANAX) 0.5 MG tablet Take 1 tablet (0.5 mg total) by mouth 2 (two) times daily as needed for anxiety.  Marland Kitchen apixaban (ELIQUIS) 5 MG TABS tablet Take 1 tablet (5 mg total) by mouth 2 (two) times daily.  . furosemide (LASIX) 20 MG tablet Take 1 tablet (20 mg total) by mouth daily.  . hydrALAZINE (APRESOLINE) 25 MG tablet Take 1 tablet (25 mg total) by mouth every 8 (eight) hours.  . nicotine (NICODERM CQ - DOSED IN MG/24 HOURS) 21 mg/24hr patch 21 mg patch daily x1 week then 14 mg patch daily x3 weeks then 7 mg patch daily x3 weeks and stop  . oxyCODONE (OXY IR/ROXICODONE) 5 MG immediate release tablet Take 1 tablet (5 mg total) by mouth every 6 (six) hours as needed for moderate pain.  . traZODone (DESYREL) 50 MG tablet Take 1 tablet (50 mg total) by mouth at bedtime.  . folic acid (FOLVITE) 1 MG tablet Take 1  tablet (1 mg total) by mouth daily. (Patient not taking: Reported on 04/25/2020)  . losartan (COZAAR) 100 MG tablet Take 100 mg by mouth daily.  . magnesium oxide (MAG-OX) 400 (241.3 Mg) MG tablet Take 1 tablet (400 mg total) by mouth 2 (two) times daily. (Patient not taking: Reported on 04/25/2020)  . Multiple Vitamin (MULTIVITAMIN WITH MINERALS) TABS tablet Take 1 tablet by mouth daily. (Patient not taking: Reported on 04/25/2020)  . pantoprazole (PROTONIX) 40 MG tablet Take 1 tablet (40 mg total) by mouth 2 (two) times daily. (Patient not taking: Reported on 04/25/2020)  . polyethylene glycol (MIRALAX / GLYCOLAX) 17 g packet Take 17 g by mouth daily. (Patient not taking: Reported on 04/25/2020)  . traMADol (ULTRAM) 50 MG tablet Take 1 tablet (50 mg total) by mouth every 6 (six) hours. (Patient not taking: Reported on 04/25/2020)  . vancomycin (VANCOCIN) 125 MG capsule Take 125 mg by mouth 4 (four) times daily.   No facility-administered encounter medications on file as of 04/25/2020.     Patient Active Problem List   Diagnosis Date Noted  . Former smoker 03/22/2020  . Healthcare maintenance 03/22/2020  . Sacral decubitus ulcer, stage II ([place]) 03/02/2020  . Acute metabolic encephalopathy 09/13/2535  . Debility 03/02/2020  . Multiple subsegmental pulmonary emboli without acute cor pulmonale ([place])   . Delirium   .  Pancytopenia (HCC) 02/12/2020  . Sepsis (HCC) 02/12/2020  . Aspiration pneumonia (HCC) 02/12/2020  . Acute on chronic respiratory failure with hypoxia (HCC) 02/12/2020  . Hyponatremia 02/12/2020  . AKI (acute kidney injury) (HCC) 02/12/2020  . Dehydration 02/12/2020  . Rhabdomyolysis 02/12/2020  . Elevated troponin 02/12/2020  . Respiratory failure (HCC) 02/12/2020  . Abnormal CT scan, colon 02/06/2020  . Nausea and vomiting 02/06/2020  . Essential hypertension   . 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 Encephalopathy acute 12/23/2016  . Transient alteration of awareness 12/23/2016  .  Acute hyponatremia   . Speech disturbance   . Benzodiazepine withdrawal with complication (HCC)   . Other chronic pain 12/06/2015  . Lumbar spinal stenosis 04/12/2014     Health Maintenance Due  Topic Date Due  . Hepatitis C Screening  Never done  . COVID-19 Vaccine (1) Never done  . TETANUS/TDAP  Never done  . MAMMOGRAM  Never done  . DEXA SCAN  Never done  . PNA vac Low Risk Adult (1 of 2 - PCV13) Never done     Review of Systems  Constitutional: + decreased appetite. Negative for fever, chills, diaphoresis, activity change, fatigue and unexpected weight change.  HENT: Negative for congestion, sore throat, rhinorrhea, sneezing, trouble swallowing and sinus pressure.  Eyes: Negative for photophobia and visual disturbance.  Respiratory: Negative for cough, chest tightness, shortness of breath, wheezing and stridor.  Cardiovascular: Negative for chest pain, palpitations and leg swelling.  Gastrointestinal: Negative for nausea, vomiting, abdominal pain, diarrhea, constipation, blood in stool, abdominal distention and anal bleeding.  Genitourinary: Negative for dysuria, hematuria, flank pain and difficulty urinating.  Musculoskeletal: Negative for myalgias, back pain, joint swelling, arthralgias and gait problem.  Skin: Negative for color change, pallor, rash and wound.  Neurological: Negative for dizziness, tremors, weakness and light-headedness.  Hematological: Negative for adenopathy. Does not bruise/bleed easily.  Psychiatric/Behavioral: +insomnia. Negative for behavioral problems, confusion, sleep disturbance, dysphoric mood, decreased concentration and agitation.    Physical Exam   BP 121/78   Pulse 83   Temp 98.2 F (36.8 C)   Wt 105 lb 9.6 oz (47.9 kg)   SpO2 92%   BMI 19.31 kg/m   Physical Exam  Constitutional:  oriented to person, place, and time. appears well-developed and well-nourished. No distress.  HENT: [place]/AT, PERRLA, no scleral icterus Mouth/Throat:  Oropharynx is clear and moist. No oropharyngeal exudate.  Cardiovascular: Normal rate, regular rhythm and normal heart sounds. Exam reveals no gallop and no friction rub.  No murmur heard.  Pulmonary/Chest: Effort normal and breath sounds normal. No respiratory distress.  has no wheezes.  Neck = supple, no nuchal rigidity Abdominal: Soft. Bowel sounds are normal.  exhibits no distension. There is no tenderness.  Lymphadenopathy: no cervical adenopathy. No axillary adenopathy Neurological: alert and oriented to person, place, and time.  Skin: Skin is warm and dry. No rash noted. No erythema.  Psychiatric: a normal mood and affect.  behavior is normal.   CBC Lab Results  Component Value Date   WBC 10.9 (H) 03/08/2020   RBC 2.34 (L) 03/08/2020   HGB 8.8 (L) 03/08/2020   HCT 26.6 (L) 03/08/2020   PLT 449 (H) 03/08/2020   MCV 113.7 (H) 03/08/2020   MCH 37.6 (H) 03/08/2020   MCHC 33.1 03/08/2020   RDW 13.6 03/08/2020   LYMPHSABS 0.7 03/08/2020   MONOABS 0.8 03/08/2020   EOSABS 0.1 03/08/2020    BMET Lab Results  Component Value Date   NA 133 (L)  03/08/2020   K 4.1 03/08/2020   CL 99 03/08/2020   CO2 25 03/08/2020   GLUCOSE 108 (H) 03/08/2020   BUN 6 (L) 03/08/2020   CREATININE 0.51 03/08/2020   CALCIUM 8.8 (L) 03/08/2020   GFRNONAA >60 03/08/2020   GFRAA >60 03/08/2020    Assessment and Plan  Recurrent c.difficile = discuss that we can use dificid at the next episode if she has one. Can discuss use of zimplava vs refer [facility] for fecal [facility] if not accessible through [place].  Weight loss = eating ensure drink to help with getting back some of her baseline weight.  Loss of appetite = feels weak from weight/muscle loss. Will try a short course of mirtazipine to see if she notice any improvement

## 2020-04-25 NOTE — Telephone Encounter (Signed)
Left message for patient asking her 1) to schedule 4 week follow up visit with Dr Baxter Flattery and 2) let her know we have a stool kit for her to pick up at the front desk as she needs it.   Patient left clinic before receiving her check out sheet and follow up information. Landis Gandy, RN

## 2020-04-30 ENCOUNTER — Telehealth: Payer: Self-pay | Admitting: *Deleted

## 2020-04-30 NOTE — Telephone Encounter (Signed)
Patient called, stated the mirtazapine isn't working yet for sleep or appetite.  She was up at 2am.  She still does not have an appetite, but felt better after eating a peanut butter and jelly sandwich before bedtime. RN advised this will take 1-2 weeks to begin to work, encouraged patient to eat small meals or snacks through out the day, and to incorporate good bedtime habits. Patient agreed. Andree Coss, RN

## 2020-05-07 DIAGNOSIS — A0472 Enterocolitis due to Clostridium difficile, not specified as recurrent: Secondary | ICD-10-CM | POA: Diagnosis not present

## 2020-05-09 ENCOUNTER — Telehealth: Payer: Self-pay

## 2020-05-09 NOTE — Telephone Encounter (Signed)
Patient called to report that sample she provided to lab was positive for C.Diff. Requested provider reach out to discuss next steps.  Ellana Kawa Loyola Mast, RN

## 2020-05-10 NOTE — Telephone Encounter (Signed)
Patient is calling back today for information related to her recent positive C. Diff testing.   She would also like to report the remeron is not helping with sleeping , she is not able to stay asleep and not able to go back to sleep she has been taking this medication since April 25, 2020 and does not feel it is working.    Please advise.   Laurell Josephs, RN

## 2020-05-11 ENCOUNTER — Other Ambulatory Visit: Payer: Self-pay | Admitting: Internal Medicine

## 2020-05-11 MED ORDER — FIDAXOMICIN 200 MG PO TABS: 200.0000 mg | ORAL_TABLET | Freq: Two times a day (BID) | ORAL | 0 refills | Status: DC

## 2020-05-11 NOTE — Progress Notes (Signed)
Sent in rx for fidaxomicin. Asked her to send copy of her test results to [place]. In the meantime, we will see if can do zimplava infusion

## 2020-05-11 NOTE — Telephone Encounter (Signed)
Patient calling back requesting information about positive c.diff results. Expressed frustration that she hasn't heard back and would like medication or advise by the end of the day today. Explained that staff has reached out to Dr. Drue Second and we're currently awaiting response. Forwarding to provider.  Raechel Marcos Loyola Mast, RN

## 2020-05-11 NOTE — Telephone Encounter (Signed)
I called her to get her started on fidaxomicin

## 2020-05-14 ENCOUNTER — Telehealth: Payer: Self-pay

## 2020-05-14 ENCOUNTER — Telehealth: Payer: Self-pay | Admitting: Pharmacy Technician

## 2020-05-14 NOTE — Telephone Encounter (Signed)
[facility] Patient Advocate Encounter   Received notification from [facility] [employer] that prior authorization for Zinplava is required.   PA submitted on 05/14/2020 Key [id number] Status is pending    [facility] will continue to follow.   [name]. [name] CPhT Specialty Pharmacy Patient [facility] for Infectious Disease Phone: [phone] Fax:  [phone]

## 2020-05-14 NOTE — Telephone Encounter (Signed)
Thanks for your assistance.

## 2020-05-14 NOTE — Telephone Encounter (Signed)
Patient states her new medication Dificid was too expensive and her copay was $1067. [facility] will look into patient assistance to help with co pays if patient meets income criteria.  Patient states if needed she will restart vancomycin tablets.  Kathie Rhodes will reach out to patient regarding assistance.  Valarie Cones

## 2020-05-14 NOTE — Telephone Encounter (Signed)
[facility] Patient Advocate Encounter  Completed and sent [employer] application for Dificid for this patient needing copay assistance.    Patient is approved through 11/16/2020.  Medication will be shipped from their pharmacy directly to the patient within 3-5 business days.  Patient is aware.  [name]. [name] CPhT Specialty Pharmacy Patient [facility] for Infectious Disease Phone: [phone] Fax:  [phone]

## 2020-05-17 ENCOUNTER — Other Ambulatory Visit: Payer: Self-pay | Admitting: Internal Medicine

## 2020-05-23 ENCOUNTER — Other Ambulatory Visit: Payer: Self-pay

## 2020-05-23 ENCOUNTER — Encounter: Payer: Self-pay | Admitting: Internal Medicine

## 2020-05-23 ENCOUNTER — Telehealth (INDEPENDENT_AMBULATORY_CARE_PROVIDER_SITE_OTHER): Payer: PPO | Admitting: Internal Medicine

## 2020-05-23 ENCOUNTER — Telehealth: Payer: Self-pay

## 2020-05-23 DIAGNOSIS — A0471 Enterocolitis due to Clostridium difficile, recurrent: Secondary | ICD-10-CM

## 2020-05-23 DIAGNOSIS — R630 Anorexia: Secondary | ICD-10-CM | POA: Diagnosis not present

## 2020-05-23 DIAGNOSIS — F5101 Primary insomnia: Secondary | ICD-10-CM | POA: Diagnosis not present

## 2020-05-23 MED ORDER — MIRTAZAPINE 30 MG PO TABS: 30.0000 mg | ORAL_TABLET | Freq: Every day | ORAL | 5 refills | Status: DC

## 2020-05-23 NOTE — Progress Notes (Signed)
RFV: follow up for c.difficile - telephone visit  Used 2 identifier to identify patient. She is at home/provider in clinic  Patient ID: Dawn Beasley, female   DOB: 01-02-1951, 69 y.o.   MRN: [id number]  HPI 69yo F with recurrent cdifficile. Has had difficulty getting access to dificid. Has been taking remeron to help with weight gain/appetite. zinplava is just approved  Has been on vancomycin  Bid since I last saw  She reports that she had to call [employer] -- to get meds since  Now only 2-3 BM a day now which is improvement, now semi formed abd discomfort  Appetite improving her appetite, not helping her sleep  Outpatient Encounter Medications as of 05/23/2020  Medication Sig  . acetaminophen (TYLENOL) 325 MG tablet Take 2 tablets (650 mg total) by mouth every 6 (six) hours as needed for mild pain (or Fever >/= 101).  Marland Kitchen ALPRAZolam (XANAX) 0.5 MG tablet Take 1 tablet (0.5 mg total) by mouth 2 (two) times daily as needed for anxiety.  Marland Kitchen losartan (COZAAR) 100 MG tablet Take 100 mg by mouth daily.  . mirtazapine (REMERON) 15 MG tablet TAKE 1 TABLET BY MOUTH EVERYDAY AT BEDTIME  . nicotine (NICODERM CQ - DOSED IN MG/24 HOURS) 21 mg/24hr patch 21 mg patch daily x1 week then 14 mg patch daily x3 weeks then 7 mg patch daily x3 weeks and stop  . oxyCODONE (OXY IR/ROXICODONE) 5 MG immediate release tablet Take 1 tablet (5 mg total) by mouth every 6 (six) hours as needed for moderate pain.  Marland Kitchen apixaban (ELIQUIS) 5 MG TABS tablet Take 1 tablet (5 mg total) by mouth 2 (two) times daily. (Patient not taking: Reported on 05/23/2020)  . fidaxomicin (DIFICID) 200 MG TABS tablet Take 1 tablet (200 mg total) by mouth 2 (two) times daily. (Patient not taking: Reported on 05/23/2020)  . furosemide (LASIX) 20 MG tablet Take 1 tablet (20 mg total) by mouth daily. (Patient not taking: Reported on 05/23/2020)  . hydrALAZINE (APRESOLINE) 25 MG tablet Take 1 tablet (25 mg total) by mouth every 8 (eight) hours. (Patient  not taking: Reported on 05/23/2020)  . traZODone (DESYREL) 50 MG tablet Take 1 tablet (50 mg total) by mouth at bedtime. (Patient not taking: Reported on 05/23/2020)   No facility-administered encounter medications on file as of 05/23/2020.     Patient Active Problem List   Diagnosis Date Noted  . Former smoker 03/22/2020  . Healthcare maintenance 03/22/2020  . Sacral decubitus ulcer, stage II (HCC) 03/02/2020  . Acute metabolic encephalopathy 03/02/2020  . Debility 03/02/2020  . Multiple subsegmental pulmonary emboli without acute cor pulmonale (HCC)   . Delirium   . Pancytopenia (HCC) 02/12/2020  . Sepsis (HCC) 02/12/2020  . Aspiration pneumonia (HCC) 02/12/2020  . Acute on chronic respiratory failure with hypoxia (HCC) 02/12/2020  . Hyponatremia 02/12/2020  . AKI (acute kidney injury) (HCC) 02/12/2020  . Dehydration 02/12/2020  . Rhabdomyolysis 02/12/2020  . Elevated troponin 02/12/2020  . Respiratory failure (HCC) 02/12/2020  . Abnormal CT scan, colon 02/06/2020  . Nausea and vomiting 02/06/2020  . Essential hypertension   . 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 Encephalopathy acute 12/23/2016  . Transient alteration of awareness 12/23/2016  . Acute hyponatremia   . Speech disturbance   . Benzodiazepine withdrawal with complication (HCC)   . Other chronic pain 12/06/2015  . Lumbar spinal stenosis 04/12/2014     Health Maintenance Due  Topic Date Due  . Hepatitis C Screening  Never done  . TETANUS/TDAP  Never done  . MAMMOGRAM  Never done  . DEXA SCAN  Never done  . PNA vac Low Risk Adult (1 of 2 - PCV13) Never done     Review of Systems 12 point ros is negative except what is mentioned above Physical Exam  No exam for phone visit  CBC Lab Results  Component Value Date   WBC 10.9 (H) 03/08/2020   RBC 2.34 (L) 03/08/2020   HGB 8.8 (L) 03/08/2020   HCT 26.6 (L) 03/08/2020   PLT 449 (H) 03/08/2020   MCV 113.7 (H) 03/08/2020   MCH 37.6 (H) 03/08/2020   MCHC 33.1  03/08/2020   RDW 13.6 03/08/2020   LYMPHSABS 0.7 03/08/2020   MONOABS 0.8 03/08/2020   EOSABS 0.1 03/08/2020    BMET Lab Results  Component Value Date   NA 133 (L) 03/08/2020   K 4.1 03/08/2020   CL 99 03/08/2020   CO2 25 03/08/2020   GLUCOSE 108 (H) 03/08/2020   BUN 6 (L) 03/08/2020   CREATININE 0.51 03/08/2020   CALCIUM 8.8 (L) 03/08/2020   GFRNONAA >60 03/08/2020   GFRAA >60 03/08/2020      Assessment and Plan Recurrent cdiff = do 10d of dificid, and will Will schedule zinplava infusion.  Sleep/ appetite=Will give remeron 30mg  to see if  rtc in 4 wk

## 2020-05-23 NOTE — Telephone Encounter (Signed)
Zinplava PA approved. Will move forward with scheduling patient's infusion at short stay.  Zivah Mayr Loyola Mast, RN

## 2020-05-23 NOTE — Telephone Encounter (Signed)
Spoke with patient advising her she has been scheduled at short stay for a zinplava infusion on 05/31/20 at 10 am. Patient given directions on where she needs to go and verbalized understanding. Number for short stay has also been provided to the patient.  Dawn Beasley T [name]

## 2020-05-24 ENCOUNTER — Telehealth: Payer: Self-pay | Admitting: Pharmacy Technician

## 2020-05-24 ENCOUNTER — Telehealth: Payer: Self-pay

## 2020-05-24 NOTE — Telephone Encounter (Addendum)
[facility] Patient Advocate Encounter  Initiated an application for Zinplava patient assistance through [employer].  They will mail the application to her home, she will fill out and mail back.  Once approved, she wants to schedule Zinplava infusion.  I called her and gave her the phone number to [employer] to contact about the application at [phone].  [name]. [name] CPhT Specialty Pharmacy Patient [facility] for Infectious Disease Phone: [phone] Fax:  [phone]

## 2020-05-24 NOTE — Telephone Encounter (Signed)
Patient called office today to follow up on cost for infusion scheduled tomorrow. Left message with short stay to call office back with cost information.  Dawn Beasley, [place]

## 2020-05-24 NOTE — Telephone Encounter (Signed)
Spoke with Burna Mortimer at [facility] pharmacy regarding cost for infusion. Per Burna Mortimer infusion is $3800. Will send message to pharmacy to team to help with assistance for medication coverage.  Patient states she is not able to afford infusion at its current price, and will need to cancel infusion if no assistance is available. Lorenso Courier, [place]

## 2020-05-24 NOTE — Telephone Encounter (Signed)
-----   Message from Gretchen Short, CPhT sent at 05/24/2020  3:20 PM EDT ----- Regarding: Zinplava infusion Hello all,  Ms. Mollica wants to cancel the Zinplava infusion on July 15th.  She wants to get [employer] application I initiated today in hand first and approved.  The [employer] stated that they do cover copays of patients with Medicare part B and that the patient will get processing information for either the pharmacy or the hospital (they can fax in for reimbursement).  I asked her to call me once she receives the application and I will verbally help her to fill it out.  She will then need to mail it back in to them.  [name]. [name] CPhT Specialty Pharmacy Patient [facility] for Infectious Disease Phone: [phone] Fax:  [phone]

## 2020-05-24 NOTE — Telephone Encounter (Signed)
Called Short Stay to cancel infusion for 7/15. Left message requesting call back once appointment was canceled. Lorenso Courier, [place]

## 2020-05-29 ENCOUNTER — Telehealth: Payer: Self-pay

## 2020-05-29 NOTE — Telephone Encounter (Signed)
Patient called office today stating she takes her last dose of Dificid this Friday. Would like to know if she would need last c. Diff test after last dose. Would also like to know if she needs to continue current antibiotics until zimplava infusion. States she is working with pharmacy team for medication assistance. Infusion appointment canceled until application outcome is determined.  Lorenso Courier, [place]

## 2020-05-31 ENCOUNTER — Inpatient Hospital Stay (HOSPITAL_COMMUNITY): Admission: RE | Admit: 2020-05-31 | Payer: PPO | Source: Ambulatory Visit

## 2020-05-31 NOTE — Telephone Encounter (Signed)
Patient called office today to follow up on message from 7/13. States she takes last dose of Dificid on Saturday. Is not sure if she will need refills since Zinplava is on hold due to cost. Would like to know if she would need a C. Diff test as well. Is not having any diarrhea at this time, but Is constipated.  Will check with pharmacy team regarding medication assistance. Lorenso Courier, [place]

## 2020-05-31 NOTE — Telephone Encounter (Signed)
Per Md no retesting is needed at this time. Patient will need to schedule four week follow up appointment with MD. Unable to reach patient at this time. Lorenso Courier, [place]

## 2020-06-04 NOTE — Telephone Encounter (Signed)
Patient called for update. She has not yet received the zinplava patient assistance application by mail.  Will 'cc Kathie Rhodes for this update. She finished Dificid on 06/02/20. Her bowel movements have improved. She has some soft, some formed.  She feels better than she has in weeks, went to church yesterday. She does not have the urge to go when she eats.  She still has some abdominal cramping, but it is better than before.  She will call us with any symptom changes and when she gets the application in the mail. RN scheduled patient to follow up with Dr Drue Second by phone on 8/4 at 8:45. Andree Coss, RN

## 2020-06-04 NOTE — Telephone Encounter (Signed)
I gave her the phone number to [employer].  She is calling them about the application.  [name]. [name] CPhT Specialty Pharmacy Patient [facility] for Infectious Disease Phone: [phone] Fax:  [phone]

## 2020-06-14 ENCOUNTER — Other Ambulatory Visit: Payer: Self-pay | Admitting: Internal Medicine

## 2020-06-20 ENCOUNTER — Other Ambulatory Visit: Payer: Self-pay

## 2020-06-20 ENCOUNTER — Telehealth (INDEPENDENT_AMBULATORY_CARE_PROVIDER_SITE_OTHER): Payer: PPO | Admitting: Internal Medicine

## 2020-06-20 DIAGNOSIS — A0471 Enterocolitis due to Clostridium difficile, recurrent: Secondary | ICD-10-CM

## 2020-06-20 DIAGNOSIS — R630 Anorexia: Secondary | ICD-10-CM | POA: Diagnosis not present

## 2020-06-20 DIAGNOSIS — F5101 Primary insomnia: Secondary | ICD-10-CM | POA: Diagnosis not present

## 2020-06-20 MED ORDER — MIRTAZAPINE 30 MG PO TABS: 30.0000 mg | ORAL_TABLET | Freq: Every day | ORAL | 1 refills | Status: DC

## 2020-06-20 NOTE — Progress Notes (Signed)
Patient states she has been having soft/solid stools.  Patient is interested in Zinplava infusions only if she is negative. Patient would like to provide stool sample with her PCP and have him fax results.  Valarie Cones

## 2020-06-20 NOTE — Progress Notes (Signed)
Virtual Visit via Telephone Note  I connected with Dawn Beasley on 06/20/20 at  8:45 AM EDT by telephone and verified that I am speaking with the correct person using two identifiers.  Location: Patient: home Provider: provider   I discussed the limitations, risks, security and privacy concerns of performing an evaluation and management service by telephone and the availability of in person appointments. I also discussed with the patient that there may be a patient responsible charge related to this service. The patient expressed understanding and agreed to proceed.   History of Present Illness: Finished dificid 2 weeks ago. Did well, and even had constipation. 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 The patient was provided an opportunity to ask questions and all were answered. The patient agreed with the plan and demonstrated an understanding of the instructions.   The patient was advised to call back or seek an in-person evaluation if the symptoms worsen or if the condition fails to improve as anticipated.  I provided 15 minutes of non-face-to-face time during this encounter.   Judyann Munson, MD

## 2020-06-27 ENCOUNTER — Telehealth: Payer: Self-pay | Admitting: Pharmacy Technician

## 2020-06-27 DIAGNOSIS — M549 Dorsalgia, unspecified: Secondary | ICD-10-CM | POA: Diagnosis not present

## 2020-06-27 DIAGNOSIS — F418 Other specified anxiety disorders: Secondary | ICD-10-CM | POA: Diagnosis not present

## 2020-06-27 DIAGNOSIS — I10 Essential (primary) hypertension: Secondary | ICD-10-CM | POA: Diagnosis not present

## 2020-06-27 DIAGNOSIS — Z79899 Other long term (current) drug therapy: Secondary | ICD-10-CM | POA: Diagnosis not present

## 2020-06-27 DIAGNOSIS — Z6820 Body mass index (BMI) 20.0-20.9, adult: Secondary | ICD-10-CM | POA: Diagnosis not present

## 2020-06-27 DIAGNOSIS — A0472 Enterocolitis due to Clostridium difficile, not specified as recurrent: Secondary | ICD-10-CM | POA: Diagnosis not present

## 2020-06-27 DIAGNOSIS — G8929 Other chronic pain: Secondary | ICD-10-CM | POA: Diagnosis not present

## 2020-06-27 DIAGNOSIS — G4700 Insomnia, unspecified: Secondary | ICD-10-CM | POA: Diagnosis not present

## 2020-06-27 DIAGNOSIS — E7800 Pure hypercholesterolemia, unspecified: Secondary | ICD-10-CM | POA: Diagnosis not present

## 2020-06-27 NOTE — Telephone Encounter (Signed)
[facility] Patient Advocate Encounter   Was successful in obtaining a Zinplava copay assistance through [employer].   The hospital billing team can call in the copay claim to [phone] or fax it in to [phone].  The organization failed to let me or the patient know she was approved in July, but states they will mail the patient a letter today.  The copay assistance is good through 07/05/2020.   [name]. [name] CPhT Specialty Pharmacy Patient [facility] for Infectious Disease Phone: [phone] Fax:  [phone]

## 2020-06-27 NOTE — Telephone Encounter (Signed)
Per [name] tech patient approved for Zinplava copay assistance is approved through 8/19. Spoke with patient with this update and to assist with scheduling infusion.  Patient is confused about what her plan of care is. Is not sure if she needs the infusion or not..will forward message to MD to advise if infusion is necessary at this time. Patient will drop off stool specimen with PCP tomorrow. Lorenso Courier, [place]

## 2020-06-28 DIAGNOSIS — A0472 Enterocolitis due to Clostridium difficile, not specified as recurrent: Secondary | ICD-10-CM | POA: Diagnosis not present

## 2020-06-28 NOTE — Telephone Encounter (Signed)
Patient made aware and updated.

## 2020-06-28 NOTE — Telephone Encounter (Signed)
Dawn Beasley and Dawn Beasley - since patient has recovered. She doesn't need zinplava at the moment. At her next relapse she should receive it. Please let her know. Thank you

## 2020-06-28 NOTE — Telephone Encounter (Signed)
Attempted to call patient with update on infusion. Unable to reach her at this time. Voicemail is not set up. Dawn Beasley, [place]

## 2020-07-03 DIAGNOSIS — M961 Postlaminectomy syndrome, not elsewhere classified: Secondary | ICD-10-CM | POA: Diagnosis not present

## 2020-07-19 DIAGNOSIS — H25811 Combined forms of age-related cataract, right eye: Secondary | ICD-10-CM | POA: Diagnosis not present

## 2020-07-19 DIAGNOSIS — H25812 Combined forms of age-related cataract, left eye: Secondary | ICD-10-CM | POA: Diagnosis not present

## 2020-07-19 DIAGNOSIS — Z01818 Encounter for other preprocedural examination: Secondary | ICD-10-CM | POA: Diagnosis not present

## 2020-07-26 DIAGNOSIS — H25812 Combined forms of age-related cataract, left eye: Secondary | ICD-10-CM | POA: Diagnosis not present

## 2020-07-26 DIAGNOSIS — H2512 Age-related nuclear cataract, left eye: Secondary | ICD-10-CM | POA: Diagnosis not present

## 2020-08-02 DIAGNOSIS — H25811 Combined forms of age-related cataract, right eye: Secondary | ICD-10-CM | POA: Diagnosis not present

## 2020-08-02 DIAGNOSIS — H2511 Age-related nuclear cataract, right eye: Secondary | ICD-10-CM | POA: Diagnosis not present

## 2020-08-31 DIAGNOSIS — Z961 Presence of intraocular lens: Secondary | ICD-10-CM | POA: Diagnosis not present

## 2020-09-04 DIAGNOSIS — H02834 Dermatochalasis of left upper eyelid: Secondary | ICD-10-CM | POA: Diagnosis not present

## 2020-09-04 DIAGNOSIS — H02831 Dermatochalasis of right upper eyelid: Secondary | ICD-10-CM | POA: Diagnosis not present

## 2020-09-11 DIAGNOSIS — M65312 Trigger thumb, left thumb: Secondary | ICD-10-CM | POA: Diagnosis not present

## 2020-09-11 DIAGNOSIS — M65311 Trigger thumb, right thumb: Secondary | ICD-10-CM | POA: Diagnosis not present

## 2020-09-21 DIAGNOSIS — H02831 Dermatochalasis of right upper eyelid: Secondary | ICD-10-CM | POA: Diagnosis not present

## 2020-09-21 DIAGNOSIS — H02834 Dermatochalasis of left upper eyelid: Secondary | ICD-10-CM | POA: Diagnosis not present

## 2020-09-26 DIAGNOSIS — G8929 Other chronic pain: Secondary | ICD-10-CM | POA: Diagnosis not present

## 2020-09-26 DIAGNOSIS — I10 Essential (primary) hypertension: Secondary | ICD-10-CM | POA: Diagnosis not present

## 2020-09-26 DIAGNOSIS — M549 Dorsalgia, unspecified: Secondary | ICD-10-CM | POA: Diagnosis not present

## 2020-09-26 DIAGNOSIS — Z6820 Body mass index (BMI) 20.0-20.9, adult: Secondary | ICD-10-CM | POA: Diagnosis not present

## 2020-09-26 DIAGNOSIS — F418 Other specified anxiety disorders: Secondary | ICD-10-CM | POA: Diagnosis not present

## 2020-11-23 DIAGNOSIS — J209 Acute bronchitis, unspecified: Secondary | ICD-10-CM | POA: Diagnosis not present

## 2020-11-23 DIAGNOSIS — R6889 Other general symptoms and signs: Secondary | ICD-10-CM | POA: Diagnosis not present

## 2020-11-23 DIAGNOSIS — Z20822 Contact with and (suspected) exposure to covid-19: Secondary | ICD-10-CM | POA: Diagnosis not present

## 2020-12-10 DIAGNOSIS — Z79899 Other long term (current) drug therapy: Secondary | ICD-10-CM | POA: Diagnosis not present

## 2020-12-10 DIAGNOSIS — G894 Chronic pain syndrome: Secondary | ICD-10-CM | POA: Diagnosis not present

## 2020-12-10 DIAGNOSIS — G4700 Insomnia, unspecified: Secondary | ICD-10-CM | POA: Diagnosis not present

## 2020-12-10 DIAGNOSIS — Z5181 Encounter for therapeutic drug level monitoring: Secondary | ICD-10-CM | POA: Diagnosis not present

## 2020-12-11 DIAGNOSIS — M65311 Trigger thumb, right thumb: Secondary | ICD-10-CM | POA: Diagnosis not present

## 2020-12-11 DIAGNOSIS — Z6821 Body mass index (BMI) 21.0-21.9, adult: Secondary | ICD-10-CM | POA: Diagnosis not present

## 2020-12-22 IMAGING — DX DG CHEST 1V PORT
1 series · 1 of 1 positions shown · non-contrast
Comparison: Single-view of the chest 02/20/2020 and 02/17/2020.

CLINICAL DATA: Pneumonia.

EXAM:
PORTABLE CHEST 1 VIEW

[chest ap]
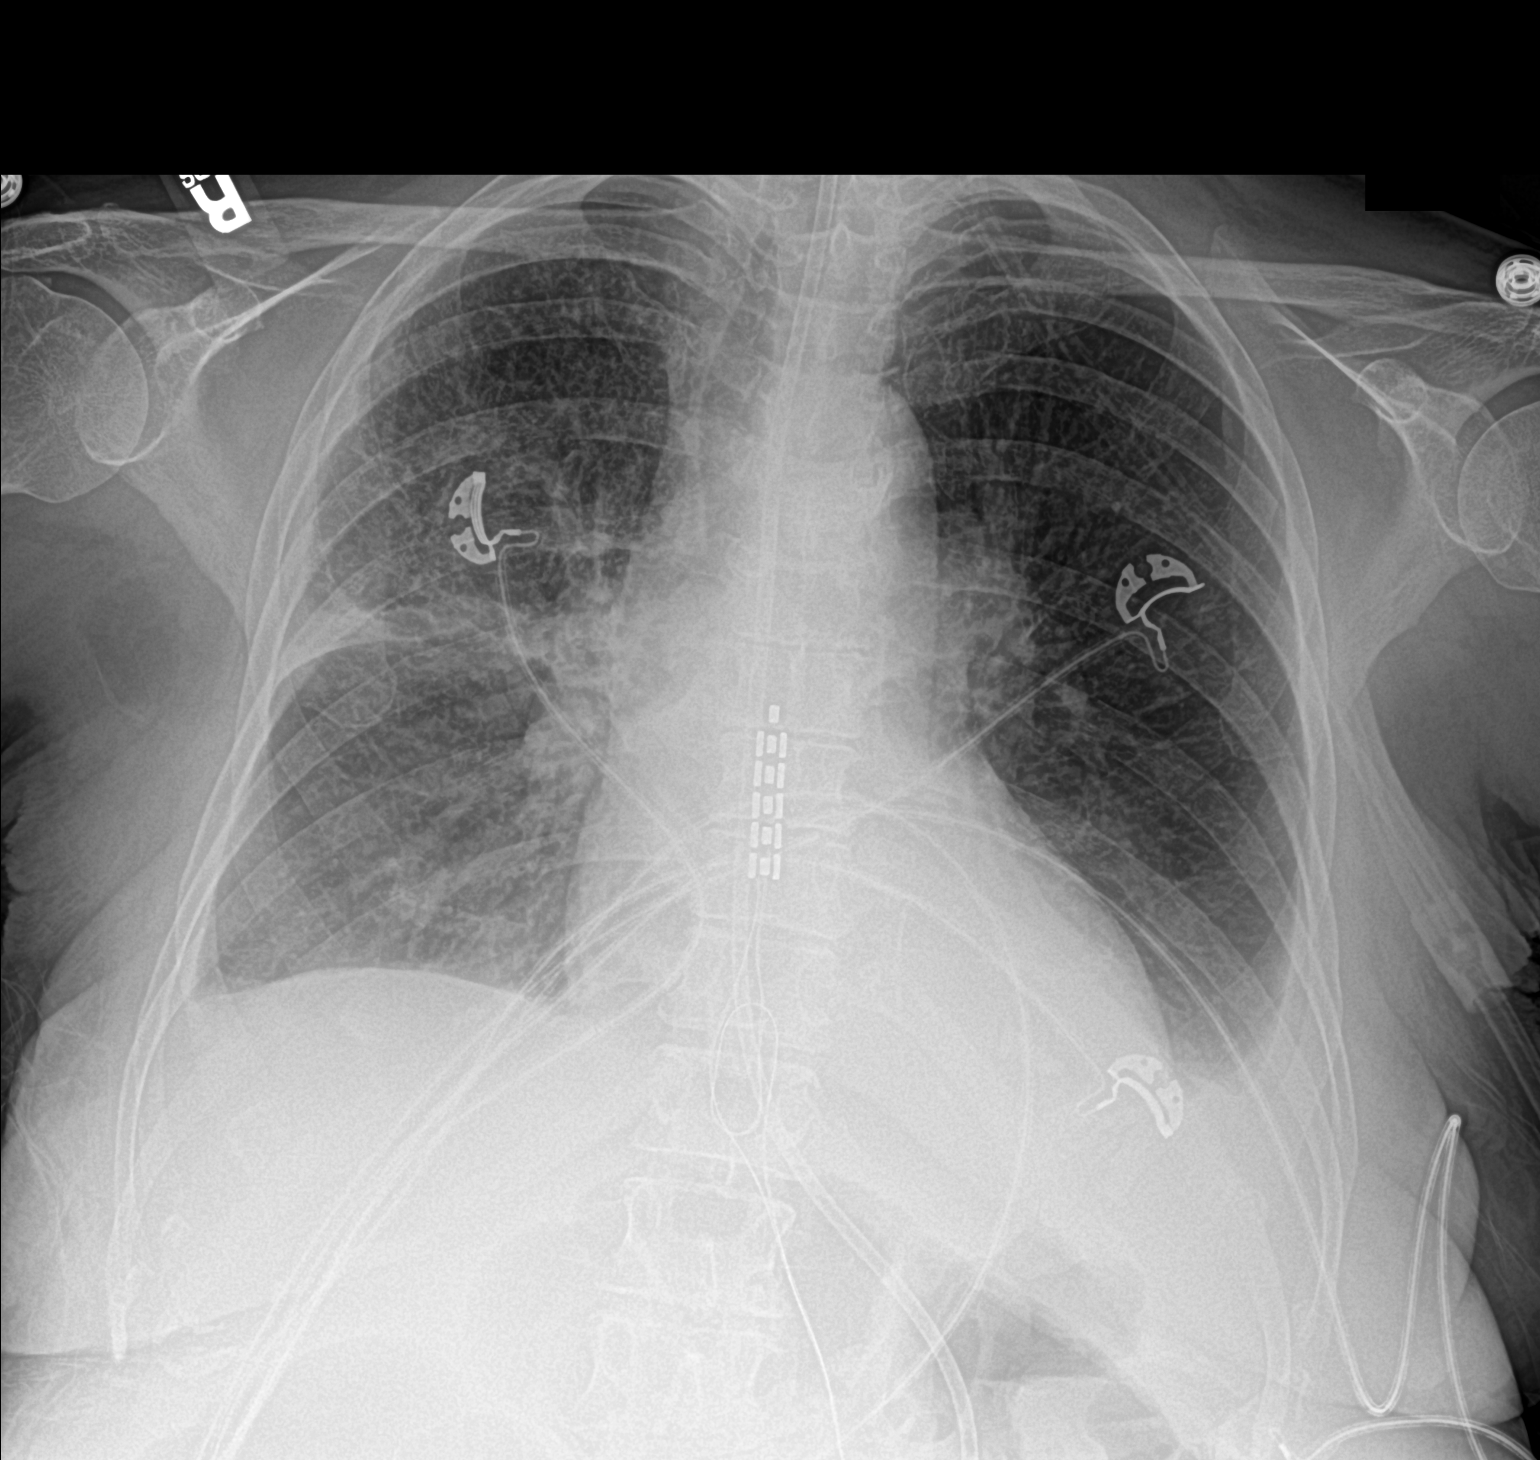

[1 of 1 positions shown; findings below may reference images not displayed]

FINDINGS: Feeding tube and spinal stimulator are again seen. Bilateral
airspace disease and small effusions have markedly improved since
the most recent exam. No pneumothorax. Heart size is normal.
Atherosclerosis noted.
IMPRESSION: Marked improvement in bilateral effusions and right worse than left
airspace disease.

## 2020-12-25 IMAGING — DX DG ABDOMEN 2V
2 series · 2 of 2 positions shown · non-contrast
Comparison: 02/12/2020

CLINICAL DATA: Abdominal distension

EXAM:
ABDOMEN - 2 VIEW

[abdomen erect]
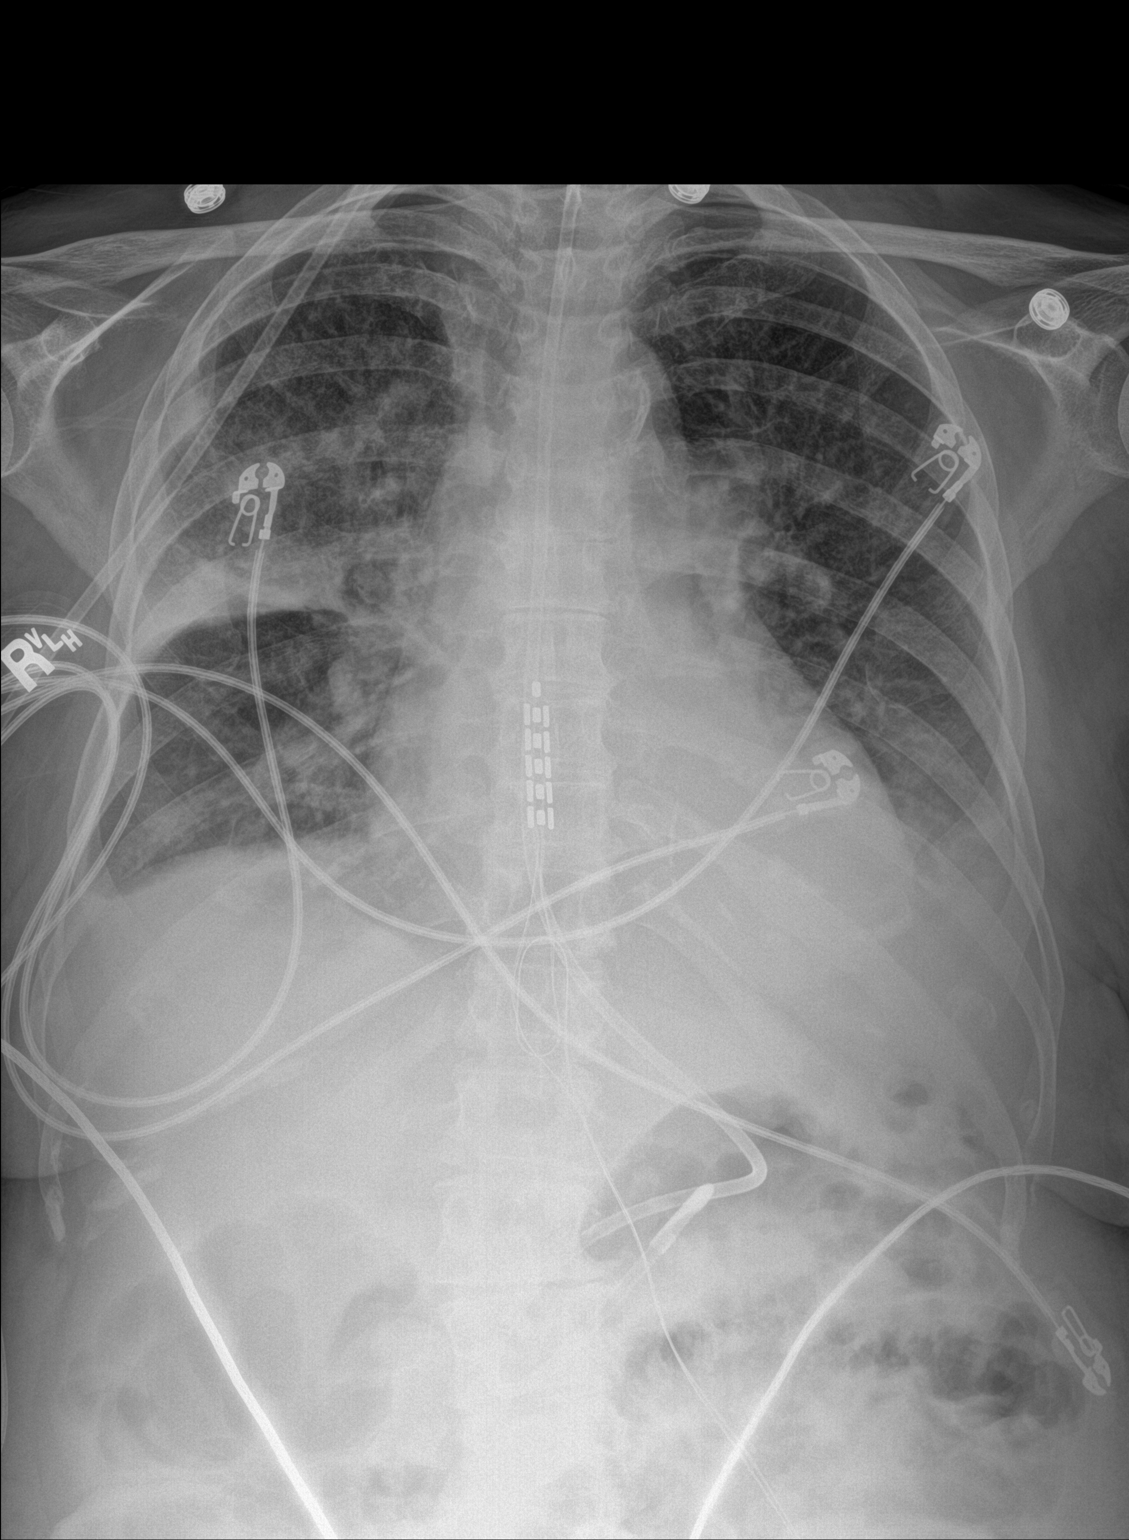

[abdomen supine]
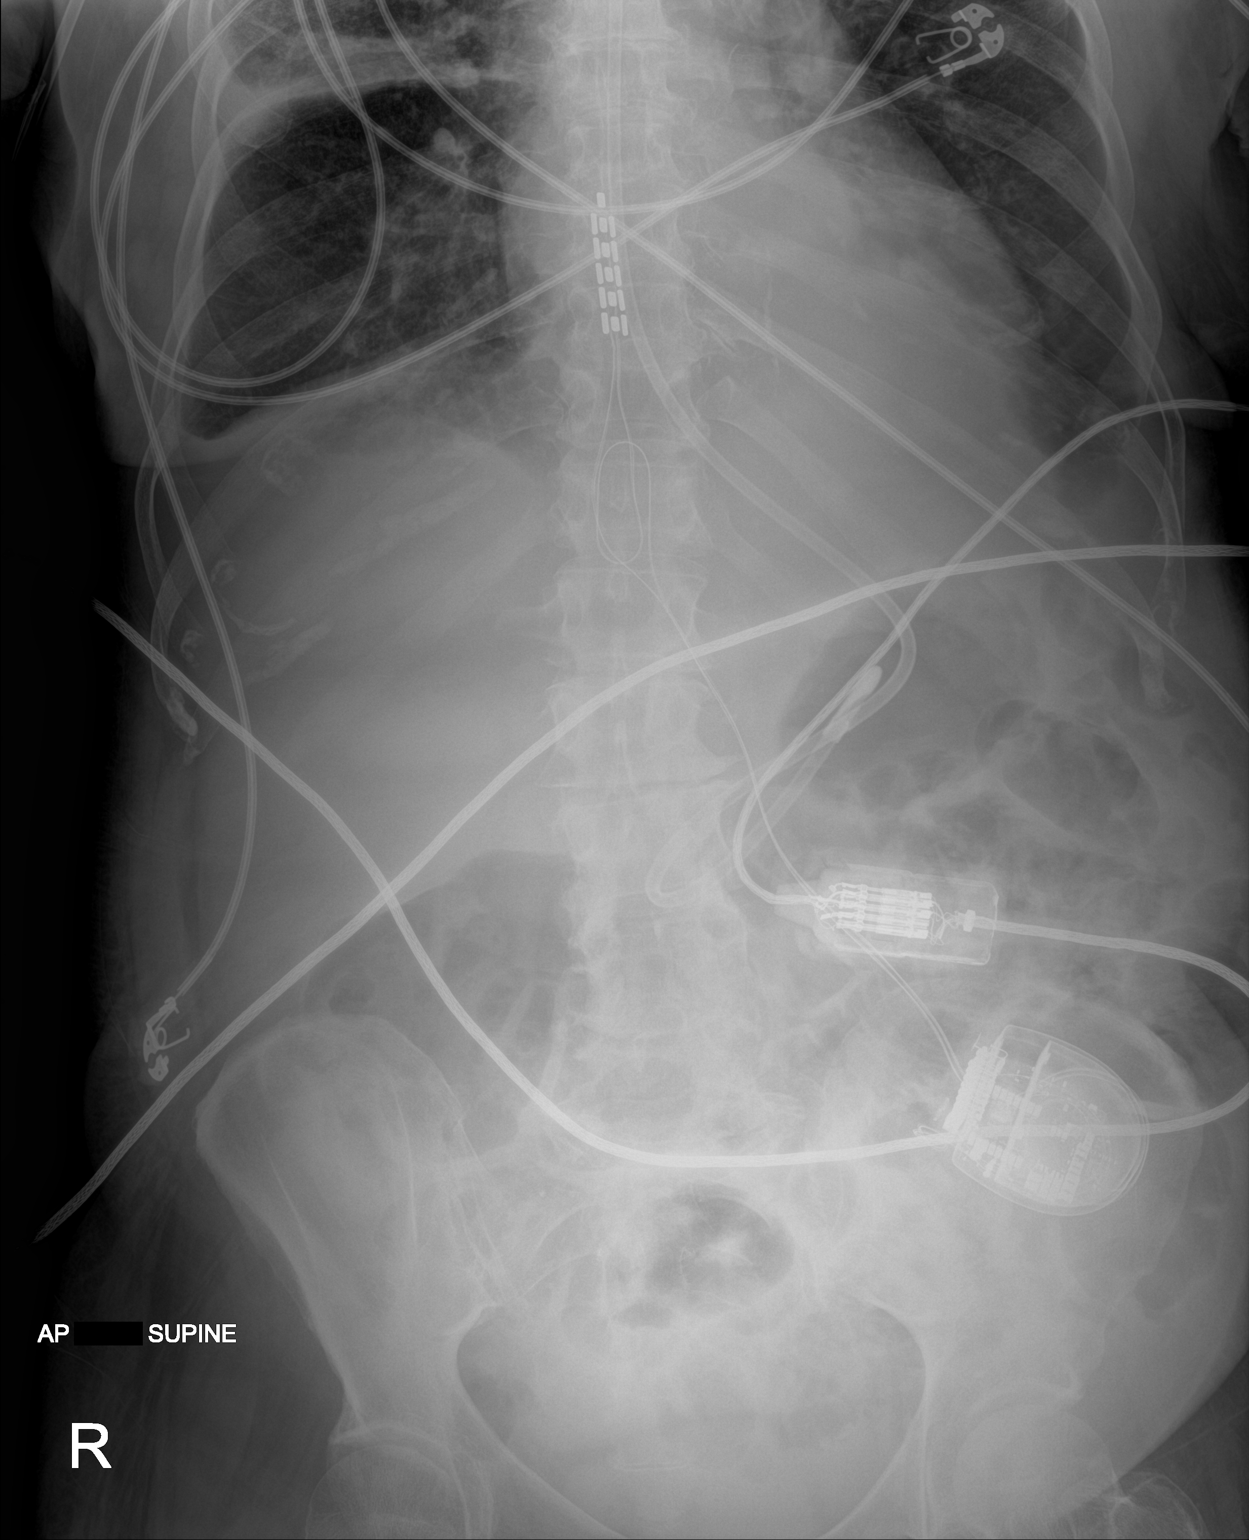

[2 of 2 positions shown; findings below may reference images not displayed]

FINDINGS: Feeding tube coiled in the stomach.

Spinal nerve stimulator projecting over the lower thoracic spine.

Cardiomediastinal contours remain mildly enlarged. Perifissural
consolidation in the right chest is increased along with increased
interstitial markings. Further consolidation at the left lung base
with obscured left hemidiaphragm and left pleural effusion. Blunting
of right costodiaphragmatic sulcus as well.

No free air beneath either right or left hemidiaphragm.

Thoracic skeletal structures are unremarkable.

Small amount of gas in the stomach. Mildly distended small bowel
loops in the abdomen without clear signs of obstruction. Gas in the
colon. Small amount of gas likely in the rectum.

No acute bone process.
IMPRESSION: 1. Persistent areas of airspace disease in the chest, nonspecific
likely with superimposed pulmonary edema.
2. Bilateral effusions as before.
3. Feeding tube coiled in the stomach.
4. Findings [DATE] reflect mild ileus.

## 2020-12-26 IMAGING — DX DG CHEST 1V PORT
1 series · 1 of 1 positions shown · non-contrast
Comparison: 02/22/2020

CLINICAL DATA: Short of breath

EXAM:
PORTABLE CHEST 1 VIEW

[chest ap]
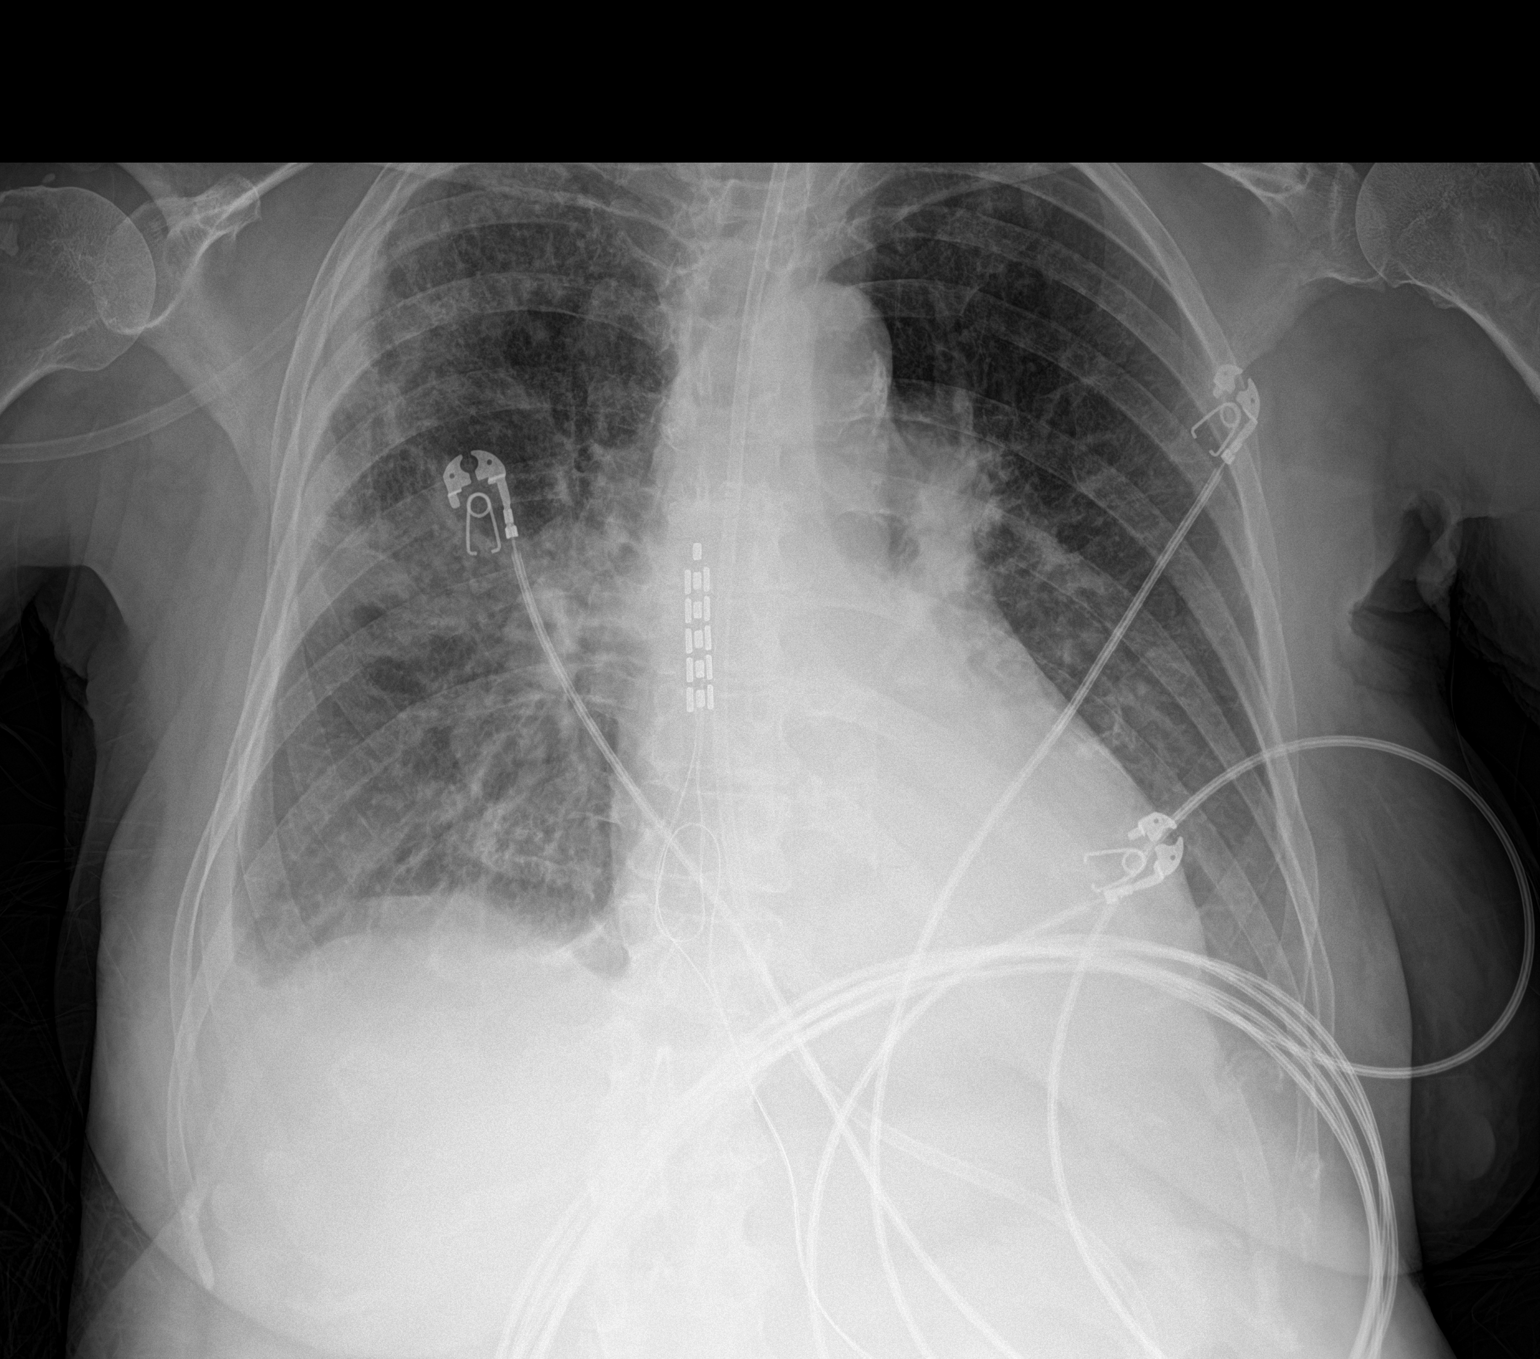

[1 of 1 positions shown; findings below may reference images not displayed]

FINDINGS: Feeding tube unchanged. Normal cardiac silhouette. Pleural
thickening fluid along the RIGHT horizontal fissure unchanged.
Increase in central venous congestion. LEFT basilar atelectasis and
effusion.
IMPRESSION: 1. Mild increase in central venous congestion.
2. Bilateral pleural effusions and basilar atelectasis.

## 2020-12-27 DIAGNOSIS — Z6822 Body mass index (BMI) 22.0-22.9, adult: Secondary | ICD-10-CM | POA: Diagnosis not present

## 2020-12-27 DIAGNOSIS — F418 Other specified anxiety disorders: Secondary | ICD-10-CM | POA: Diagnosis not present

## 2020-12-27 DIAGNOSIS — I10 Essential (primary) hypertension: Secondary | ICD-10-CM | POA: Diagnosis not present

## 2020-12-27 DIAGNOSIS — Z79899 Other long term (current) drug therapy: Secondary | ICD-10-CM | POA: Diagnosis not present

## 2020-12-27 DIAGNOSIS — M549 Dorsalgia, unspecified: Secondary | ICD-10-CM | POA: Diagnosis not present

## 2020-12-27 DIAGNOSIS — G8929 Other chronic pain: Secondary | ICD-10-CM | POA: Diagnosis not present

## 2020-12-27 IMAGING — DX DG CHEST 1V PORT
1 series · 1 of 1 positions shown · non-contrast
Comparison: February 26, 2020.

CLINICAL DATA: Shortness of breath

EXAM:
PORTABLE CHEST 1 VIEW

[chest ap]
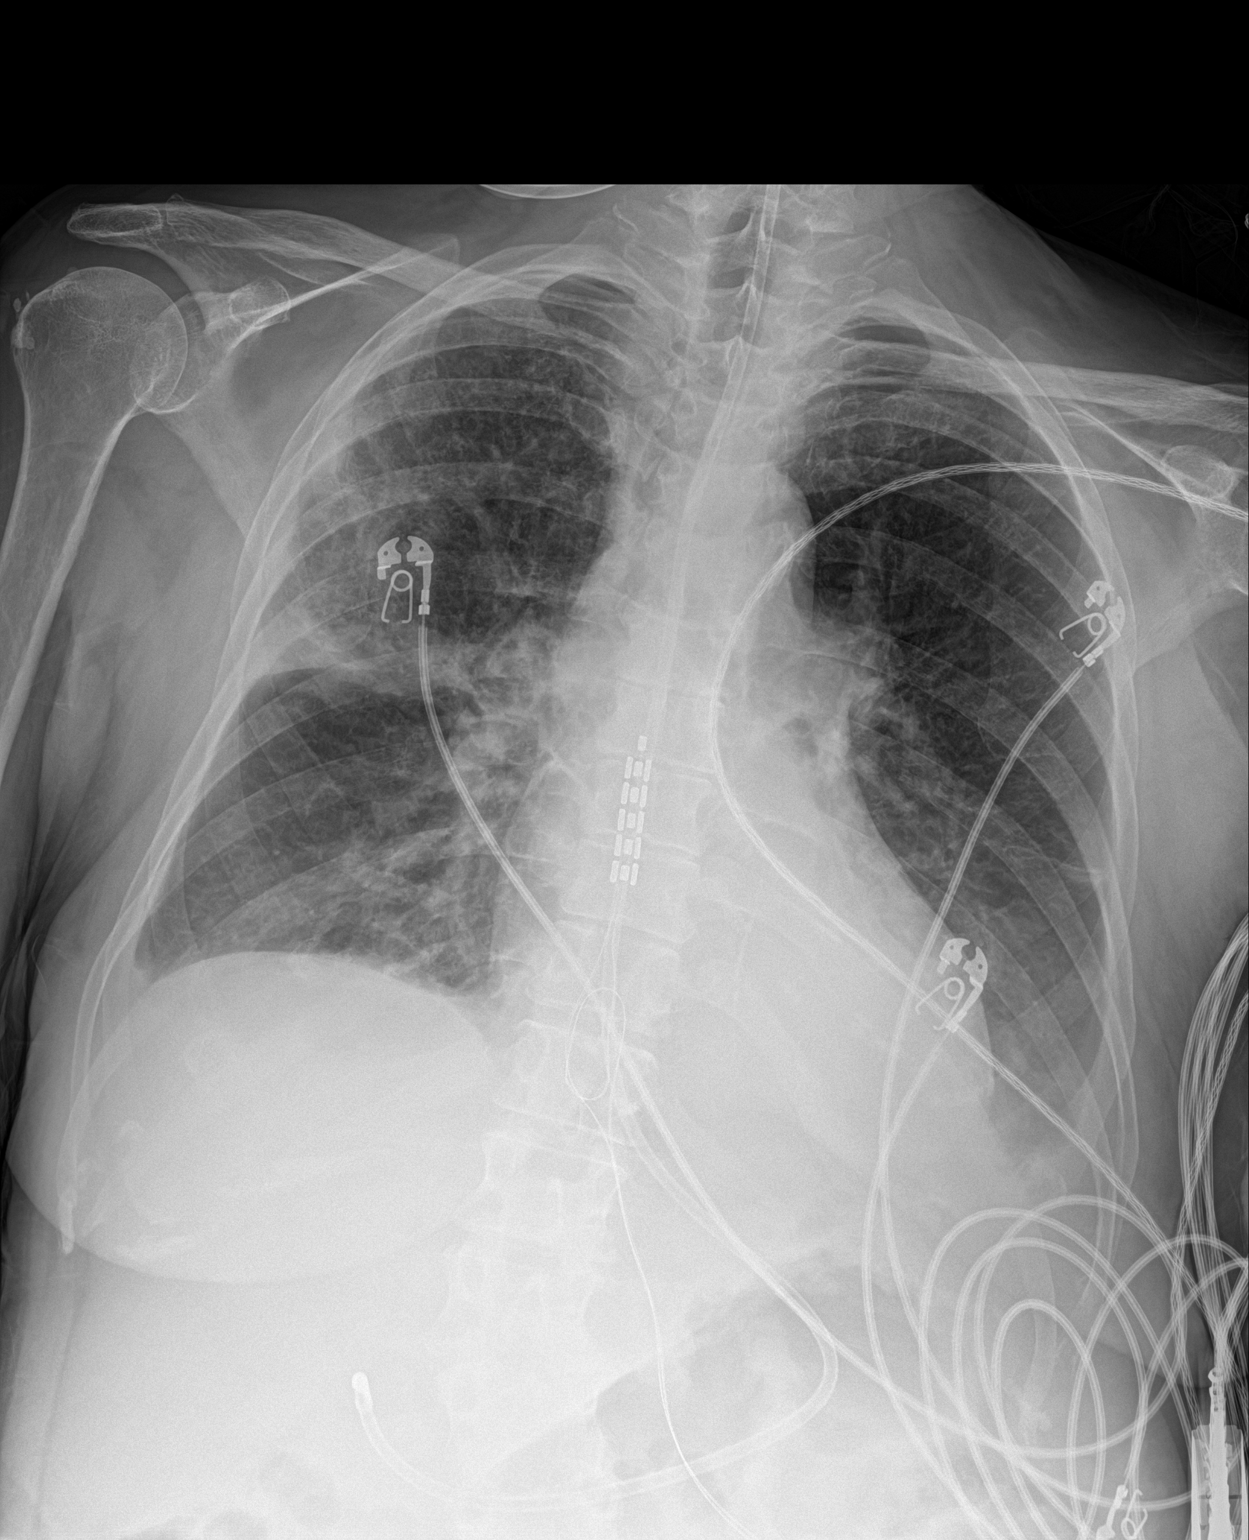

[1 of 1 positions shown; findings below may reference images not displayed]

FINDINGS: There is persistent airspace opacity in the right upper lobe with
slightly more consolidation near the right minor fissure compared to
1 day prior. There is atelectasis in the left lower lobe with small
left pleural effusion. Small right pleural effusion also noted.

Heart is upper normal in size with pulmonary vascularity normal.
Feeding tube tip is at the level of the pylorus. Thoracic stimulator
leads are in the midthoracic region, stable. There is aortic
atherosclerosis. No bone lesions.
IMPRESSION: Persistent airspace opacity right upper lobe with slightly greater
consolidation near the minor fissure on the right compared to 1 day
prior. Persistent small left pleural effusion with left lower lobe
atelectasis. Stable small right pleural effusion.

Stable cardiac silhouette.  Aortic Atherosclerosis ([id number]).

Feeding tube tip at level of pylorus.

## 2021-02-16 ENCOUNTER — Encounter (HOSPITAL_COMMUNITY): Payer: Self-pay | Admitting: *Deleted

## 2021-02-16 ENCOUNTER — Emergency Department (HOSPITAL_COMMUNITY): Payer: PPO

## 2021-02-16 ENCOUNTER — Inpatient Hospital Stay (HOSPITAL_COMMUNITY)
Admission: EM | Admit: 2021-02-16 | Discharge: 2021-02-20 | DRG: 871 | Disposition: A | Discharge status: Home / Self Care | Payer: PPO | Attending: Internal Medicine | Admitting: Internal Medicine

## 2021-02-16 DIAGNOSIS — G8929 Other chronic pain: Secondary | ICD-10-CM | POA: Diagnosis present

## 2021-02-16 DIAGNOSIS — J320 Chronic maxillary sinusitis: Secondary | ICD-10-CM | POA: Diagnosis not present

## 2021-02-16 DIAGNOSIS — J9602 Acute respiratory failure with hypercapnia: Secondary | ICD-10-CM | POA: Diagnosis not present

## 2021-02-16 DIAGNOSIS — R4020 Unspecified coma: Secondary | ICD-10-CM | POA: Diagnosis not present

## 2021-02-16 DIAGNOSIS — I248 Other forms of acute ischemic heart disease: Secondary | ICD-10-CM | POA: Diagnosis present

## 2021-02-16 DIAGNOSIS — E785 Hyperlipidemia, unspecified: Secondary | ICD-10-CM | POA: Diagnosis present

## 2021-02-16 DIAGNOSIS — G894 Chronic pain syndrome: Secondary | ICD-10-CM | POA: Diagnosis present

## 2021-02-16 DIAGNOSIS — G9341 Metabolic encephalopathy: Secondary | ICD-10-CM | POA: Diagnosis present

## 2021-02-16 DIAGNOSIS — F32A Depression, unspecified: Secondary | ICD-10-CM | POA: Diagnosis present

## 2021-02-16 DIAGNOSIS — R5381 Other malaise: Secondary | ICD-10-CM | POA: Diagnosis present

## 2021-02-16 DIAGNOSIS — J9621 Acute and chronic respiratory failure with hypoxia: Secondary | ICD-10-CM | POA: Diagnosis present

## 2021-02-16 DIAGNOSIS — J690 Pneumonitis due to inhalation of food and vomit: Secondary | ICD-10-CM | POA: Diagnosis present

## 2021-02-16 DIAGNOSIS — R000 Tachycardia, unspecified: Secondary | ICD-10-CM | POA: Diagnosis not present

## 2021-02-16 DIAGNOSIS — I5030 Unspecified diastolic (congestive) heart failure: Secondary | ICD-10-CM | POA: Diagnosis not present

## 2021-02-16 DIAGNOSIS — J9601 Acute respiratory failure with hypoxia: Secondary | ICD-10-CM | POA: Diagnosis not present

## 2021-02-16 DIAGNOSIS — I712 Thoracic aortic aneurysm, without rupture: Secondary | ICD-10-CM | POA: Diagnosis not present

## 2021-02-16 DIAGNOSIS — J3489 Other specified disorders of nose and nasal sinuses: Secondary | ICD-10-CM | POA: Diagnosis not present

## 2021-02-16 DIAGNOSIS — I6523 Occlusion and stenosis of bilateral carotid arteries: Secondary | ICD-10-CM | POA: Diagnosis not present

## 2021-02-16 DIAGNOSIS — J189 Pneumonia, unspecified organism: Secondary | ICD-10-CM | POA: Diagnosis not present

## 2021-02-16 DIAGNOSIS — A419 Sepsis, unspecified organism: Secondary | ICD-10-CM

## 2021-02-16 DIAGNOSIS — J9622 Acute and chronic respiratory failure with hypercapnia: Secondary | ICD-10-CM | POA: Diagnosis present

## 2021-02-16 DIAGNOSIS — Z20822 Contact with and (suspected) exposure to covid-19: Secondary | ICD-10-CM | POA: Diagnosis present

## 2021-02-16 DIAGNOSIS — J440 Chronic obstructive pulmonary disease with acute lower respiratory infection: Secondary | ICD-10-CM | POA: Diagnosis present

## 2021-02-16 DIAGNOSIS — R9431 Abnormal electrocardiogram [ECG] [EKG]: Secondary | ICD-10-CM | POA: Diagnosis not present

## 2021-02-16 DIAGNOSIS — R6520 Severe sepsis without septic shock: Secondary | ICD-10-CM | POA: Diagnosis present

## 2021-02-16 DIAGNOSIS — L89152 Pressure ulcer of sacral region, stage 2: Secondary | ICD-10-CM | POA: Diagnosis present

## 2021-02-16 DIAGNOSIS — J9690 Respiratory failure, unspecified, unspecified whether with hypoxia or hypercapnia: Secondary | ICD-10-CM | POA: Diagnosis not present

## 2021-02-16 DIAGNOSIS — F410 Panic disorder [episodic paroxysmal anxiety] without agoraphobia: Secondary | ICD-10-CM | POA: Diagnosis present

## 2021-02-16 DIAGNOSIS — I110 Hypertensive heart disease with heart failure: Secondary | ICD-10-CM | POA: Diagnosis present

## 2021-02-16 DIAGNOSIS — E7800 Pure hypercholesterolemia, unspecified: Secondary | ICD-10-CM | POA: Diagnosis present

## 2021-02-16 DIAGNOSIS — A413 Sepsis due to Hemophilus influenzae: Secondary | ICD-10-CM | POA: Diagnosis present

## 2021-02-16 DIAGNOSIS — Z79899 Other long term (current) drug therapy: Secondary | ICD-10-CM | POA: Diagnosis not present

## 2021-02-16 DIAGNOSIS — R0602 Shortness of breath: Secondary | ICD-10-CM | POA: Diagnosis not present

## 2021-02-16 DIAGNOSIS — J441 Chronic obstructive pulmonary disease with (acute) exacerbation: Secondary | ICD-10-CM | POA: Diagnosis present

## 2021-02-16 DIAGNOSIS — J14 Pneumonia due to Hemophilus influenzae: Secondary | ICD-10-CM | POA: Diagnosis present

## 2021-02-16 DIAGNOSIS — I6381 Other cerebral infarction due to occlusion or stenosis of small artery: Secondary | ICD-10-CM | POA: Diagnosis not present

## 2021-02-16 DIAGNOSIS — I5033 Acute on chronic diastolic (congestive) heart failure: Secondary | ICD-10-CM | POA: Diagnosis present

## 2021-02-16 DIAGNOSIS — Z781 Physical restraint status: Secondary | ICD-10-CM

## 2021-02-16 DIAGNOSIS — J9600 Acute respiratory failure, unspecified whether with hypoxia or hypercapnia: Secondary | ICD-10-CM | POA: Diagnosis present

## 2021-02-16 DIAGNOSIS — Z87891 Personal history of nicotine dependence: Secondary | ICD-10-CM

## 2021-02-16 DIAGNOSIS — R1310 Dysphagia, unspecified: Secondary | ICD-10-CM | POA: Diagnosis present

## 2021-02-16 DIAGNOSIS — R404 Transient alteration of awareness: Secondary | ICD-10-CM | POA: Diagnosis not present

## 2021-02-16 DIAGNOSIS — I10 Essential (primary) hypertension: Secondary | ICD-10-CM | POA: Diagnosis present

## 2021-02-16 DIAGNOSIS — R4182 Altered mental status, unspecified: Secondary | ICD-10-CM | POA: Diagnosis not present

## 2021-02-16 DIAGNOSIS — R52 Pain, unspecified: Secondary | ICD-10-CM | POA: Diagnosis not present

## 2021-02-16 LAB — CBC WITH DIFFERENTIAL/PLATELET
Abs Immature Granulocytes: 0 10*3/uL (ref 0.00–0.07)
Basophils Absolute: 0 10*3/uL (ref 0.0–0.1)
Basophils Relative: 0 %
Eosinophils Absolute: 0 10*3/uL (ref 0.0–0.5)
Eosinophils Relative: 0 %
HCT: 44.7 % (ref 36.0–46.0)
Hemoglobin: 14.5 g/dL (ref 12.0–15.0)
Lymphocytes Relative: 0 %
Lymphs Abs: 0 10*3/uL — ABNORMAL LOW (ref 0.7–4.0)
MCH: 35.5 pg — ABNORMAL HIGH (ref 26.0–34.0)
MCHC: 32.4 g/dL (ref 30.0–36.0)
MCV: 109.6 fL — ABNORMAL HIGH (ref 80.0–100.0)
Monocytes Absolute: 1.3 10*3/uL — ABNORMAL HIGH (ref 0.1–1.0)
Monocytes Relative: 10 %
Neutro Abs: 11.6 10*3/uL — ABNORMAL HIGH (ref 1.7–7.7)
Neutrophils Relative %: 90 %
Platelets: 170 10*3/uL (ref 150–400)
RBC: 4.08 MIL/uL (ref 3.87–5.11)
RDW: 13.2 % (ref 11.5–15.5)
WBC: 12.9 10*3/uL — ABNORMAL HIGH (ref 4.0–10.5)
nRBC: 0 % (ref 0.0–0.2)
nRBC: 0 /100 WBC

## 2021-02-16 LAB — COMPREHENSIVE METABOLIC PANEL
ALT: 14 U/L (ref 0–44)
AST: 24 U/L (ref 15–41)
Albumin: 3.5 g/dL (ref 3.5–5.0)
Alkaline Phosphatase: 77 U/L (ref 38–126)
Anion gap: 10 (ref 5–15)
BUN: 16 mg/dL (ref 8–23)
CO2: 30 mmol/L (ref 22–32)
Calcium: 9.2 mg/dL (ref 8.9–10.3)
Chloride: 90 mmol/L — ABNORMAL LOW (ref 98–111)
Creatinine, Ser: 0.76 mg/dL (ref 0.44–1.00)
GFR, Estimated: >60 mL/min (ref >60)
Glucose, Bld: 150 mg/dL — ABNORMAL HIGH (ref 70–99)
Potassium: 5.4 mmol/L — ABNORMAL HIGH (ref 3.5–5.1)
Sodium: 130 mmol/L — ABNORMAL LOW (ref 135–145)
Total Bilirubin: 0.9 mg/dL (ref 0.3–1.2)
Total Protein: 7.2 g/dL (ref 6.5–8.1)

## 2021-02-16 LAB — I-STAT ARTERIAL BLOOD GAS, ED
Acid-Base Excess: 7 mmol/L — ABNORMAL HIGH (ref 0.0–2.0)
Bicarbonate: 36.4 mmol/L — ABNORMAL HIGH (ref 20.0–28.0)
Calcium, Ion: 1.24 mmol/L (ref 1.15–1.40)
HCT: 46 % (ref 36.0–46.0)
Hemoglobin: 15.6 g/dL — ABNORMAL HIGH (ref 12.0–15.0)
O2 Saturation: 100 %
Patient temperature: 98.6
Potassium: 4.9 mmol/L (ref 3.5–5.1)
Sodium: 127 mmol/L — ABNORMAL LOW (ref 135–145)
TCO2: 39 mmol/L — ABNORMAL HIGH (ref 22–32)
pCO2 arterial: 75 mmHg (ref 32.0–48.0)
pH, Arterial: 7.294 — ABNORMAL LOW (ref 7.350–7.450)
pO2, Arterial: 530 mmHg — ABNORMAL HIGH (ref 83.0–108.0)

## 2021-02-16 LAB — ETHANOL: Alcohol, Ethyl (B): <10 mg/dL (ref <10)

## 2021-02-16 MED ORDER — ROCURONIUM BROMIDE 50 MG/5ML IV SOLN
INTRAVENOUS | Status: AC | PRN
  Administered 2021-02-16: 80 mg via INTRAVENOUS

## 2021-02-16 MED ORDER — ETOMIDATE 2 MG/ML IV SOLN
INTRAVENOUS | Status: AC | PRN
  Administered 2021-02-16: 20 mg via INTRAVENOUS

## 2021-02-16 MED ORDER — PROPOFOL 1000 MG/100ML IV EMUL
5.0000 ug/kg/min | INTRAVENOUS | Status: DC
Start: 2021-02-16 — End: 2021-02-18
  Administered 2021-02-16: 5 ug/kg/min via INTRAVENOUS
  Administered 2021-02-17: 15 ug/kg/min via INTRAVENOUS
  Administered 2021-02-17: 40 ug/kg/min via INTRAVENOUS
  Administered 2021-02-17: 70 ug/kg/min via INTRAVENOUS
  Administered 2021-02-18: 60 ug/kg/min via INTRAVENOUS
  Administered 2021-02-18: 70 ug/kg/min via INTRAVENOUS
  Filled 2021-02-16 (×5): qty 100

## 2021-02-16 NOTE — ED Provider Notes (Signed)
[facility] EMERGENCY DEPARTMENT Provider Note   CSN: [id number] Arrival date & time: 02/16/21  2132     History Chief Complaint  Patient presents with  . Altered Mental Status    Dawn Beasley is a 70 y.o. female.  Patient is a 70 year old female with a history of AAA, chronic back pain with multiple back surgeries with lumbar spinal cord stimulator insertion in 2017, hyperlipidemia, and chronic opioid use who presents with altered mental status. Patient arrives via EMS. They were originally called out to patient's home for stroke like symptoms. Patients husband had reported that after dinner patient had intermittent slurring of her speech and decreased response to him. Patient was alert awake and oriented when EMS arrived. She was moving all extremities equally. They identified equal grip strength. En route to the hospital patient had a few episodes of unresponsiveness and hypoxia. She was placed on NRB mask and transported here. Arrives unresponsive. Patient unable to speak. She is not responding to stimuli. Eyes closed. Airway filled with secretions. Not responsive to pain. Unable to obtain further history due to patient's mental status.         Past Medical History:  Diagnosis Date  . AAA (abdominal aortic aneurysm) (HCC)   . Anxiety   . Arthritis   . Back pain   . Cervical disc herniation   . Depression   . 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 Hypercholesteremia   . Hypertension   . Panic disorder   . Stenosis, spinal, lumbar     Patient Active Problem List   Diagnosis Date Noted  . Former smoker 03/22/2020  . Healthcare maintenance 03/22/2020  . Sacral decubitus ulcer, stage II (HCC) 03/02/2020  . Acute metabolic encephalopathy 03/02/2020  . Debility 03/02/2020  . Multiple subsegmental pulmonary emboli without acute cor pulmonale (HCC)   . Delirium   . Pancytopenia (HCC) 02/12/2020  . Sepsis (HCC) 02/12/2020  . Aspiration pneumonia (HCC) 02/12/2020   . Acute on chronic respiratory failure with hypoxia (HCC) 02/12/2020  . Hyponatremia 02/12/2020  . AKI (acute kidney injury) (HCC) 02/12/2020  . Dehydration 02/12/2020  . Rhabdomyolysis 02/12/2020  . Elevated troponin 02/12/2020  . Respiratory failure (HCC) 02/12/2020  . Abnormal CT scan, colon 02/06/2020  . Nausea and vomiting 02/06/2020  . Essential hypertension   . 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 Encephalopathy acute 12/23/2016  . Transient alteration of awareness 12/23/2016  . Acute hyponatremia   . Speech disturbance   . Benzodiazepine withdrawal with complication (HCC)   . Other chronic pain 12/06/2015  . Lumbar spinal stenosis 04/12/2014    Past Surgical History:  Procedure Laterality Date  . APPENDECTOMY    . BACK SURGERY    . CARPAL TUNNEL RELEASE    . LUMBAR LAMINECTOMY/DECOMPRESSION MICRODISCECTOMY N/A 04/12/2014   Procedure: MICRO LUMBAR DECOMPRESSION LUMBAR THREE TO FOUR,LUMBAR  FOUR TO FIVE;  Surgeon: Javier Docker, MD;  Location: [facility] ORS;  Service: Orthopedics;  Laterality: N/A;  . MAXOFACIAL SURGERY    . SPINAL CORD STIMULATOR INSERTION N/A 12/06/2015   Procedure: LUMBAR SPINAL CORD STIMULATOR INSERTION;  Surgeon: Venita Lick, MD;  Location: [facility] OR;  Service: Orthopedics;  Laterality: N/A;     OB History   No obstetric history on file.     Family History  Problem Relation Age of Onset  . Cancer Sister     Social History   Tobacco Use  . Smoking status: Former Smoker    Packs/day: 1.00  Years: 40.00    Pack years: 40.00    Quit date: 02/11/2020    Years since quitting: 1.0  . Smokeless tobacco: Never Used  Vaping Use  . Vaping Use: Never used  Substance Use Topics  . Alcohol use: Yes    Comment: OCCASIONAL  . Drug use: Yes    Types: Oxycodone    Home Medications Prior to Admission medications   Medication Sig Start Date End Date Taking? Authorizing Provider  acetaminophen (TYLENOL) 325 MG tablet Take 2 tablets (650 mg total) by  mouth every 6 (six) hours as needed for mild pain (or Fever >/= 101). 03/02/20   Drema Dallas, MD  ALPRAZolam Prudy Feeler) 0.5 MG tablet Take 1 tablet (0.5 mg total) by mouth 2 (two) times daily as needed for anxiety. 03/12/20   Angiulli, Mcarthur Rossetti, PA-C  fidaxomicin (DIFICID) 200 MG TABS tablet Take 1 tablet (200 mg total) by mouth 2 (two) times daily. 05/11/20   Judyann Munson, MD  losartan (COZAAR) 100 MG tablet Take 100 mg by mouth daily. 03/25/20   [provider]  mirtazapine (REMERON) 30 MG tablet Take 1 tablet (30 mg total) by mouth at bedtime. 06/20/20   Judyann Munson, MD  nicotine (NICODERM CQ - DOSED IN MG/24 HOURS) 21 mg/24hr patch 21 mg patch daily x1 week then 14 mg patch daily x3 weeks then 7 mg patch daily x3 weeks and stop 03/12/20   Angiulli, Mcarthur Rossetti, PA-C  oxyCODONE (OXY IR/ROXICODONE) 5 MG immediate release tablet Take 1 tablet (5 mg total) by mouth every 6 (six) hours as needed for moderate pain. 03/12/20   Angiulli, Mcarthur Rossetti, PA-C    Allergies    Celebrex [celecoxib] and Sulfa antibiotics  Review of Systems   Review of Systems  Unable to perform ROS: Mental status change    Physical Exam Updated Vital Signs BP (!) 176/98   Pulse (!) 121   Temp (!) 100.4 F (38 C)   Resp (!) 24   Wt 48 kg Comment: estimate from last clinic visit  SpO2 99%   BMI 19.35 kg/m   Physical Exam Vitals and nursing note reviewed.  Constitutional:      Comments: Unresponsive NRB mask in place Sitting up in stretcher  HENT:     Head: Normocephalic and atraumatic.     Right Ear: External ear normal.     Left Ear: External ear normal.     Mouth/Throat:     Mouth: Mucous membranes are moist.     Comments: Filled with secretions Eyes:     General:        Right eye: No discharge.        Left eye: No discharge.     Comments: Pupils equal and reactive, 3mm  Cardiovascular:     Rate and Rhythm: Regular rhythm. Tachycardia present.  Pulmonary:     Breath sounds: Rhonchi present.      Comments: Decreased respiratory effort, still breathing on own, however hypoxic without evidence for distress given mental status Abdominal:     Palpations: Abdomen is soft.     Tenderness: There is no abdominal tenderness.  Musculoskeletal:     Cervical back: Neck supple.     Right lower leg: No edema.     Left lower leg: No edema.  Skin:    General: Skin is warm and dry.  Neurological:     Mental Status: She is unresponsive.     GCS: GCS eye subscore is 1. GCS verbal  subscore is 1. GCS motor subscore is 1.     Comments: GCS 3T     ED Results / Procedures / Treatments   Labs (all labs ordered are listed, but only abnormal results are displayed) Labs Reviewed  COMPREHENSIVE METABOLIC PANEL - Abnormal; Notable for the following components:      Result Value   Sodium 130 (*)    Potassium 5.4 (*)    Chloride 90 (*)    Glucose, Bld 150 (*)    All other components within normal limits  CBC WITH DIFFERENTIAL/PLATELET - Abnormal; Notable for the following components:   WBC 12.9 (*)    MCV 109.6 (*)    MCH 35.5 (*)    Neutro Abs 11.6 (*)    Lymphs Abs 0.0 (*)    Monocytes Absolute 1.3 (*)    All other components within normal limits  I-STAT ARTERIAL BLOOD GAS, ED - Abnormal; Notable for the following components:   pH, Arterial 7.294 (*)    pCO2 arterial 75.0 (*)    pO2, Arterial 530 (*)    Bicarbonate 36.4 (*)    TCO2 39 (*)    Acid-Base Excess 7.0 (*)    Sodium 127 (*)    Hemoglobin 15.6 (*)    All other components within normal limits  RESP PANEL BY RT-PCR (FLU A&B, COVID) ARPGX2  CULTURE, BLOOD (ROUTINE X 2)  CULTURE, BLOOD (ROUTINE X 2)  URINE CULTURE  ETHANOL  URINALYSIS, COMPLETE (UACMP) WITH MICROSCOPIC  AMMONIA  LACTIC ACID, PLASMA  RAPID URINE DRUG SCREEN, HOSP PERFORMED  PROTIME-INR  APTT    EKG None  Radiology CT Head Wo Contrast  Result Date: 02/16/2021 CLINICAL DATA:  Mental status unknown cause. EXAM: CT HEAD WITHOUT CONTRAST TECHNIQUE:  Contiguous axial images were obtained from the base of the skull through the vertex without intravenous contrast. COMPARISON:  CT head 02/15/2020 FINDINGS: Brain: Patchy and confluent areas of decreased attenuation are noted throughout the deep and periventricular white matter of the cerebral hemispheres bilaterally, compatible with chronic microvascular ischemic disease. Chronic left basal ganglia lacunar infarctions. Interval development of chronic appearing second lacunar infarction of the left basal ganglia posteriorly (3:18). No evidence of large-territorial acute infarction. No parenchymal hemorrhage. No mass lesion. No extra-axial collection. No mass effect or midline shift. No hydrocephalus. Basilar cisterns are patent. Vascular: No hyperdense vessel. Atherosclerotic calcifications are present within the cavernous internal carotid arteries. Skull: No acute fracture or focal lesion. Sinuses/Orbits: Almost complete opacification of the left maxillary sinus. Mucosal thickening and frothy secretions within bilateral nasal cavities and nasopharynx. Otherwise remaining paranasal sinuses and mastoid air cells are clear. The orbits are unremarkable. Other: Partially visualized endotracheal and enteric tubes. IMPRESSION: 1. No acute intracranial abnormality in a patient with a couple of left basal ganglia chronic lacunar infarctions (one of which is new compared to 2021). 2. Frothy secretions within bilateral nasal cavities/nasopharynx as well as almost complete opacification of the left maxillary sinus. Electronically Signed   By: Tish FredericksonMorgane  Naveau M.D.   On: 02/16/2021 22:54   DG Chest Portable 1 View  Result Date: 02/16/2021 CLINICAL DATA:  Shortness of breath. Altered mental status. Intubation. EXAM: PORTABLE CHEST 1 VIEW COMPARISON:  02/27/2020 FINDINGS: Endotracheal tube is present with tip measuring about 2.9 cm above the carina. Enteric tube tip is off the field of view but below the left hemidiaphragm.  Spinal stimulator leads at the midthoracic region. Normal heart size. No vascular congestion, edema, or consolidation. No pleural effusions. No pneumothorax.  Mediastinal contours appear intact. Calcification of the aorta. IMPRESSION: Appliances appear in satisfactory position. No evidence of active pulmonary disease. Electronically Signed   By: Burman Nieves M.D.   On: 02/16/2021 22:06    Procedures Procedure Name: Intubation Date/Time: 02/16/2021 11:41 PM Performed by: Rohini Jaroszewski, [place], MD Pre-anesthesia Checklist: Suction available, Patient being monitored, Emergency Drugs available and Patient identified Induction Type: Rapid sequence Ventilation: Mask ventilation without difficulty Laryngoscope Size: Glidescope Grade View: Grade III Tube size: 7.0 mm Number of attempts: 2 Airway Equipment and Method: Rigid stylet Placement Confirmation: ETT inserted through vocal cords under direct vision and CO2 detector Secured at: 24 cm Tube secured with: ETT holder Difficulty Due To: Difficulty was unanticipated and Difficult Airway- due to anterior larynx       Medications Ordered in ED Medications  propofol (DIPRIVAN) 1000 MG/100ML infusion (15 mcg/kg/min  48 kg Intravenous Rate/Dose Change 02/16/21 2306)  etomidate (AMIDATE) injection (20 mg Intravenous Given 02/16/21 2143)  rocuronium (ZEMURON) injection (80 mg Intravenous Given 02/16/21 2144)    ED Course  I have reviewed the triage vital signs and the nursing notes.  Pertinent labs & imaging results that were available during my care of the patient were reviewed by me and considered in my medical decision making (see chart for details).    MDM Rules/Calculators/A&P                          Patient is a 70 year old female who presents unresponsive with EMS.   Critically ill on arrival. Hypoxic on NRB mask with copious secretions. Tachycardic. Blood pressure stable. No response to sternal rub. No response to pain. Patient not  protecting her airway. Decision made to intubate. Procedure documented as described above.   Unclear etiology for patient's altered mental status tonight. CTH ordered to evaluate for intracranial lesion. No abnormalities found. CXR without abnormalities, confirmed ETT location.  Blood gas with hypercarbia and acidosis. RR increased. Strong suspicion for aspiration. Possible small stroke as patient's husband reported slurred speech earlier but EMS report intact motor exam. Will need further workup.  Critical care medicine consulted as patient requires ICU care.  Patient to be admitted to ICU.  Final Clinical Impression(s) / ED Diagnoses Final diagnoses:  None    Rx / DC Orders ED Discharge Orders    None       Tamea Bai, [place], MD 02/17/21 1458    Gerhard Munch, MD 02/17/21 1534

## 2021-02-16 NOTE — ED Triage Notes (Signed)
Pt from home by [facility] for altered mental status. Husband called EMS for stroke like symptoms. EMS reported intermittent episodes of unresponsiveness then being alert and following commands. Pt initially reported to have equal grips and strength in all extremities. Sats started at 60% which increased with NRB to 99%. Husband reported at 6pm yesterday after dinner, pt speech and mental status became sluggish. Hx of spinal stimulator and takes oxycodone. 2mg  narcan IV given pta without improvement of mental status.  On arrival, pt obtunded, having to be suctioned, not protecting airway. Staff preparing for intubation

## 2021-02-17 ENCOUNTER — Encounter (HOSPITAL_COMMUNITY): Payer: Self-pay | Admitting: Radiology

## 2021-02-17 ENCOUNTER — Emergency Department (HOSPITAL_COMMUNITY): Payer: PPO

## 2021-02-17 DIAGNOSIS — Z87891 Personal history of nicotine dependence: Secondary | ICD-10-CM | POA: Diagnosis not present

## 2021-02-17 DIAGNOSIS — G9341 Metabolic encephalopathy: Secondary | ICD-10-CM | POA: Diagnosis present

## 2021-02-17 DIAGNOSIS — J9621 Acute and chronic respiratory failure with hypoxia: Secondary | ICD-10-CM | POA: Diagnosis present

## 2021-02-17 DIAGNOSIS — E7800 Pure hypercholesterolemia, unspecified: Secondary | ICD-10-CM | POA: Diagnosis present

## 2021-02-17 DIAGNOSIS — Z781 Physical restraint status: Secondary | ICD-10-CM | POA: Diagnosis not present

## 2021-02-17 DIAGNOSIS — J9602 Acute respiratory failure with hypercapnia: Secondary | ICD-10-CM

## 2021-02-17 DIAGNOSIS — I5033 Acute on chronic diastolic (congestive) heart failure: Secondary | ICD-10-CM | POA: Diagnosis present

## 2021-02-17 DIAGNOSIS — J9600 Acute respiratory failure, unspecified whether with hypoxia or hypercapnia: Secondary | ICD-10-CM | POA: Insufficient documentation

## 2021-02-17 DIAGNOSIS — J9622 Acute and chronic respiratory failure with hypercapnia: Secondary | ICD-10-CM | POA: Diagnosis present

## 2021-02-17 DIAGNOSIS — Z20822 Contact with and (suspected) exposure to covid-19: Secondary | ICD-10-CM | POA: Diagnosis present

## 2021-02-17 DIAGNOSIS — E785 Hyperlipidemia, unspecified: Secondary | ICD-10-CM | POA: Diagnosis present

## 2021-02-17 DIAGNOSIS — J690 Pneumonitis due to inhalation of food and vomit: Secondary | ICD-10-CM | POA: Diagnosis present

## 2021-02-17 DIAGNOSIS — J441 Chronic obstructive pulmonary disease with (acute) exacerbation: Secondary | ICD-10-CM | POA: Diagnosis present

## 2021-02-17 DIAGNOSIS — R6520 Severe sepsis without septic shock: Secondary | ICD-10-CM | POA: Diagnosis present

## 2021-02-17 DIAGNOSIS — R5381 Other malaise: Secondary | ICD-10-CM | POA: Diagnosis present

## 2021-02-17 DIAGNOSIS — I248 Other forms of acute ischemic heart disease: Secondary | ICD-10-CM | POA: Diagnosis present

## 2021-02-17 DIAGNOSIS — A413 Sepsis due to Hemophilus influenzae: Secondary | ICD-10-CM | POA: Diagnosis present

## 2021-02-17 DIAGNOSIS — J189 Pneumonia, unspecified organism: Secondary | ICD-10-CM

## 2021-02-17 DIAGNOSIS — F410 Panic disorder [episodic paroxysmal anxiety] without agoraphobia: Secondary | ICD-10-CM | POA: Diagnosis present

## 2021-02-17 DIAGNOSIS — Z79899 Other long term (current) drug therapy: Secondary | ICD-10-CM | POA: Diagnosis not present

## 2021-02-17 DIAGNOSIS — F32A Depression, unspecified: Secondary | ICD-10-CM | POA: Diagnosis present

## 2021-02-17 DIAGNOSIS — J14 Pneumonia due to Hemophilus influenzae: Secondary | ICD-10-CM | POA: Diagnosis present

## 2021-02-17 DIAGNOSIS — J440 Chronic obstructive pulmonary disease with acute lower respiratory infection: Secondary | ICD-10-CM | POA: Diagnosis present

## 2021-02-17 DIAGNOSIS — I5030 Unspecified diastolic (congestive) heart failure: Secondary | ICD-10-CM | POA: Diagnosis not present

## 2021-02-17 DIAGNOSIS — R1310 Dysphagia, unspecified: Secondary | ICD-10-CM | POA: Diagnosis present

## 2021-02-17 DIAGNOSIS — G8929 Other chronic pain: Secondary | ICD-10-CM | POA: Diagnosis present

## 2021-02-17 DIAGNOSIS — I10 Essential (primary) hypertension: Secondary | ICD-10-CM | POA: Diagnosis not present

## 2021-02-17 DIAGNOSIS — L89152 Pressure ulcer of sacral region, stage 2: Secondary | ICD-10-CM | POA: Diagnosis present

## 2021-02-17 DIAGNOSIS — I110 Hypertensive heart disease with heart failure: Secondary | ICD-10-CM | POA: Diagnosis present

## 2021-02-17 DIAGNOSIS — J9601 Acute respiratory failure with hypoxia: Secondary | ICD-10-CM

## 2021-02-17 LAB — GLUCOSE, CAPILLARY
Glucose-Capillary: 112 mg/dL — ABNORMAL HIGH (ref 70–99)
Glucose-Capillary: 108 mg/dL — ABNORMAL HIGH (ref 70–99)
Glucose-Capillary: 97 mg/dL (ref 70–99)
Glucose-Capillary: 100 mg/dL — ABNORMAL HIGH (ref 70–99)

## 2021-02-17 LAB — HIV ANTIBODY (ROUTINE TESTING W REFLEX): HIV Screen 4th Generation wRfx: NONREACTIVE

## 2021-02-17 LAB — TRIGLYCERIDES: Triglycerides: 78 mg/dL (ref <150)

## 2021-02-17 LAB — TROPONIN I (HIGH SENSITIVITY)
Troponin I (High Sensitivity): 33 ng/L — ABNORMAL HIGH (ref <18)
Troponin I (High Sensitivity): 34 ng/L — ABNORMAL HIGH (ref <18)

## 2021-02-17 LAB — CBC
HCT: 39.1 % (ref 36.0–46.0)
Hemoglobin: 12.6 g/dL (ref 12.0–15.0)
MCH: 36.2 pg — ABNORMAL HIGH (ref 26.0–34.0)
MCHC: 32.2 g/dL (ref 30.0–36.0)
MCV: 112.4 fL — ABNORMAL HIGH (ref 80.0–100.0)
Platelets: 164 10*3/uL (ref 150–400)
RBC: 3.48 MIL/uL — ABNORMAL LOW (ref 3.87–5.11)
RDW: 13.3 % (ref 11.5–15.5)
WBC: 10.8 10*3/uL — ABNORMAL HIGH (ref 4.0–10.5)
nRBC: 0 % (ref 0.0–0.2)

## 2021-02-17 LAB — BASIC METABOLIC PANEL
Anion gap: 11 (ref 5–15)
BUN: 16 mg/dL (ref 8–23)
CO2: 31 mmol/L (ref 22–32)
Calcium: 9 mg/dL (ref 8.9–10.3)
Chloride: 88 mmol/L — ABNORMAL LOW (ref 98–111)
Creatinine, Ser: 0.88 mg/dL (ref 0.44–1.00)
GFR, Estimated: >60 mL/min (ref >60)
Glucose, Bld: 115 mg/dL — ABNORMAL HIGH (ref 70–99)
Potassium: 4.5 mmol/L (ref 3.5–5.1)
Sodium: 130 mmol/L — ABNORMAL LOW (ref 135–145)

## 2021-02-17 LAB — URINALYSIS, COMPLETE (UACMP) WITH MICROSCOPIC
Bacteria, UA: NONE SEEN
Bilirubin Urine: NEGATIVE
Glucose, UA: NEGATIVE mg/dL
Ketones, ur: 5 mg/dL — AB
Leukocytes,Ua: NEGATIVE
Nitrite: NEGATIVE
Protein, ur: >=300 mg/dL — AB
Specific Gravity, Urine: 1.024 (ref 1.005–1.030)
pH: 5 (ref 5.0–8.0)

## 2021-02-17 LAB — PHOSPHORUS: Phosphorus: 3.3 mg/dL (ref 2.5–4.6)

## 2021-02-17 LAB — APTT: aPTT: 31 seconds (ref 24–36)

## 2021-02-17 LAB — RESP PANEL BY RT-PCR (FLU A&B, COVID) ARPGX2
Influenza A by PCR: NEGATIVE
Influenza B by PCR: NEGATIVE
SARS Coronavirus 2 by RT PCR: NEGATIVE

## 2021-02-17 LAB — RAPID URINE DRUG SCREEN, HOSP PERFORMED
Amphetamines: NOT DETECTED
Barbiturates: NOT DETECTED
Benzodiazepines: POSITIVE — AB
Cocaine: NOT DETECTED
Opiates: NOT DETECTED
Tetrahydrocannabinol: NOT DETECTED

## 2021-02-17 LAB — LACTIC ACID, PLASMA
Lactic Acid, Venous: 2.2 mmol/L (ref 0.5–1.9)
Lactic Acid, Venous: 1.8 mmol/L (ref 0.5–1.9)

## 2021-02-17 LAB — MAGNESIUM: Magnesium: 1.9 mg/dL (ref 1.7–2.4)

## 2021-02-17 LAB — PROTIME-INR
INR: 1 (ref 0.8–1.2)
Prothrombin Time: 12.3 seconds (ref 11.4–15.2)

## 2021-02-17 LAB — AMMONIA: Ammonia: 57 umol/L — ABNORMAL HIGH (ref 9–35)

## 2021-02-17 LAB — MRSA PCR SCREENING: MRSA by PCR: NEGATIVE

## 2021-02-17 LAB — BRAIN NATRIURETIC PEPTIDE: B Natriuretic Peptide: 542.6 pg/mL — ABNORMAL HIGH (ref 0.0–100.0)

## 2021-02-17 MED ORDER — ALPRAZOLAM 0.5 MG PO TABS: 0.5000 mg | ORAL_TABLET | Freq: Two times a day (BID) | ORAL | Status: DC | PRN

## 2021-02-17 MED ORDER — OXYCODONE HCL 5 MG PO TABS: 5.0000 mg | ORAL_TABLET | Freq: Four times a day (QID) | ORAL | Status: DC | PRN

## 2021-02-17 MED ORDER — FENTANYL 2500MCG IN NS 250ML (10MCG/ML) PREMIX INFUSION
0.0000 ug/h | INTRAVENOUS | Status: DC
  Administered 2021-02-17: 25 ug/h via INTRAVENOUS
  Filled 2021-02-17: qty 250

## 2021-02-17 MED ORDER — ACETAMINOPHEN 160 MG/5ML PO SOLN
650.0000 mg | ORAL | Status: DC | PRN
  Administered 2021-02-17: 650 mg
  Filled 2021-02-17: qty 20.3

## 2021-02-17 MED ORDER — ALBUMIN HUMAN 5 % IV SOLN
12.5000 g | Freq: Once | INTRAVENOUS | Status: DC
  Filled 2021-02-17: qty 250

## 2021-02-17 MED ORDER — IOHEXOL 350 MG/ML SOLN
75.0000 mL | Freq: Once | INTRAVENOUS | Status: AC | PRN
  Administered 2021-02-17: 75 mL via INTRAVENOUS

## 2021-02-17 MED ORDER — SODIUM CHLORIDE 0.9 % IV SOLN
2.0000 g | Freq: Two times a day (BID) | INTRAVENOUS | Status: DC
  Administered 2021-02-17 – 2021-02-18 (×3): 2 g via INTRAVENOUS
  Filled 2021-02-17 (×3): qty 2

## 2021-02-17 MED ORDER — PANTOPRAZOLE SODIUM 40 MG IV SOLR
40.0000 mg | Freq: Every day | INTRAVENOUS | Status: DC
  Administered 2021-02-17 (×2): 40 mg via INTRAVENOUS
  Filled 2021-02-17 (×2): qty 40

## 2021-02-17 MED ORDER — IPRATROPIUM-ALBUTEROL 0.5-2.5 (3) MG/3ML IN SOLN
3.0000 mL | Freq: Four times a day (QID) | RESPIRATORY_TRACT | Status: DC
  Administered 2021-02-17 – 2021-02-19 (×9): 3 mL via RESPIRATORY_TRACT
  Filled 2021-02-17 (×9): qty 3

## 2021-02-17 MED ORDER — CHLORHEXIDINE GLUCONATE 0.12% ORAL RINSE (MEDLINE KIT)
15.0000 mL | Freq: Two times a day (BID) | OROMUCOSAL | Status: DC
  Administered 2021-02-17 – 2021-02-20 (×6): 15 mL via OROMUCOSAL

## 2021-02-17 MED ORDER — POLYETHYLENE GLYCOL 3350 17 G PO PACK: 17.0000 g | PACK | Freq: Every day | ORAL | Status: DC | PRN

## 2021-02-17 MED ORDER — CHLORHEXIDINE GLUCONATE CLOTH 2 % EX PADS
6.0000 | MEDICATED_PAD | Freq: Every day | CUTANEOUS | Status: DC
  Administered 2021-02-17 – 2021-02-20 (×4): 6 via TOPICAL

## 2021-02-17 MED ORDER — FUROSEMIDE 10 MG/ML IJ SOLN
20.0000 mg | Freq: Four times a day (QID) | INTRAMUSCULAR | Status: AC
  Administered 2021-02-17 (×4): 20 mg via INTRAVENOUS
  Filled 2021-02-17 (×5): qty 2

## 2021-02-17 MED ORDER — FENTANYL CITRATE (PF) 100 MCG/2ML IJ SOLN
25.0000 ug | INTRAMUSCULAR | Status: DC | PRN
  Administered 2021-02-17 (×3): 100 ug via INTRAVENOUS
  Administered 2021-02-17: 50 ug via INTRAVENOUS
  Administered 2021-02-17: 100 ug via INTRAVENOUS
  Filled 2021-02-17 (×4): qty 2

## 2021-02-17 MED ORDER — ONDANSETRON HCL 4 MG/2ML IJ SOLN: 4.0000 mg | Freq: Four times a day (QID) | INTRAMUSCULAR | Status: DC | PRN

## 2021-02-17 MED ORDER — ENOXAPARIN SODIUM 30 MG/0.3ML ~~LOC~~ SOLN
30.0000 mg | SUBCUTANEOUS | Status: DC
  Filled 2021-02-17 (×2): qty 0.3

## 2021-02-17 MED ORDER — VANCOMYCIN HCL 1000 MG/200ML IV SOLN
1000.0000 mg | INTRAVENOUS | Status: DC
  Administered 2021-02-18: 1000 mg via INTRAVENOUS
  Filled 2021-02-17: qty 200

## 2021-02-17 MED ORDER — VANCOMYCIN HCL 1000 MG/200ML IV SOLN
1000.0000 mg | Freq: Once | INTRAVENOUS | Status: AC
  Administered 2021-02-17: 1000 mg via INTRAVENOUS
  Filled 2021-02-17: qty 200

## 2021-02-17 MED ORDER — ORAL CARE MOUTH RINSE
15.0000 mL | OROMUCOSAL | Status: DC
  Administered 2021-02-17 – 2021-02-18 (×11): 15 mL via OROMUCOSAL

## 2021-02-17 MED ORDER — ENOXAPARIN SODIUM 40 MG/0.4ML ~~LOC~~ SOLN
40.0000 mg | SUBCUTANEOUS | Status: DC
  Administered 2021-02-17 – 2021-02-19 (×3): 40 mg via SUBCUTANEOUS
  Filled 2021-02-17 (×3): qty 0.4

## 2021-02-17 MED ORDER — ACETAMINOPHEN 500 MG PO TABS
500.0000 mg | ORAL_TABLET | Freq: Once | ORAL | Status: AC
  Administered 2021-02-17: 500 mg via NASOGASTRIC
  Filled 2021-02-17: qty 1

## 2021-02-17 MED ORDER — BUDESONIDE 0.5 MG/2ML IN SUSP
0.5000 mg | Freq: Two times a day (BID) | RESPIRATORY_TRACT | Status: DC
  Administered 2021-02-17 – 2021-02-20 (×7): 0.5 mg via RESPIRATORY_TRACT
  Filled 2021-02-17 (×7): qty 2

## 2021-02-17 MED ORDER — DOCUSATE SODIUM 100 MG PO CAPS: 100.0000 mg | ORAL_CAPSULE | Freq: Two times a day (BID) | ORAL | Status: DC | PRN

## 2021-02-17 NOTE — ED Notes (Signed)
Patient transported to CT 

## 2021-02-17 NOTE — H&P (Signed)
NAME:  Dawn Beasley, MRN:  [id number], DOB:  26-Aug-1951, LOS: 0 ADMISSION DATE:  02/16/2021,   History of Present Illness:  This is a 70 year old white female that presented to the emergency room from home.  The patient's husband noticed that the patient was slurring speech while at dinner.  She had also become less responsive.  EMS was called while in the ambulance she was much more responsive following commands and then acutely decompensated and became obtunded and hypoxic.  Patient was placed on 100% nonrebreather that time.  On arrival emergency room she remained unconscious unresponsive nonverbal.  Patient was emergently intubated in the emergency room.  EMS attempted Narcan prior to arrival to the emergency room.  There is no response to the Narcan.  Limited history is available about her recent medical conditions.  When the patient became obtunded there is no tonic-clonic or abnormal movement seen.  There is no postictal state seen.  Pertinent  Medical History   AAA Chronic back pain Cervical disc herniation Depression Hypertension Panic disorder Spinal stenosis of lumbar region Status post lumbar spine stimulator  Significant Hospital Events: Including procedures, antibiotic start and stop dates in addition to other pertinent events   . 02/17/2021 patient was intubated in the emergency room.  Interim History / Subjective:    Objective   Blood pressure 114/78, pulse (!) 110, temperature (!) 102.3 F (39.1 C), resp. rate (!) 25, weight 48 kg, SpO2 100 %.    Vent Mode: PRVC FiO2 (%):  [30 %-100 %] 60 % Set Rate:  [16 bmp-24 bmp] 24 bmp Vt Set:  [400 mL] 400 mL PEEP:  [5 cmH20] 5 cmH20 Plateau Pressure:  [23 cmH20-28 cmH20] 23 cmH20  No intake or output data in the 24 hours ending 02/17/21 0120 Filed Weights   02/16/21 2207  Weight: 48 kg    Examination: General: No acute distress  HENT: Atraumatic/normocephalic mucous membranes are moist patient is orally intubated  with endotracheal tube. Lungs: Bilateral coarse Rales greater in the bases in the apices but present in all lung fields Cardiovascular: Regular rate no murmur rub or gallop appreciated Abdomen: Soft minimally distended positive bowel sounds.  Hypertympanic.  No rebound/rigidity/guarding though limited by neurologic status. Extremities: Distal pulse intact x4.  No significant edema.  No cyanosis Neuro: Unconscious/unresponsive pupils are equal and reactive.  Positive cough positive gag GU: Foley catheter intact  Labs/imaging that I havepersonally reviewed  (right click and "Reselect all SmartList Selections" daily)    Resolved Hospital Problem list     Assessment & Plan:  Acute respiratory failure hypercapnic and hypoxic Acute decompensated congestive heart failure Waxing waning neurologic condition-encephalopathy   Plan: Patient be admitted to the intensive care for further work-up. Standard ventilator protocol was initiated. Bedside echocardiogram was performed.  Estimated ejection fraction was 15 to 20%.  No pericardial effusion seen.  Left and right atrial dilatation.  RV dilatation.  IVC appears to be dilated and has minimal response to to respiration. Propofol was initiated for sedation. Formal echo has been ordered. Covid testing is pending at this time. Lasix 20 mg IV every 6 hours been started Would consider milrinone if she does not have a brisk response to the Lasix. Serial troponin and BnP.   Best practice (right click and "Reselect all SmartList Selections" daily)  Diet:  NPO Pain/Anxiety/Delirium protocol (if indicated): Yes (RASS goal -1) VAP protocol (if indicated): Yes DVT prophylaxis: LMWH GI prophylaxis: PPI-Protonix Glucose control: Continue to monitor blood sugar Central  venous access:  N/A Arterial line:  N/A Foley:  Yes, and it is still needed Mobility:  bed rest  PT consulted: N/A Last date of multidisciplinary goals of care discussion []  Code  Status:  full code Disposition: Admit to the intensive care unit  Labs   CBC: Recent Labs  Lab 02/16/21 2212 02/16/21 2250  WBC 12.9*  --   NEUTROABS 11.6*  --   HGB 14.5 15.6*  HCT 44.7 46.0  MCV 109.6*  --   PLT 170  --     Basic Metabolic Panel: Recent Labs  Lab 02/16/21 2212 02/16/21 2250  NA 130* 127*  K 5.4* 4.9  CL 90*  --   CO2 30  --   GLUCOSE 150*  --   BUN 16  --   CREATININE 0.76  --   CALCIUM 9.2  --    GFR: CrCl cannot be calculated (Unknown ideal weight.). Recent Labs  Lab 02/16/21 2212 02/16/21 2213  WBC 12.9*  --   LATICACIDVEN  --  2.2*    Liver Function Tests: Recent Labs  Lab 02/16/21 2212  AST 24  ALT 14  ALKPHOS 77  BILITOT 0.9  PROT 7.2  ALBUMIN 3.5   No results for input(s): LIPASE, AMYLASE in the last 168 hours. Recent Labs  Lab 02/16/21 2213  AMMONIA 57*    ABG    Component Value Date/Time   PHART 7.294 (L) 02/16/2021 2250   PCO2ART 75.0 (HH) 02/16/2021 2250   PO2ART 530 (H) 02/16/2021 2250   HCO3 36.4 (H) 02/16/2021 2250   TCO2 39 (H) 02/16/2021 2250   ACIDBASEDEF 4.0 (H) 02/12/2020 0511   O2SAT 100.0 02/16/2021 2250     Coagulation Profile: Recent Labs  Lab 02/16/21 2320  INR 1.0    Cardiac Enzymes: No results for input(s): CKTOTAL, CKMB, CKMBINDEX, TROPONINI in the last 168 hours.  HbA1C: No results found for: HGBA1C  CBG: No results for input(s): GLUCAP in the last 168 hours.  Review of Systems:   Unable to obtain secondary to patient being intubated.  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   Surgical History:   Past Surgical History:  Procedure Laterality Date  . APPENDECTOMY    . BACK SURGERY    . CARPAL TUNNEL RELEASE    . LUMBAR  LAMINECTOMY/DECOMPRESSION MICRODISCECTOMY N/A 04/12/2014   Procedure: MICRO LUMBAR DECOMPRESSION LUMBAR THREE TO FOUR,LUMBAR  FOUR TO FIVE;  Surgeon: 04/14/2014, MD;  Location: [facility];  Service: Orthopedics;  Laterality: N/A;  . MAXOFACIAL SURGERY    . SPINAL CORD STIMULATOR INSERTION N/A 12/06/2015   Procedure: LUMBAR SPINAL CORD STIMULATOR INSERTION;  Surgeon: 12/08/2015, MD;  Location: [facility] OR;  Service: Orthopedics;  Laterality: N/A;     Social History:   reports that she quit smoking about a year ago. She has a 40.00 pack-year smoking history. She has never used smokeless tobacco. She reports current alcohol use. She reports current drug use. Drug: Oxycodone.   Family History:  Her family history includes Cancer in her sister.   Allergies Allergies  Allergen Reactions  . Celebrex [Celecoxib] Swelling    Throat swelling  . Sulfa Antibiotics Swelling    Throat swelling     Home Medications  Prior to Admission medications   Medication Sig Start Date End Date  Taking? Authorizing Provider  acetaminophen (TYLENOL) 325 MG tablet Take 2 tablets (650 mg total) by mouth every 6 (six) hours as needed for mild pain (or Fever >/= 101). 03/02/20   Drema Dallas, MD  ALPRAZolam Prudy Feeler) 0.5 MG tablet Take 1 tablet (0.5 mg total) by mouth 2 (two) times daily as needed for anxiety. 03/12/20   Angiulli, Mcarthur Rossetti, PA-C  fidaxomicin (DIFICID) 200 MG TABS tablet Take 1 tablet (200 mg total) by mouth 2 (two) times daily. 05/11/20   Judyann Munson, MD  losartan (COZAAR) 100 MG tablet Take 100 mg by mouth daily. 03/25/20   [provider]  mirtazapine (REMERON) 30 MG tablet Take 1 tablet (30 mg total) by mouth at bedtime. 06/20/20   Judyann Munson, MD  nicotine (NICODERM CQ - DOSED IN MG/24 HOURS) 21 mg/24hr patch 21 mg patch daily x1 week then 14 mg patch daily x3 weeks then 7 mg patch daily x3 weeks and stop 03/12/20   Angiulli, Mcarthur Rossetti, PA-C  oxyCODONE (OXY IR/ROXICODONE) 5 MG immediate  release tablet Take 1 tablet (5 mg total) by mouth every 6 (six) hours as needed for moderate pain. 03/12/20   AngiulliMcarthur Rossetti, PA-C     Critical care time: 55 minutes

## 2021-02-17 NOTE — Progress Notes (Addendum)
eLink Physician-Brief Progress Note Patient Name: Dawn Beasley DOB: [birth year] MRN: [id number]   Date of Service  02/17/2021  HPI/Events of Note  Brief new admit note: 29 f from home with hx of AAA, HTN, Lumbar spine stimulator with AMS, fever 102 in ED. Admitted for Type 2 resp failure on Vent. Received narcan w/o improvement for AMS. Takes oxycodone at home.   Notes, labs, meds reviewed.  Camera: Following simple commands for bed side RN. In synchrony with vent. PIP 30.400/04/10/59%. On propofol 30. VS: 139/80, sinus tachy 120. sats 100%. MAP 98.  Discussed with RN. Weight 65.4 kg.   Data: 7.29/75/530/36 Hg >15, sod 127, K 4.9 improving from > 5. Cr 0.76. LA 2.WBC 12.9 Mag was low 1.4 LFT ok. NH3 at 57.  CTPA: No evidence of pulmonary embolus.  Extensive ground-glass and tree-in-bud nodular densities throughout the lungs, most pronounced in the upper lobes and lung bases. This is most likely infectious or inflammatory.  Extensive coronary artery disease.  4.2 cm ascending thoracic aortic aneurysm, stable since prior study. Recommend annual imaging followup by CTA or MRA.   A/P: 1. Encephalopathy/AMS:CTH no acute changes, Type 2 resp failure. Meeting Sepsis criteria. Source mostly from aspiration pneumonitis. 2. HTN. Decompensated CHF as per notes.  3. Chronic low back pain 4. Stable AAA.   eICU Interventions  - will give Vanc and cefpime. Has blood cultures drawn. Covid/flu neg. UA neg.follow up LA.due to suspected CHF- will not give 30 cc/kg bolus. MAP is good. ( EF normal in 2021). - restraints ordered. - discussed with bed side RN. - folow troponin/ECHO. Low EF as per notes. Get pro BNP. - Lovenox as VTE. On SUP. - lung protective ventilation. SAT/SBT daily in AM - on lasix.  - follow Mag level and replace if low     Intervention Category Major Interventions: Respiratory failure - evaluation and management Evaluation Type: New Patient  Evaluation  Dawn Beasley 02/17/2021, 4:10 AM

## 2021-02-17 NOTE — ED Notes (Signed)
Dawn Beasley [phone] daughter in law just wants to know how she is doing

## 2021-02-17 NOTE — ED Notes (Signed)
Attempted to give reportx1 

## 2021-02-17 NOTE — Progress Notes (Signed)
Attempted to wean fio2 to 30% however pt spo2 dropped to 83%.

## 2021-02-17 NOTE — Progress Notes (Signed)
Chronic back pain on oxycodone and Xanax. Smoker, COPD History of recurrent C. difficile  Admitted with altered mental status and acute hypercarbic respiratory failure requiring mechanical ventilation Febrile 102 Sedated on propofol on exam this morning propofol lowered, follows commands interactive, anxious affect, tolerated pressure support 10/5 but then became tachypneic and desaturated , S1-S2 tacky, regular, soft nontender abdomen, few scattered rhonchi.  CT angiogram chest shows extensive multifocal groundglass infiltrates bilateral, 4.2 cm ascending thoracic aortic aneurysm. Labs show hyponatremia, slight high BNP, mild leukocytosis, ABG shows acute respiratory acidosis.  Impression/plan Acute hypercarbic respiratory failure -likely COPD exacerbation in the setting of community-acquired pneumonia/aspiration. Infiltrates noted on CT likely infectious rather than inflammatory, she also has fever and mild leukocytosis  -Obtain respiratory culture, continue cefepime vancomycin -Add Pulmicort and duo nebs -With history of C. difficile would limit antibiotic duration  Severe anxiety/chronic pain on oxycodone/sedation needs for mechanical ventilation -Using propofol for goal RASS 0 to -1 -Add fentanyl as needed, resume home doses of oxycodone and Xanax  Bedside echo demonstrated low EF, await formal echo , trying gentle diuresis for high BNP  Husband updated at bedside  The patient is critically ill with multiple organ systems failure and requires high complexity decision making for assessment and support, frequent evaluation and titration of therapies, application of advanced monitoring technologies and extensive interpretation of multiple databases. Critical Care Time devoted to patient care services described in this note independent of APP/resident  time is 35 minutes.   Comer Locket Vassie Loll MD

## 2021-02-17 NOTE — Progress Notes (Signed)
Last resulted ABG did not cross over POC. RN submitting edit sheet.   Results 7.31/70/125/34

## 2021-02-17 NOTE — Progress Notes (Signed)
Sputum sample sent to lab per MD order 

## 2021-02-17 NOTE — Progress Notes (Signed)
eLink Physician-Brief Progress Note Patient Name: Dawn Beasley DOB: September 26, 1951 MRN: [id number]   Date of Service  02/17/2021  HPI/Events of Note  Came with foley from ED, need order for it  eICU Interventions  ordered     Intervention Category Minor Interventions: Other:  [name] 02/17/2021, 6:29 AM

## 2021-02-17 NOTE — Progress Notes (Signed)
Pharmacy Antibiotic Note  Dawn Beasley is a 70 y.o. female admitted on 02/16/2021 with AMS and concern for sepsis.  Pharmacy has been consulted for vancomycin and cefepime dosing.  Plan: Vancomycin 1000mg  IV Q24H. Goal AUC 400-550.  Expected AUC 510.  SCr used 0.8. Cefepime 2g IV Q12H.  Height: 5\' 2"  (157.5 cm) Weight: 55.4 kg (122 lb 2.2 oz) IBW/kg (Calculated) : 50.1  Temp (24hrs), Avg:102 F (38.9 C), Min:100.4 F (38 C), Max:102.6 F (39.2 C)  Recent Labs  Lab 02/16/21 2212 02/16/21 2213  WBC 12.9*  --   CREATININE 0.76  --   LATICACIDVEN  --  2.2*    Estimated Creatinine Clearance: 54 mL/min (by C-G formula based on SCr of 0.76 mg/dL).    Allergies  Allergen Reactions  . Celebrex [Celecoxib] Swelling    Throat swelling  . Sulfa Antibiotics Swelling    Throat swelling    Thank you for allowing pharmacy to be a part of this patient's care.  2213, PharmD, BCPS  02/17/2021 4:45 AM

## 2021-02-18 ENCOUNTER — Inpatient Hospital Stay (HOSPITAL_COMMUNITY): Payer: PPO

## 2021-02-18 DIAGNOSIS — I5030 Unspecified diastolic (congestive) heart failure: Secondary | ICD-10-CM | POA: Diagnosis not present

## 2021-02-18 DIAGNOSIS — J9602 Acute respiratory failure with hypercapnia: Secondary | ICD-10-CM | POA: Diagnosis not present

## 2021-02-18 DIAGNOSIS — J9601 Acute respiratory failure with hypoxia: Secondary | ICD-10-CM | POA: Diagnosis not present

## 2021-02-18 LAB — BASIC METABOLIC PANEL
Anion gap: 13 (ref 5–15)
BUN: 23 mg/dL (ref 8–23)
CO2: 27 mmol/L (ref 22–32)
Calcium: 9.1 mg/dL (ref 8.9–10.3)
Chloride: 91 mmol/L — ABNORMAL LOW (ref 98–111)
Creatinine, Ser: 0.98 mg/dL (ref 0.44–1.00)
GFR, Estimated: >60 mL/min (ref >60)
Glucose, Bld: 92 mg/dL (ref 70–99)
Potassium: 3.7 mmol/L (ref 3.5–5.1)
Sodium: 131 mmol/L — ABNORMAL LOW (ref 135–145)

## 2021-02-18 LAB — ECHOCARDIOGRAM COMPLETE
Area-P 1/2: 5.84 cm2
Calc EF: 61.4 %
Height: 62 in
S' Lateral: 2.4 cm
Single Plane A2C EF: 61.4 %
Single Plane A4C EF: 60.2 %
Weight: 1876.56 oz

## 2021-02-18 LAB — RESPIRATORY PANEL BY PCR

## 2021-02-18 LAB — URINE CULTURE: Culture: NO GROWTH

## 2021-02-18 LAB — GLUCOSE, CAPILLARY
Glucose-Capillary: 104 mg/dL — ABNORMAL HIGH (ref 70–99)
Glucose-Capillary: 107 mg/dL — ABNORMAL HIGH (ref 70–99)
Glucose-Capillary: 119 mg/dL — ABNORMAL HIGH (ref 70–99)
Glucose-Capillary: 128 mg/dL — ABNORMAL HIGH (ref 70–99)
Glucose-Capillary: 138 mg/dL — ABNORMAL HIGH (ref 70–99)

## 2021-02-18 LAB — POCT I-STAT 7, (LYTES, BLD GAS, ICA,H+H)
Acid-Base Excess: 6 mmol/L — ABNORMAL HIGH (ref 0.0–2.0)
Bicarbonate: 34.5 mmol/L — ABNORMAL HIGH (ref 20.0–28.0)
Calcium, Ion: 1.24 mmol/L (ref 1.15–1.40)
HCT: 45 % (ref 36.0–46.0)
Hemoglobin: 15.3 g/dL — ABNORMAL HIGH (ref 12.0–15.0)
O2 Saturation: 98 %
Patient temperature: 39.2
Potassium: 4.8 mmol/L (ref 3.5–5.1)
Sodium: 130 mmol/L — ABNORMAL LOW (ref 135–145)
TCO2: 36 mmol/L — ABNORMAL HIGH (ref 22–32)
pCO2 arterial: 70.3 mmHg (ref 32.0–48.0)
pH, Arterial: 7.31 — ABNORMAL LOW (ref 7.350–7.450)
pO2, Arterial: 125 mmHg — ABNORMAL HIGH (ref 83.0–108.0)

## 2021-02-18 LAB — CBC
HCT: 42.4 % (ref 36.0–46.0)
Hemoglobin: 14.4 g/dL (ref 12.0–15.0)
MCH: 36 pg — ABNORMAL HIGH (ref 26.0–34.0)
MCHC: 34 g/dL (ref 30.0–36.0)
MCV: 106 fL — ABNORMAL HIGH (ref 80.0–100.0)
Platelets: 180 10*3/uL (ref 150–400)
RBC: 4 MIL/uL (ref 3.87–5.11)
RDW: 13.2 % (ref 11.5–15.5)
WBC: 13.1 10*3/uL — ABNORMAL HIGH (ref 4.0–10.5)
nRBC: 0 % (ref 0.0–0.2)

## 2021-02-18 MED ORDER — HYDRALAZINE HCL 20 MG/ML IJ SOLN
10.0000 mg | INTRAMUSCULAR | Status: DC | PRN
  Administered 2021-02-19 – 2021-02-20 (×3): 10 mg via INTRAVENOUS
  Filled 2021-02-18 (×3): qty 1

## 2021-02-18 MED ORDER — ACETAMINOPHEN 325 MG PO TABS
650.0000 mg | ORAL_TABLET | ORAL | Status: DC | PRN
  Administered 2021-02-18 – 2021-02-20 (×4): 650 mg via ORAL
  Filled 2021-02-18 (×4): qty 2

## 2021-02-18 MED ORDER — OXYCODONE HCL 5 MG PO TABS
5.0000 mg | ORAL_TABLET | Freq: Four times a day (QID) | ORAL | Status: DC | PRN
  Administered 2021-02-18 – 2021-02-19 (×3): 5 mg via ORAL
  Filled 2021-02-18 (×3): qty 1

## 2021-02-18 MED ORDER — POLYETHYLENE GLYCOL 3350 17 G PO PACK: 17.0000 g | PACK | Freq: Every day | ORAL | Status: DC | PRN

## 2021-02-18 MED ORDER — DOCUSATE SODIUM 100 MG PO CAPS: 100.0000 mg | ORAL_CAPSULE | Freq: Two times a day (BID) | ORAL | Status: DC | PRN

## 2021-02-18 MED ORDER — SODIUM CHLORIDE 0.9 % IV SOLN
2.0000 g | INTRAVENOUS | Status: DC
  Administered 2021-02-18 – 2021-02-19 (×2): 2 g via INTRAVENOUS
  Filled 2021-02-18 (×2): qty 20

## 2021-02-18 MED ORDER — OXYCODONE HCL 5 MG PO TABS
5.0000 mg | ORAL_TABLET | Freq: Four times a day (QID) | ORAL | Status: DC | PRN
  Administered 2021-02-18 (×2): 5 mg
  Filled 2021-02-18 (×2): qty 1

## 2021-02-18 MED ORDER — ALPRAZOLAM 0.5 MG PO TABS: 0.5000 mg | ORAL_TABLET | Freq: Two times a day (BID) | ORAL | Status: DC | PRN

## 2021-02-18 MED ORDER — METHYLPREDNISOLONE SODIUM SUCC 125 MG IJ SOLR
60.0000 mg | Freq: Two times a day (BID) | INTRAMUSCULAR | Status: DC
  Administered 2021-02-18 (×2): 60 mg via INTRAVENOUS
  Filled 2021-02-18 (×2): qty 2

## 2021-02-18 MED ORDER — LOSARTAN POTASSIUM 50 MG PO TABS
100.0000 mg | ORAL_TABLET | Freq: Every day | ORAL | Status: DC
  Administered 2021-02-19 – 2021-02-20 (×3): 100 mg via ORAL
  Filled 2021-02-18 (×2): qty 2

## 2021-02-18 MED ORDER — DOCUSATE SODIUM 50 MG/5ML PO LIQD: 100.0000 mg | Freq: Two times a day (BID) | ORAL | Status: DC | PRN

## 2021-02-18 MED ORDER — ORAL CARE MOUTH RINSE
15.0000 mL | Freq: Two times a day (BID) | OROMUCOSAL | Status: DC
  Administered 2021-02-18 – 2021-02-20 (×4): 15 mL via OROMUCOSAL

## 2021-02-18 MED ORDER — ALPRAZOLAM 0.5 MG PO TABS
0.5000 mg | ORAL_TABLET | Freq: Two times a day (BID) | ORAL | Status: DC | PRN
  Administered 2021-02-18 – 2021-02-19 (×3): 0.5 mg via ORAL
  Filled 2021-02-18 (×3): qty 1

## 2021-02-18 MED ORDER — NICOTINE 14 MG/24HR TD PT24
14.0000 mg | MEDICATED_PATCH | Freq: Every day | TRANSDERMAL | Status: DC
  Administered 2021-02-18 – 2021-02-20 (×3): 14 mg via TRANSDERMAL
  Filled 2021-02-18 (×3): qty 1

## 2021-02-18 NOTE — Progress Notes (Signed)
NAME:  Dawn Beasley, MRN:  [id number], DOB:  10-01-1951, LOS: 1 ADMISSION DATE:  02/16/2021, CONSULTATION DATE:  02/16/21 REFERRING MD:  Dr. Girard Cooter , CHIEF COMPLAINT:  AMS  History of Present Illness:  Dawn Beasley is a 70 year old female to presented to the [facility] on 02/16/21 via EMS after her husband noted slurred speech at home. She was emergently intubated in the ED for AMS and hypoxia. On 4/3 she was noted to be septic with Lactate 2.2. CTA chest with extensive multifocal groundglass infiltrates R>L.   Pertinent  Medical History  AAA, HTN, Chronic back pain s/p lumbar spine stimulator, panic disorder, depression   Significant Hospital Events: Including procedures, antibiotic start and stop dates in addition to other pertinent events       02/17/2021 patient emergently intubated in ED  Vancomycin 4/3 >> Cefepime 4/4 >>   Interim History / Subjective:  Patient became increasingly agitated during weaning trial. Indicates that she is having increased back pain. Otherwise following commands well and indicates that she would like to have ET tube removed.   Objective   Blood pressure (!) 114/56, pulse (!) 103, temperature 97.8 F (36.6 C), temperature source Axillary, resp. rate (!) 23, height 5\' 2"  (1.575 m), weight 53.2 kg, SpO2 100 %.    Vent Mode: PSV;CPAP FiO2 (%):  [30 %-40 %] 30 % Set Rate:  [22 bmp] 22 bmp Vt Set:  [400 mL] 400 mL PEEP:  [5 cmH20] 5 cmH20 Pressure Support:  [5 cmH20] 5 cmH20 Plateau Pressure:  [21 cmH20-30 cmH20] 22 cmH20   Intake/Output Summary (Last 24 hours) at 02/18/2021 0932 Last data filed at 02/18/2021 0700 Gross per 24 hour  Intake 975.15 ml  Output 1880 ml  Net -904.85 ml   Filed Weights   02/17/21 0125 02/17/21 0417 02/18/21 0500  Weight: 48 kg 55.4 kg 53.2 kg    Physical Exam Vitals reviewed.  Constitutional:      Comments: Patient on ventilator, appears uncomfortable lying in bed  Cardiovascular:     Rate and Rhythm: Regular rhythm.  Tachycardia present.     Heart sounds: No murmur heard. No friction rub.  Pulmonary:     Comments: Diffuse wheezing heard anteriorly, no other aberrant breath sounds noted Breathing rapidly (30-35) over vent  Neurological:     Comments: Moving all extremities, following commands, answering questions appropriately       Labs/imaging that I havepersonally reviewed  (right click and "Reselect all SmartList Selections" daily)  Lactic acid 2.2>1.8  WBC 13.1  Bicarb 31>27  Resolved Hospital Problem list     Assessment & Plan:  Altered mental status Acute hypercarbic respiratory failure requiring mechanical ventilation Sepsis COPD Currently receiving Vancomycin and cefepime. Fever resolved. Remains wheezy on exam but respiratory and mental status improved from previous. Has grown serratia in the past, tracheal aspirate gram stain and culture pending at this time. Becomes agitated when sedation turned down, unclear to what degree this is caused by chronic back pain vs. Irritation from ET tube.  - Extubate to [place] vs BiPAP - D/c vanc as MRSA PCR was neg - Continue cefepime - Continue pulmicort and duo-nebs - Add solu-medrol 60mg  BID - Follow-up tracheal aspirate culture  Chronic back pain s/p lumbar spinal stimulator Chronic severe anxiety Likely contributing to her agitation and tachypnea.  - Continue home oxycodone and Xanax - Can spot dose fentanyl as needed for pain   Best practice (right click and "Reselect all SmartList Selections" daily)  Diet:  NPO Pain/Anxiety/Delirium protocol (if indicated): Yes (RASS goal +1) VAP protocol (if indicated): Not indicated DVT prophylaxis: LMWH GI prophylaxis: PPI Glucose control:  SSI No Central venous access:  N/A Arterial line:  N/A Foley:  Yes, and it is no longer needed Mobility:  bed rest  PT consulted: N/A Last date of multidisciplinary goals of care discussion [none to date] Code Status:  full code Disposition: ICU,  hopeful for transfer to floor soon if respiratory/mental status continue to improve   Labs   CBC: Recent Labs  Lab 02/16/21 2212 02/16/21 2250 02/17/21 0438 02/17/21 0505 02/18/21 0038  WBC 12.9*  --   --  10.8* 13.1*  NEUTROABS 11.6*  --   --   --   --   HGB 14.5 15.6* 15.3* 12.6 14.4  HCT 44.7 46.0 45.0 39.1 42.4  MCV 109.6*  --   --  112.4* 106.0*  PLT 170  --   --  164 180    Basic Metabolic Panel: Recent Labs  Lab 02/16/21 2212 02/16/21 2250 02/17/21 0438 02/17/21 0505 02/18/21 0038  NA 130* 127* 130* 130* 131*  K 5.4* 4.9 4.8 4.5 3.7  CL 90*  --   --  88* 91*  CO2 30  --   --  31 27  GLUCOSE 150*  --   --  115* 92  BUN 16  --   --  16 23  CREATININE 0.76  --   --  0.88 0.98  CALCIUM 9.2  --   --  9.0 9.1  MG  --   --   --  1.9  --   PHOS  --   --   --  3.3  --    GFR: Estimated Creatinine Clearance: 44.1 mL/min (by C-G formula based on SCr of 0.98 mg/dL). Recent Labs  Lab 02/16/21 2212 02/16/21 2213 02/17/21 0505 02/17/21 0619 02/18/21 0038  WBC 12.9*  --  10.8*  --  13.1*  LATICACIDVEN  --  2.2*  --  1.8  --     Liver Function Tests: Recent Labs  Lab 02/16/21 2212  AST 24  ALT 14  ALKPHOS 77  BILITOT 0.9  PROT 7.2  ALBUMIN 3.5   No results for input(s): LIPASE, AMYLASE in the last 168 hours. Recent Labs  Lab 02/16/21 2213  AMMONIA 57*    ABG    Component Value Date/Time   PHART 7.310 (L) 02/17/2021 0438   PCO2ART 70.3 (HH) 02/17/2021 0438   PO2ART 125 (H) 02/17/2021 0438   HCO3 34.5 (H) 02/17/2021 0438   TCO2 36 (H) 02/17/2021 0438   ACIDBASEDEF 4.0 (H) 02/12/2020 0511   O2SAT 98.0 02/17/2021 0438     Coagulation Profile: Recent Labs  Lab 02/16/21 2320  INR 1.0    Cardiac Enzymes: No results for input(s): CKTOTAL, CKMB, CKMBINDEX, TROPONINI in the last 168 hours.  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   Surgical History:   Past Surgical History:  Procedure Laterality Date  . APPENDECTOMY    . BACK SURGERY    .  CARPAL TUNNEL RELEASE    . LUMBAR LAMINECTOMY/DECOMPRESSION MICRODISCECTOMY N/A 04/12/2014   Procedure: MICRO LUMBAR DECOMPRESSION LUMBAR THREE TO FOUR,LUMBAR  FOUR TO FIVE;  Surgeon: Javier Docker, MD;  Location: [facility];  Service: Orthopedics;  Laterality: N/A;  . MAXOFACIAL SURGERY    . SPINAL CORD STIMULATOR INSERTION N/A 12/06/2015   Procedure: LUMBAR SPINAL CORD STIMULATOR INSERTION;  Surgeon: Venita Lick, MD;  Location: [facility] OR;  Service: Orthopedics;  Laterality: N/A;     Social History:   reports that she quit smoking about a year ago. She has a 40.00 pack-year smoking history. She has never used smokeless tobacco. She reports current alcohol use. She reports current drug use. Drug: Oxycodone.   Family History:  Her family history includes Cancer in her sister.   Allergies Allergies  Allergen Reactions  . Celebrex [Celecoxib] Swelling    Throat swelling  . Sulfa Antibiotics Swelling    Throat swelling     Home Medications  Prior to Admission medications   Medication Sig Start Date End Date Taking? Authorizing Provider  acetaminophen (TYLENOL) 500 MG tablet Take 500 mg by mouth every 6 (six) hours as needed for moderate pain.   Yes [provider]  albuterol (VENTOLIN HFA) 108 (90 Base) MCG/ACT inhaler Inhale 2 puffs into the lungs every 4 (four) hours as needed for shortness of breath or wheezing. 01/14/21  Yes [provider]  ALPRAZolam (XANAX) 0.5 MG tablet Take 1 tablet (0.5 mg total) by  mouth 2 (two) times daily as needed for anxiety. 03/12/20  Yes Angiulli, Mcarthur Rossetti, PA-C  gabapentin (NEURONTIN) 300 MG capsule Take 600 mg by mouth 3 (three) times daily. 02/12/21  Yes [provider]  ibuprofen (ADVIL) 800 MG tablet Take 800 mg by mouth every 8 (eight) hours as needed for moderate pain.   Yes [provider]  losartan (COZAAR) 100 MG tablet Take 100 mg by mouth daily. 03/25/20  Yes [provider]  oxyCODONE-acetaminophen (PERCOCET) 10-325 MG tablet Take 1 tablet by mouth 4 (four) times daily as needed for pain. 02/08/21  Yes [provider]  traZODone (DESYREL) 100 MG tablet Take 100-200 mg by mouth at bedtime as needed for sleep. 01/02/21  Yes [provider]  acetaminophen (TYLENOL) 325 MG tablet Take 2 tablets (650 mg total) by mouth every 6 (six) hours as needed for mild pain (or Fever >/= 101). Patient not taking: Reported on 02/17/2021 03/02/20   Drema Dallas, MD  fidaxomicin (DIFICID) 200 MG TABS tablet Take 1 tablet (200 mg total) by mouth 2 (two) times daily. Patient not taking: Reported on 02/17/2021 05/11/20   Judyann Munson, MD  mirtazapine (REMERON) 30 MG tablet Take 1 tablet (30 mg total) by mouth at bedtime. Patient not taking: Reported on 02/17/2021 06/20/20   Judyann Munson, MD  nicotine (NICODERM CQ - DOSED IN MG/24 HOURS) 21 mg/24hr patch 21 mg patch daily x1 week then 14 mg patch daily x3 weeks then 7 mg patch daily x3 weeks and stop Patient not taking: Reported on 02/17/2021 03/12/20   Charlton Amor, PA-C     Critical care time: 35 min

## 2021-02-18 NOTE — Progress Notes (Signed)
[facility] Physician-Brief Progress Note Patient Name: Dawn Beasley DOB: 12-Apr-1951 MRN: [id number]   Date of Service  02/18/2021  HPI/Events of Note  Patient with uncontrolled hypertension, her home Losartan was not restarted.  eICU Interventions  Losartan 100 mg po daily resumed with first dose tonight, PRN Hydralazine ordered for SBP > 170 mmHg.        Thomasene Lot Malikhi  02/18/2021, 11:47 PM

## 2021-02-18 NOTE — Progress Notes (Signed)
  Echocardiogram Echocardiogram Transesophageal has been performed.  Janalyn Harder 02/18/2021, 10:27 AM

## 2021-02-18 NOTE — Procedures (Signed)
Extubation Procedure Note  Patient Details:   Name: Dawn Beasley DOB: May 26, 1951 MRN: [id number]   Airway Documentation:    Vent end date: 02/18/21 Vent end time: 0900   Evaluation  O2 sats: stable throughout Complications: No apparent complications Patient did tolerate procedure well. Bilateral Breath Sounds: Expiratory wheezes   Yes  [name] 02/18/2021, 9:06 AM

## 2021-02-19 DIAGNOSIS — J9621 Acute and chronic respiratory failure with hypoxia: Secondary | ICD-10-CM

## 2021-02-19 DIAGNOSIS — J189 Pneumonia, unspecified organism: Secondary | ICD-10-CM | POA: Diagnosis present

## 2021-02-19 DIAGNOSIS — J441 Chronic obstructive pulmonary disease with (acute) exacerbation: Secondary | ICD-10-CM | POA: Diagnosis present

## 2021-02-19 DIAGNOSIS — I10 Essential (primary) hypertension: Secondary | ICD-10-CM

## 2021-02-19 LAB — BASIC METABOLIC PANEL
Anion gap: 12 (ref 5–15)
BUN: 29 mg/dL — ABNORMAL HIGH (ref 8–23)
CO2: 28 mmol/L (ref 22–32)
Calcium: 9.5 mg/dL (ref 8.9–10.3)
Chloride: 95 mmol/L — ABNORMAL LOW (ref 98–111)
Creatinine, Ser: 0.61 mg/dL (ref 0.44–1.00)
GFR, Estimated: >60 mL/min (ref >60)
Glucose, Bld: 111 mg/dL — ABNORMAL HIGH (ref 70–99)
Potassium: 3.7 mmol/L (ref 3.5–5.1)
Sodium: 135 mmol/L (ref 135–145)

## 2021-02-19 LAB — BRAIN NATRIURETIC PEPTIDE: B Natriuretic Peptide: 397.7 pg/mL — ABNORMAL HIGH (ref 0.0–100.0)

## 2021-02-19 LAB — CBC
HCT: 46.4 % — ABNORMAL HIGH (ref 36.0–46.0)
Hemoglobin: 15.9 g/dL — ABNORMAL HIGH (ref 12.0–15.0)
MCH: 35.3 pg — ABNORMAL HIGH (ref 26.0–34.0)
MCHC: 34.3 g/dL (ref 30.0–36.0)
MCV: 103.1 fL — ABNORMAL HIGH (ref 80.0–100.0)
Platelets: 250 10*3/uL (ref 150–400)
RBC: 4.5 MIL/uL (ref 3.87–5.11)
RDW: 13 % (ref 11.5–15.5)
WBC: 11.2 10*3/uL — ABNORMAL HIGH (ref 4.0–10.5)
nRBC: 0 % (ref 0.0–0.2)

## 2021-02-19 LAB — CULTURE, RESPIRATORY W GRAM STAIN

## 2021-02-19 MED ORDER — IPRATROPIUM-ALBUTEROL 0.5-2.5 (3) MG/3ML IN SOLN
3.0000 mL | Freq: Three times a day (TID) | RESPIRATORY_TRACT | Status: DC
  Administered 2021-02-19 – 2021-02-20 (×2): 3 mL via RESPIRATORY_TRACT
  Filled 2021-02-19 (×2): qty 3

## 2021-02-19 MED ORDER — FUROSEMIDE 40 MG PO TABS
40.0000 mg | ORAL_TABLET | Freq: Every day | ORAL | Status: AC
  Administered 2021-02-19 – 2021-02-20 (×2): 40 mg via ORAL
  Filled 2021-02-19 (×2): qty 1

## 2021-02-19 MED ORDER — OXYCODONE HCL 5 MG PO TABS
5.0000 mg | ORAL_TABLET | Freq: Four times a day (QID) | ORAL | Status: DC | PRN
  Administered 2021-02-19: 10 mg via ORAL
  Administered 2021-02-19: 5 mg via ORAL
  Administered 2021-02-20 (×2): 10 mg via ORAL
  Filled 2021-02-19 (×4): qty 2

## 2021-02-19 MED ORDER — ALPRAZOLAM 0.5 MG PO TABS
0.5000 mg | ORAL_TABLET | Freq: Three times a day (TID) | ORAL | Status: DC | PRN
  Administered 2021-02-19 – 2021-02-20 (×2): 0.5 mg via ORAL
  Filled 2021-02-19 (×2): qty 1

## 2021-02-19 MED ORDER — METOPROLOL TARTRATE 25 MG PO TABS
25.0000 mg | ORAL_TABLET | Freq: Two times a day (BID) | ORAL | Status: DC
  Administered 2021-02-19 (×2): 25 mg via ORAL
  Filled 2021-02-19 (×2): qty 1

## 2021-02-19 MED ORDER — PREDNISONE 20 MG PO TABS
40.0000 mg | ORAL_TABLET | Freq: Every day | ORAL | Status: DC
  Administered 2021-02-20: 40 mg via ORAL
  Filled 2021-02-19: qty 2

## 2021-02-19 MED ORDER — METHYLPREDNISOLONE SODIUM SUCC 125 MG IJ SOLR
60.0000 mg | Freq: Two times a day (BID) | INTRAMUSCULAR | Status: DC
  Filled 2021-02-19 (×2): qty 2

## 2021-02-19 MED ORDER — METHYLPREDNISOLONE SODIUM SUCC 125 MG IJ SOLR
60.0000 mg | Freq: Two times a day (BID) | INTRAMUSCULAR | Status: AC
  Administered 2021-02-19 (×2): 60 mg via INTRAVENOUS
  Filled 2021-02-19 (×2): qty 2

## 2021-02-19 NOTE — Progress Notes (Signed)
SATURATION QUALIFICATIONS: (This note is used to comply with regulatory documentation for home oxygen)  Patient Saturations on Room Air at Rest = 90%  Patient Saturations on Room Air while Ambulating = 85%  Patient Saturations on 3 Liters of oxygen while Ambulating = 90%  Please briefly explain why patient needs home oxygen:Needs supplemental O2 to maintain adequate oxygenation with activity.   Georga Hacking [facility] PT Acute Rehabilitation Services Pager [phone] Office [phone]

## 2021-02-19 NOTE — Evaluation (Signed)
Occupational Therapy Evaluation Patient Details Name: Dawn Beasley MRN: [id number] DOB: [birth year] Today's Date: 02/19/2021    History of Present Illness Pt adm 4/2 with acute on chronic respiratory failure, acute decompensated heart failure, and metabolic encephalopathy. Pt intubated 4/2-4/4. PMH - chronic back pain, AAA, htn, depression, anxiety, copd.   Clinical Impression   Pt PTA: Pt independent and living with spouse, wears reading glasses. Pt currently, limited by decreased strength, decreased activity tolerance and decreased ability to care for self. Pt sitting EOB for most of session, breathing heavily and too fatigued to stand. Pt supervisionA to minA overall for ADL and minguardA for bed mobility. Pt scooting to EOB with bridging technique  Pt denying ability to engage in further mobility as pt too fatigued from transferring to [facility] prior to OTR arrival. Pt with O2 >90% on 2L O2, pursed lip breathing. Pt would benefit from continued OT skilled services.  OT following acutely.    Follow Up Recommendations  Home health OT;Supervision - Intermittent    Equipment Recommendations  None recommended by OT    Recommendations for Other Services       Precautions / Restrictions Precautions Precautions: Fall Restrictions Weight Bearing Restrictions: No      Mobility Bed Mobility Overal bed mobility: Needs Assistance Bed Mobility: Supine to Sit     Supine to sit: Supervision;HOB elevated     General bed mobility comments: Increased time, use of rails    Transfers                 General transfer comment: refused    Balance Overall balance assessment: Needs assistance Sitting-balance support: No upper extremity supported;Feet supported Sitting balance-Leahy Scale: Fair Sitting balance - Comments: seated for grooming                                   ADL either performed or assessed with clinical judgement   ADL Overall ADL's : Needs  assistance/impaired Eating/Feeding: Set up;Sitting   Grooming: Set up;Sitting Grooming Details (indicate cue type and reason): Pt requiring rest after going to use BSC so pt did not have the energy to get up again Upper Body Bathing: Set up;Sitting   Lower Body Bathing: Minimal assistance;Cueing for safety;Sitting/lateral leans;Sit to/from stand   Upper Body Dressing : Set up;Sitting   Lower Body Dressing: Minimal assistance;Sitting/lateral leans;Sit to/from stand   Toilet Transfer: Min guard   Toileting- Architect and Hygiene: Min guard;Sitting/lateral lean;Sit to/from stand       Functional mobility during ADLs: Min guard;Cueing for safety General ADL Comments: Pt limited by decreased strength, decreased activity tolerance and decreased ability to care for self. Pt sitting EOB for most of session, breathing heavily and too fatigued to stand. pt scooting to EOB with bridging technique. O2 >90% on 2L O2, pursed lip breathing.     Vision Baseline Vision/History: Wears glasses Wears Glasses: At all times Patient Visual Report: No change from baseline Vision Assessment?: No apparent visual deficits     Perception     Praxis      Pertinent Vitals/Pain Pain Assessment: 0-10 Pain Score: 3  Pain Location: back Pain Descriptors / Indicators: Aching Pain Intervention(s): Monitored during session;Limited activity within patient's tolerance     Hand Dominance Right   Extremity/Trunk Assessment Upper Extremity Assessment Upper Extremity Assessment: Generalized weakness   Lower Extremity Assessment Lower Extremity Assessment: Generalized weakness   Cervical / Trunk Assessment  Cervical / Trunk Assessment: Normal   Communication Communication Communication: No difficulties   Cognition Arousal/Alertness: Awake/alert Behavior During Therapy: WFL for tasks assessed/performed Overall Cognitive Status: Within Functional Limits for tasks assessed                                      General Comments  2L O2 >90% throughout exertion. Energy conservation techniques education has begun.    Exercises     Shoulder Instructions      Home Living Family/patient expects to be discharged to:: Private residence Living Arrangements: Spouse/significant other Available Help at Discharge: Family;Friend(s);Available 24 hours/day Type of Home: House Home Access: Stairs to enter [employer] of Steps: 2 Entrance Stairs-Rails: Right Home Layout: Two level;Able to live on main level with bedroom/bathroom Alternate Level Stairs-Number of Steps: flight Alternate Level Stairs-Rails: Left Bathroom Shower/Tub: Tub/shower unit   Bathroom Toilet: Standard Bathroom Accessibility: Yes   Home Equipment: Cane - single point;Walker - 4 wheels          Prior Functioning/Environment Level of Independence: Independent                 OT Problem List: Decreased strength;Decreased activity tolerance;Decreased safety awareness;Cardiopulmonary status limiting activity;Increased edema      OT Treatment/Interventions: Self-care/ADL training;Therapeutic exercise;Energy conservation;DME and/or AE instruction;Therapeutic activities;Patient/family education;Balance training    OT Goals(Current goals can be found in the care plan section) Acute Rehab OT Goals Patient Stated Goal: return home OT Goal Formulation: With patient Time For Goal Achievement: 03/05/21 Potential to Achieve Goals: Good ADL Goals Pt Will Perform Grooming: with modified independence;standing Pt Will Perform Lower Body Dressing: with modified independence;sit to/from stand Additional ADL Goal #1: pt will state 3 energy conservation strategies in order to increase activity tolerance Additional ADL Goal #2: pt will perform x6 mins of OOB ADL with supervisionA with O2 >90% in order to increase activity tolerance an encourage rest breaks  OT Frequency: Min 2X/week   Barriers  to D/C:            Co-evaluation              AM-PAC OT "6 Clicks" Daily Activity     Outcome Measure Help from another person eating meals?: None Help from another person taking care of personal grooming?: A Little Help from another person toileting, which includes using toliet, bedpan, or urinal?: A Little Help from another person bathing (including washing, rinsing, drying)?: A Little Help from another person to put on and taking off regular upper body clothing?: None Help from another person to put on and taking off regular lower body clothing?: A Little 6 Click Score: 20   End of Session Equipment Utilized During Treatment: Oxygen Nurse Communication: Mobility status  Activity Tolerance: Patient limited by fatigue Patient left: in bed;with call bell/phone within reach;with bed alarm set  OT Visit Diagnosis: Unsteadiness on feet (R26.81);Muscle weakness (generalized) (M62.81)                Time: 4970-2637 OT Time Calculation (min): 18 min Charges:  OT General Charges $OT Visit: 1 Visit OT Evaluation $OT Eval Moderate Complexity: 1 Mod  Flora Lipps, OTR/L Acute Rehabilitation Services Pager: [phone] Office: [phone]   Fallon Howerter  C 02/19/2021, 5:40 PM

## 2021-02-19 NOTE — Evaluation (Signed)
Physical Therapy Evaluation Patient Details Name: Dawn Beasley MRN: [id number] DOB: August 07, 1951 Today's Date: 02/19/2021   History of Present Illness  Pt adm 4/2 with acute on chronic respiratory failure, acute decompensated heart failure, and metabolic encephalopathy. Pt intubated 4/2-4/4. PMH - chronic back pain, AAA, htn, depression, anxiety, copd.  Clinical Impression  Pt presents to PT with decr mobility and activity tolerance. Expect she will make good progress and be able to return home with family support. Is requiring supplemental O2 with activity as she dropped to 85% on RA with amb.     Follow Up Recommendations Home health PT;Supervision for mobility/OOB    Equipment Recommendations  None recommended by PT    Recommendations for Other Services       Precautions / Restrictions Precautions Precautions: Fall      Mobility  Bed Mobility Overal bed mobility: Needs Assistance Bed Mobility: Supine to Sit     Supine to sit: Supervision;HOB elevated     General bed mobility comments: Incr time and assist for safety and lines    Transfers Overall transfer level: Needs assistance Equipment used: 4-wheeled walker Transfers: Sit to/from Stand Sit to Stand: Min guard         General transfer comment: Assist for safety and lines  Ambulation/Gait Ambulation/Gait assistance: Min guard Gait Distance (Feet): 75 Feet Assistive device: 4-wheeled walker Gait Pattern/deviations: Step-through pattern;Decreased stride length Gait velocity: decr Gait velocity interpretation: <1.31 ft/sec, indicative of household ambulator General Gait Details: Assist for Industrial/product designer    Modified Rankin (Stroke Patients Only)       Balance Overall balance assessment: Needs assistance Sitting-balance support: No upper extremity supported;Feet supported Sitting balance-Leahy Scale: Fair     Standing balance support: No upper  extremity supported;During functional activity Standing balance-Leahy Scale: Fair                               Pertinent Vitals/Pain Pain Assessment: 0-10 Pain Score: 7  Pain Location: back Pain Descriptors / Indicators: Aching Pain Intervention(s): Limited activity within patient's tolerance;Repositioned    Home Living Family/patient expects to be discharged to:: Private residence Living Arrangements: Spouse/significant other Available Help at Discharge: Family;Friend(s);Available 24 hours/day Type of Home: House Home Access: Stairs to enter Entrance Stairs-Rails: Right Entrance Stairs-Number of Steps: 2 Home Layout: Two level;Able to live on main level with bedroom/bathroom Home Equipment: Gil[place] [employer] - single point;Walker - 4 wheels      Prior Function Level of Independence: Independent               Hand Dominance   Dominant Hand: Right    Extremity/Trunk Assessment   Upper Extremity Assessment Upper Extremity Assessment: Defer to OT evaluation    Lower Extremity Assessment Lower Extremity Assessment: Generalized weakness       Communication   Communication: No difficulties  Cognition Arousal/Alertness: Awake/alert Behavior During Therapy: WFL for tasks assessed/performed Overall Cognitive Status: Within Functional Limits for tasks assessed                                        General Comments General comments (skin integrity, edema, etc.): Removed O2 and pt 90% on RA at rest. Amb on RA 85%. Placed O2 at 3L for amb with SpO2 90%.  Exercises     Assessment/Plan    PT Assessment Patient needs continued PT services  PT Problem List Decreased strength;Decreased activity tolerance;Decreased mobility;Decreased balance       PT Treatment Interventions DME instruction;Gait training;Functional mobility training;Therapeutic activities;Therapeutic exercise;Balance training;Patient/family education    PT Goals (Current goals can  be found in the Care Plan section)  Acute Rehab PT Goals Patient Stated Goal: return home PT Goal Formulation: With patient/family Time For Goal Achievement: 03/05/21 Potential to Achieve Goals: Good    Frequency Min 3X/week   Barriers to discharge        Co-evaluation               AM-PAC PT "6 Clicks" Mobility  Outcome Measure Help needed turning from your back to your side while in a flat bed without using bedrails?: None Help needed moving from lying on your back to sitting on the side of a flat bed without using bedrails?: A Little Help needed moving to and from a bed to a chair (including a wheelchair)?: A Little Help needed standing up from a chair using your arms (e.g., wheelchair or bedside chair)?: A Little Help needed to walk in hospital room?: A Little Help needed climbing 3-5 steps with a railing? : A Little 6 Click Score: 19    End of Session Equipment Utilized During Treatment: Gait belt;Oxygen Activity Tolerance: Patient limited by fatigue Patient left: in chair;with call bell/phone within reach;with chair alarm set Nurse Communication: Mobility status;Other (comment) (SpO2 levels) PT Visit Diagnosis: Other abnormalities of gait and mobility (R26.89);Muscle weakness (generalized) (M62.81)    Time: 3329-5188 PT Time Calculation (min) (ACUTE ONLY): 35 min   Charges:   PT Evaluation $PT Eval Moderate Complexity: 1 Mod PT Treatments $Gait Training: 8-22 mins        [facility] PT Acute Rehabilitation Services Pager [phone] Office [phone]   Angelina Ok [facility] 02/19/2021, 12:33 PM

## 2021-02-19 NOTE — Progress Notes (Signed)
[facility]                                                                              Patient Demographics  Dawn Beasley, is a 70 y.o. female, DOB - 1951/05/05, JPE:[id number]  Admit date - 02/16/2021   Admitting Physician Deland Pretty, MD  Outpatient Primary MD for the patient is Cyndi Bender, PA-C  Outpatient specialists:   LOS - 2  days   Medical records reviewed and are as summarized below:    Chief Complaint  Patient presents with  . Altered Mental Status       Brief summary   Patient is a 70 year old female with history of chronic back pain, AAA, depression, hypertension, anxiety, nicotine use, presented to ED from home.  Patient's husband had noticed that she was slurring her speech while at dinner, also became less responsive.  EMS was called and patient was found to be hypoxic and obtunded.  She was placed on 100% nonrebreather.  In ED, she was emergently intubated and admitted to ICU.  She also received Narcan prior to arrival to ED. In ED, tachycardia 110, 10.1 2.3 F, respiratory rate 25 Patient was admitted with acute decompensated CHF, acute metabolic encephalopathy, waxing and waning mental status, acute encephalopathy. She was extubated on 02/18/2021.  She was transferred to [facility] service, assumed care on 4/5  Assessment & Plan    Principal Problem:   Acute on chronic respiratory failure with hypoxia and hypercapnia ([place]), community-acquired pneumonia -Likely due to acute COPD exacerbation, states she is not on any O2 at home.  Also takes oxycodone, Xanax, gabapentin  -Extubated on 4/4, overnight no acute issues, sats currently 96% on 4 L, wean O2 as tolerated -Taper IV Solu-Medrol, will start prednisone in a.m. -Continue IV Rocephin, scheduled nebs, Pulmicort -CTA chest showed no PE, extensive groundglass and tree-in-bud nodular densities, most pronounced in upper lobes and lung bases, throughout the lungs -COVID-19 negative,  respiratory virus panel negative, tracheal culture positive for haemophilus influenza -PT OT evaluation and home O2 evaluation prior to discharge.  Active Problems: Acute decompensated diastolic CHF -BNP elevated 542.6 at the time of admission, mildly elevated troponin likely due to demand ischemia -Patient received multiple doses of IV Lasix, 2D echo showed EF of 60 to 65%, normal LV function, no regional wall motion abnormalities. -BNP improving, 397.7, transition to oral Lasix 40 mg daily for 2 doses, will reassess  Chronic back pain  -Currently stable, cautious use of narcotics, patient takes Percocet at home    Essential hypertension -Uncontrolled, overnight BP elevated, resumed losartan, added metoprolol -Continue IV hydralazine as needed with parameters    Aspiration/dysphagia ([place]) -SLP evaluation done, recommended dysphagia 2 diet with thin liquids     Sacral decubitus ulcer, stage II ([place]) -Continue nursing care, not present on admission per RN documentation RN Pressure Injury Documentation: Pressure Injury 03/01/20 Sacrum Lower Stage 2 -  Partial thickness loss of dermis presenting as a shallow open injury with a red, pink wound bed without slough. quarter sized spot; top layer of skin missing and red (Active)  03/01/20 1010  Location: Sacrum  Location Orientation: Lower  Staging: Stage 2 -  Partial thickness loss of dermis presenting as a shallow open injury with a red, pink wound bed without slough.  Wound Description (Comments): quarter sized spot; top layer of skin missing and red  Present on Admission: No   acute metabolic encephalopathy -Likely due to #1, patient also takes gabapentin, Percocet, Xanax -Continue to hold gabapentin, no focal neurological deficits -Currently appears to be back at baseline, alert and oriented x3  Generalized debility -Transfer to the floor, PT OT evaluation, home O2 evaluation.  States she lives at home with her husband  Code  Status: Full code DVT Prophylaxis:  enoxaparin (LOVENOX) injection 40 mg Start: 02/17/21 1415 SCDs Start: 02/17/21 0113   Level of Care: Level of care: Telemetry Cardiac Family Communication: Discussed all imaging results, lab results, explained to the patient and patient's husband on the phone, Mr. Aroush [name], phone number [phone].   Disposition Plan:     Status is: Inpatient  Remains inpatient appropriate because:Inpatient level of care appropriate due to severity of illness   Dispo: The patient is from: Home              Anticipated d/c is to: Home              Patient currently is not medically stable to d/c.  Still on O2, 4 L at the time of my examination, weaning her down or off as tolerated   Difficult to place patient No      Time Spent in minutes 35 minutes  Procedures:  Mechanical ventilation, extubated on 4/4  Consultants:   Patient was admitted by CCM  Antimicrobials:   Anti-infectives (From admission, onward)   Start     Dose/Rate Route Frequency Ordered Stop   02/18/21 1800  cefTRIAXone (ROCEPHIN) 2 g in sodium chloride 0.9 % 100 mL IVPB        2 g 200 mL/hr over 30 Minutes Intravenous Every 24 hours 02/18/21 1414     02/18/21 0400  vancomycin (VANCOREADY) IVPB 1000 mg/200 mL  Status:  Discontinued        1,000 mg 200 mL/hr over 60 Minutes Intravenous Every 24 hours 02/17/21 0445 02/18/21 0900   02/17/21 0600  ceFEPIme (MAXIPIME) 2 g in sodium chloride 0.9 % 100 mL IVPB  Status:  Discontinued        2 g 200 mL/hr over 30 Minutes Intravenous Every 12 hours 02/17/21 0445 02/18/21 1414   02/17/21 0545  vancomycin (VANCOREADY) IVPB 1000 mg/200 mL        1,000 mg 200 mL/hr over 60 Minutes Intravenous  Once 02/17/21 0445 02/17/21 0611         Medications  Scheduled Meds: . budesonide (PULMICORT) nebulizer solution  0.5 mg Nebulization BID  . chlorhexidine gluconate (MEDLINE KIT)  15 mL Mouth Rinse BID  . Chlorhexidine Gluconate Cloth  6 each  Topical Daily  . enoxaparin (LOVENOX) injection  40 mg Subcutaneous Q24H  . furosemide  40 mg Oral Daily  . ipratropium-albuterol  3 mL Nebulization QID  . losartan  100 mg Oral Daily  . mouth rinse  15 mL Mouth Rinse BID  . methylPREDNISolone (SOLU-MEDROL) injection  60 mg Intravenous Q12H  . metoprolol tartrate  25 mg Oral BID  . nicotine  14 mg Transdermal Daily  . [START ON 02/20/2021] predniSONE  40 mg Oral QAC breakfast   Continuous Infusions: . cefTRIAXone (ROCEPHIN)  IV Stopped (02/18/21 1926)   PRN Meds:.acetaminophen, ALPRAZolam, docusate sodium, hydrALAZINE, ondansetron (  ZOFRAN) IV, oxyCODONE, polyethylene glycol      Subjective:   Alexiana Laverdure was seen and examined today.  States feeling a lot better today, BP overnight elevated.  No chest pain.  Shortness of breath is improving.  Still on 4 L O2, states not on O2 at home. Patient denies dizziness,abdominal pain, N/V/D/C, new weakness, numbess, tingling.  No fevers  Objective:   Vitals:   02/19/21 0700 02/19/21 0751 02/19/21 0800 02/19/21 0814  BP: (!) 150/96  (!) 159/95   Pulse: (!) 103  (!) 101   Resp: 20  (!) 23   Temp:  98.6 F (37 C)    TempSrc:  Oral    SpO2: 97%  93% 94%  Weight:      Height:        Intake/Output Summary (Last 24 hours) at 02/19/2021 0956 Last data filed at 02/19/2021 0349 Gross per 24 hour  Intake 220.11 ml  Output 585 ml  Net -364.89 ml     Wt Readings from Last 3 Encounters:  02/18/21 53.2 kg  04/25/20 47.9 kg  03/13/20 49.1 kg     Exam  General: Alert and oriented x 3, NAD, appears to be close to her baseline.  Cardiovascular: S1 S2 auscultated, no murmurs, RRR  Respiratory: Decreased breath sound at the bases, no wheezing.  Gastrointestinal: Soft, nontender, nondistended, + bowel sounds  Ext: no pedal edema bilaterally  Neuro: no new deficits  Musculoskeletal: No digital cyanosis, clubbing  Skin: No rashes  Psych: Normal affect and demeanor, alert and oriented  x3    Data Reviewed:  I have personally reviewed following labs and imaging studies  Micro Results Recent Results (from the past 240 hour(s))  Resp Panel by RT-PCR (Flu A&B, Covid) Nasopharyngeal Swab     Status: None   Collection Time: 02/16/21  9:55 PM   Specimen: Nasopharyngeal Swab; Nasopharyngeal(NP) swabs in vial transport medium  Result Value Ref Range Status   SARS Coronavirus 2 by RT PCR NEGATIVE NEGATIVE Final    Comment: (NOTE) SARS-CoV-2 target nucleic acids are NOT DETECTED.  The SARS-CoV-2 RNA is generally detectable in upper respiratory specimens during the acute phase of infection. The lowest concentration of SARS-CoV-2 viral copies this assay can detect is 138 copies/mL. A negative result does not preclude SARS-Cov-2 infection and should not be used as the sole basis for treatment or other patient management decisions. A negative result may occur with  improper specimen collection/handling, submission of specimen other than nasopharyngeal swab, presence of viral mutation(s) within the areas targeted by this assay, and inadequate number of viral copies(<138 copies/mL). A negative result must be combined with clinical observations, patient history, and epidemiological information. The expected result is Negative.  Fact Sheet for Patients:  EntrepreneurPulse.com.au  Fact Sheet for Healthcare Providers:  IncredibleEmployment.be  This test is no t yet approved or cleared by the [place] FDA and  has been authorized for detection and/or diagnosis of SARS-CoV-2 by FDA under an Emergency Use Authorization (EUA). This EUA will remain  in effect (meaning this test can be used) for the duration of the COVID-19 declaration under Section 564(b)(1) of the Act, 21 U.S.C.section 360bbb-3(b)(1), unless the authorization is terminated  or revoked sooner.       Influenza A by PCR NEGATIVE NEGATIVE Final   Influenza B by PCR NEGATIVE  NEGATIVE Final    Comment: (NOTE) The Xpert Xpress SARS-CoV-2/FLU/RSV plus assay is intended as an aid in the diagnosis of influenza from Nasopharyngeal swab  specimens and should not be used as a sole basis for treatment. Nasal washings and aspirates are unacceptable for Xpert Xpress SARS-CoV-2/FLU/RSV testing.  Fact Sheet for Patients: EntrepreneurPulse.com.au  Fact Sheet for Healthcare Providers: IncredibleEmployment.be  This test is not yet approved or cleared by the [place] FDA and has been authorized for detection and/or diagnosis of SARS-CoV-2 by FDA under an Emergency Use Authorization (EUA). This EUA will remain in effect (meaning this test can be used) for the duration of the COVID-19 declaration under Section 564(b)(1) of the Act, 21 U.S.C. section 360bbb-3(b)(1), unless the authorization is terminated or revoked.  Performed at [facility], [place] [address], [place], [place] [address]   Urine culture     Status: None   Collection Time: 02/16/21 10:13 PM   Specimen: Urine, Random  Result Value Ref Range Status   Specimen Description URINE, RANDOM  Final   Special Requests NONE  Final   Culture   Final    NO GROWTH Performed at [facility], [place] [address] [place], [place] [address]    Report Status 02/18/2021 FINAL  Final  Blood Cultures (routine x 2)     Status: None (Preliminary result)   Collection Time: 02/16/21 11:40 PM   Specimen: BLOOD LEFT WRIST  Result Value Ref Range Status   Specimen Description BLOOD LEFT WRIST  Final   Special Requests   Final    BOTTLES DRAWN AEROBIC AND ANAEROBIC Blood Culture results may not be optimal due to an inadequate volume of blood received in culture bottles   Culture   Final    NO GROWTH 2 DAYS Performed at [facility], [place] [address] [place], [place] [address]    Report Status PENDING  Incomplete  Blood Cultures (routine x 2)     Status: None (Preliminary  result)   Collection Time: 02/16/21 11:45 PM   Specimen: BLOOD  Result Value Ref Range Status   Specimen Description BLOOD LEFT ANTECUBITAL  Final   Special Requests   Final    BOTTLES DRAWN AEROBIC AND ANAEROBIC Blood Culture results may not be optimal due to an inadequate volume of blood received in culture bottles   Culture   Final    NO GROWTH 2 DAYS Performed at [facility], [place] [address] [place], [place] [address]    Report Status PENDING  Incomplete  MRSA PCR Screening     Status: None   Collection Time: 02/17/21  8:23 AM   Specimen: Nasopharyngeal  Result Value Ref Range Status   MRSA by PCR NEGATIVE NEGATIVE Final    Comment:        The GeneXpert MRSA Assay (FDA approved for NASAL specimens only), is one component of a comprehensive MRSA colonization surveillance program. It is not intended to diagnose MRSA infection nor to guide or monitor treatment for MRSA infections. Performed at [facility], [place] [address] [place], [place] [address]   Culture, Respiratory w Gram Stain     Status: None (Preliminary result)   Collection Time: 02/17/21 11:10 AM   Specimen: Tracheal Aspirate; Respiratory  Result Value Ref Range Status   Specimen Description TRACHEAL ASPIRATE  Final   Special Requests NONE  Final   Gram Stain PENDING  Incomplete   Culture   Final    FEW HAEMOPHILUS INFLUENZAE BETA LACTAMASE POSITIVE Performed at [facility], [address], [place], [place] [address]    Report Status PENDING  Incomplete  Respiratory (~20 pathogens) panel by PCR     Status: None   Collection Time: 02/18/21  8:43 AM   Specimen: Nasopharyngeal Swab; Respiratory  Result Value Ref Range Status   Adenovirus NOT DETECTED NOT DETECTED Final   Coronavirus 229E NOT DETECTED NOT DETECTED Final    Comment: (NOTE) The Coronavirus on the Respiratory Panel, DOES NOT test for the novel  Coronavirus (2019 nCoV)    Coronavirus HKU1 NOT DETECTED NOT DETECTED Final    Coronavirus NL63 NOT DETECTED NOT DETECTED Final   Coronavirus OC43 NOT DETECTED NOT DETECTED Final   Metapneumovirus NOT DETECTED NOT DETECTED Final   Rhinovirus / Enterovirus NOT DETECTED NOT DETECTED Final   Influenza A NOT DETECTED NOT DETECTED Final   Influenza B NOT DETECTED NOT DETECTED Final   Parainfluenza Virus 1 NOT DETECTED NOT DETECTED Final   Parainfluenza Virus 2 NOT DETECTED NOT DETECTED Final   Parainfluenza Virus 3 NOT DETECTED NOT DETECTED Final   Parainfluenza Virus 4 NOT DETECTED NOT DETECTED Final   Respiratory Syncytial Virus NOT DETECTED NOT DETECTED Final   Bordetella pertussis NOT DETECTED NOT DETECTED Final   Bordetella Parapertussis NOT DETECTED NOT DETECTED Final   Chlamydophila pneumoniae NOT DETECTED NOT DETECTED Final   Mycoplasma pneumoniae NOT DETECTED NOT DETECTED Final    Comment: Performed at [facility], [place]. [address], [place], [place] [address]    Radiology Reports CT Head Wo Contrast  Result Date: 02/16/2021 CLINICAL DATA:  Mental status unknown cause. EXAM: CT HEAD WITHOUT CONTRAST TECHNIQUE: Contiguous axial images were obtained from the base of the skull through the vertex without intravenous contrast. COMPARISON:  CT head 02/15/2020 FINDINGS: Brain: Patchy and confluent areas of decreased attenuation are noted throughout the deep and periventricular white matter of the cerebral hemispheres bilaterally, compatible with chronic microvascular ischemic disease. Chronic left basal ganglia lacunar infarctions. Interval development of chronic appearing second lacunar infarction of the left basal ganglia posteriorly (3:18). No evidence of large-territorial acute infarction. No parenchymal hemorrhage. No mass lesion. No extra-axial collection. No mass effect or midline shift. No hydrocephalus. Basilar cisterns are patent. Vascular: No hyperdense vessel. Atherosclerotic calcifications are present within the cavernous internal carotid arteries. Skull: No  acute fracture or focal lesion. Sinuses/Orbits: Almost complete opacification of the left maxillary sinus. Mucosal thickening and frothy secretions within bilateral nasal cavities and nasopharynx. Otherwise remaining paranasal sinuses and mastoid air cells are clear. The orbits are unremarkable. Other: Partially visualized endotracheal and enteric tubes. IMPRESSION: 1. No acute intracranial abnormality in a patient with a couple of left basal ganglia chronic lacunar infarctions (one of which is new compared to 2021). 2. Frothy secretions within bilateral nasal cavities/nasopharynx as well as almost complete opacification of the left maxillary sinus. Electronically Signed   By: Iven Finn M.D.   On: 02/16/2021 22:54   CT ANGIO CHEST PE W OR WO CONTRAST  Result Date: 02/17/2021 CLINICAL DATA:  Low O2 sats EXAM: CT ANGIOGRAPHY CHEST WITH CONTRAST TECHNIQUE: Multidetector CT imaging of the chest was performed using the standard protocol during bolus administration of intravenous contrast. Multiplanar CT image reconstructions and MIPs were obtained to evaluate the vascular anatomy. CONTRAST:  42m OMNIPAQUE IOHEXOL 350 MG/ML SOLN COMPARISON:  02/15/2020 FINDINGS: Cardiovascular: No filling defects in the pulmonary arteries to suggest pulmonary emboli. Heart is normal size. The the the extensive coronary artery and aortic calcifications. Aneurysmal dilatation of the ascending thoracic aorta, 4.2 cm, stable. Mediastinum/Nodes: Endotracheal tube in the lower trachea. No adenopathy. Thyroid  unremarkable. Lungs/Pleura: Extensive tree-in-bud nodular densities and ground-glass densities throughout the lungs, likely infectious or inflammatory. No effusions. Bibasilar scarring. Upper Abdomen: Imaging into the upper abdomen demonstrates no acute findings. Musculoskeletal: Chest wall soft tissues are unremarkable. No acute bony abnormality. Spinal stimulator in place, unchanged. Review of the MIP images confirms the above  findings. IMPRESSION: No evidence of pulmonary embolus. Extensive ground-glass and tree-in-bud nodular densities throughout the lungs, most pronounced in the upper lobes and lung bases. This is most likely infectious or inflammatory. Extensive coronary artery disease. 4.2 cm ascending thoracic aortic aneurysm, stable since prior study. Recommend annual imaging followup by CTA or MRA. This recommendation follows 2010 ACCF/AHA/AATS/ACR/ASA/SCA/SCAI/SIR/STS/SVM Guidelines for the Diagnosis and Management of Patients with Thoracic Aortic Disease. Circulation. 2010; 121: U110-R159. Aortic aneurysm NOS (ICD10-I71.9) Aortic Atherosclerosis (ICD10-I70.0). Electronically Signed   By: Rolm Baptise M.D.   On: 02/17/2021 01:24   DG Chest Port 1 View  Result Date: 02/18/2021 CLINICAL DATA:  70 year old female with acute respiratory failure EXAM: PORTABLE CHEST 1 VIEW COMPARISON:  Prior chest x-ray 02/16/2021 FINDINGS: The patient is intubated. The tip of the endotracheal tube is 2 cm above the carina. A gastric tube is present, the tip lies off the field of view presumably in the stomach. Stable cardiac and mediastinal contours. Aortic atherosclerosis. Mild chronic bronchitic changes and interstitial prominence. No focal airspace opacity, effusion, pneumothorax or significant pulmonary edema. No acute osseous abnormality. IMPRESSION: 1. The tip of the endotracheal tube is 2 cm above the carina. 2. The tip of the gastric tube is not visualized but lies below the diaphragm, presumably in the stomach. 3. Mild chronic bronchitic changes and interstitial prominence without acute cardiopulmonary process. Electronically Signed   By: Jacqulynn Cadet M.D.   On: 02/18/2021 07:23   DG Chest Portable 1 View  Result Date: 02/16/2021 CLINICAL DATA:  Shortness of breath. Altered mental status. Intubation. EXAM: PORTABLE CHEST 1 VIEW COMPARISON:  02/27/2020 FINDINGS: Endotracheal tube is present with tip measuring about 2.9 cm above the  carina. Enteric tube tip is off the field of view but below the left hemidiaphragm. Spinal stimulator leads at the midthoracic region. Normal heart size. No vascular congestion, edema, or consolidation. No pleural effusions. No pneumothorax. Mediastinal contours appear intact. Calcification of the aorta. IMPRESSION: Appliances appear in satisfactory position. No evidence of active pulmonary disease. Electronically Signed   By: Lucienne Capers M.D.   On: 02/16/2021 22:06   ECHOCARDIOGRAM COMPLETE  Result Date: 02/18/2021    ECHOCARDIOGRAM REPORT   Patient Name:   LAVERTA HARNISCH Date of Exam: 02/18/2021 Medical Rec #:  [id number]     Height:       62.0 in Accession #:    [id number]    Weight:       117.3 lb Date of Birth:  05-Dec-1950     BSA:          1.524 m Patient Age:    64 years      BP:           114/56 mmHg Patient Gender: F             HR:           113 bpm. Exam Location:  Inpatient Procedure: 2D Echo, 3D Echo, Cardiac Doppler and Color Doppler Indications:    I50.30* Unspecified diastolic (congestive) heart failure  History:        Patient has prior history of Echocardiogram examinations, most  recent 02/22/2020. Abnormal ECG, Signs/Symptoms:Altered Mental                 Status, Chest Pain, Shortness of Breath and Dyspnea; Risk                 Factors:Hypertension and Former Smoker. Elevated troponin.                 Respiratory failure.  Sonographer:    Roseanna Rainbow RDCS Referring Phys: [place]  Sonographer Comments: Suboptimal parasternal window. IMPRESSIONS  1. Left ventricular ejection fraction, by estimation, is 60 to 65%. The left ventricle has normal function. The left ventricle has no regional wall motion abnormalities. Left ventricular diastolic parameters were normal.  2. Right ventricular systolic function is normal. The right ventricular size is normal.  3. The mitral valve is abnormal. Trivial mitral valve regurgitation. No evidence of mitral stenosis.  4. The  aortic valve is calcified. There is moderate calcification of the aortic valve. Aortic valve regurgitation is not visualized. Mild to moderate aortic valve sclerosis/calcification is present, without any evidence of aortic stenosis.  5. The inferior vena cava is normal in size with greater than 50% respiratory variability, suggesting right atrial pressure of 3 mmHg. FINDINGS  Left Ventricle: Left ventricular ejection fraction, by estimation, is 60 to 65%. The left ventricle has normal function. The left ventricle has no regional wall motion abnormalities. Global longitudinal strain performed but not reported based on interpreter judgement due to suboptimal tracking. 3D left ventricular ejection fraction analysis performed but not reported based on interpreter judgement due to suboptimal quality. The left ventricular internal cavity size was normal in size. There is no left ventricular hypertrophy. Left ventricular diastolic parameters were normal. Right Ventricle: The right ventricular size is normal. No increase in right ventricular wall thickness. Right ventricular systolic function is normal. Left Atrium: Left atrial size was normal in size. Right Atrium: Right atrial size was normal in size. Pericardium: There is no evidence of pericardial effusion. Mitral Valve: The mitral valve is abnormal. There is mild thickening of the mitral valve leaflet(s). There is mild calcification of the mitral valve leaflet(s). Trivial mitral valve regurgitation. No evidence of mitral valve stenosis. Tricuspid Valve: The tricuspid valve is normal in structure. Tricuspid valve regurgitation is trivial. No evidence of tricuspid stenosis. Aortic Valve: The aortic valve is calcified. There is moderate calcification of the aortic valve. Aortic valve regurgitation is not visualized. Mild to moderate aortic valve sclerosis/calcification is present, without any evidence of aortic stenosis. Pulmonic Valve: The pulmonic valve was normal in  structure. Pulmonic valve regurgitation is not visualized. No evidence of pulmonic stenosis. Aorta: The aortic root is normal in size and structure. Venous: The inferior vena cava is normal in size with greater than 50% respiratory variability, suggesting right atrial pressure of 3 mmHg. IAS/Shunts: No atrial level shunt detected by color flow Doppler.  LEFT VENTRICLE PLAX 2D LVIDd:         3.70 cm     Diastology LVIDs:         2.40 cm     LV e' medial:    6.42 cm/s LV PW:         1.50 cm     LV E/e' medial:  15.2 LV IVS:        1.00 cm     LV e' lateral:   7.77 cm/s LVOT diam:     1.90 cm     LV E/e' lateral: 12.5  LV SV:         47 LV SV Index:   31 LVOT Area:     2.84 cm  LV Volumes (MOD) LV vol d, MOD A2C: 43.6 ml LV vol d, MOD A4C: 59.6 ml LV vol s, MOD A2C: 16.8 ml LV vol s, MOD A4C: 23.7 ml LV SV MOD A2C:     26.8 ml LV SV MOD A4C:     59.6 ml LV SV MOD BP:      31.9 ml RIGHT VENTRICLE             IVC RV S prime:     15.20 cm/s  IVC diam: 2.20 cm TAPSE (M-mode): 2.1 cm LEFT ATRIUM             Index       RIGHT ATRIUM           Index LA diam:        2.60 cm 1.71 cm/m  RA Area:     13.50 cm LA Vol (A2C):   36.0 ml 23.63 ml/m RA Volume:   32.50 ml  21.33 ml/m LA Vol (A4C):   17.3 ml 11.35 ml/m LA Biplane Vol: 25.1 ml 16.47 ml/m  AORTIC VALVE LVOT Vmax:   110.00 cm/s LVOT Vmean:  77.200 cm/s LVOT VTI:    0.167 m  AORTA Ao Root diam: 2.85 cm Ao Asc diam:  3.30 cm MITRAL VALVE MV Area (PHT): 5.84 cm     SHUNTS MV Decel Time: 130 msec     Systemic VTI:  0.17 m MV E velocity: 97.40 cm/s   Systemic Diam: 1.90 cm MV A velocity: 112.00 cm/s MV E/A ratio:  0.87 Jenkins Rouge MD Electronically signed by Jenkins Rouge MD Signature Date/Time: 02/18/2021/10:42:58 AM    Final     Lab Data:  CBC: Recent Labs  Lab 02/16/21 2212 02/16/21 2250 02/17/21 0438 02/17/21 0505 02/18/21 0038 02/19/21 0759  WBC 12.9*  --   --  10.8* 13.1* 11.2*  NEUTROABS 11.6*  --   --   --   --   --   HGB 14.5 15.6* 15.3* 12.6 14.4  15.9*  HCT 44.7 46.0 45.0 39.1 42.4 46.4*  MCV 109.6*  --   --  112.4* 106.0* 103.1*  PLT 170  --   --  164 180 793   Basic Metabolic Panel: Recent Labs  Lab 02/16/21 2212 02/16/21 2250 02/17/21 0438 02/17/21 0505 02/18/21 0038 02/19/21 0759  NA 130* 127* 130* 130* 131* 135  K 5.4* 4.9 4.8 4.5 3.7 3.7  CL 90*  --   --  88* 91* 95*  CO2 30  --   --  '31 27 28  ' GLUCOSE 150*  --   --  115* 92 111*  BUN 16  --   --  16 23 29*  CREATININE 0.76  --   --  0.88 0.98 0.61  CALCIUM 9.2  --   --  9.0 9.1 9.5  MG  --   --   --  1.9  --   --   PHOS  --   --   --  3.3  --   --    GFR: Estimated Creatinine Clearance: 54 mL/min (by C-G formula based on SCr of 0.61 mg/dL). Liver Function Tests: Recent Labs  Lab 02/16/21 2212  AST 24  ALT 14  ALKPHOS 77  BILITOT 0.9  PROT 7.2  ALBUMIN 3.5   No results for input(s): LIPASE,  AMYLASE in the last 168 hours. Recent Labs  Lab 02/16/21 2213  AMMONIA 57*   Coagulation Profile: Recent Labs  Lab 02/16/21 2320  INR 1.0   Cardiac Enzymes: No results for input(s): CKTOTAL, CKMB, CKMBINDEX, TROPONINI in the last 168 hours. BNP (last 3 results) No results for input(s): PROBNP in the last 8760 hours. HbA1C: No results for input(s): HGBA1C in the last 72 hours. CBG: Recent Labs  Lab 02/17/21 2009 02/18/21 0019 02/18/21 0757 02/18/21 1103 02/18/21 1543  GLUCAP 100* 104* 107* 128* 138*   Lipid Profile: Recent Labs    02/17/21 0505  TRIG 78   Thyroid Function Tests: No results for input(s): TSH, T4TOTAL, FREET4, T3FREE, THYROIDAB in the last 72 hours. Anemia Panel: No results for input(s): VITAMINB12, FOLATE, FERRITIN, TIBC, IRON, RETICCTPCT in the last 72 hours. Urine analysis:    Component Value Date/Time   COLORURINE AMBER (A) 02/16/2021 2212   APPEARANCEUR HAZY (A) 02/16/2021 2212   LABSPEC 1.024 02/16/2021 2212   PHURINE 5.0 02/16/2021 2212   GLUCOSEU NEGATIVE 02/16/2021 2212   HGBUR SMALL (A) 02/16/2021 2212    BILIRUBINUR NEGATIVE 02/16/2021 2212   KETONESUR 5 (A) 02/16/2021 2212   PROTEINUR >=300 (A) 02/16/2021 2212   UROBILINOGEN 0.2 08/02/2007 1052   NITRITE NEGATIVE 02/16/2021 2212   LEUKOCYTESUR NEGATIVE 02/16/2021 2212     Yaretzi Ernandez M.D. [facility] 02/19/2021, 9:56 AM  Available via Epic secure chat 7am-7pm After 7 pm, please refer to night coverage provider listed on amion.

## 2021-02-19 NOTE — Evaluation (Signed)
Clinical/Bedside Swallow Evaluation Patient Details  Name: Dawn Beasley MRN: [id number] Date of Birth: Nov 29, 1950  Today's Date: 02/19/2021 Time: SLP Start Time (ACUTE ONLY): 0850 SLP Stop Time (ACUTE ONLY): 0915 SLP Time Calculation (min) (ACUTE ONLY): 25 min  Past Medical History:  Past Medical History:  Diagnosis Date  . AAA (abdominal aortic aneurysm) (HCC)   . Anxiety   . Arthritis   . Back pain   . Cervical disc herniation   . Depression   . 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 Hypercholesteremia   . Hypertension   . Panic disorder   . Stenosis, spinal, lumbar    Past Surgical History:  Past Surgical History:  Procedure Laterality Date  . APPENDECTOMY    . BACK SURGERY    . CARPAL TUNNEL RELEASE    . LUMBAR LAMINECTOMY/DECOMPRESSION MICRODISCECTOMY N/A 04/12/2014   Procedure: MICRO LUMBAR DECOMPRESSION LUMBAR THREE TO FOUR,LUMBAR  FOUR TO FIVE;  Surgeon: Javier Docker, MD;  Location: [facility];  Service: Orthopedics;  Laterality: N/A;  . MAXOFACIAL SURGERY    . SPINAL CORD STIMULATOR INSERTION N/A 12/06/2015   Procedure: LUMBAR SPINAL CORD STIMULATOR INSERTION;  Surgeon: Venita Lick, MD;  Location: [facility] OR;  Service: Orthopedics;  Laterality: N/A;   HPI:  70yo female admitted 02/16/21 with dysarthria and decreased responsiveness. Emergently intubated in ED. Extubated 02/18/21. Found to have H Influenza PNA with acute hypercapnic and hypoxemic respiratory failure. PMH: AAA, back pain, cervical disc herniation, depression, HTN, panic disorder, spinal stenosis, lumbar spine stimulator, COPD, recurrent Cdiff. CXR = mild chronic bronchitic changes, interstitial prominence without acute cardiopulmonary process.   Assessment / Plan / Recommendation Clinical Impression  Pt seen at bedside for assessment of swallow function and safety, following 2 day intubation for respiratory failure. CN exam is unremarkable, volitional cough is strong, voice is clear. Pt reports no history of swallowing  difficulty. She tolerated trials of thin liquid, puree, and solid textures without oral issues or overt s/s aspiration, even with challenging. Pt passed the [place] screen without difficulty. Pt's respiratory rate was noted to increase, with apparent increase in work of breathing. At this time, will begin with dysphagia 2 (finely chopped) solids and thin liquids, meds whole for energy conservation. Safe swallow precautions posted at [facility]. ST will continue to follow to assess diet tolerance and readiness for advanced textures. RN and MD informed.   SLP Visit Diagnosis: Dysphagia, unspecified (R13.10)    Aspiration Risk  Mild aspiration risk    Diet Recommendation Dysphagia 2 (Fine chop);Thin liquid   Liquid Administration via: Cup;Straw Medication Administration: Whole meds with liquid Supervision: Patient able to self feed Compensations: Slow rate;Small sips/bites;Other (Comment) (rest breaks as needed for increased WOB)    Other  Recommendations Oral Care Recommendations: Oral care BID   Follow up Recommendations None      Frequency and Duration min 1 x/week  1 week;2 weeks       Prognosis Prognosis for Safe Diet Advancement: Good      Swallow Study   General Date of Onset: 02/16/21 HPI: 70yo female admitted 02/16/21 with dysarthria and decreased responsiveness. Emergently intubated in ED. Extubated 02/18/21. Found to have H Influenza PNA with acute hypercapnic and hypoxemic respiratory failure. PMH: AAA, back pain, cervical disc herniation, depression, HTN, panic disorder, spinal stenosis, lumbar spine stimulator, COPD, recurrent Cdiff. CXR = mild chronic bronchitic changes, interstitial prominence without acute cardiopulmonary process. Type of Study: Bedside Swallow Evaluation Previous Swallow Assessment: Late March-Early April 2021 - DC'd on  reg/thin Diet Prior to this Study: NPO Temperature Spikes Noted: No Respiratory Status: Nasal cannula History of Recent Intubation: Yes Length  of Intubations (days): 2 days Date extubated: 02/18/21 Behavior/Cognition: Alert;Cooperative;Pleasant mood Oral Cavity Assessment: Within Functional Limits Oral Care Completed by SLP: No Oral Cavity - Dentition: Adequate natural dentition Vision: Functional for self-feeding Self-Feeding Abilities: Able to feed self Patient Positioning: Upright in bed Baseline Vocal Quality: Normal Volitional Cough: Strong Volitional Swallow: Able to elicit    Oral/Motor/Sensory Function Overall Oral Motor/Sensory Function: Within functional limits   Ice Chips Ice chips: Within functional limits Presentation: Spoon   Thin Liquid Thin Liquid: Within functional limits Presentation: Straw    Nectar Thick Nectar Thick Liquid: Not tested   Honey Thick Honey Thick Liquid: Not tested   Puree Puree: Within functional limits Presentation: Spoon   Solid     Solid: Within functional limits Presentation: Self Fed     Masashi Snowdon B. Murvin Natal, [facility], CCC-SLP Speech Language Pathologist Office: [phone]  Leigh Aurora 02/19/2021,9:26 AM

## 2021-02-20 LAB — CBC
HCT: 48.2 % — ABNORMAL HIGH (ref 36.0–46.0)
Hemoglobin: 16.2 g/dL — ABNORMAL HIGH (ref 12.0–15.0)
MCH: 35 pg — ABNORMAL HIGH (ref 26.0–34.0)
MCHC: 33.6 g/dL (ref 30.0–36.0)
MCV: 104.1 fL — ABNORMAL HIGH (ref 80.0–100.0)
Platelets: 285 10*3/uL (ref 150–400)
RBC: 4.63 MIL/uL (ref 3.87–5.11)
RDW: 13 % (ref 11.5–15.5)
WBC: 10.6 10*3/uL — ABNORMAL HIGH (ref 4.0–10.5)
nRBC: 0 % (ref 0.0–0.2)

## 2021-02-20 LAB — BASIC METABOLIC PANEL
Anion gap: 15 (ref 5–15)
BUN: 39 mg/dL — ABNORMAL HIGH (ref 8–23)
CO2: 26 mmol/L (ref 22–32)
Calcium: 9.6 mg/dL (ref 8.9–10.3)
Chloride: 96 mmol/L — ABNORMAL LOW (ref 98–111)
Creatinine, Ser: 0.76 mg/dL (ref 0.44–1.00)
GFR, Estimated: >60 mL/min (ref >60)
Glucose, Bld: 124 mg/dL — ABNORMAL HIGH (ref 70–99)
Potassium: 4.3 mmol/L (ref 3.5–5.1)
Sodium: 137 mmol/L (ref 135–145)

## 2021-02-20 MED ORDER — METOPROLOL TARTRATE 50 MG PO TABS: 50.0000 mg | ORAL_TABLET | Freq: Two times a day (BID) | ORAL | 3 refills | Status: DC

## 2021-02-20 MED ORDER — LOSARTAN POTASSIUM 100 MG PO TABS: 100.0000 mg | ORAL_TABLET | Freq: Every day | ORAL | 3 refills | Status: AC

## 2021-02-20 MED ORDER — METOPROLOL TARTRATE 50 MG PO TABS
50.0000 mg | ORAL_TABLET | Freq: Two times a day (BID) | ORAL | Status: DC
  Administered 2021-02-20: 50 mg via ORAL
  Filled 2021-02-20: qty 1

## 2021-02-20 MED ORDER — GABAPENTIN 300 MG PO CAPS: 300.0000 mg | ORAL_CAPSULE | Freq: Three times a day (TID) | ORAL | Status: AC

## 2021-02-20 MED ORDER — AMLODIPINE BESYLATE 5 MG PO TABS
5.0000 mg | ORAL_TABLET | Freq: Every day | ORAL | Status: DC
  Administered 2021-02-20: 5 mg via ORAL
  Filled 2021-02-20: qty 1

## 2021-02-20 MED ORDER — AMLODIPINE BESYLATE 5 MG PO TABS: 5.0000 mg | ORAL_TABLET | Freq: Every day | ORAL | 3 refills | Status: DC

## 2021-02-20 MED ORDER — PREDNISONE 20 MG PO TABS: 40.0000 mg | ORAL_TABLET | Freq: Every day | ORAL | 0 refills | Status: AC

## 2021-02-20 MED ORDER — FUROSEMIDE 20 MG PO TABS: 20.0000 mg | ORAL_TABLET | Freq: Every day | ORAL | 1 refills | Status: DC

## 2021-02-20 MED ORDER — AMOXICILLIN-POT CLAVULANATE 875-125 MG PO TABS: 1.0000 | ORAL_TABLET | Freq: Two times a day (BID) | ORAL | 0 refills | Status: AC

## 2021-02-20 MED ORDER — PREDNISONE 20 MG PO TABS: 40.0000 mg | ORAL_TABLET | Freq: Every day | ORAL | 0 refills | Status: DC

## 2021-02-20 MED ORDER — AMOXICILLIN-POT CLAVULANATE 875-125 MG PO TABS
1.0000 | ORAL_TABLET | Freq: Two times a day (BID) | ORAL | Status: DC
  Administered 2021-02-20: 1 via ORAL
  Filled 2021-02-20: qty 1

## 2021-02-20 MED ORDER — FLUTICASONE FUROATE-VILANTEROL 100-25 MCG/INH IN AEPB: 1.0000 | INHALATION_SPRAY | Freq: Every day | RESPIRATORY_TRACT | 3 refills | Status: AC

## 2021-02-20 NOTE — Plan of Care (Signed)
  Problem: Safety: Goal: Non-violent Restraint(s) Outcome: Adequate for Discharge   Problem: Education: Goal: Knowledge of General Education information will improve Description: Including pain rating scale, medication(s)/side effects and non-pharmacologic comfort measures Outcome: Adequate for Discharge   Problem: Health Behavior/Discharge Planning: Goal: Ability to manage health-related needs will improve Outcome: Adequate for Discharge   Problem: Clinical Measurements: Goal: Ability to maintain clinical measurements within normal limits will improve Outcome: Adequate for Discharge Goal: Will remain free from infection Outcome: Adequate for Discharge Goal: Diagnostic test results will improve Outcome: Adequate for Discharge Goal: Respiratory complications will improve Outcome: Adequate for Discharge Goal: Cardiovascular complication will be avoided Outcome: Adequate for Discharge   

## 2021-02-20 NOTE — TOC Transition Note (Signed)
Transition of Care [facility]) - CM/SW Discharge Note   Patient Details  Name: Dawn Beasley MRN: [id number] Date of Birth: [birth year]  [facility] CM/SW Contact:  Leone Haven, RN Phone Number: 02/20/2021, 12:18 PM   Clinical Narrative:    NCM spoke with patient at the bedside, offered choice for [facility] services, she states she does not want HH services.  She states she is ok with [employer] supplying her home oxygen for  Her.  NCM made referral to [facility] with [employer]. The tank will be brought up to patient room prior to dc.   Final next level of care: Home/Self Care Barriers to Discharge: No Barriers Identified   Patient Goals and CMS Choice Patient states their goals for this hospitalization and ongoing recovery are:: return home CMS Medicare.gov Compare Post Acute Care list provided to:: Patient Choice offered to / list presented to : Patient  Discharge Placement                       Discharge Plan and Services                DME Arranged: Oxygen DME Agency: [employer] Date DME Agency Contacted: 02/20/21 Time DME Agency Contacted: [id number] Representative spoke with at DME Agency: Ian Malkin HH Arranged: Refused HH          Social Determinants of Health (SDOH) Interventions     Readmission Risk Interventions No flowsheet data found.

## 2021-02-20 NOTE — Progress Notes (Signed)
SATURATION QUALIFICATIONS: (This note is used to comply with regulatory documentation for home oxygen)  Patient Saturations on Room Air at Rest = 93%  Patient Saturations on Room Air while Ambulating = 86%  Patient Saturations on 2 Liters of oxygen while Ambulating = 92%  Please briefly explain why patient needs home oxygen: Patient desatted while ambulating to 86% on room air.  2 L of oxygen applied via nasal cannula.  Patient's oxygen level increased to 92% on 2L while ambulating

## 2021-02-20 NOTE — Progress Notes (Signed)
D/C instructions given and reviewed. Tele and IV removed, tolerated well. Awaiting O2 delivery prior to D/C.

## 2021-02-20 NOTE — Plan of Care (Signed)
  Problem: Activity: Goal: Risk for activity intolerance will decrease Outcome: Progressing   Problem: Nutrition: Goal: Adequate nutrition will be maintained Outcome: Progressing   

## 2021-02-20 NOTE — Discharge Summary (Addendum)
Physician Discharge Summary   Patient ID: REINETTE CUNEO MRN: [id number] DOB/AGE: 1951/06/17 70 y.o.  Admit date: 02/16/2021 Discharge date: 02/20/2021  Primary Care Physician:  Cyndi Bender, PA-C   Recommendations for Outpatient Follow-up:  1. Follow up with PCP in 1-2 weeks 2. Patient placed on Lasix 20 mg daily, please follow BMET and assessment if she needs it daily versus as needed. 3. Placed on Augmentin 1 tab p.o. twice daily for 7 days for H. influenzae pneumonia 4. Ambulatory referral to pulmonology sent, patient also recommended to follow-up with pulmonology. 5. Please follow BP and adjust medications, started on Lasix 20 mg daily, amlodipine 5 mg daily, metoprolol 50 mg twice daily, continue losartan 100 mg daily 6. Placed on 2 L O2 via nasal cannula with exertion   SATURATION QUALIFICATIONS: (This note is used to comply with regulatory documentation for home oxygen)  Patient Saturations on Room Air at Rest = 93%  Patient Saturations on Room Air while Ambulating = 86%  Patient Saturations on 2 Liters of oxygen while Ambulating = 92%   Home Health: Home health PT OT RN Equipment/Devices: None  Discharge Condition: stable  CODE STATUS: FULL  Diet recommendation: Heart healthy diet   Discharge Diagnoses:    . Acute on chronic respiratory failure with hypoxia (HCC) Acute decompensated diastolic CHF . H influenza pneumonia, community-acquired pneumonia ([place]) . Essential hypertension, uncontrolled . Sacral decubitus ulcer, stage II ([place]) . COPD with acute exacerbation ([place]) . Generalized debility . Acute metabolic encephalopathy, resolved   Consults: Patient was admitted by critical care   Allergies:   Allergies  Allergen Reactions  . Celebrex [Celecoxib] Swelling    Throat swelling  . Sulfa Antibiotics Swelling    Throat swelling     DISCHARGE MEDICATIONS: Allergies as of 02/20/2021      Reactions   Celebrex [celecoxib] Swelling   Throat swelling    Sulfa Antibiotics Swelling   Throat swelling      Medication List    TAKE these medications   acetaminophen 500 MG tablet Commonly known as: TYLENOL Take 500 mg by mouth every 6 (six) hours as needed for moderate pain.   albuterol 108 (90 Base) MCG/ACT inhaler Commonly known as: VENTOLIN HFA Inhale 2 puffs into the lungs every 4 (four) hours as needed for shortness of breath or wheezing.   ALPRAZolam 0.5 MG tablet Commonly known as: XANAX Take 1 tablet (0.5 mg total) by mouth 2 (two) times daily as needed for anxiety.   amLODipine 5 MG tablet Commonly known as: NORVASC Take 1 tablet (5 mg total) by mouth daily. Start taking on: February 21, 2021   amoxicillin-clavulanate 875-125 MG tablet Commonly known as: AUGMENTIN Take 1 tablet by mouth 2 (two) times daily for 7 days.   fluticasone furoate-vilanterol 100-25 MCG/INH Aepb Commonly known as: BREO ELLIPTA Inhale 1 puff into the lungs daily.   furosemide 20 MG tablet Commonly known as: Lasix Take 1 tablet (20 mg total) by mouth daily.   gabapentin 300 MG capsule Commonly known as: NEURONTIN Take 1 capsule (300 mg total) by mouth 3 (three) times daily. What changed: how much to take   ibuprofen 800 MG tablet Commonly known as: ADVIL Take 800 mg by mouth every 8 (eight) hours as needed for moderate pain.   losartan 100 MG tablet Commonly known as: COZAAR Take 1 tablet (100 mg total) by mouth daily.   metoprolol tartrate 50 MG tablet Commonly known as: LOPRESSOR Take 1 tablet (50 mg total) by  mouth 2 (two) times daily.   oxyCODONE-acetaminophen 10-325 MG tablet Commonly known as: PERCOCET Take 1 tablet by mouth 4 (four) times daily as needed for pain.   predniSONE 20 MG tablet Commonly known as: DELTASONE Take 2 tablets (40 mg total) by mouth daily before breakfast for 5 days. Start taking on: February 21, 2021   traZODone 100 MG tablet Commonly known as: DESYREL Take 100-200 mg by mouth at bedtime as needed for  sleep.            Durable Medical Equipment  (From admission, onward)         Start     Ordered   02/20/21 1206  For home use only DME oxygen  Once       Question Answer Comment  Length of Need 12 Months   Mode or (Route) Nasal cannula   Liters per Minute 2   Frequency Continuous (stationary and portable oxygen unit needed)   Oxygen conserving device Yes   Oxygen delivery system Gas      02/20/21 1205           Brief H and P: For complete details please refer to admission H and P, but in brief Patient is a 70 year old female with history of chronic back pain, AAA, depression, hypertension, anxiety, nicotine use, presented to ED from home.  Patient's husband had noticed that she was slurring her speech while at dinner, also became less responsive.  EMS was called and patient was found to be hypoxic and obtunded.  She was placed on 100% nonrebreather.  In ED, she was emergently intubated and admitted to ICU.  She also received Narcan prior to arrival to ED. In ED, tachycardia 110, 10.1 2.3 F, respiratory rate 25 Patient was admitted with acute decompensated CHF, acute metabolic encephalopathy, waxing and waning mental status, acute encephalopathy. She was extubated on 02/18/2021.  She was transferred to [facility] service, assumed care on 4/5  Hospital Course:    Acute on chronic respiratory failure with hypoxia and hypercapnia ([place]), community-acquired pneumonia, haemophilus influenza pneumonia -Likely due to acute COPD exacerbation, states she is not on any O2 at home.  Also takes oxycodone, Xanax, gabapentin  -Extubated on 4/4, patient has tolerated extubation, O2 sats have been improving, weaned O2, currently sats 95% on 2 L -Solu-Medrol IV was tapered and transitioned to oral prednisone, continue for 5 days -Patient was placed on IV Rocephin, scheduled nebs.  Transition to oral Augmentin for 7 more days to complete full course. -CTA chest showed no PE, extensive groundglass and  tree-in-bud nodular densities, most pronounced in upper lobes and lung bases, throughout the lungs -COVID-19 negative, respiratory virus panel negative, tracheal culture positive for haemophilus influenza -PT evaluation recommended home health, home O2 evaluation was done. -Recommended pulmonology outpatient follow-up   Acute decompensated diastolic CHF -BNP elevated 542.6 at the time of admission, mildly elevated troponin likely due to demand ischemia -Patient received multiple doses of IV Lasix, 2D echo showed EF of 60 to 65%, normal LV function, no regional wall motion abnormalities. -Transition to oral Lasix, at discharge will continue 20 mg daily to be reassessed by PCP at follow-up  Chronic back pain  -Currently stable, cautious use of narcotics, patient takes Percocet at home    Essential hypertension -BP somewhat uncontrolled, continue losartan, added metoprolol, Norvasc.   -Continue Lasix 20 mg daily  -Outpatient follow-up with PCP and readjustment of medications as needed    Aspiration/dysphagia ([place]) -SLP evaluation done, recommended dysphagia 2  diet with thin liquids, tolerating diet     Sacral decubitus ulcer, stage II ([place]) -Continue nursing care, not present on admission per RN documentation   acute metabolic encephalopathy -Likely due to #1, patient also takes gabapentin, Percocet, Xanax -Currently appears to be back at baseline, alert and oriented x3 -Neurontin was decreased to 300 mg 3 times daily  Generalized debility -Home health PT OT, O2 was arranged transfer to the floor, PT OT evaluation, home O2 evaluation.  States she lives at home with her husband      Day of Discharge S: Alert and oriented, no acute issues, O2 sats 97% on 2 L.  Hoping to go home today.  No fevers*  BP (!) 162/95   Pulse 97   Temp 99.2 F (37.3 C) (Oral)   Resp 18   Ht _0  (1.6 m)   Wt 50.9 kg   SpO2 95%   BMI 19.89 kg/m   Physical Exam: General: Alert and  awake oriented x3 not in any acute distress. HEENT: anicteric sclera, pupils reactive to light and accommodation CVS: S1-S2 clear no murmur rubs or gallops Chest: clear to auscultation bilaterally, no wheezing rales or rhonchi Abdomen: soft nontender, nondistended, normal bowel sounds Extremities: no cyanosis, clubbing or edema noted bilaterally Neuro: Cranial nerves II-XII intact, no focal neurological deficits    Get Medicines reviewed and adjusted: Please take all your medications with you for your next visit with your Primary MD  Please request your Primary MD to go over all hospital tests and procedure/radiological results at the follow up. Please ask your Primary MD to get all Hospital records sent to his/her office.  If you experience worsening of your admission symptoms, develop shortness of breath, life threatening emergency, suicidal or homicidal thoughts you must seek medical attention immediately by calling 911 or calling your MD immediately  if symptoms less severe.  You must read complete instructions/literature along with all the possible adverse reactions/side effects for all the Medicines you take and that have been prescribed to you. Take any new Medicines after you have completely understood and accept all the possible adverse reactions/side effects.   Do not drive when taking pain medications.   Do not take more than prescribed Pain, Sleep and Anxiety Medications  Special Instructions: If you have smoked or chewed Tobacco  in the last 2 yrs please stop smoking, stop any regular Alcohol  and or any Recreational drug use.  Wear Seat belts while driving.  Please note  You were cared for by a hospitalist during your hospital stay. Once you are discharged, your primary care physician will handle any further medical issues. Please note that NO REFILLS for any discharge medications will be authorized once you are discharged, as it is imperative that you return to your  primary care physician (or establish a relationship with a primary care physician if you do not have one) for your aftercare needs so that they can reassess your need for medications and monitor your lab values.   The results of significant diagnostics from this hospitalization (including imaging, microbiology, ancillary and laboratory) are listed below for reference.      Procedures/Studies:  CT Head Wo Contrast  Result Date: 02/16/2021 CLINICAL DATA:  Mental status unknown cause. EXAM: CT HEAD WITHOUT CONTRAST TECHNIQUE: Contiguous axial images were obtained from the base of the skull through the vertex without intravenous contrast. COMPARISON:  CT head 02/15/2020 FINDINGS: Brain: Patchy and confluent areas of decreased attenuation are noted throughout the  deep and periventricular white matter of the cerebral hemispheres bilaterally, compatible with chronic microvascular ischemic disease. Chronic left basal ganglia lacunar infarctions. Interval development of chronic appearing second lacunar infarction of the left basal ganglia posteriorly (3:18). No evidence of large-territorial acute infarction. No parenchymal hemorrhage. No mass lesion. No extra-axial collection. No mass effect or midline shift. No hydrocephalus. Basilar cisterns are patent. Vascular: No hyperdense vessel. Atherosclerotic calcifications are present within the cavernous internal carotid arteries. Skull: No acute fracture or focal lesion. Sinuses/Orbits: Almost complete opacification of the left maxillary sinus. Mucosal thickening and frothy secretions within bilateral nasal cavities and nasopharynx. Otherwise remaining paranasal sinuses and mastoid air cells are clear. The orbits are unremarkable. Other: Partially visualized endotracheal and enteric tubes. IMPRESSION: 1. No acute intracranial abnormality in a patient with a couple of left basal ganglia chronic lacunar infarctions (one of which is new compared to 2021). 2. Frothy  secretions within bilateral nasal cavities/nasopharynx as well as almost complete opacification of the left maxillary sinus. Electronically Signed   By: Iven Finn M.D.   On: 02/16/2021 22:54   CT ANGIO CHEST PE W OR WO CONTRAST  Result Date: 02/17/2021 CLINICAL DATA:  Low O2 sats EXAM: CT ANGIOGRAPHY CHEST WITH CONTRAST TECHNIQUE: Multidetector CT imaging of the chest was performed using the standard protocol during bolus administration of intravenous contrast. Multiplanar CT image reconstructions and MIPs were obtained to evaluate the vascular anatomy. CONTRAST:  27m OMNIPAQUE IOHEXOL 350 MG/ML SOLN COMPARISON:  02/15/2020 FINDINGS: Cardiovascular: No filling defects in the pulmonary arteries to suggest pulmonary emboli. Heart is normal size. The the the extensive coronary artery and aortic calcifications. Aneurysmal dilatation of the ascending thoracic aorta, 4.2 cm, stable. Mediastinum/Nodes: Endotracheal tube in the lower trachea. No adenopathy. Thyroid unremarkable. Lungs/Pleura: Extensive tree-in-bud nodular densities and ground-glass densities throughout the lungs, likely infectious or inflammatory. No effusions. Bibasilar scarring. Upper Abdomen: Imaging into the upper abdomen demonstrates no acute findings. Musculoskeletal: Chest wall soft tissues are unremarkable. No acute bony abnormality. Spinal stimulator in place, unchanged. Review of the MIP images confirms the above findings. IMPRESSION: No evidence of pulmonary embolus. Extensive ground-glass and tree-in-bud nodular densities throughout the lungs, most pronounced in the upper lobes and lung bases. This is most likely infectious or inflammatory. Extensive coronary artery disease. 4.2 cm ascending thoracic aortic aneurysm, stable since prior study. Recommend annual imaging followup by CTA or MRA. This recommendation follows 2010 ACCF/AHA/AATS/ACR/ASA/SCA/SCAI/SIR/STS/SVM Guidelines for the Diagnosis and Management of Patients with Thoracic  Aortic Disease. Circulation. 2010; 121:: P594-V859 Aortic aneurysm NOS (ICD10-I71.9) Aortic Atherosclerosis (ICD10-I70.0). Electronically Signed   By: KRolm BaptiseM.D.   On: 02/17/2021 01:24   DG Chest Port 1 View  Result Date: 02/18/2021 CLINICAL DATA:  70year old female with acute respiratory failure EXAM: PORTABLE CHEST 1 VIEW COMPARISON:  Prior chest x-ray 02/16/2021 FINDINGS: The patient is intubated. The tip of the endotracheal tube is 2 cm above the carina. A gastric tube is present, the tip lies off the field of view presumably in the stomach. Stable cardiac and mediastinal contours. Aortic atherosclerosis. Mild chronic bronchitic changes and interstitial prominence. No focal airspace opacity, effusion, pneumothorax or significant pulmonary edema. No acute osseous abnormality. IMPRESSION: 1. The tip of the endotracheal tube is 2 cm above the carina. 2. The tip of the gastric tube is not visualized but lies below the diaphragm, presumably in the stomach. 3. Mild chronic bronchitic changes and interstitial prominence without acute cardiopulmonary process. Electronically Signed   By: HDellis FilbertD.  On: 02/18/2021 07:23   DG Chest Portable 1 View  Result Date: 02/16/2021 CLINICAL DATA:  Shortness of breath. Altered mental status. Intubation. EXAM: PORTABLE CHEST 1 VIEW COMPARISON:  02/27/2020 FINDINGS: Endotracheal tube is present with tip measuring about 2.9 cm above the carina. Enteric tube tip is off the field of view but below the left hemidiaphragm. Spinal stimulator leads at the midthoracic region. Normal heart size. No vascular congestion, edema, or consolidation. No pleural effusions. No pneumothorax. Mediastinal contours appear intact. Calcification of the aorta. IMPRESSION: Appliances appear in satisfactory position. No evidence of active pulmonary disease. Electronically Signed   By: Lucienne Capers M.D.   On: 02/16/2021 22:06   ECHOCARDIOGRAM COMPLETE  Result Date: 02/18/2021     ECHOCARDIOGRAM REPORT   Patient Name:   GIAMARIE BUECHE Date of Exam: 02/18/2021 Medical Rec #:  [id number]     Height:       62.0 in Accession #:    [id number]    Weight:       117.3 lb Date of Birth:  April 27, 1951     BSA:          1.524 m Patient Age:    38 years      BP:           114/56 mmHg Patient Gender: F             HR:           113 bpm. Exam Location:  Inpatient Procedure: 2D Echo, 3D Echo, Cardiac Doppler and Color Doppler Indications:    I50.30* Unspecified diastolic (congestive) heart failure  History:        Patient has prior history of Echocardiogram examinations, most                 recent 02/22/2020. Abnormal ECG, Signs/Symptoms:Altered Mental                 Status, Chest Pain, Shortness of Breath and Dyspnea; Risk                 Factors:Hypertension and Former Smoker. Elevated troponin.                 Respiratory failure.  Sonographer:    Roseanna Rainbow RDCS Referring Phys: [place]  Sonographer Comments: Suboptimal parasternal window. IMPRESSIONS  1. Left ventricular ejection fraction, by estimation, is 60 to 65%. The left ventricle has normal function. The left ventricle has no regional wall motion abnormalities. Left ventricular diastolic parameters were normal.  2. Right ventricular systolic function is normal. The right ventricular size is normal.  3. The mitral valve is abnormal. Trivial mitral valve regurgitation. No evidence of mitral stenosis.  4. The aortic valve is calcified. There is moderate calcification of the aortic valve. Aortic valve regurgitation is not visualized. Mild to moderate aortic valve sclerosis/calcification is present, without any evidence of aortic stenosis.  5. The inferior vena cava is normal in size with greater than 50% respiratory variability, suggesting right atrial pressure of 3 mmHg. FINDINGS  Left Ventricle: Left ventricular ejection fraction, by estimation, is 60 to 65%. The left ventricle has normal function. The left ventricle has no regional  wall motion abnormalities. Global longitudinal strain performed but not reported based on interpreter judgement due to suboptimal tracking. 3D left ventricular ejection fraction analysis performed but not reported based on interpreter judgement due to suboptimal quality. The left ventricular internal cavity size was normal in size. There is no left ventricular  hypertrophy. Left ventricular diastolic parameters were normal. Right Ventricle: The right ventricular size is normal. No increase in right ventricular wall thickness. Right ventricular systolic function is normal. Left Atrium: Left atrial size was normal in size. Right Atrium: Right atrial size was normal in size. Pericardium: There is no evidence of pericardial effusion. Mitral Valve: The mitral valve is abnormal. There is mild thickening of the mitral valve leaflet(s). There is mild calcification of the mitral valve leaflet(s). Trivial mitral valve regurgitation. No evidence of mitral valve stenosis. Tricuspid Valve: The tricuspid valve is normal in structure. Tricuspid valve regurgitation is trivial. No evidence of tricuspid stenosis. Aortic Valve: The aortic valve is calcified. There is moderate calcification of the aortic valve. Aortic valve regurgitation is not visualized. Mild to moderate aortic valve sclerosis/calcification is present, without any evidence of aortic stenosis. Pulmonic Valve: The pulmonic valve was normal in structure. Pulmonic valve regurgitation is not visualized. No evidence of pulmonic stenosis. Aorta: The aortic root is normal in size and structure. Venous: The inferior vena cava is normal in size with greater than 50% respiratory variability, suggesting right atrial pressure of 3 mmHg. IAS/Shunts: No atrial level shunt detected by color flow Doppler.  LEFT VENTRICLE PLAX 2D LVIDd:         3.70 cm     Diastology LVIDs:         2.40 cm     LV e' medial:    6.42 cm/s LV PW:         1.50 cm     LV E/e' medial:  15.2 LV IVS:         1.00 cm     LV e' lateral:   7.77 cm/s LVOT diam:     1.90 cm     LV E/e' lateral: 12.5 LV SV:         47 LV SV Index:   31 LVOT Area:     2.84 cm  LV Volumes (MOD) LV vol d, MOD A2C: 43.6 ml LV vol d, MOD A4C: 59.6 ml LV vol s, MOD A2C: 16.8 ml LV vol s, MOD A4C: 23.7 ml LV SV MOD A2C:     26.8 ml LV SV MOD A4C:     59.6 ml LV SV MOD BP:      31.9 ml RIGHT VENTRICLE             IVC RV S prime:     15.20 cm/s  IVC diam: 2.20 cm TAPSE (M-mode): 2.1 cm LEFT ATRIUM             Index       RIGHT ATRIUM           Index LA diam:        2.60 cm 1.71 cm/m  RA Area:     13.50 cm LA Vol (A2C):   36.0 ml 23.63 ml/m RA Volume:   32.50 ml  21.33 ml/m LA Vol (A4C):   17.3 ml 11.35 ml/m LA Biplane Vol: 25.1 ml 16.47 ml/m  AORTIC VALVE LVOT Vmax:   110.00 cm/s LVOT Vmean:  77.200 cm/s LVOT VTI:    0.167 m  AORTA Ao Root diam: 2.85 cm Ao Asc diam:  3.30 cm MITRAL VALVE MV Area (PHT): 5.84 cm     SHUNTS MV Decel Time: 130 msec     Systemic VTI:  0.17 m MV E velocity: 97.40 cm/s   Systemic Diam: 1.90 cm MV A velocity: 112.00 cm/s MV E/A ratio:  0.87 Jenkins Rouge MD Electronically signed by Jenkins Rouge MD Signature Date/Time: 02/18/2021/10:42:58 AM    Final       LAB RESULTS: Basic Metabolic Panel: Recent Labs  Lab 02/17/21 0505 02/18/21 0038 02/19/21 0759 02/20/21 0347  NA 130*   < > 135 137  K 4.5   < > 3.7 4.3  CL 88*   < > 95* 96*  CO2 31   < > 28 26  GLUCOSE 115*   < > 111* 124*  BUN 16   < > 29* 39*  CREATININE 0.88   < > 0.61 0.76  CALCIUM 9.0   < > 9.5 9.6  MG 1.9  --   --   --   PHOS 3.3  --   --   --    < > = values in this interval not displayed.   Liver Function Tests: Recent Labs  Lab 02/16/21 2212  AST 24  ALT 14  ALKPHOS 77  BILITOT 0.9  PROT 7.2  ALBUMIN 3.5   No results for input(s): LIPASE, AMYLASE in the last 168 hours. Recent Labs  Lab 02/16/21 2213  AMMONIA 57*   CBC: Recent Labs  Lab 02/16/21 2212 02/16/21 2250 02/19/21 0759 02/20/21 0347  WBC 12.9*   < >  11.2* 10.6*  NEUTROABS 11.6*  --   --   --   HGB 14.5   < > 15.9* 16.2*  HCT 44.7   < > 46.4* 48.2*  MCV 109.6*   < > 103.1* 104.1*  PLT 170   < > 250 285   < > = values in this interval not displayed.   Cardiac Enzymes: No results for input(s): CKTOTAL, CKMB, CKMBINDEX, TROPONINI in the last 168 hours. BNP: Invalid input(s): POCBNP CBG: Recent Labs  Lab 02/18/21 1103 02/18/21 1543  GLUCAP 128* 138*       Disposition and Follow-up: Discharge Instructions    Ambulatory referral to Pulmonology   Complete by: As directed    70 year old female was admitted to ICU with hypoxic and hypercapnic respiratory failure likely due to COPD and CHF exacerbation.  Needs pulmonology follow-up.   Reason for referral: Asthma/COPD   Diet - low sodium heart healthy   Complete by: As directed    Increase activity slowly   Complete by: As directed        DISPOSITION: Home with home health   Ogden, Jonathan B, MD. Schedule an appointment as soon as possible for a visit in 2 week(s).   Specialty: Pulmonary Disease Why: Please call to schedule a follow up appointment to monitor your lung disease. Contact information: [address] [place] [address] [phone]        Cyndi Bender, PA-C Follow up on 03/06/2021.   Specialty: Physician Assistant Why: 11 am Contact information: [place] [address] [phone]        [facility] Follow up.   Why: home oxygen Contact information: [place] [address] [phone]                Time coordinating discharge:  35 minutes  Signed:   Estill Cotta M.D. [facility] 02/20/2021, 1:47 PM    Addendum: Patient was admitted with severe sepsis on 02/17/21, met SIRS criteria with tachycardia, febrile, tachypnea, hypoxia, leukocytosis and lactic acidosis, source due to H influenzae  pneumonia.   Estill Cotta M.D.  [facility]  02/25/2021, 4:07 PM

## 2021-02-20 NOTE — Progress Notes (Signed)
   02/19/21 2243  Assess: MEWS Score  BP (!) 203/109  Assess: MEWS Score  MEWS Temp 0  MEWS Systolic 2  MEWS Pulse 0  MEWS RR 0  MEWS LOC 0  MEWS Score 2  MEWS Score Color Yellow  Assess: if the MEWS score is Yellow or Red  Were vital signs taken at a resting state? Yes  Focused Assessment No change from prior assessment  Early Detection of Sepsis Score *See Row Information* Low  MEWS guidelines implemented *See Row Information* Yes  Treat  MEWS Interventions Administered prn meds/treatments;Escalated (See documentation below)  Take Vital Signs  Increase Vital Sign Frequency  Yellow: Q 2hr X 2 then Q 4hr X 2, if remains yellow, continue Q 4hrs  Escalate  MEWS: Escalate Yellow: discuss with charge nurse/RN and consider discussing with provider and RRT  Notify: Charge Nurse/RN  Name of Charge Nurse/RN Notified [place] RN  Date Charge Nurse/RN Notified 02/19/21  Time Charge Nurse/RN Notified 2245  Document  Patient Outcome Not stable and remains on department  Progress note created (see row info) Yes

## 2021-02-22 LAB — CULTURE, BLOOD (ROUTINE X 2)
Culture: NO GROWTH
Culture: NO GROWTH

## 2021-03-06 DIAGNOSIS — Z79899 Other long term (current) drug therapy: Secondary | ICD-10-CM | POA: Diagnosis not present

## 2021-03-06 DIAGNOSIS — F411 Generalized anxiety disorder: Secondary | ICD-10-CM | POA: Diagnosis not present

## 2021-03-06 DIAGNOSIS — G8929 Other chronic pain: Secondary | ICD-10-CM | POA: Diagnosis not present

## 2021-03-06 DIAGNOSIS — I5031 Acute diastolic (congestive) heart failure: Secondary | ICD-10-CM | POA: Diagnosis not present

## 2021-03-06 DIAGNOSIS — M549 Dorsalgia, unspecified: Secondary | ICD-10-CM | POA: Diagnosis not present

## 2021-03-06 DIAGNOSIS — Z6822 Body mass index (BMI) 22.0-22.9, adult: Secondary | ICD-10-CM | POA: Diagnosis not present

## 2021-03-06 DIAGNOSIS — J9621 Acute and chronic respiratory failure with hypoxia: Secondary | ICD-10-CM | POA: Diagnosis not present

## 2021-03-06 DIAGNOSIS — J189 Pneumonia, unspecified organism: Secondary | ICD-10-CM | POA: Diagnosis not present

## 2021-03-06 DIAGNOSIS — I10 Essential (primary) hypertension: Secondary | ICD-10-CM | POA: Diagnosis not present

## 2021-03-06 DIAGNOSIS — R5381 Other malaise: Secondary | ICD-10-CM | POA: Diagnosis not present

## 2021-03-14 DIAGNOSIS — G894 Chronic pain syndrome: Secondary | ICD-10-CM | POA: Diagnosis not present

## 2021-03-22 DIAGNOSIS — J441 Chronic obstructive pulmonary disease with (acute) exacerbation: Secondary | ICD-10-CM | POA: Diagnosis not present

## 2021-03-22 DIAGNOSIS — J189 Pneumonia, unspecified organism: Secondary | ICD-10-CM | POA: Diagnosis not present

## 2021-03-25 ENCOUNTER — Ambulatory Visit: Payer: PPO | Admitting: Pulmonary Disease

## 2021-03-25 ENCOUNTER — Other Ambulatory Visit: Payer: Self-pay

## 2021-03-25 ENCOUNTER — Encounter: Payer: Self-pay | Admitting: Pulmonary Disease

## 2021-03-25 VITALS — BP 132/70 | HR 76 | Temp 98.0°F | Ht 63.0 in | Wt 121.2 lb

## 2021-03-25 DIAGNOSIS — J9611 Chronic respiratory failure with hypoxia: Secondary | ICD-10-CM

## 2021-03-25 DIAGNOSIS — J432 Centrilobular emphysema: Secondary | ICD-10-CM | POA: Diagnosis not present

## 2021-03-25 NOTE — Progress Notes (Signed)
Synopsis: Referred in May 2022 for COPD  Subjective:   PATIENT ID: Dawn Beasley GENDER: female DOB: 10-Jan-1951, MRN: [id number]   HPI  Chief Complaint  Patient presents with  . Consult    COPD   Dawn Beasley is a 70 year old woman, former smoker with history of COPD who is referred to pulmonary clinic after recent hospitalization for respiratory failure.   She was admitted 4/3/ to 4/6 and was initially intubated due to poor mental status in setting of hypercapnic and hypoxemic respiratory failure in setting of Hemophilus influenza pneumonia and acute heart failure. She was discharged on 2L of supplemental oxygen due to desaturations with ambulation.   She is currently using breo ellipta daily. She denies wheezing or chest tightness.  She has history of blood clots. She has a 40 pack year history and quit smoking in March 2022.    Past Medical History:  Diagnosis Date  . AAA (abdominal aortic aneurysm) (HCC)   . Anxiety   . Arthritis   . Back pain   . Cervical disc herniation   . Depression   . 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 Hypercholesteremia   . Hypertension   . Panic disorder   . Stenosis, spinal, lumbar      Family History  Problem Relation Age of Onset  . Cancer Sister      Social History   Socioeconomic History  . Marital status: Married    Spouse name: Not on file  . Number of children: Not on file  . Years of education: Not on file  . Highest education level: Not on file  Occupational History  . Not on file  Tobacco Use  . Smoking status: Former Smoker    Packs/day: 1.00    Years: 40.00    Pack years: 40.00    Quit date: 02/10/2021    Years since quitting: 0.1  . Smokeless tobacco: Never Used  Vaping Use  . Vaping Use: Never used  Substance and Sexual Activity  . Alcohol use: Yes    Comment: OCCASIONAL  . Drug use: Yes    Types: Oxycodone  . Sexual activity: Not Currently    Partners: Male  Other Topics Concern  . Not on file  Social  History Narrative  . Not on file   Social Determinants of Health   Financial Resource Strain: Not on file  Food Insecurity: Not on file  Transportation Needs: Not on file  Physical Activity: Not on file  Stress: Not on file  Social Connections: Not on file  Intimate Partner Violence: Not on file     Allergies  Allergen Reactions  . Celebrex [Celecoxib] Swelling    Throat swelling  . Sulfa Antibiotics Swelling    Throat swelling     Outpatient Medications Prior to Visit  Medication Sig Dispense Refill  . acetaminophen (TYLENOL) 500 MG tablet Take 500 mg by mouth every 6 (six) hours as needed for moderate pain.    Marland Kitchen albuterol (VENTOLIN HFA) 108 (90 Base) MCG/ACT inhaler Inhale 2 puffs into the lungs every 4 (four) hours as needed for shortness of breath or wheezing.    Marland Kitchen ALPRAZolam (XANAX) 0.5 MG tablet Take 1 tablet (0.5 mg total) by mouth 2 (two) times daily as needed for anxiety. 20 tablet 0  . amLODipine (NORVASC) 5 MG tablet Take 1 tablet (5 mg total) by mouth daily. 30 tablet 3  . fluticasone furoate-vilanterol (BREO ELLIPTA) 100-25 MCG/INH AEPB Inhale 1 puff into the  lungs daily. 30 each 3  . fluticasone furoate-vilanterol (BREO ELLIPTA) 100-25 MCG/INH AEPB Inhale 1 puff into the lungs daily.    . furosemide (LASIX) 20 MG tablet Take 1 tablet (20 mg total) by mouth daily. 30 tablet 1  . gabapentin (NEURONTIN) 300 MG capsule Take 1 capsule (300 mg total) by mouth 3 (three) times daily.    Marland Kitchen. ibuprofen (ADVIL) 800 MG tablet Take 800 mg by mouth every 8 (eight) hours as needed for moderate pain.    Marland Kitchen. losartan (COZAAR) 100 MG tablet Take 1 tablet (100 mg total) by mouth daily. 30 tablet 3  . metoprolol tartrate (LOPRESSOR) 50 MG tablet Take 1 tablet (50 mg total) by mouth 2 (two) times daily. 60 tablet 3  . oxyCODONE-acetaminophen (PERCOCET) 10-325 MG tablet Take 1 tablet by mouth 4 (four) times daily as needed for pain.    . traZODone (DESYREL) 100 MG tablet Take 100-200 mg by  mouth at bedtime as needed for sleep.     No facility-administered medications prior to visit.    Review of Systems  Constitutional: Negative for chills, fever, malaise/fatigue and weight loss.  HENT: Negative for congestion, sinus pain and sore throat.   Eyes: Negative.   Respiratory: Positive for shortness of breath. Negative for cough, hemoptysis, sputum production and wheezing.   Cardiovascular: Negative for chest pain, palpitations, orthopnea, claudication and leg swelling.  Gastrointestinal: Negative for abdominal pain, heartburn, nausea and vomiting.  Genitourinary: Negative.   Musculoskeletal: Negative for joint pain and myalgias.  Skin: Negative for rash.  Neurological: Negative for weakness.  Endo/Heme/Allergies: Negative.   Psychiatric/Behavioral: The patient is nervous/anxious.       Objective:   Vitals:   03/25/21 1206  BP: 132/70  Pulse: 76  Temp: 98 F (36.7 C)  TempSrc: Oral  SpO2: 91%  Weight: 121 lb 3.2 oz (55 kg)  Height: 5\' 3"  (1.6 m)     Physical Exam Constitutional:      General: She is not in acute distress.    Appearance: She is normal weight. She is not ill-appearing.  HENT:     Head: Normocephalic and atraumatic.  Eyes:     General: No scleral icterus.    Conjunctiva/sclera: Conjunctivae normal.     Pupils: Pupils are equal, round, and reactive to light.  Cardiovascular:     Rate and Rhythm: Normal rate and regular rhythm.     Pulses: Normal pulses.     Heart sounds: Normal heart sounds. No murmur heard.   Pulmonary:     Effort: Pulmonary effort is normal.     Breath sounds: Normal breath sounds. Decreased air movement present. No wheezing, rhonchi or rales.  Abdominal:     General: Bowel sounds are normal.     Palpations: Abdomen is soft.  Musculoskeletal:     Right lower leg: No edema.     Left lower leg: No edema.  Lymphadenopathy:     Cervical: No cervical adenopathy.  Skin:    General: Skin is warm and dry.   Neurological:     General: No focal deficit present.     Mental Status: She is alert.  Psychiatric:        Mood and Affect: Mood normal.        Behavior: Behavior normal.        Thought Content: Thought content normal.        Judgment: Judgment normal.    CBC    Component Value Date/Time   WBC 10.6 (  H) 02/20/2021 0347   RBC 4.63 02/20/2021 0347   HGB 16.2 (H) 02/20/2021 0347   HCT 48.2 (H) 02/20/2021 0347   PLT 285 02/20/2021 0347   MCV 104.1 (H) 02/20/2021 0347   MCH 35.0 (H) 02/20/2021 0347   MCHC 33.6 02/20/2021 0347   RDW 13.0 02/20/2021 0347   LYMPHSABS 0.0 (L) 02/16/2021 2212   MONOABS 1.3 (H) 02/16/2021 2212   EOSABS 0.0 02/16/2021 2212   BASOSABS 0.0 02/16/2021 2212   BMP Latest Ref Rng & Units 02/20/2021 02/19/2021 02/18/2021  Glucose 70 - 99 mg/dL 417(E) 081(K) 92  BUN 8 - 23 mg/dL 48(J) 85(U) 23  Creatinine 0.44 - 1.00 mg/dL 3.14 9.70 2.63  Sodium 135 - 145 mmol/L 137 135 131(L)  Potassium 3.5 - 5.1 mmol/L 4.3 3.7 3.7  Chloride 98 - 111 mmol/L 96(L) 95(L) 91(L)  CO2 22 - 32 mmol/L 26 28 27   Calcium 8.9 - 10.3 mg/dL 9.6 9.5 9.1   Chest imaging: CTA Chest 02/17/21 Lungs/Pleura: Extensive tree-in-bud nodular densities and ground-glass densities throughout the lungs, likely infectious or inflammatory. No effusions. Bibasilar scarring.  PFT: No flowsheet data found.  Echo 02/18/2021: LV EF 60-65%. Normal function.  RV function is normal.   Moderate calcification of aortic valve.      Assessment & Plan:   Centrilobular emphysema (HCC) - Plan: Pulmonary function test  Chronic respiratory failure with hypoxia (HCC) - Plan: Ambulatory Referral for DME  Discussion: Dawn Beasley is a 70 year old woman, former smoker with history of COPD who is referred to pulmonary clinic after recent hospitalization for respiratory failure.   She is to continue 2L of oxygen with ambulation. We will place an order to her DME for evaluation of portable oxygen concentrator.   She  is to continue breo ellipta 1 puff daily and as needed albuterol. We can consider transitioning her to Trelegy or 73 based on her symptoms in the future.   She will need referral to our lung cancer program in the future. No nodules or masses noted on recent CT Chest scan on 02/17/21.   Return in 4 months for pulmonary function tests.  04/19/21, MD [facility] Office: [phone]   Current Outpatient Medications:  .  acetaminophen (TYLENOL) 500 MG tablet, Take 500 mg by mouth every 6 (six) hours as needed for moderate pain., Disp: , Rfl:  .  albuterol (VENTOLIN HFA) 108 (90 Base) MCG/ACT inhaler, Inhale 2 puffs into the lungs every 4 (four) hours as needed for shortness of breath or wheezing., Disp: , Rfl:  .  ALPRAZolam (XANAX) 0.5 MG tablet, Take 1 tablet (0.5 mg total) by mouth 2 (two) times daily as needed for anxiety., Disp: 20 tablet, Rfl: 0 .  amLODipine (NORVASC) 5 MG tablet, Take 1 tablet (5 mg total) by mouth daily., Disp: 30 tablet, Rfl: 3 .  fluticasone furoate-vilanterol (BREO ELLIPTA) 100-25 MCG/INH AEPB, Inhale 1 puff into the lungs daily., Disp: 30 each, Rfl: 3 .  fluticasone furoate-vilanterol (BREO ELLIPTA) 100-25 MCG/INH AEPB, Inhale 1 puff into the lungs daily., Disp: , Rfl:  .  furosemide (LASIX) 20 MG tablet, Take 1 tablet (20 mg total) by mouth daily., Disp: 30 tablet, Rfl: 1 .  gabapentin (NEURONTIN) 300 MG capsule, Take 1 capsule (300 mg total) by mouth 3 (three) times daily., Disp: , Rfl:  .  ibuprofen (ADVIL) 800 MG tablet, Take 800 mg by mouth every 8 (eight) hours as needed for moderate pain., Disp: , Rfl:  .  losartan (COZAAR) 100 MG tablet, Take 1 tablet (100 mg total) by mouth daily., Disp: 30 tablet, Rfl: 3 .  metoprolol tartrate (LOPRESSOR) 50 MG tablet, Take 1 tablet (50 mg total) by mouth 2 (two) times daily., Disp: 60 tablet, Rfl: 3 .  oxyCODONE-acetaminophen (PERCOCET) 10-325 MG tablet, Take 1 tablet by mouth 4 (four) times  daily as needed for pain., Disp: , Rfl:  .  traZODone (DESYREL) 100 MG tablet, Take 100-200 mg by mouth at bedtime as needed for sleep., Disp: , Rfl:

## 2021-03-25 NOTE — Patient Instructions (Signed)
Continue 2L of oxygen with walking/activity.   We will place an order to get you tested for a portable oxygen concentrator.   Continue breo ellipta 1 puff daily  Continue albuterol inhaler as needed  We will have you return in 4 months with pulmonary function tests

## 2021-04-02 DIAGNOSIS — J9621 Acute and chronic respiratory failure with hypoxia: Secondary | ICD-10-CM | POA: Diagnosis not present

## 2021-04-02 DIAGNOSIS — I10 Essential (primary) hypertension: Secondary | ICD-10-CM | POA: Diagnosis not present

## 2021-04-02 DIAGNOSIS — G8929 Other chronic pain: Secondary | ICD-10-CM | POA: Diagnosis not present

## 2021-04-02 DIAGNOSIS — M549 Dorsalgia, unspecified: Secondary | ICD-10-CM | POA: Diagnosis not present

## 2021-04-02 DIAGNOSIS — F17200 Nicotine dependence, unspecified, uncomplicated: Secondary | ICD-10-CM | POA: Diagnosis not present

## 2021-04-02 DIAGNOSIS — J439 Emphysema, unspecified: Secondary | ICD-10-CM | POA: Diagnosis not present

## 2021-04-02 DIAGNOSIS — Z6822 Body mass index (BMI) 22.0-22.9, adult: Secondary | ICD-10-CM | POA: Diagnosis not present

## 2021-04-02 DIAGNOSIS — F418 Other specified anxiety disorders: Secondary | ICD-10-CM | POA: Diagnosis not present

## 2021-04-26 DIAGNOSIS — Z6823 Body mass index (BMI) 23.0-23.9, adult: Secondary | ICD-10-CM | POA: Diagnosis not present

## 2021-04-26 DIAGNOSIS — I712 Thoracic aortic aneurysm, without rupture: Secondary | ICD-10-CM | POA: Diagnosis not present

## 2021-04-26 DIAGNOSIS — I700 Atherosclerosis of aorta: Secondary | ICD-10-CM | POA: Diagnosis not present

## 2021-04-26 DIAGNOSIS — K439 Ventral hernia without obstruction or gangrene: Secondary | ICD-10-CM | POA: Diagnosis not present

## 2021-04-26 DIAGNOSIS — M6208 Separation of muscle (nontraumatic), other site: Secondary | ICD-10-CM | POA: Diagnosis not present

## 2021-05-22 DIAGNOSIS — J189 Pneumonia, unspecified organism: Secondary | ICD-10-CM | POA: Diagnosis not present

## 2021-05-22 DIAGNOSIS — J441 Chronic obstructive pulmonary disease with (acute) exacerbation: Secondary | ICD-10-CM | POA: Diagnosis not present

## 2021-06-05 DIAGNOSIS — R197 Diarrhea, unspecified: Secondary | ICD-10-CM | POA: Diagnosis not present

## 2021-06-05 DIAGNOSIS — R140 Abdominal distension (gaseous): Secondary | ICD-10-CM | POA: Diagnosis not present

## 2021-06-05 DIAGNOSIS — K432 Incisional hernia without obstruction or gangrene: Secondary | ICD-10-CM | POA: Diagnosis not present

## 2021-06-05 DIAGNOSIS — M6208 Separation of muscle (nontraumatic), other site: Secondary | ICD-10-CM | POA: Diagnosis not present

## 2021-06-11 ENCOUNTER — Telehealth: Payer: Self-pay | Admitting: Gastroenterology

## 2021-06-11 NOTE — Telephone Encounter (Signed)
Left message for patient to call back  

## 2021-06-12 NOTE — Telephone Encounter (Signed)
Patient with continued vomiting and diarrhea.  She was asked to return to Dr. Russella Dar by Dr. Andrey Campanile from [facility].  Patient is scheduled for 06/26/21 with Dr. Russella Dar at 9:20

## 2021-06-22 DIAGNOSIS — J189 Pneumonia, unspecified organism: Secondary | ICD-10-CM | POA: Diagnosis not present

## 2021-06-22 DIAGNOSIS — J441 Chronic obstructive pulmonary disease with (acute) exacerbation: Secondary | ICD-10-CM | POA: Diagnosis not present

## 2021-06-26 ENCOUNTER — Ambulatory Visit (INDEPENDENT_AMBULATORY_CARE_PROVIDER_SITE_OTHER): Payer: PPO | Admitting: Gastroenterology

## 2021-06-26 ENCOUNTER — Encounter: Payer: Self-pay | Admitting: Gastroenterology

## 2021-06-26 VITALS — BP 120/70 | HR 75 | Ht 63.0 in | Wt 124.2 lb

## 2021-06-26 DIAGNOSIS — K2100 Gastro-esophageal reflux disease with esophagitis, without bleeding: Secondary | ICD-10-CM

## 2021-06-26 DIAGNOSIS — K581 Irritable bowel syndrome with constipation: Secondary | ICD-10-CM | POA: Diagnosis not present

## 2021-06-26 MED ORDER — PANTOPRAZOLE SODIUM 40 MG PO TBEC: 40.0000 mg | DELAYED_RELEASE_TABLET | Freq: Every day | ORAL | 3 refills | Status: AC

## 2021-06-26 NOTE — Patient Instructions (Addendum)
We have sent the following medications to your pharmacy for you to pick up at your convenience: pantoprazole.   Patient advised to avoid spicy, acidic, citrus, chocolate, mints, fruit and fruit juices.  Limit the intake of caffeine, alcohol and Soda.  Don't exercise too soon after eating.  Don't lie down within 3-4 hours of eating.  Elevate the head of your bed.  Start over the counter Miralax mixing 17 grams in 8 oz of water 1-3 x daily to titrate depending on your bowel movements.   Normal BMI (Body Mass Index- based on height and weight) is between 23 and 30. Your BMI today is Body mass index is 22.01 kg/m. Marland Kitchen Please consider follow up  regarding your BMI with your Primary Care Provider.  Thank you for choosing me and [facility].  Venita Lick. Pleas Koch., MD., Clementeen Graham

## 2021-06-26 NOTE — Progress Notes (Signed)
    History of Present Illness: This is a 70 year old female with nausea vomiting, bloating, constipation.  She was evaluated in February 2021 for an abnormal CT of the ileocecal valve and TI, chronic constipation, nausea, vomiting, weight loss.  She is accompanied by her husband.  She has ongoing difficulties with generalized abdominal bloating, generalized abdominal pain frequent regurgitation with nausea and vomiting.  She was evaluated by Dr. Gaynelle Adu on June 05, 2021 for an incisional hernia and diastases.  She is referred by Dr. Andrey Campanile for further evaluation of her gastrointestinal symptoms.  Dr. Jeannetta Nap her PCP has prescribed hyoscyamine which has provided modest symptom relief of her abdominal pain and bloating.  She takes milk of magnesia frequency frequently to manage constipation however it is not adequately controlled.  She has not been on any acid suppressants for several months. Colonoscopy to the TI 01/2020 showed a few small scattered erosions.  Path showed nonspecific colitis EGD 01/2020- LA Grade A reflux esophagitis with no bleeding, Gastritis. Biopsied and Erythematous duodenopathy. Biopsied.  Pathology revealed reactive gastropathy, H. pylori negative and peptic duodenitis.  Current Medications, Allergies, Past Medical History, Past Surgical History, Family History and Social History were reviewed in [employer] record.   Physical Exam: General: Well developed, well nourished, no acute distress Head: Normocephalic and atraumatic Eyes: Sclerae anicteric, EOMI Ears: Normal auditory acuity Mouth: Not examined, mask on during Covid-19 pandemic Lungs: Clear throughout to auscultation Heart: Regular rate and rhythm; no murmurs, rubs or bruits Abdomen: Soft, non tender and non distended. No masses, hepatosplenomegaly or hernias noted. Normal Bowel sounds Rectal: Not done Musculoskeletal: Symmetrical with no gross deformities  Pulses:  Normal pulses  noted Extremities: No clubbing, cyanosis, edema or deformities noted Neurological: Alert oriented x 4, grossly nonfocal Psychological:  Alert and cooperative. Normal mood and affect   Assessment and Recommendations:  GERD with LA Class A esophagitis, duodenitis, reactive gastropathy.  Closely follow antireflux measures long-term.  Begin pantoprazole 40 mg p.o. every morning long-term.  If symptoms are not adequately controlled add famotidine 40 mg every afternoon. REV in 6 weeks.  IBS-C.  Constipation is likely exacerbated by Percocet.  She is advised to begin MiraLAX 1-3 times per day on a daily basis to target for a complete bowel movement every day or every other day.  Continue hyoscyamine 1-2 every 6 hours as needed.  If this regimen is not effective consider trial of Trulance or Linzess. REV in 6 weeks.  Nonspecific colitis on prior colonoscopy.  The biopsies did not have typical features of IBD. Incisional hernia.  Diastases rectus.

## 2021-07-02 DIAGNOSIS — G894 Chronic pain syndrome: Secondary | ICD-10-CM | POA: Diagnosis not present

## 2021-07-09 DIAGNOSIS — F411 Generalized anxiety disorder: Secondary | ICD-10-CM | POA: Diagnosis not present

## 2021-07-09 DIAGNOSIS — G8929 Other chronic pain: Secondary | ICD-10-CM | POA: Diagnosis not present

## 2021-07-09 DIAGNOSIS — K581 Irritable bowel syndrome with constipation: Secondary | ICD-10-CM | POA: Diagnosis not present

## 2021-07-09 DIAGNOSIS — Z9181 History of falling: Secondary | ICD-10-CM | POA: Diagnosis not present

## 2021-07-09 DIAGNOSIS — J439 Emphysema, unspecified: Secondary | ICD-10-CM | POA: Diagnosis not present

## 2021-07-09 DIAGNOSIS — K2100 Gastro-esophageal reflux disease with esophagitis, without bleeding: Secondary | ICD-10-CM | POA: Diagnosis not present

## 2021-07-09 DIAGNOSIS — M549 Dorsalgia, unspecified: Secondary | ICD-10-CM | POA: Diagnosis not present

## 2021-07-09 DIAGNOSIS — I10 Essential (primary) hypertension: Secondary | ICD-10-CM | POA: Diagnosis not present

## 2021-07-09 DIAGNOSIS — Z139 Encounter for screening, unspecified: Secondary | ICD-10-CM | POA: Diagnosis not present

## 2021-07-09 DIAGNOSIS — I712 Thoracic aortic aneurysm, without rupture: Secondary | ICD-10-CM | POA: Diagnosis not present

## 2021-07-09 DIAGNOSIS — Z79899 Other long term (current) drug therapy: Secondary | ICD-10-CM | POA: Diagnosis not present

## 2021-07-09 DIAGNOSIS — I700 Atherosclerosis of aorta: Secondary | ICD-10-CM | POA: Diagnosis not present

## 2021-07-23 DIAGNOSIS — J441 Chronic obstructive pulmonary disease with (acute) exacerbation: Secondary | ICD-10-CM | POA: Diagnosis not present

## 2021-07-23 DIAGNOSIS — J189 Pneumonia, unspecified organism: Secondary | ICD-10-CM | POA: Diagnosis not present

## 2021-08-13 ENCOUNTER — Ambulatory Visit: Payer: PPO | Admitting: Gastroenterology

## 2021-08-22 DIAGNOSIS — J189 Pneumonia, unspecified organism: Secondary | ICD-10-CM | POA: Diagnosis not present

## 2021-08-22 DIAGNOSIS — J441 Chronic obstructive pulmonary disease with (acute) exacerbation: Secondary | ICD-10-CM | POA: Diagnosis not present

## 2021-09-22 DIAGNOSIS — J441 Chronic obstructive pulmonary disease with (acute) exacerbation: Secondary | ICD-10-CM | POA: Diagnosis not present

## 2021-09-22 DIAGNOSIS — J189 Pneumonia, unspecified organism: Secondary | ICD-10-CM | POA: Diagnosis not present

## 2021-10-14 DIAGNOSIS — J439 Emphysema, unspecified: Secondary | ICD-10-CM | POA: Diagnosis not present

## 2021-10-14 DIAGNOSIS — I700 Atherosclerosis of aorta: Secondary | ICD-10-CM | POA: Diagnosis not present

## 2021-10-14 DIAGNOSIS — M549 Dorsalgia, unspecified: Secondary | ICD-10-CM | POA: Diagnosis not present

## 2021-10-14 DIAGNOSIS — K2100 Gastro-esophageal reflux disease with esophagitis, without bleeding: Secondary | ICD-10-CM | POA: Diagnosis not present

## 2021-10-14 DIAGNOSIS — I10 Essential (primary) hypertension: Secondary | ICD-10-CM | POA: Diagnosis not present

## 2021-10-14 DIAGNOSIS — K581 Irritable bowel syndrome with constipation: Secondary | ICD-10-CM | POA: Diagnosis not present

## 2021-10-14 DIAGNOSIS — I7121 Aneurysm of the ascending aorta, without rupture: Secondary | ICD-10-CM | POA: Diagnosis not present

## 2021-10-14 DIAGNOSIS — G8929 Other chronic pain: Secondary | ICD-10-CM | POA: Diagnosis not present

## 2021-10-14 DIAGNOSIS — Z6825 Body mass index (BMI) 25.0-25.9, adult: Secondary | ICD-10-CM | POA: Diagnosis not present

## 2021-10-14 DIAGNOSIS — F411 Generalized anxiety disorder: Secondary | ICD-10-CM | POA: Diagnosis not present

## 2021-10-15 ENCOUNTER — Other Ambulatory Visit: Payer: Self-pay | Admitting: Physician Assistant

## 2021-10-15 DIAGNOSIS — I7121 Aneurysm of the ascending aorta, without rupture: Secondary | ICD-10-CM

## 2021-10-22 DIAGNOSIS — J189 Pneumonia, unspecified organism: Secondary | ICD-10-CM | POA: Diagnosis not present

## 2021-10-22 DIAGNOSIS — J441 Chronic obstructive pulmonary disease with (acute) exacerbation: Secondary | ICD-10-CM | POA: Diagnosis not present

## 2021-10-30 ENCOUNTER — Encounter: Payer: Self-pay | Admitting: Adult Health

## 2021-10-30 ENCOUNTER — Ambulatory Visit: Payer: PPO | Admitting: Adult Health

## 2021-10-30 ENCOUNTER — Other Ambulatory Visit: Payer: Self-pay

## 2021-10-30 VITALS — BP 154/91 | HR 93 | Ht 61.0 in | Wt 131.8 lb

## 2021-10-30 DIAGNOSIS — F324 Major depressive disorder, single episode, in partial remission: Secondary | ICD-10-CM | POA: Diagnosis not present

## 2021-10-30 DIAGNOSIS — F411 Generalized anxiety disorder: Secondary | ICD-10-CM | POA: Diagnosis not present

## 2021-10-30 DIAGNOSIS — G4700 Insomnia, unspecified: Secondary | ICD-10-CM

## 2021-10-30 DIAGNOSIS — F410 Panic disorder [episodic paroxysmal anxiety] without agoraphobia: Secondary | ICD-10-CM | POA: Diagnosis not present

## 2021-10-30 MED ORDER — ALPRAZOLAM 0.5 MG PO TABS
0.5000 mg | ORAL_TABLET | Freq: Three times a day (TID) | ORAL | 2 refills | Status: DC | PRN
Start: 2021-10-30 — End: 2022-01-02

## 2021-10-30 NOTE — Progress Notes (Signed)
[facility] MD/PA/NP Initial Note  10/30/2021 11:19 AM Dawn Beasley  MRN:  [id number]  Chief Complaint:   HPI:  Describes mood today as "ok". Pleasant. Denies tearfulness. Mood symptoms - denies depression. Feels irritable at times. Feels more anxious overall. Denies mood instability - has a history of highs and lows. History of overspending - "it makes you happy for a minute, then it's gone". Reports panic attacks. Currently taking Xanax 0.5mg  twice a day through PCP. Has taken Xanax since she was 70 years old. 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 Got pregnant during that time when she and husband were dating - they both decided to end the pregnancy. Stating after that I started on Xanax and have stayed on them over the years. Was followed by Dr Dub Mikes initially. He tried various medications that she did not tolerate and then switched her to Xanax and she has taken it since. Currently prescribed Xanax 0.5mg  BID through PCP, but overtakes it at times and goes into withdrawal. She and PCP have discussed an increase to prevent her running out and have decided to increase dose from 0.5mg  BID to TID. She feels like this dose will work well to manage her anxiety. Stable interest and motivation. Taking medications as prescribed.  Energy levels varies - "I have spurts". Active, has a regular exercise routine. Walking on the treadmill. Enjoys some usual interests and activities. Married. Lives with husband of 44 years - 2 children - 4 grandchildren. Spending time with family. Appetite adequate. Weight stable 131 pounds. Sleeping difficulties some nights. Averages 8 hours with 1mg  of Xanax.. Focus and concentration difficulties when it's time for her Xanax. Completing tasks. Managing aspects of household. Retired. at [employer].  Denies SI or HI.  Denies AH or VH.  Previous medication trials:  Xanax  Visit Diagnosis:    ICD-10-CM   1. Generalized anxiety disorder  F41.1 ALPRAZolam  (XANAX) 0.5 MG tablet    2. Panic attacks  F41.0 ALPRAZolam (XANAX) 0.5 MG tablet    3. Major depressive disorder in partial remission, unspecified whether recurrent (HCC)  F32.4 ALPRAZolam (XANAX) 0.5 MG tablet    4. Insomnia, unspecified type  G47.00 ALPRAZolam (XANAX) 0.5 MG tablet      Past Psychiatric History: Reports one psychiatric hospitalization - benzodiazepine withdrawl.   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   Hypercholesteremia    Hypertension    Panic disorder    Stenosis, spinal, lumbar     Past Surgical History:  Procedure Laterality Date   APPENDECTOMY     BACK SURGERY     CARPAL TUNNEL RELEASE     LUMBAR LAMINECTOMY/DECOMPRESSION MICRODISCECTOMY N/A 04/12/2014   Procedure: MICRO LUMBAR DECOMPRESSION LUMBAR THREE TO FOUR,LUMBAR  FOUR TO FIVE;  Surgeon: 04/14/2014, MD;  Location: [facility];  Service: Orthopedics;  Laterality: N/A;   MAXOFACIAL SURGERY     SPINAL CORD STIMULATOR INSERTION N/A 12/06/2015   Procedure: LUMBAR SPINAL CORD STIMULATOR INSERTION;  Surgeon: 12/08/2015, MD;  Location: [facility] OR;  Service: Orthopedics;  Laterality: N/A;    Family Psychiatric History: Denies any family history of mental illness.   Family History:  Family History  Problem Relation Age of Onset   Cancer Sister    Colon cancer  Neg Hx    Pancreatic cancer Neg Hx    Esophageal cancer Neg Hx    Liver disease Neg Hx    Stomach cancer Neg Hx     Social History:  Social History   Socioeconomic History   Marital status: Married    Spouse name: Not on file   Number of children: Not on file   Years of education: Not on file   Highest education level: Not on file  Occupational History   Not on file  Tobacco Use   Smoking status: Former    Packs/day: 1.00    Years: 40.00    Pack years: 40.00     Types: Cigarettes    Quit date: 02/10/2021    Years since quitting: 0.7    Passive exposure: Past   Smokeless tobacco: Never  Vaping Use   Vaping Use: Never used  Substance and Sexual Activity   Alcohol use: Yes    Comment: OCCASIONAL   Drug use: Yes    Types: Oxycodone   Sexual activity: Not Currently    Partners: Male  Other Topics Concern   Not on file  Social History Narrative   Not on file   Social Determinants of Health   Financial Resource Strain: Not on file  Food Insecurity: Not on file  Transportation Needs: Not on file  Physical Activity: Not on file  Stress: Not on file  Social Connections: Not on file    Allergies:  Allergies  Allergen Reactions   Celebrex [Celecoxib] Swelling    Throat swelling   Sulfa Antibiotics Swelling    Throat swelling    Metabolic Disorder Labs: No results found for: HGBA1C, MPG No results found for: PROLACTIN Lab Results  Component Value Date   TRIG 78 02/17/2021   Lab Results  Component Value Date   TSH 1.806 02/23/2020   TSH 0.275 (L) 02/12/2020    Therapeutic Level Labs: No results found for: LITHIUM No results found for: VALPROATE No components found for:  CBMZ  Current Medications: Current Outpatient Medications  Medication Sig Dispense Refill   albuterol (VENTOLIN HFA) 108 (90 Base) MCG/ACT inhaler Inhale 2 puffs into the lungs every 4 (four) hours as needed for shortness of breath or wheezing.     ALPRAZolam (XANAX) 0.5 MG tablet Take 1 tablet (0.5 mg total) by mouth 3 (three) times daily as needed for anxiety. 90 tablet 2   fluticasone furoate-vilanterol (BREO ELLIPTA) 100-25 MCG/INH AEPB Inhale 1 puff into the lungs daily. 30 each 3   gabapentin (NEURONTIN) 300 MG capsule Take 1 capsule (300 mg total) by mouth 3 (three) times daily.     hyoscyamine (ANASPAZ) 0.125 MG TBDP disintergrating tablet Take 0.125 mg by mouth every 6 (six) hours as needed.     ibuprofen (ADVIL) 800 MG tablet Take 800 mg by mouth  every 8 (eight) hours as needed for moderate pain.     losartan (COZAAR) 100 MG tablet Take 1 tablet (100 mg total) by mouth daily. 30 tablet 3   ondansetron (ZOFRAN-ODT) 4 MG disintegrating tablet Take 4 mg by mouth every 8 (eight) hours as needed.     oxyCODONE-acetaminophen (PERCOCET) 10-325 MG tablet Take 1 tablet by mouth 4 (four) times daily as needed for pain.     pantoprazole (PROTONIX) 40 MG tablet Take 1 tablet (40 mg total) by mouth daily. 90 tablet 3   traZODone (DESYREL) 100 MG tablet Take 100-200 mg by mouth at bedtime as needed for sleep.  No current facility-administered medications for this visit.    Medication Side Effects: none  Orders placed this visit:  No orders of the defined types were placed in this encounter.   Psychiatric Specialty Exam:  Review of Systems  Musculoskeletal:  Negative for gait problem.  Neurological:  Negative for tremors.  Psychiatric/Behavioral:         Please refer to HPI   Blood pressure (!) 154/91, pulse 93, height 5\' 1"  (1.549 m), weight 131 lb 12.8 oz (59.8 kg).Body mass index is 24.9 kg/m.  General Appearance: Casual and Neat  Eye Contact:  Good  Speech:  Clear and Coherent and Normal Rate  Volume:  Normal  Mood:  Euthymic  Affect:  Appropriate and Congruent  Thought Process:  Coherent and Descriptions of Associations: Intact  Orientation:  Full (Time, Place, and Person)  Thought Content: Logical   Suicidal Thoughts:  No  Homicidal Thoughts:  No  Memory:  WNL  Judgement:  Good  Insight:  Good  Psychomotor Activity:  Normal  Concentration:  Concentration: Good  Recall:  Good  Fund of Knowledge: Good  Language: Good  Assets:  Communication Skills Desire for Improvement Financial Resources/Insurance Housing Intimacy Leisure Time Physical Health Resilience Social Support Talents/Skills Transportation Vocational/Educational  ADL's:  Intact  Cognition: WNL  Prognosis:  Good   Screenings:  PHQ2-9    Flowsheet  Row Video Visit from 06/20/2020 in [facility] for Infectious Disease Video Visit from 05/23/2020 in [facility] for Infectious Disease Office Visit [facility]/25/[facility] in [facility]  PHQ-2 Total Score 0 0 0       Receiving Psychotherapy: Yes   Treatment Plan/Recommendations:  Plan:  PDMP reviewed  Trazadone 100mg  at betime Xanax 0.5mg  TID - will call when ready for a refill.  Will monitor patient over the next month for compliance with new prescription - has never taken three every day, but has days where she needs an extra dose. Advised patient to take medication as directed for the last script from the PA, or the new prescription would be discontinued. Patient agreed to be compliant.   Time spent with patient was 60 minutes. Greater than 50% of face to face time with patient was spent on counseling and coordination of care.    RTC 3 months  Patient advised to contact office with any questions, adverse effects, or acute worsening in signs and symptoms.      [facility], NP

## 2021-10-31 ENCOUNTER — Telehealth: Payer: Self-pay | Admitting: Adult Health

## 2021-10-31 NOTE — Telephone Encounter (Signed)
Not sure what she is talking about. Tried calling and got no answer. I see you sent in Rx for alprazolam yesterday.  She last had filled 11/24, so it was too early for another refill.

## 2021-10-31 NOTE — Telephone Encounter (Signed)
Sheyli returned your call.  She knows it is too early for a new prescription.  That's what she was just trying to relay.  She won't need any until January.

## 2021-10-31 NOTE — Telephone Encounter (Signed)
Noted  

## 2021-10-31 NOTE — Telephone Encounter (Signed)
Dawn Beasley saw you yesterday.  She said you were going to start prescribing her medications as of January.  She said that she can't picki up this months medications until12/17 not yesterday 12/14 as she told you. Now you will need to refill medications 1/16-17/23.  Just wanted you to be updated on this.

## 2021-11-21 ENCOUNTER — Ambulatory Visit
Admission: RE | Admit: 2021-11-21 | Discharge: 2021-11-21 | Disposition: A | Discharge status: Home / Self Care | Payer: PPO | Source: Ambulatory Visit | Attending: Physician Assistant | Admitting: Physician Assistant

## 2021-11-21 ENCOUNTER — Other Ambulatory Visit: Payer: Self-pay | Admitting: Physician Assistant

## 2021-11-21 ENCOUNTER — Other Ambulatory Visit: Payer: Self-pay

## 2021-11-21 DIAGNOSIS — K3189 Other diseases of stomach and duodenum: Secondary | ICD-10-CM | POA: Diagnosis not present

## 2021-11-21 DIAGNOSIS — I2510 Atherosclerotic heart disease of native coronary artery without angina pectoris: Secondary | ICD-10-CM | POA: Diagnosis not present

## 2021-11-21 DIAGNOSIS — I7121 Aneurysm of the ascending aorta, without rupture: Secondary | ICD-10-CM

## 2021-11-21 MED ORDER — IOPAMIDOL (ISOVUE-370) INJECTION 76%
75.0000 mL | Freq: Once | INTRAVENOUS | Status: AC | PRN
  Administered 2021-11-21: 75 mL via INTRAVENOUS

## 2021-11-22 DIAGNOSIS — J189 Pneumonia, unspecified organism: Secondary | ICD-10-CM | POA: Diagnosis not present

## 2021-11-22 DIAGNOSIS — J441 Chronic obstructive pulmonary disease with (acute) exacerbation: Secondary | ICD-10-CM | POA: Diagnosis not present

## 2021-11-25 ENCOUNTER — Telehealth: Payer: Self-pay | Admitting: Adult Health

## 2021-11-25 NOTE — Telephone Encounter (Signed)
LVM to rtc 

## 2021-11-25 NOTE — Telephone Encounter (Signed)
No increase in Xanax - discuss at next appt. Discuss sleep schedule and timing of medications.

## 2021-11-25 NOTE — Telephone Encounter (Signed)
Pt stated she also takes 3 trazodone 100 mg at bedtime.She is able to fall asleep but wakes up and is not able to go back to bed.

## 2021-11-25 NOTE — Telephone Encounter (Signed)
Pt LVM stating that since 12/17 she has been taking 3 Xanax a day but it runs out near bed time.  She wants to know if she can go to 4 pills per day.  Sunday, she woke up at 2am and was up the rest of the day.  Next appt 3/14

## 2021-11-26 NOTE — Telephone Encounter (Signed)
She takes trazodone at 9 pm.Takes last xanax at 8pm.Falls asleep at 10pm and up at around 2 am and sometimes can sleep until 5 am.

## 2021-11-26 NOTE — Telephone Encounter (Signed)
Please add to cancellation list

## 2021-11-26 NOTE — Telephone Encounter (Signed)
Noted  

## 2021-12-09 DIAGNOSIS — Z5181 Encounter for therapeutic drug level monitoring: Secondary | ICD-10-CM | POA: Diagnosis not present

## 2021-12-09 DIAGNOSIS — M5459 Other low back pain: Secondary | ICD-10-CM | POA: Diagnosis not present

## 2021-12-09 DIAGNOSIS — Z79891 Long term (current) use of opiate analgesic: Secondary | ICD-10-CM | POA: Diagnosis not present

## 2021-12-23 DIAGNOSIS — J189 Pneumonia, unspecified organism: Secondary | ICD-10-CM | POA: Diagnosis not present

## 2021-12-23 DIAGNOSIS — J441 Chronic obstructive pulmonary disease with (acute) exacerbation: Secondary | ICD-10-CM | POA: Diagnosis not present

## 2022-01-02 ENCOUNTER — Other Ambulatory Visit: Payer: Self-pay

## 2022-01-02 ENCOUNTER — Encounter: Payer: Self-pay | Admitting: Adult Health

## 2022-01-02 ENCOUNTER — Ambulatory Visit: Payer: PPO | Admitting: Adult Health

## 2022-01-02 DIAGNOSIS — F324 Major depressive disorder, single episode, in partial remission: Secondary | ICD-10-CM

## 2022-01-02 DIAGNOSIS — F410 Panic disorder [episodic paroxysmal anxiety] without agoraphobia: Secondary | ICD-10-CM

## 2022-01-02 DIAGNOSIS — F411 Generalized anxiety disorder: Secondary | ICD-10-CM

## 2022-01-02 DIAGNOSIS — G4700 Insomnia, unspecified: Secondary | ICD-10-CM | POA: Diagnosis not present

## 2022-01-02 MED ORDER — TRAZODONE HCL 100 MG PO TABS: 100.0000 mg | ORAL_TABLET | Freq: Every evening | ORAL | 5 refills | Status: AC | PRN

## 2022-01-02 MED ORDER — ALPRAZOLAM 0.5 MG PO TABS: ORAL_TABLET | ORAL | 2 refills | Status: AC

## 2022-01-02 NOTE — Progress Notes (Signed)
Dawn Beasley [id number] 11/10/1951 71 y.o.  Subjective:   Patient ID:  Dawn Beasley is a 71 y.o. (DOB June 20, 1951) female.  Chief Complaint: No chief complaint on file.   HPI Dawn Beasley presents to the office today for follow-up of GAD, MDD, panic attacks, and insomnia.  Describes mood today as "ok". Pleasant. Denies tearfulness. Mood symptoms - denies depression and irritability. Feels more anxious overall. Denies mood instability - mood consistent. Stating "I am doing much better". She and husband doing. Involved in church. Stable interest and motivation. Taking medications as prescribed.  Energy levels stable. Active, has a regular exercise routine. Walking on the treadmill. Enjoys some usual interests and activities. Married. Lives with husband of 44 years - 2 children - 4 grandchildren. Spending time with family. Appetite adequate. Weight stable 130 pounds. Sleeping difficulties some nights. Averages 6 hours. Reports a daytime nap for 1 hour. Focus and concentration mostly stable - "much better than it was". Completing tasks. Managing aspects of household. Retired. Agricultural consultant at [employer].  Denies SI or HI.  Denies AH or VH. Upcoming appt with PCP.  Previous medication trials:  Xanax  PHQ2-9    Flowsheet Row Video Visit from 06/20/2020 in [facility] for Infectious Disease Video Visit from 05/23/2020 in [facility] for Infectious Disease Office Visit from 04/25/2020 in [facility] for Infectious Disease  PHQ-2 Total Score 0 0 0        Review of Systems:  Review of Systems  Musculoskeletal:  Negative for gait problem.  Neurological:  Negative for tremors.  Psychiatric/Behavioral:         Please refer to HPI   Medications: I have reviewed the patient's current medications.  Current Outpatient Medications  Medication Sig Dispense Refill   albuterol (VENTOLIN HFA) 108 (90 Base) MCG/ACT inhaler Inhale 2 puffs into the lungs every 4  (four) hours as needed for shortness of breath or wheezing.     ALPRAZolam (XANAX) 0.5 MG tablet Take one tablet four times daily for anxiety and panic attacks. 120 tablet 2   fluticasone furoate-vilanterol (BREO ELLIPTA) 100-25 MCG/INH AEPB Inhale 1 puff into the lungs daily. 30 each 3   gabapentin (NEURONTIN) 300 MG capsule Take 1 capsule (300 mg total) by mouth 3 (three) times daily.     hyoscyamine (ANASPAZ) 0.125 MG TBDP disintergrating tablet Take 0.125 mg by mouth every 6 (six) hours as needed.     ibuprofen (ADVIL) 800 MG tablet Take 800 mg by mouth every 8 (eight) hours as needed for moderate pain.     losartan (COZAAR) 100 MG tablet Take 1 tablet (100 mg total) by mouth daily. 30 tablet 3   ondansetron (ZOFRAN-ODT) 4 MG disintegrating tablet Take 4 mg by mouth every 8 (eight) hours as needed.     oxyCODONE-acetaminophen (PERCOCET) 10-325 MG tablet Take 1 tablet by mouth 4 (four) times daily as needed for pain.     pantoprazole (PROTONIX) 40 MG tablet Take 1 tablet (40 mg total) by mouth daily. 90 tablet 3   traZODone (DESYREL) 100 MG tablet Take 1-2 tablets (100-200 mg total) by mouth at bedtime as needed for sleep. 30 tablet 5   No current facility-administered medications for this visit.    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   Please see review of systems for further details on the patient's review from today.   Objective:   Physical Exam:   There were no vitals taken for this visit.  Physical Exam Constitutional:      General: She is not in acute distress. Musculoskeletal:        General: No deformity.  Neurological:     Mental Status: She is alert and oriented to person, place, and time.     Coordination: Coordination normal.  Psychiatric:        Attention and Perception: Attention and perception normal. She does not perceive auditory or visual hallucinations.        Mood and Affect: Mood normal. Mood is not anxious or depressed. Affect is not labile, blunt, angry or inappropriate.        Speech: Speech normal.        Behavior: Behavior normal.        Thought Content: Thought content normal. Thought content is not paranoid or delusional. Thought content does not include homicidal or suicidal ideation. Thought content does not include homicidal or suicidal plan.        Cognition and Memory: Cognition and memory normal.        Judgment: Judgment normal.     Comments: Insight intact    Lab Review:     Component Value Date/Time   NA 137 02/20/2021 0347   K 4.3 02/20/2021 0347   CL 96 (L) 02/20/2021 0347   CO2 26 02/20/2021 0347   GLUCOSE 124 (H) 02/20/2021 0347   BUN 39 (H) 02/20/2021 0347   CREATININE 0.76 02/20/2021 0347   CALCIUM 9.6 02/20/2021 0347   PROT 7.2 02/16/2021 2212   ALBUMIN 3.5 02/16/2021 2212   AST 24 02/16/2021 2212   ALT 14 02/16/2021 2212   ALKPHOS 77 02/16/2021 2212   BILITOT 0.9 02/16/2021 2212   GFRNONAA >60 02/20/2021 0347   GFRAA >60 03/08/2020 0657       Component Value Date/Time   WBC 10.6 (H) 02/20/2021 0347   RBC 4.63 02/20/2021 0347   HGB 16.2 (H) 02/20/2021 0347   HCT 48.2 (H) 02/20/2021 0347   PLT 285 02/20/2021 0347   MCV 104.1 (H) 02/20/2021 0347   MCH 35.0 (H) 02/20/2021 0347   MCHC 33.6 02/20/2021 0347   RDW 13.0 02/20/2021 0347   LYMPHSABS 0.0 (L) 02/16/2021 2212   MONOABS 1.3 (H) 02/16/2021 2212   EOSABS 0.0 02/16/2021 2212   BASOSABS 0.0 02/16/2021 2212     No results found for: POCLITH, LITHIUM   No results found for: PHENYTOIN, PHENOBARB, VALPROATE, CBMZ   .res Assessment: Plan:    Plan:  PDMP reviewed  Trazadone 100mg  at betime Xanax 0.5mg  TID - will call when ready for a refill.  Will monitor patient over the next month for compliance with new prescription - has never taken three every day, but has days where she needs an extra dose. Advised patient to take medication as directed for the last script from the PA, or the new prescription would be  discontinued. Patient agreed to be compliant.   Time spent with patient was 60 minutes. Greater than 50% of face to face time with patient was spent on counseling and coordination of care.    RTC 4 months  Patient advised to contact office with any questions, adverse effects, or acute worsening in signs and symptoms.   Diagnoses and all orders for this visit:  Generalized anxiety disorder -     ALPRAZolam (XANAX) 0.5 MG tablet; Take one tablet four times daily for anxiety and panic attacks.  Panic attacks -     ALPRAZolam (XANAX) 0.5 MG tablet; Take one tablet four times daily for anxiety and panic attacks.  Major depressive disorder in partial remission, unspecified whether recurrent (HCC) -     ALPRAZolam (XANAX) 0.5 MG tablet; Take one tablet four times daily for anxiety and panic attacks.  Insomnia, unspecified type -     ALPRAZolam (XANAX) 0.5 MG tablet; Take one tablet four times daily for anxiety and panic attacks. -     traZODone (DESYREL) 100 MG tablet; Take 1-2 tablets (100-200 mg total) by mouth at bedtime as needed for sleep.     Please see After Visit Summary for patient specific instructions.  No future appointments.   No orders of the defined types were placed in this encounter.   -------------------------------

## 2022-01-15 DIAGNOSIS — Z6825 Body mass index (BMI) 25.0-25.9, adult: Secondary | ICD-10-CM | POA: Diagnosis not present

## 2022-01-15 DIAGNOSIS — I7121 Aneurysm of the ascending aorta, without rupture: Secondary | ICD-10-CM | POA: Diagnosis not present

## 2022-01-15 DIAGNOSIS — F411 Generalized anxiety disorder: Secondary | ICD-10-CM | POA: Diagnosis not present

## 2022-01-15 DIAGNOSIS — I700 Atherosclerosis of aorta: Secondary | ICD-10-CM | POA: Diagnosis not present

## 2022-01-15 DIAGNOSIS — K581 Irritable bowel syndrome with constipation: Secondary | ICD-10-CM | POA: Diagnosis not present

## 2022-01-15 DIAGNOSIS — J439 Emphysema, unspecified: Secondary | ICD-10-CM | POA: Diagnosis not present

## 2022-01-15 DIAGNOSIS — E7800 Pure hypercholesterolemia, unspecified: Secondary | ICD-10-CM | POA: Diagnosis not present

## 2022-01-15 DIAGNOSIS — K2100 Gastro-esophageal reflux disease with esophagitis, without bleeding: Secondary | ICD-10-CM | POA: Diagnosis not present

## 2022-01-15 DIAGNOSIS — G8929 Other chronic pain: Secondary | ICD-10-CM | POA: Diagnosis not present

## 2022-01-15 DIAGNOSIS — M549 Dorsalgia, unspecified: Secondary | ICD-10-CM | POA: Diagnosis not present

## 2022-01-15 DIAGNOSIS — J9611 Chronic respiratory failure with hypoxia: Secondary | ICD-10-CM | POA: Diagnosis not present

## 2022-01-15 DIAGNOSIS — I10 Essential (primary) hypertension: Secondary | ICD-10-CM | POA: Diagnosis not present

## 2022-01-16 ENCOUNTER — Ambulatory Visit: Payer: PPO | Admitting: Pulmonary Disease

## 2022-01-20 DIAGNOSIS — J189 Pneumonia, unspecified organism: Secondary | ICD-10-CM | POA: Diagnosis not present

## 2022-01-20 DIAGNOSIS — J441 Chronic obstructive pulmonary disease with (acute) exacerbation: Secondary | ICD-10-CM | POA: Diagnosis not present

## 2022-01-28 ENCOUNTER — Ambulatory Visit: Payer: PPO | Admitting: Adult Health

## 2022-01-30 ENCOUNTER — Ambulatory Visit: Payer: PPO | Admitting: Pulmonary Disease

## 2022-01-30 ENCOUNTER — Telehealth: Payer: Self-pay | Admitting: Pulmonary Disease

## 2022-01-30 ENCOUNTER — Encounter: Payer: Self-pay | Admitting: Pulmonary Disease

## 2022-01-30 ENCOUNTER — Other Ambulatory Visit: Payer: Self-pay

## 2022-01-30 VITALS — BP 128/82 | HR 88 | Ht 61.0 in | Wt 135.6 lb

## 2022-01-30 DIAGNOSIS — F17210 Nicotine dependence, cigarettes, uncomplicated: Secondary | ICD-10-CM

## 2022-01-30 DIAGNOSIS — J432 Centrilobular emphysema: Secondary | ICD-10-CM | POA: Diagnosis not present

## 2022-01-30 DIAGNOSIS — J9611 Chronic respiratory failure with hypoxia: Secondary | ICD-10-CM | POA: Diagnosis not present

## 2022-01-30 MED ORDER — VARENICLINE TARTRATE 0.5 MG X 11 & 1 MG X 42 PO TBPK: ORAL_TABLET | ORAL | 0 refills | Status: AC

## 2022-01-30 NOTE — Patient Instructions (Addendum)
Continue breo ellipta 1 puff daily ?- rinse mouth out as needed ? ?Start Chantix starter pack, follow directions in increasing your dose over the first week.  ? ?Use nicotine patch 7mg  daily ? ?Use nicotine mini lozenges as needed ? ?We will send in order to DME company to evaluate you for portable oxygen concentrator.  ? ?Follow up in 6 months with pulmonary function tests ? ? ?

## 2022-01-30 NOTE — Addendum Note (Signed)
Addended by: Maurene Capes on: 01/30/2022 11:32 AM ? ? Modules accepted: Orders ? ?

## 2022-01-30 NOTE — Progress Notes (Signed)
? ?Synopsis: Referred in May 2022 for COPD ? ?Subjective:  ? ?PATIENT ID: Dawn Beasley GENDER: female DOB: 1951-04-16, MRN: [id number] ? ? ?HPI ? ?Chief Complaint  ?Patient presents with  ? Follow-up  ?  F/U for emphysema. States her breathing had stable up until this past Sunday. Her O2 dropped to low 50s, had to use 6L of O2 to recover.   ? ?Dawn Beasley is a 71 year old woman, former smoker with history of COPD who returns to pulmonary for follow up of COPD.  ? ?She has been on breo ellipta 1 puff daily and has been doing well since last visit. ? ?Using 2 L of supplemental oxygen at night and is using oxygen as needed during the day.  She reports episodes of extended periods of activity without using oxygen with noticeable oxygen desaturations.  Most recently she had her family over for dinner where she had a brief period appearing altered and short of breath and was noted to have oxygen saturations in the 50s.  Patient on her supplemental oxygen and titrated up to 6 L with improvement of her oxygen and mental status. ? ?She continues to smoke 4 cigarettes/day. ? ?Oxygen Supply Company: [employer]  ? ?OV 03/25/21 ?She was admitted 4/3/ to 4/6 and was initially intubated due to poor mental status in setting of hypercapnic and hypoxemic respiratory failure in setting of Hemophilus influenza pneumonia and acute heart failure. She was discharged on 2L of supplemental oxygen due to desaturations with ambulation.  ? ?She is currently using breo ellipta daily. She denies wheezing or chest tightness. ? ?She has history of blood clots. She has a 40 pack year history and quit smoking in March 2022.  ? ? ?Past Medical History:  ?Diagnosis Date  ? AAA (abdominal aortic aneurysm)   ? Anxiety   ? Arthritis   ? Back pain   ? Cervical disc herniation   ? Depression   ? 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 Hypercholesteremia   ? Hypertension   ? Panic disorder   ? Stenosis, spinal, lumbar   ?  ? ?Family History  ?Problem Relation Age of  Onset  ? Cancer Sister   ? Colon cancer Neg Hx   ? Pancreatic cancer Neg Hx   ? Esophageal cancer Neg Hx   ? Liver disease Neg Hx   ? Stomach cancer Neg Hx   ?  ? ?Social History  ? ?Socioeconomic History  ? Marital status: Married  ?  Spouse name: Not on file  ? Number of children: Not on file  ? Years of education: Not on file  ? Highest education level: Not on file  ?Occupational History  ? Not on file  ?Tobacco Use  ? Smoking status: Former  ?  Packs/day: 1.00  ?  Years: 40.00  ?  Pack years: 40.00  ?  Types: Cigarettes  ?  Quit date: 02/10/2021  ?  Years since quitting: 0.9  ?  Passive exposure: Past  ? Smokeless tobacco: Never  ?Vaping Use  ? Vaping Use: Never used  ?Substance and Sexual Activity  ? Alcohol use: Yes  ?  Comment: OCCASIONAL  ? Drug use: Yes  ?  Types: Oxycodone  ? Sexual activity: Not Currently  ?  Partners: Male  ?Other Topics Concern  ? Not on file  ?Social History Narrative  ? Not on file  ? ?Social Determinants of Health  ? ?Financial Resource Strain: Not on file  ?Food Insecurity: Not  on file  ?Transportation Needs: Not on file  ?Physical Activity: Not on file  ?Stress: Not on file  ?Social Connections: Not on file  ?Intimate Partner Violence: Not on file  ?  ? ?Allergies  ?Allergen Reactions  ? Celebrex [Celecoxib] Swelling  ?  Throat swelling  ? Sulfa Antibiotics Swelling  ?  Throat swelling  ?  ? ?Outpatient Medications Prior to Visit  ?Medication Sig Dispense Refill  ? albuterol (VENTOLIN HFA) 108 (90 Base) MCG/ACT inhaler Inhale 2 puffs into the lungs every 4 (four) hours as needed for shortness of breath or wheezing.    ? ALPRAZolam (XANAX) 0.5 MG tablet Take one tablet four times daily for anxiety and panic attacks. 120 tablet 2  ? fluticasone furoate-vilanterol (BREO ELLIPTA) 100-25 MCG/INH AEPB Inhale 1 puff into the lungs daily. 30 each 3  ? furosemide (LASIX) 20 MG tablet Take 20 mg by mouth daily.    ? gabapentin (NEURONTIN) 300 MG capsule Take 1 capsule (300 mg total) by mouth  3 (three) times daily.    ? hyoscyamine (ANASPAZ) 0.125 MG TBDP disintergrating tablet Take 0.125 mg by mouth every 6 (six) hours as needed.    ? ibuprofen (ADVIL) 800 MG tablet Take 800 mg by mouth every 8 (eight) hours as needed for moderate pain.    ? losartan (COZAAR) 100 MG tablet Take 1 tablet (100 mg total) by mouth daily. 30 tablet 3  ? oxyCODONE-acetaminophen (PERCOCET) 10-325 MG tablet Take 1 tablet by mouth 4 (four) times daily as needed for pain.    ? pantoprazole (PROTONIX) 40 MG tablet Take 1 tablet (40 mg total) by mouth daily. 90 tablet 3  ? rosuvastatin (CRESTOR) 10 MG tablet Take 10 mg by mouth daily.    ? traZODone (DESYREL) 100 MG tablet Take 1-2 tablets (100-200 mg total) by mouth at bedtime as needed for sleep. 30 tablet 5  ? ondansetron (ZOFRAN-ODT) 4 MG disintegrating tablet Take 4 mg by mouth every 8 (eight) hours as needed.    ? ?No facility-administered medications prior to visit.  ? ?Review of Systems  ?Constitutional:  Negative for chills, fever, malaise/fatigue and weight loss.  ?HENT:  Negative for congestion, sinus pain and sore throat.   ?Eyes: Negative.   ?Respiratory:  Positive for shortness of breath. Negative for cough, hemoptysis, sputum production and wheezing.   ?Cardiovascular:  Negative for chest pain, palpitations, orthopnea, claudication and leg swelling.  ?Gastrointestinal:  Negative for abdominal pain, heartburn, nausea and vomiting.  ?Genitourinary: Negative.   ?Musculoskeletal:  Negative for joint pain and myalgias.  ?Skin:  Negative for rash.  ?Neurological:  Negative for weakness.  ?Endo/Heme/Allergies: Negative.   ?Psychiatric/Behavioral:  The patient is nervous/anxious.   ? ?Objective:  ? ?Vitals:  ? 01/30/22 0952  ?BP: 128/82  ?Pulse: 88  ?SpO2: 92%  ?Weight: 135 lb 9.6 oz (61.5 kg)  ?Height: 5\' 1"  (1.549 m)  ? ?Physical Exam ?Constitutional:   ?   General: She is not in acute distress. ?   Appearance: She is normal weight. She is not ill-appearing.  ?HENT:  ?    Head: Normocephalic and atraumatic.  ?Eyes:  ?   General: No scleral icterus. ?   Conjunctiva/sclera: Conjunctivae normal.  ?Cardiovascular:  ?   Rate and Rhythm: Normal rate and regular rhythm.  ?   Pulses: Normal pulses.  ?   Heart sounds: Normal heart sounds. No murmur heard. ?Pulmonary:  ?   Effort: Pulmonary effort is normal.  ?  Breath sounds: Normal breath sounds. Decreased air movement present. No wheezing, rhonchi or rales.  ?Musculoskeletal:  ?   Right lower leg: No edema.  ?   Left lower leg: No edema.  ?Skin: ?   General: Skin is warm and dry.  ?Neurological:  ?   General: No focal deficit present.  ?   Mental Status: She is alert.  ?Psychiatric:     ?   Mood and Affect: Mood normal.     ?   Behavior: Behavior normal.     ?   Thought Content: Thought content normal.     ?   Judgment: Judgment normal.  ? ?CBC ?   ?Component Value Date/Time  ? WBC 10.6 (H) 02/20/2021 0347  ? RBC 4.63 02/20/2021 0347  ? HGB 16.2 (H) 02/20/2021 0347  ? HCT 48.2 (H) 02/20/2021 0347  ? PLT 285 02/20/2021 0347  ? MCV 104.1 (H) 02/20/2021 0347  ? MCH 35.0 (H) 02/20/2021 0347  ? MCHC 33.6 02/20/2021 0347  ? RDW 13.0 02/20/2021 0347  ? LYMPHSABS 0.0 (L) 02/16/2021 2212  ? MONOABS 1.3 (H) 02/16/2021 2212  ? EOSABS 0.0 02/16/2021 2212  ? BASOSABS 0.0 02/16/2021 2212  ? ?BMP Latest Ref Rng & Units 02/20/2021 02/19/2021 02/18/2021  ?Glucose 70 - 99 mg/dL 676(H) 209(O) 92  ?BUN 8 - 23 mg/dL 70(J) 62(E) 23  ?Creatinine 0.44 - 1.00 mg/dL 3.66 2.94 7.65  ?Sodium 135 - 145 mmol/L 137 135 131(L)  ?Potassium 3.5 - 5.1 mmol/L 4.3 3.7 3.7  ?Chloride 98 - 111 mmol/L 96(L) 95(L) 91(L)  ?CO2 22 - 32 mmol/L 26 28 27   ?Calcium 8.9 - 10.3 mg/dL 9.6 9.5 9.1  ? ?Chest imaging: ?CTA Chest 02/17/21 ?Lungs/Pleura: Extensive tree-in-bud nodular densities and ?ground-glass densities throughout the lungs, likely infectious or ?inflammatory. No effusions. Bibasilar scarring. ? ?PFT: ?No flowsheet data found. ? ?Echo 02/18/2021: ?LV EF 60-65%. Normal function.  ?RV  function is normal.   ?Moderate calcification of aortic valve.  ? ?Assessment & Plan:  ? ?Centrilobular emphysema (HCC) - Plan: Pulmonary Function Test ? ?Cigarette smoker - Plan: Ambulatory Referral for

## 2022-01-31 NOTE — Telephone Encounter (Signed)
Called patient and she states that after talking it over and doing some research she would like to try [facility]. She states that there are too many side effect of the CHANTIX for her.  ? ?Dr Francine Graven are you ok with [place] placing the order for the Wellbutrin for this patient. ? ?Please advise  ?

## 2022-02-03 NOTE — Telephone Encounter (Signed)
Yes, ok to send in wellbutrin prescription. Please cancel chantix prescription. ? ?Thanks, ?JD ?

## 2022-02-04 MED ORDER — BUPROPION HCL ER (SR) 150 MG PO TB12: 150.0000 mg | ORAL_TABLET | Freq: Two times a day (BID) | ORAL | 6 refills | Status: DC

## 2022-02-04 NOTE — Telephone Encounter (Signed)
Called and spoke with [place] to let her know RX was sent in. She expressed understanding. Nothing further needed at this time.  ?

## 2022-02-04 NOTE — Telephone Encounter (Signed)
Prescription sent in to pharmacy.  ? ?Patient instructions: ?Take 1 tablet daily for the first 7 days, then increase to 1 tablet twice daily.  ? ?Thanks, ?JD ?

## 2022-02-04 NOTE — Telephone Encounter (Signed)
Lm for patient.  

## 2022-02-04 NOTE — Telephone Encounter (Signed)
Dr. Francine Graven, please verify Wellbutrin dosage? Thanks ?

## 2022-02-10 ENCOUNTER — Ambulatory Visit: Payer: PPO | Admitting: Pulmonary Disease

## 2022-02-20 ENCOUNTER — Telehealth: Payer: Self-pay | Admitting: Pulmonary Disease

## 2022-02-21 MED ORDER — FLUTICASONE FUROATE-VILANTEROL 100-25 MCG/ACT IN AEPB: 1.0000 | INHALATION_SPRAY | Freq: Every day | RESPIRATORY_TRACT | 0 refills | Status: AC

## 2022-02-21 NOTE — Telephone Encounter (Signed)
Dawn Beasley the daughter and informed her that I printed out the order and will have Dr Erin Fulling sign it and we will fax it over to [place]. Nothing further needed  ?

## 2022-02-26 ENCOUNTER — Other Ambulatory Visit: Payer: Self-pay | Admitting: Pulmonary Disease

## 2022-02-26 DIAGNOSIS — F17210 Nicotine dependence, cigarettes, uncomplicated: Secondary | ICD-10-CM

## 2022-02-26 NOTE — Telephone Encounter (Signed)
Dr. Dewald, please advise on this for pt. 

## 2022-02-27 ENCOUNTER — Encounter: Payer: Self-pay | Admitting: Pulmonary Disease

## 2022-02-28 NOTE — Telephone Encounter (Signed)
Attempted to call patient due to the nature of the MyChart message but she did not answer. Left message for her to call us back. Will check again on Monday.  ?

## 2022-03-12 ENCOUNTER — Telehealth: Payer: Self-pay | Admitting: Pulmonary Disease

## 2022-03-12 NOTE — Telephone Encounter (Signed)
Attempted to call pt but unable to reach. Left message for her to return call. 

## 2022-03-17 DIAGNOSIS — M545 Low back pain, unspecified: Secondary | ICD-10-CM | POA: Diagnosis not present

## 2022-03-17 DIAGNOSIS — Z79891 Long term (current) use of opiate analgesic: Secondary | ICD-10-CM | POA: Diagnosis not present

## 2022-03-18 ENCOUNTER — Telehealth: Payer: Self-pay | Admitting: Pulmonary Disease

## 2022-03-18 NOTE — Telephone Encounter (Signed)
Called patient and left voicemail for her to call office back. Unsure of what she needs. But last message was about her POC device.  ?

## 2022-03-18 NOTE — Telephone Encounter (Signed)
Called the pt and there was no answer- LMTCB    

## 2022-04-03 NOTE — Telephone Encounter (Signed)
See encounter from 5/2.

## 2022-04-07 ENCOUNTER — Telehealth: Payer: Self-pay | Admitting: Pulmonary Disease

## 2022-04-07 NOTE — Telephone Encounter (Signed)
Called and spoke with pt letting her know that we needed to have an OV scheduled so we could be able to better address POC and her O2 needs. Pt verbalized understanding. Stated she would need to call to see when she could have a ride and then would call office back to schedule appt. Nothing further needed.

## 2022-04-07 NOTE — Telephone Encounter (Signed)
ATC [employer] to figure out the issues with the oxygen order that was placed on 3/16. Pt was walked in office on 3/16.  Called daughter in law and she states that if her mother in law needs to come back in and rewalk she can get her back in the office. She states when she is off the oxygen she does drop in the 60s. Her mother in law does have a big oxygen concentrator but she was wanting smaller tanks so she can get out of the house.   Will call [employer] again tomorrow.

## 2022-04-11 NOTE — Telephone Encounter (Signed)
Called and spoke with [place] who states that the patient passed away on 03-05-23. Expressed our condolences and advised her to call if she needs anything. She asked what to do about patient's oxygen. Advised her to call [employer] so they can arrange schedules with what works best for them to come and pick up everything. She said she had their number and would give them a call. Dr. Francine Graven has been notified that patient passed away. Nothing further needed at this time.

## 2022-04-11 NOTE — Telephone Encounter (Signed)
Called [place] but she did not answer. Left message for her to call us back.

## 2022-04-11 NOTE — Telephone Encounter (Signed)
[place] daughter in law is returning phone call. [place] phone number is [phone].

## 2022-04-17 DEATH — deceased

## 2022-05-02 ENCOUNTER — Ambulatory Visit: Payer: PPO | Admitting: Adult Health
